# Patient Record
Sex: Female | Born: 1980
Health system: Southern US, Community
[De-identification: ages and names within clinical notes are randomized; demographics above are authoritative.]

## PROBLEM LIST (undated history)

## (undated) ENCOUNTER — Inpatient Hospital Stay (HOSPITAL_COMMUNITY): Payer: Self-pay

## (undated) DIAGNOSIS — I2690 Septic pulmonary embolism without acute cor pulmonale: Secondary | ICD-10-CM

## (undated) DIAGNOSIS — F32A Depression, unspecified: Secondary | ICD-10-CM

## (undated) DIAGNOSIS — N920 Excessive and frequent menstruation with regular cycle: Secondary | ICD-10-CM

## (undated) DIAGNOSIS — F4310 Post-traumatic stress disorder, unspecified: Secondary | ICD-10-CM

## (undated) DIAGNOSIS — C73 Malignant neoplasm of thyroid gland: Secondary | ICD-10-CM

## (undated) DIAGNOSIS — E890 Postprocedural hypothyroidism: Secondary | ICD-10-CM

## (undated) DIAGNOSIS — F17200 Nicotine dependence, unspecified, uncomplicated: Secondary | ICD-10-CM

## (undated) DIAGNOSIS — F1910 Other psychoactive substance abuse, uncomplicated: Secondary | ICD-10-CM

## (undated) DIAGNOSIS — Z9889 Other specified postprocedural states: Secondary | ICD-10-CM

## (undated) DIAGNOSIS — F329 Major depressive disorder, single episode, unspecified: Secondary | ICD-10-CM

## (undated) DIAGNOSIS — F319 Bipolar disorder, unspecified: Secondary | ICD-10-CM

## (undated) DIAGNOSIS — F411 Generalized anxiety disorder: Secondary | ICD-10-CM

## (undated) DIAGNOSIS — A4902 Methicillin resistant Staphylococcus aureus infection, unspecified site: Secondary | ICD-10-CM

## (undated) HISTORY — DX: Post-traumatic stress disorder, unspecified: F43.10

## (undated) HISTORY — DX: Major depressive disorder, single episode, unspecified: F32.9

## (undated) HISTORY — DX: Bipolar disorder, unspecified: F31.9

## (undated) HISTORY — DX: Postprocedural hypothyroidism: E89.0

## (undated) HISTORY — DX: Generalized anxiety disorder: F41.1

## (undated) HISTORY — DX: Other specified postprocedural states: Z98.890

## (undated) HISTORY — DX: Nicotine dependence, unspecified, uncomplicated: F17.200

## (undated) HISTORY — DX: Excessive and frequent menstruation with regular cycle: N92.0

## (undated) HISTORY — DX: Depression, unspecified: F32.A

## (undated) HISTORY — DX: Malignant neoplasm of thyroid gland: C73

---
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
## 2003-06-28 ENCOUNTER — Inpatient Hospital Stay (HOSPITAL_COMMUNITY): Admission: AD | Admit: 2003-06-28 | Discharge: 2003-06-28 | Payer: Self-pay | Admitting: Obstetrics and Gynecology

## 2003-09-04 ENCOUNTER — Inpatient Hospital Stay (HOSPITAL_COMMUNITY): Admission: AD | Admit: 2003-09-04 | Discharge: 2003-09-04 | Payer: Self-pay | Admitting: Obstetrics and Gynecology

## 2003-09-09 ENCOUNTER — Inpatient Hospital Stay (HOSPITAL_COMMUNITY): Admission: AD | Admit: 2003-09-09 | Discharge: 2003-09-12 | Payer: Self-pay | Admitting: Obstetrics and Gynecology

## 2003-10-26 ENCOUNTER — Other Ambulatory Visit: Admission: RE | Admit: 2003-10-26 | Discharge: 2003-10-26 | Payer: Self-pay | Admitting: Obstetrics and Gynecology

## 2004-06-20 ENCOUNTER — Ambulatory Visit (HOSPITAL_COMMUNITY): Admission: RE | Admit: 2004-06-20 | Discharge: 2004-06-20 | Payer: Self-pay | Admitting: Family Medicine

## 2004-06-20 IMAGING — CR DG THORACOLUMBAR SPINE 2V
1 series · 1 of 1 positions shown · non-contrast
Comparison: none

CLINICAL DATA: Upper back pain.
 THORACOLUMBAR SPINE ? [DATE]
 Scoliosis is noted of the thoracolumbar spine.  No acute bony abnormality.
 IMPRESSION
 Scoliosis of the thoracolumbar spine.

[view not recorded]
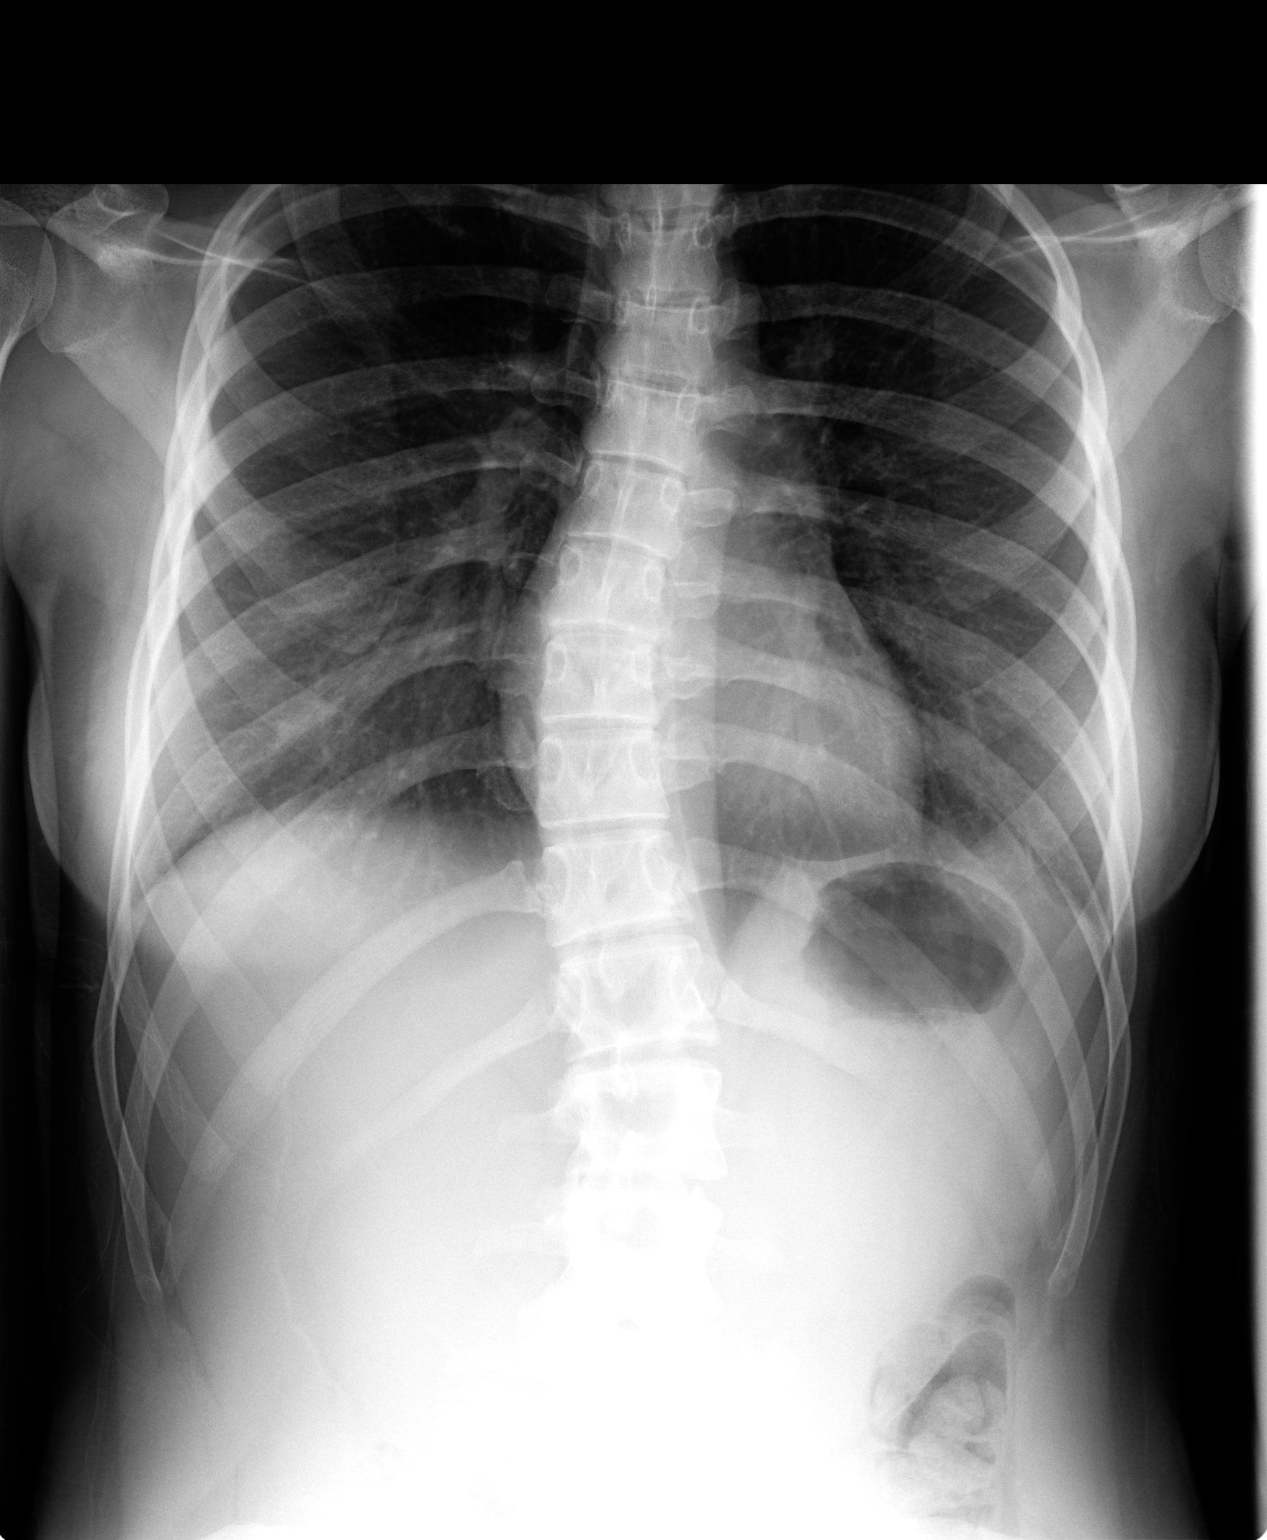

[1 of 1 positions shown; findings below may reference images not displayed]

## 2004-07-31 ENCOUNTER — Other Ambulatory Visit: Admission: RE | Admit: 2004-07-31 | Discharge: 2004-07-31 | Payer: Self-pay | Admitting: Family Medicine

## 2004-09-10 ENCOUNTER — Inpatient Hospital Stay (HOSPITAL_COMMUNITY): Admission: AD | Admit: 2004-09-10 | Discharge: 2004-09-10 | Payer: Self-pay | Admitting: Family Medicine

## 2004-09-10 IMAGING — US US OB COMP LESS 14 WK
1 series · 18 of 28 positions shown · non-contrast
Comparison: none

CLINICAL DATA: Rt sided pelvic pain; bleeding and cramping; LMP [DATE]; quantitative beta HCG level of 7
OBSTETRICAL ULTRASOUND WITH TRANSVAGINAL
There is no evidence of an intrauterine gestational sac or endometrial fluid collection.  Endometrium measures approximately 6 mm in thickness.  There is no evidence of fibroids or other uterine abnormality.  
No adnexal masses are identified by either transabdominal or transvaginal sonography.  Both ovaries are seen on transvaginal sonography and are normal in appearance.  There is a tiny amount of free fluid seen in the pelvic cul-de-sac and right adnexa.
IMPRESSION
No sonographic evidence of intrauterine gestation.  No adnexal mass is identified, and tiny amount of free fluid noted.   Differential considerations include a recent spontaneous abortion, early IUP, or an occult ectopic pregnancy.  Follow-up of quantitative beta HCGs is recommended, as well as follow-up ultrasound if necessary.

[Series 1: us ob comp<14 wk · 18 of 35 slices shown]
[im 1/35]
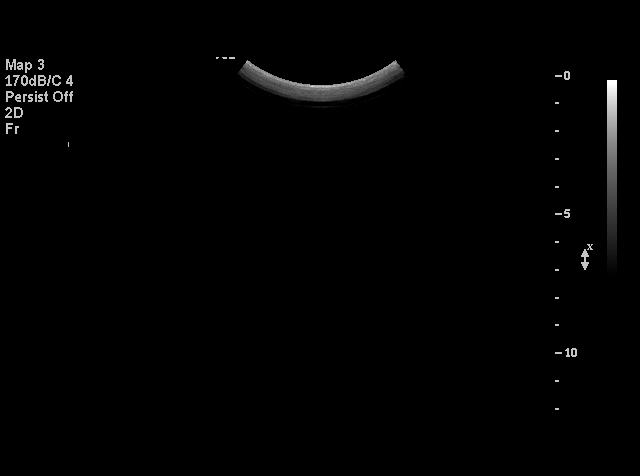
[im 3/35]
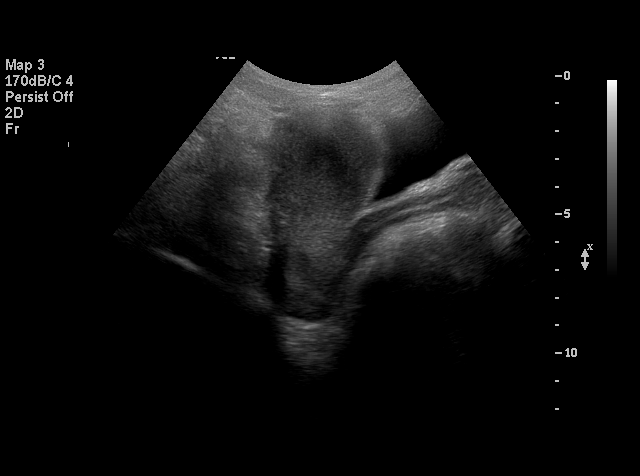
[im 4/35]
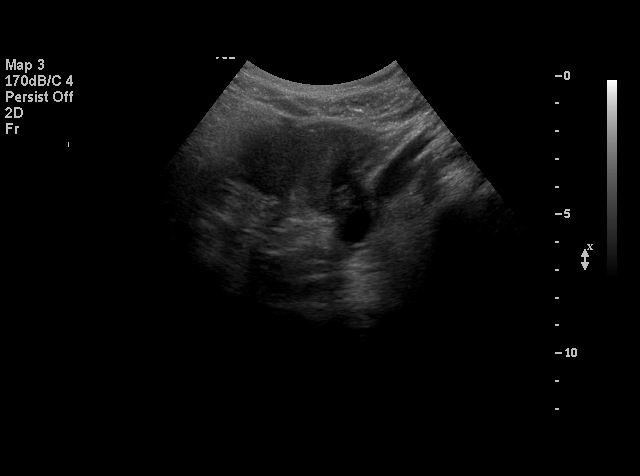
[im 7/35]
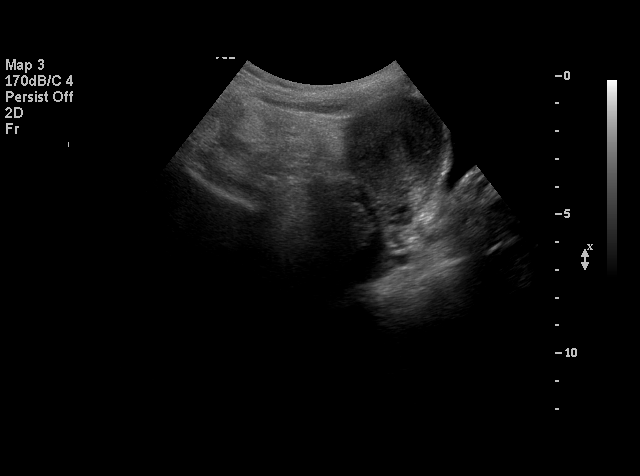
[im 9/35]
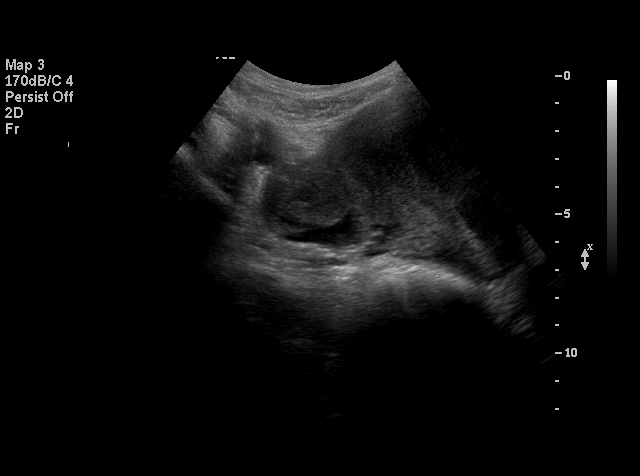
[im 11/35]
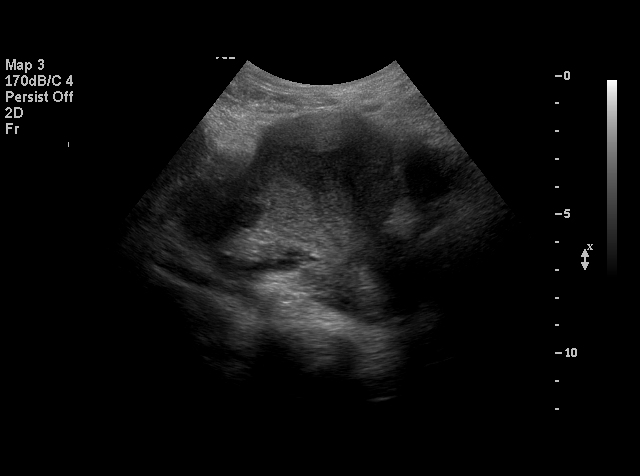
[im 13/35]
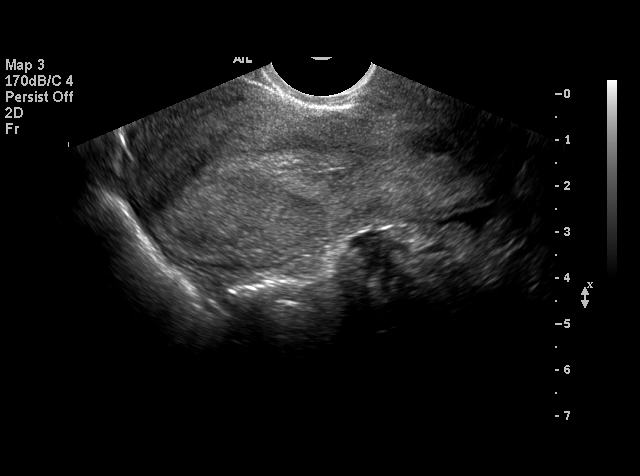
[im 14/35]
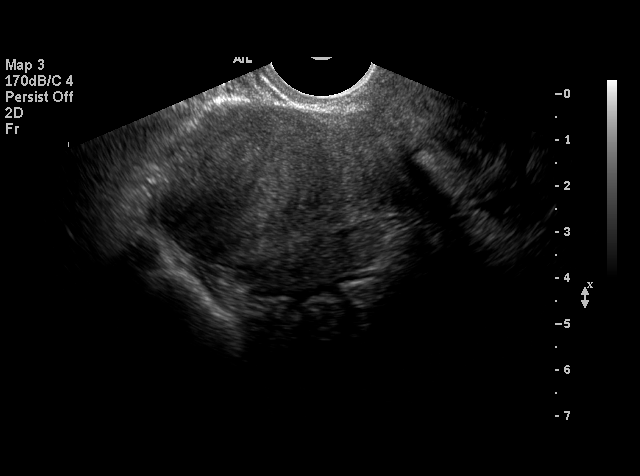
[im 17/35]
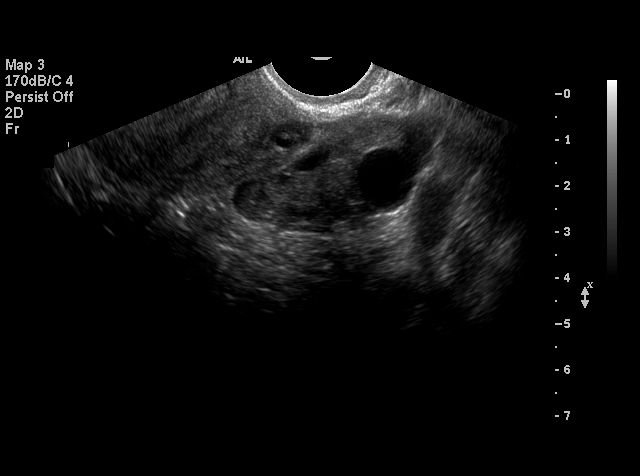
[im 18/35]
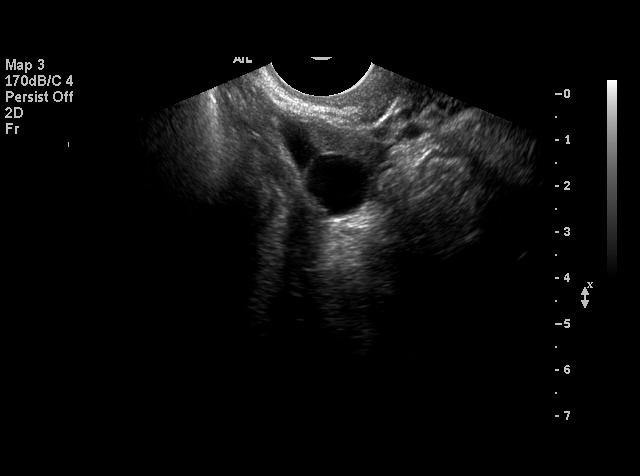
[im 21/35]
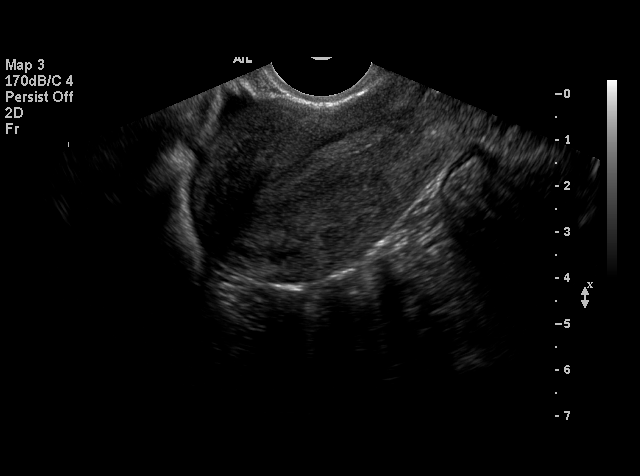
[im 22/35]
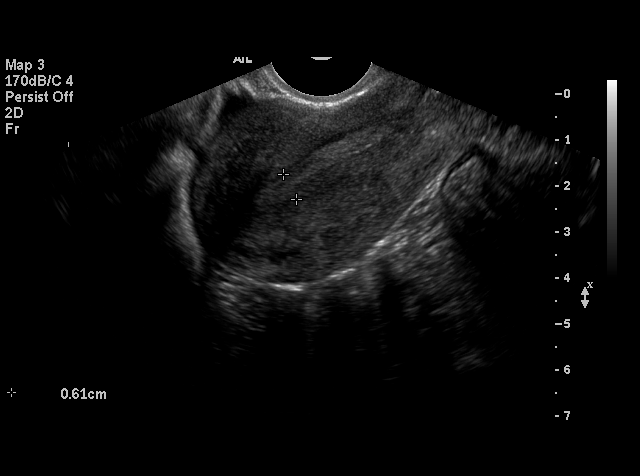
[im 24/35]
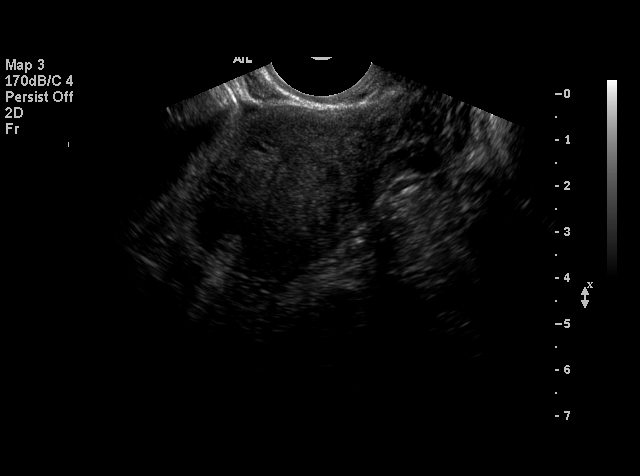
[im 27/35]
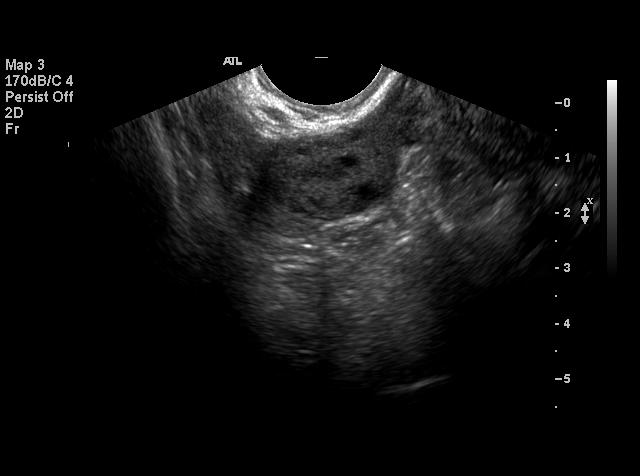
[im 28/35]
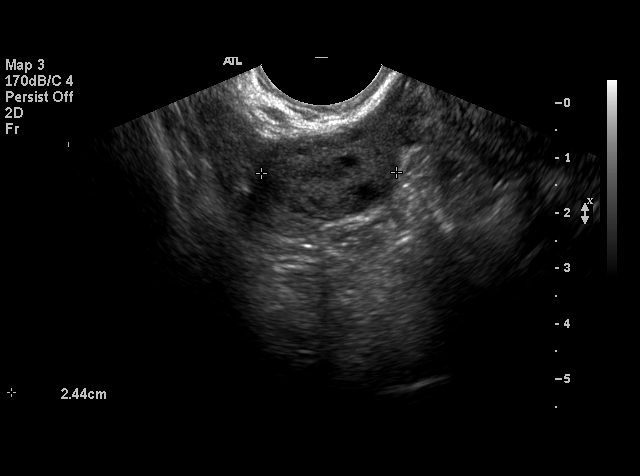
[im 31/35]
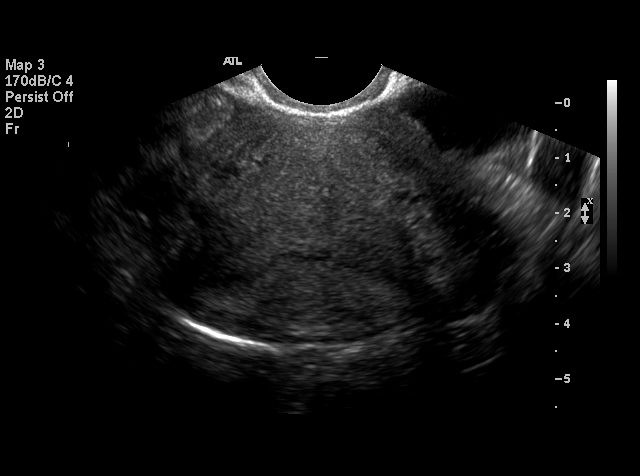
[im 32/35]
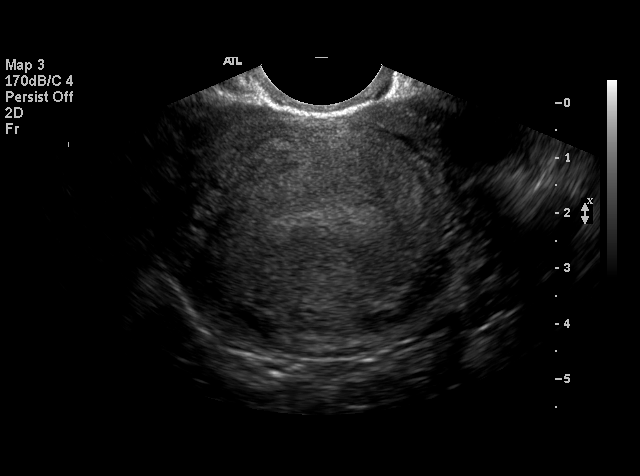
[im 35/35]
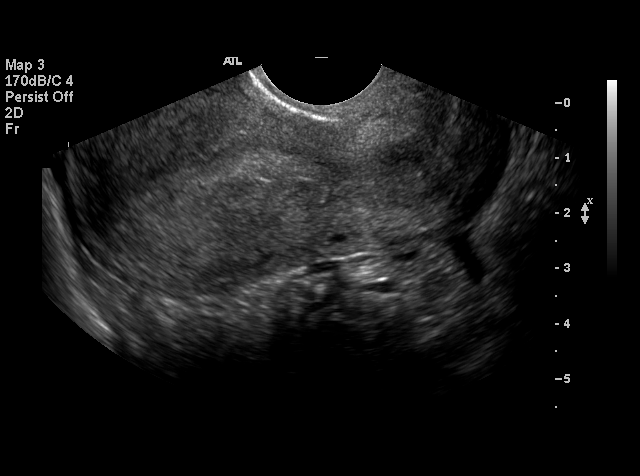

[18 of 28 positions shown; findings below may reference images not displayed]

## 2005-06-01 ENCOUNTER — Other Ambulatory Visit: Admission: RE | Admit: 2005-06-01 | Discharge: 2005-06-01 | Payer: Self-pay | Admitting: Family Medicine

## 2005-06-14 ENCOUNTER — Encounter (INDEPENDENT_AMBULATORY_CARE_PROVIDER_SITE_OTHER): Payer: Self-pay | Admitting: *Deleted

## 2005-06-14 ENCOUNTER — Ambulatory Visit (HOSPITAL_COMMUNITY): Admission: RE | Admit: 2005-06-14 | Discharge: 2005-06-14 | Payer: Self-pay | Admitting: General Surgery

## 2005-06-14 IMAGING — US US BIOPSY
1 series · 14 of 25 positions shown · non-contrast
Comparison: none

CLINICAL DATA: Solid left and complex cystic right thyroid nodules. Previous
biopsies at an outside institution were nondiagnostic.

ULTRASOUND GUIDED CORE BIOPSY LEFT THYROID:
TECHNIQUE: Overlying skin prepped with Betadine, draped in usual sterile
fashion, and infiltrated locally with 1% lidocaine. Under real-time ultrasound
guidance, 6 passes were made into the left lower pole nodule using 22 gauge
Temno core needles. Samples were given to  pathology. No immediate complication.
TECHNIQUE: Under real-time ultrasound guidance, approximately 1.5 mL of old
blood were aspirated from the cystic component of the lesion in the lower pole
right lobe. 5 passes were made into the   nodule using 22 gauge Temno core
needle. Samples were given to  pathology. No immediate complication.

[Series 1: unknown · 0.09mm/px · 14 of 28 slices shown]
[im 1/28]
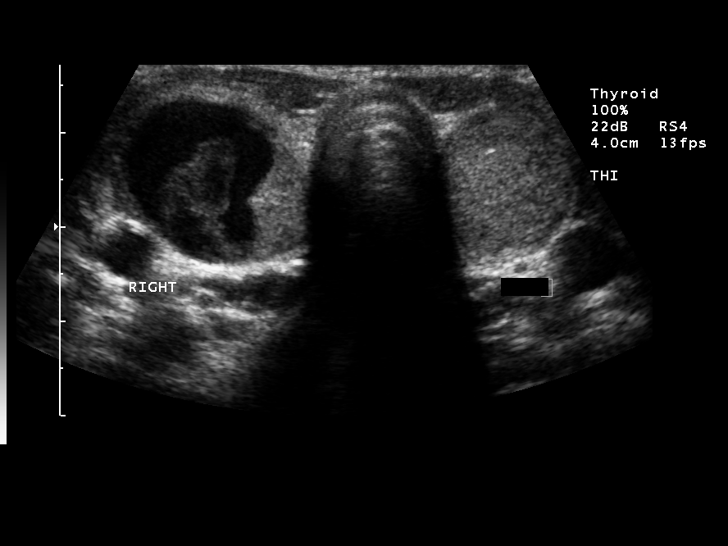
[im 3/28]
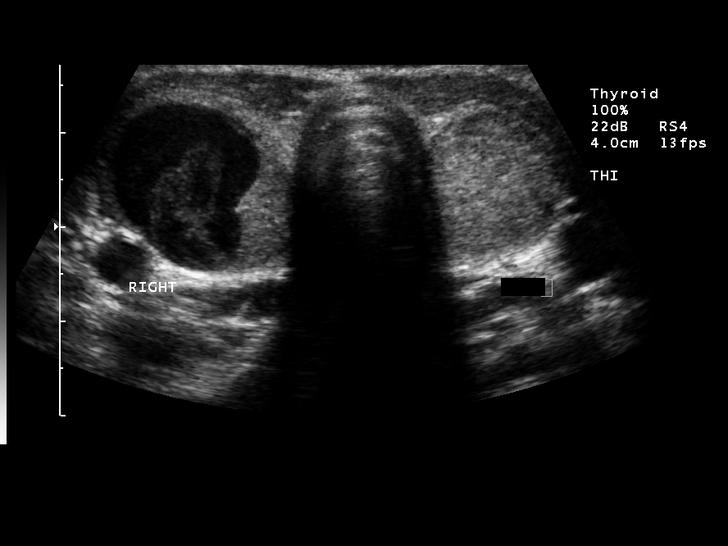
[im 5/28]
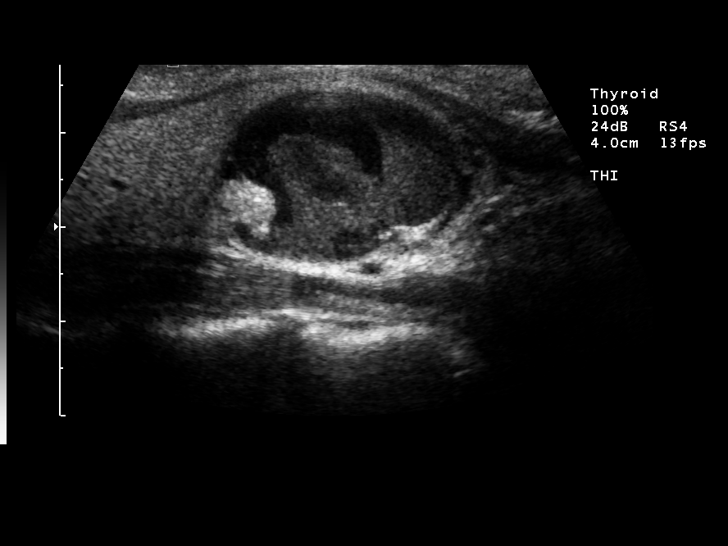
[im 7/28]
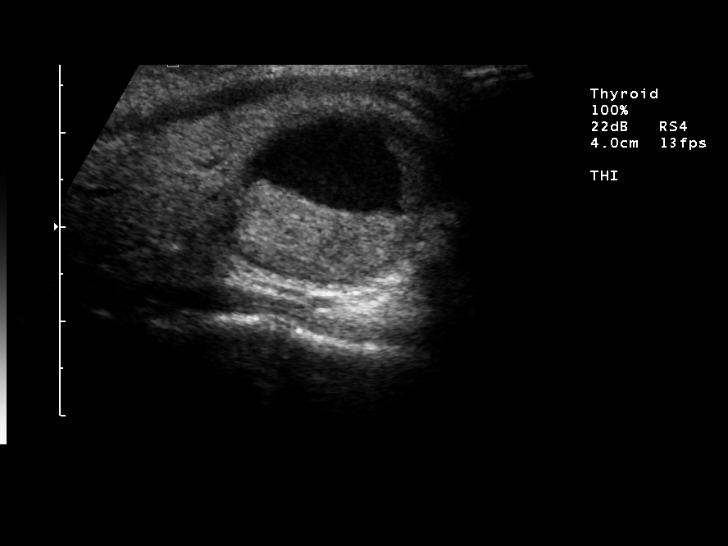
[im 10/28]
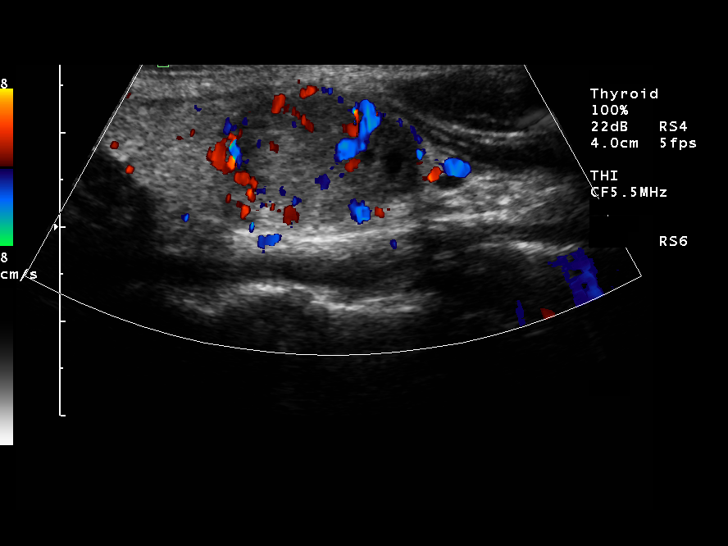
[im 11/28]
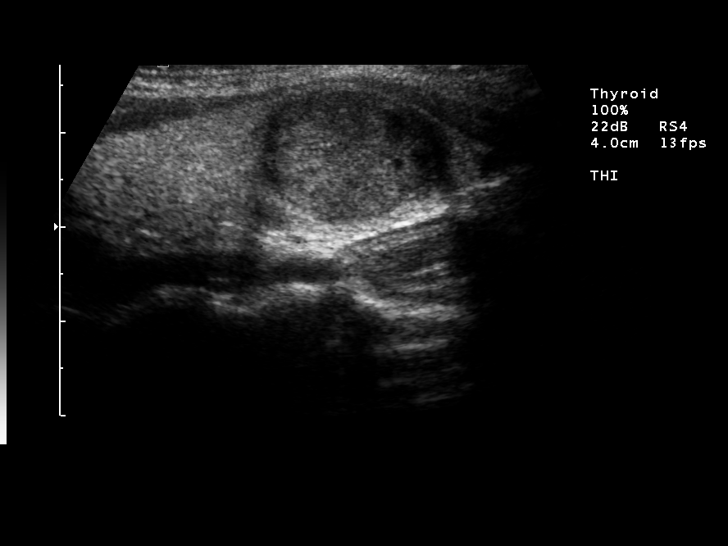
[im 13/28]
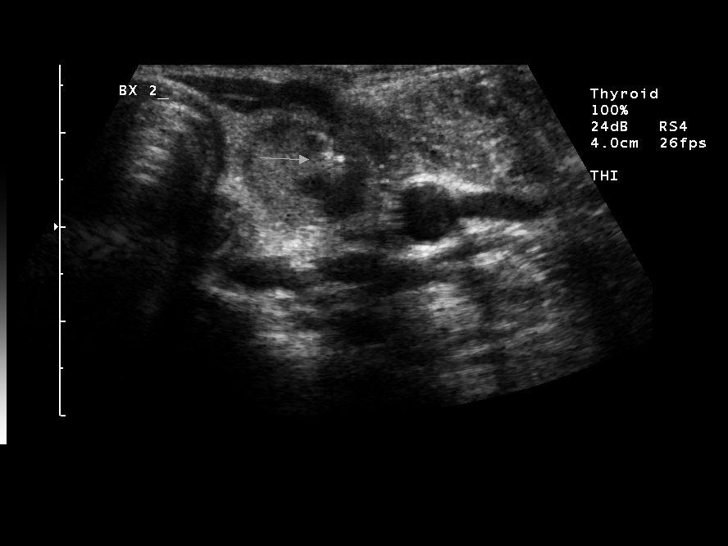
[im 15/28]
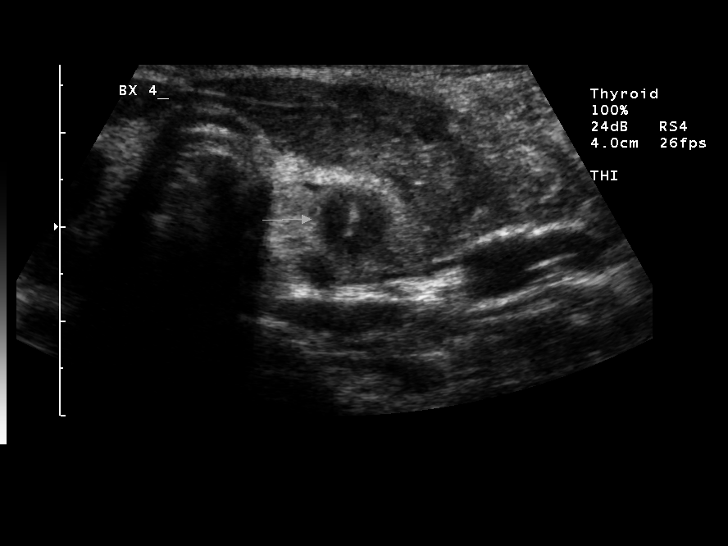
[im 17/28]
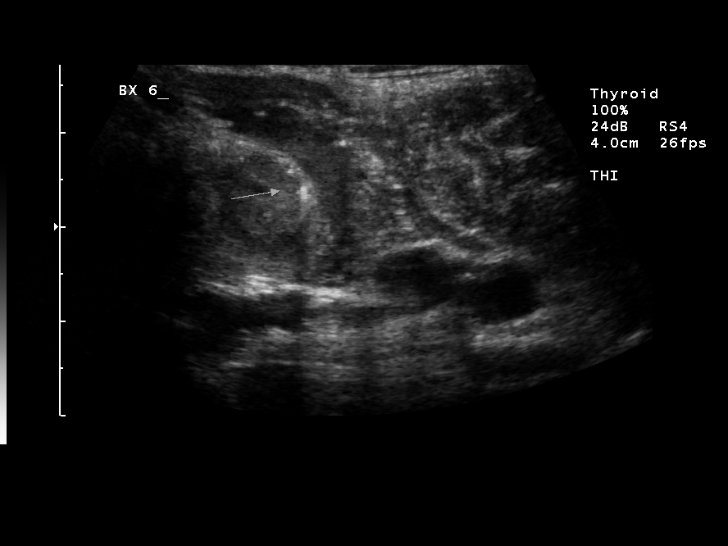
[im 19/28]
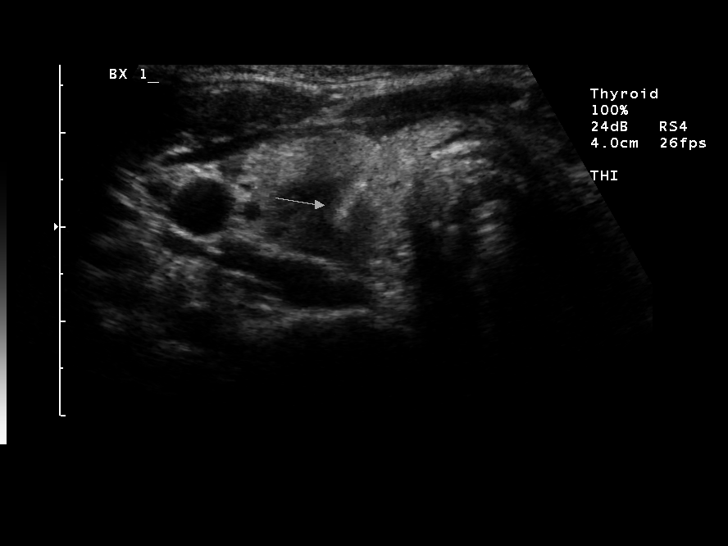
[im 21/28]
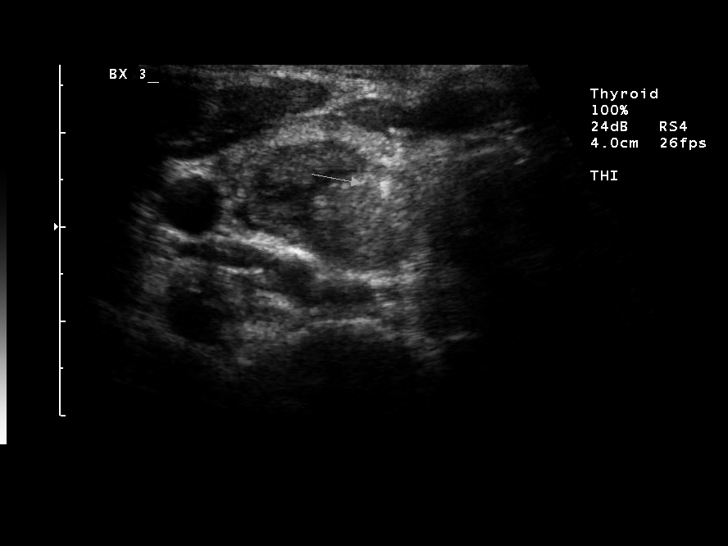
[im 23/28]
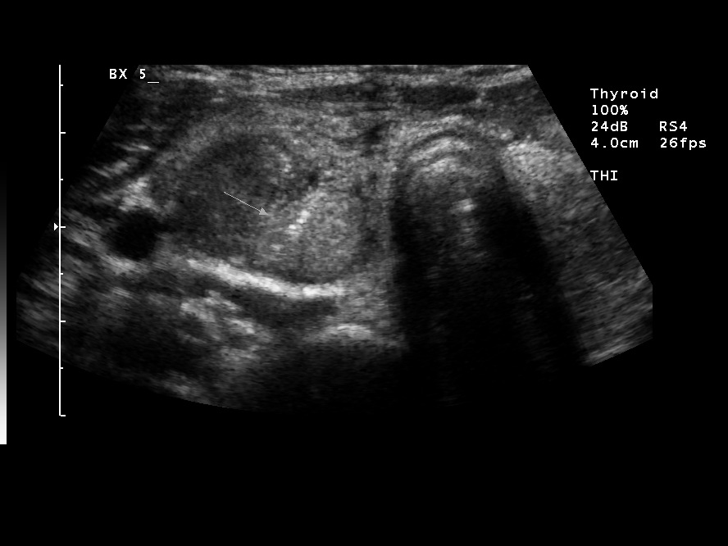
[im 25/28]
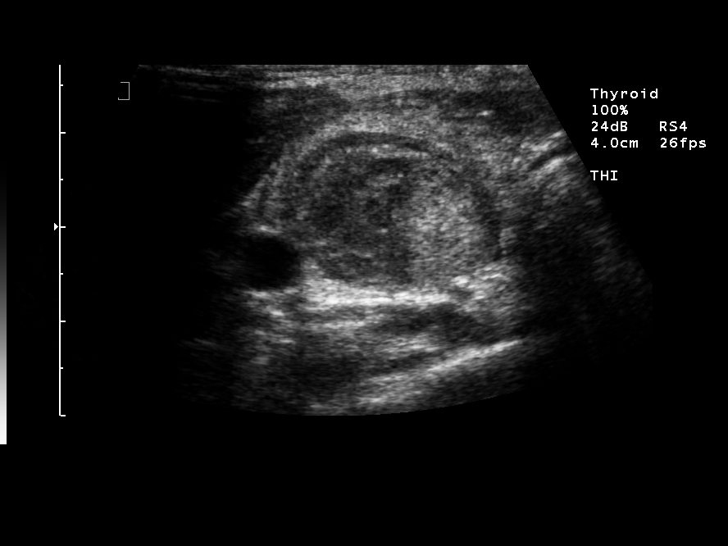
[im 28/28]
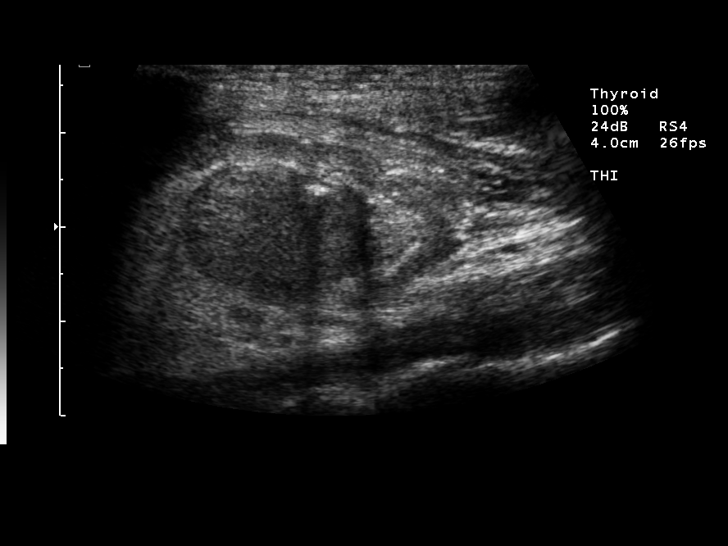

[14 of 25 positions shown; findings below may reference images not displayed]

IMPRESSION: Technically successful ultrasound guided core left thyroid biopsy.

ULTRASOUND GUIDED CORE BIOPSY RIGHT THYROID:
IMPRESSION: Technically successful ultrasound guided aspiration and core right
thyroid biopsy.

## 2005-11-05 ENCOUNTER — Ambulatory Visit (HOSPITAL_COMMUNITY): Admission: RE | Admit: 2005-11-05 | Discharge: 2005-11-05 | Payer: Self-pay | Admitting: General Surgery

## 2005-11-05 IMAGING — US US SOFT TISSUE HEAD/NECK
1 series · 13 of 25 positions shown · non-contrast
Comparison: None.    
The patient reportedly had previous biopsies of the thyroid.

CLINICAL DATA: Followup bilateral thyroid nodules.  
THYROID ULTRASOUND:
TECHNIQUE: Ultrasound examination of the thyroid gland and adjacent soft tissue structures was performed.

[Series 1: unknown · 0.07mm/px · 13 of 51 slices shown]
[im 1/51]
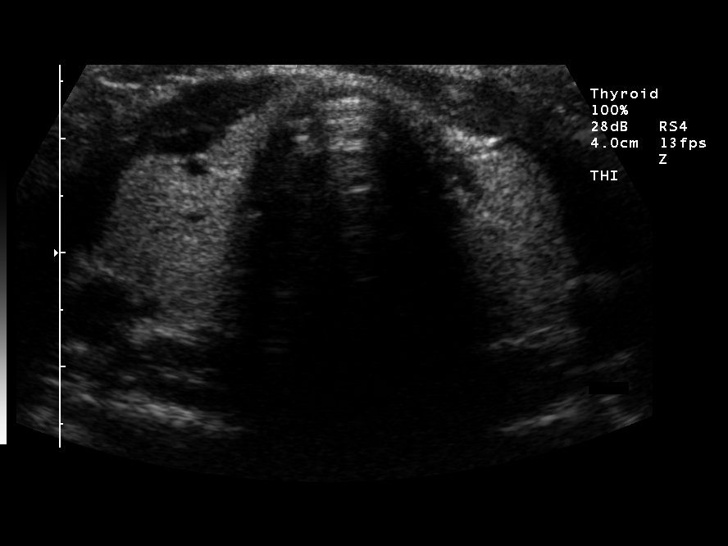
[im 5/51]
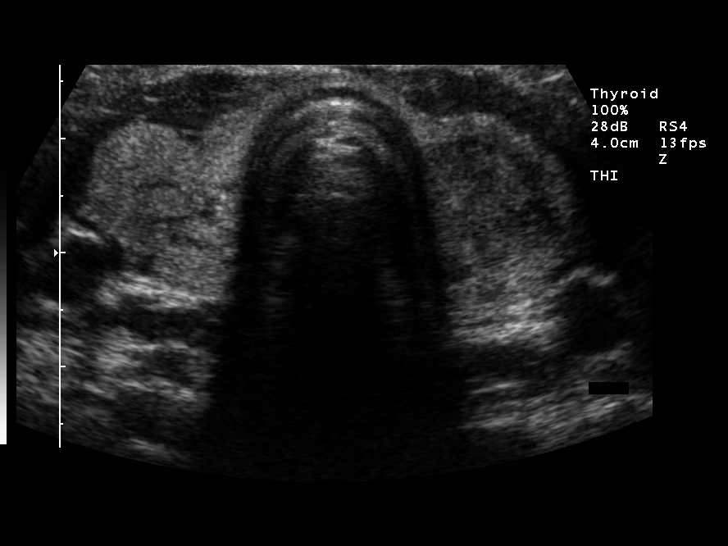
[im 9/51]
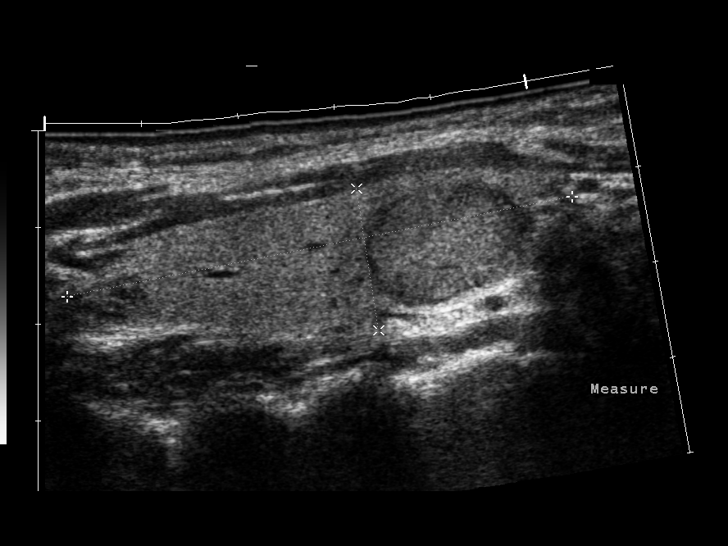
[im 13/51]
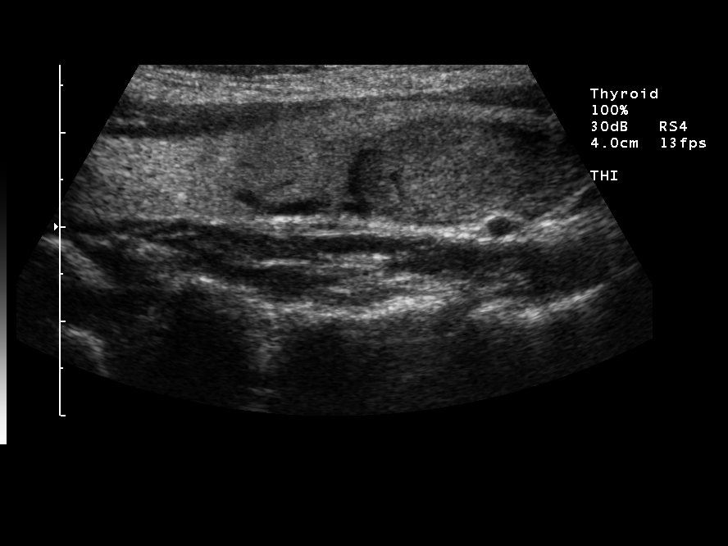
[im 17/51]
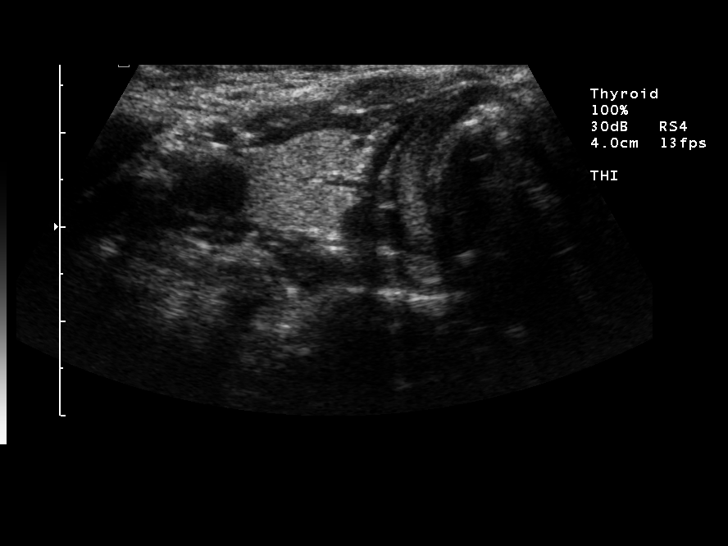
[im 21/51]
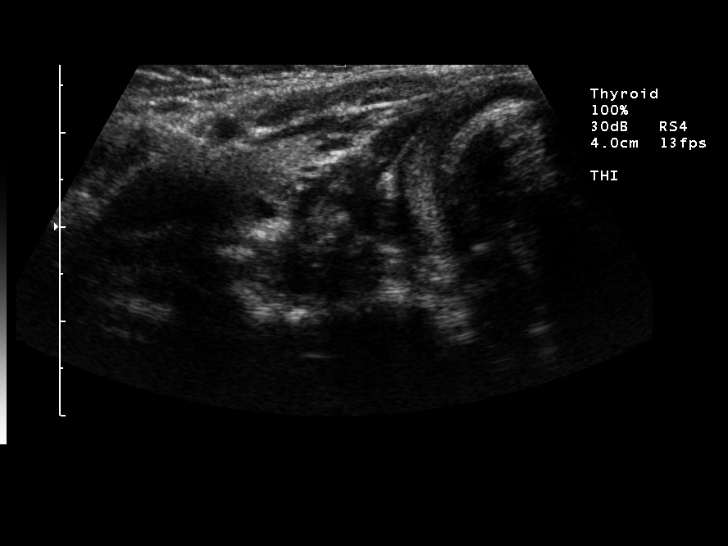
[im 26/51]
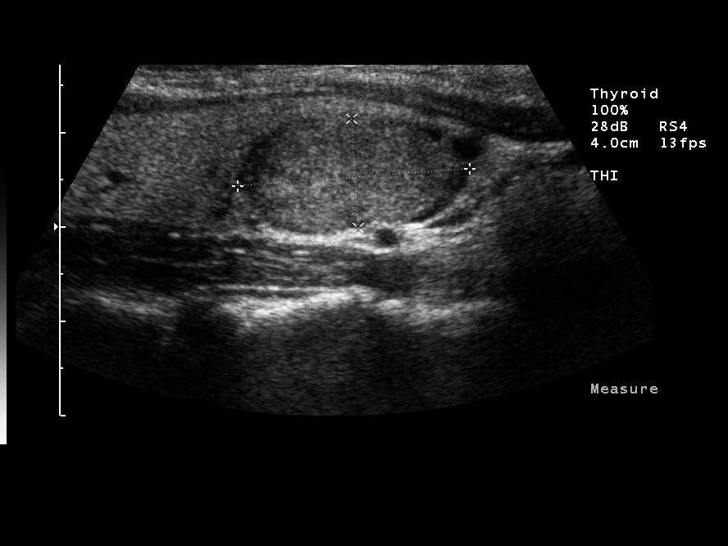
[im 30/51]
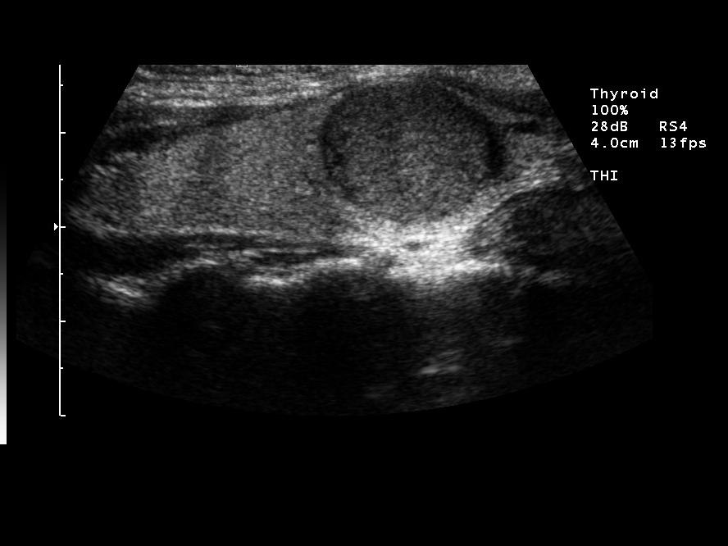
[im 34/51]
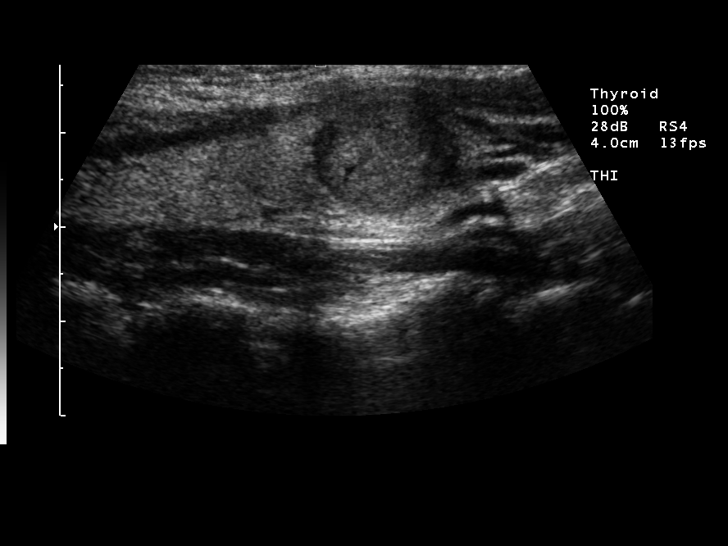
[im 38/51]
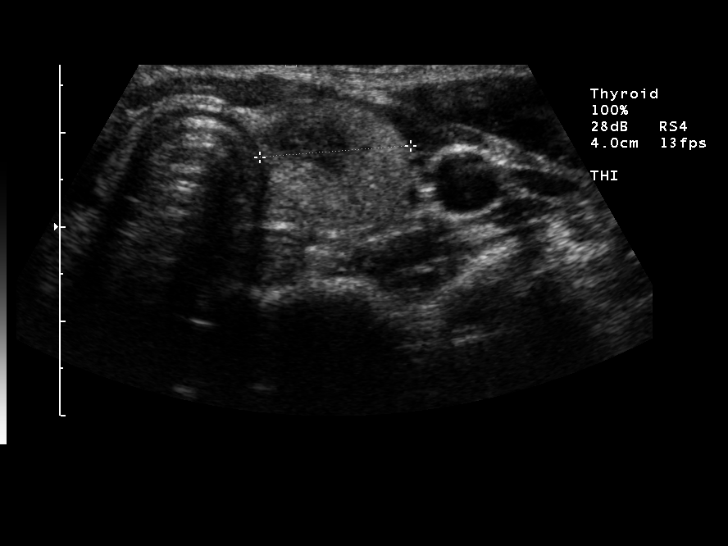
[im 42/51]
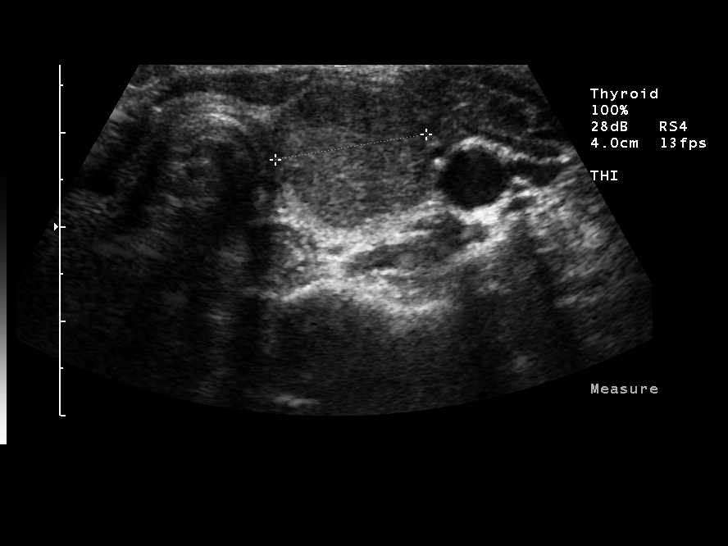
[im 46/51]
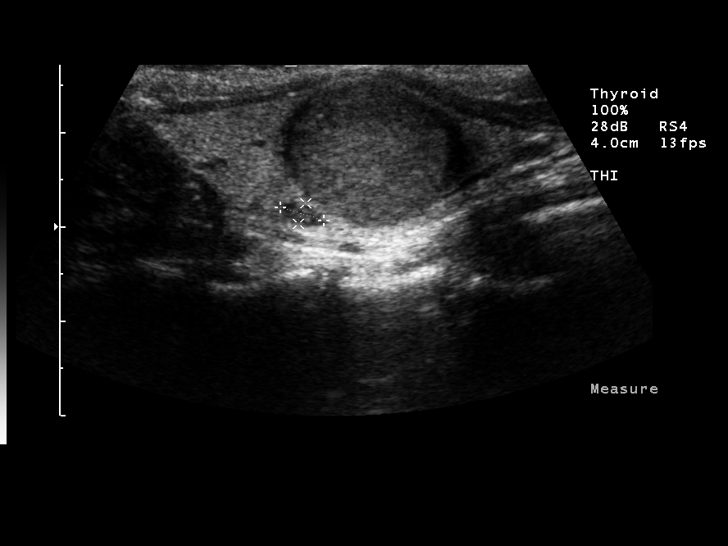
[im 51/51]
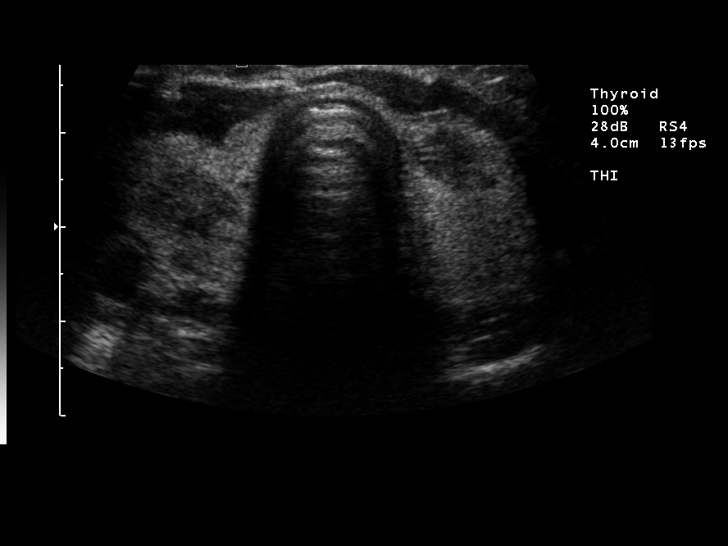

[13 of 25 positions shown; findings below may reference images not displayed]

FINDINGS: Right lobe measures 5.3 x 1.5 x 1.5 cm and the left lobe measures 5.4 x 1.7 x 1.6 cm.  There is a solid 2.5 x 1.2 x 1.4 cm nodule in the lower pole on the right.  In the upper pole on the right, there is a 1 cm mostly solid nodule.  
On the left, there is a 1.8 x 1.5 x 1.6 cm solid lesion with discrete margins.  A 5 mm solid nodule is noted medial to the large nodule in the lower pole on the left.
IMPRESSION: Bilateral thyroid nodules with the largest on the right being 2.5 cm and the largest on the left being 1.8 cm. Both of these nodules have discrete margins and thought to represent capsules.  These may well represent a simple adenoma.  Comparison with the previous biopsy reports would be suggested. 
ADDENDUM:
On [DATE], the patient had a biopsy of the nodule in the lower pole on the right as well as a biopsy of the solid nodule in the lower pole on the left.  There is less evidence of a cystic component to the nodule in the lower pole on the right now  when compared with the previous biopsy ultrasound images.

## 2005-12-25 ENCOUNTER — Inpatient Hospital Stay (HOSPITAL_COMMUNITY): Admission: AD | Admit: 2005-12-25 | Discharge: 2005-12-25 | Payer: Self-pay | Admitting: Obstetrics and Gynecology

## 2006-03-27 ENCOUNTER — Inpatient Hospital Stay (HOSPITAL_COMMUNITY): Admission: AD | Admit: 2006-03-27 | Discharge: 2006-03-30 | Payer: Self-pay | Admitting: Obstetrics and Gynecology

## 2006-07-09 ENCOUNTER — Encounter (HOSPITAL_COMMUNITY): Admission: RE | Admit: 2006-07-09 | Discharge: 2006-09-24 | Payer: Self-pay | Admitting: Endocrinology

## 2006-12-31 DIAGNOSIS — E890 Postprocedural hypothyroidism: Secondary | ICD-10-CM

## 2006-12-31 HISTORY — DX: Postprocedural hypothyroidism: E89.0

## 2007-09-01 HISTORY — PX: THYROIDECTOMY: SHX17

## 2008-01-14 ENCOUNTER — Other Ambulatory Visit: Admission: RE | Admit: 2008-01-14 | Discharge: 2008-01-14 | Payer: Self-pay | Admitting: Family Medicine

## 2008-12-31 HISTORY — PX: CHOLECYSTECTOMY: SHX55

## 2009-07-29 ENCOUNTER — Inpatient Hospital Stay (HOSPITAL_COMMUNITY): Admission: AD | Admit: 2009-07-29 | Discharge: 2009-07-29 | Payer: Self-pay | Admitting: Obstetrics and Gynecology

## 2009-07-31 HISTORY — PX: TRANSTHORACIC ECHOCARDIOGRAM: SHX275

## 2009-09-06 ENCOUNTER — Ambulatory Visit (HOSPITAL_COMMUNITY): Admission: RE | Admit: 2009-09-06 | Discharge: 2009-09-06 | Payer: Self-pay | Admitting: Obstetrics and Gynecology

## 2009-10-09 ENCOUNTER — Inpatient Hospital Stay (HOSPITAL_COMMUNITY): Admission: AD | Admit: 2009-10-09 | Discharge: 2009-10-09 | Payer: Self-pay | Admitting: Obstetrics and Gynecology

## 2009-10-22 ENCOUNTER — Inpatient Hospital Stay (HOSPITAL_COMMUNITY): Admission: AD | Admit: 2009-10-22 | Discharge: 2009-10-23 | Payer: Self-pay | Admitting: Obstetrics and Gynecology

## 2009-10-23 ENCOUNTER — Inpatient Hospital Stay (HOSPITAL_COMMUNITY): Admission: AD | Admit: 2009-10-23 | Discharge: 2009-10-23 | Payer: Self-pay | Admitting: Obstetrics and Gynecology

## 2009-11-07 ENCOUNTER — Inpatient Hospital Stay (HOSPITAL_COMMUNITY): Admission: AD | Admit: 2009-11-07 | Discharge: 2009-11-07 | Payer: Self-pay | Admitting: Obstetrics and Gynecology

## 2009-11-14 ENCOUNTER — Inpatient Hospital Stay (HOSPITAL_COMMUNITY): Admission: AD | Admit: 2009-11-14 | Discharge: 2009-11-15 | Payer: Self-pay | Admitting: Obstetrics and Gynecology

## 2009-12-31 DIAGNOSIS — F319 Bipolar disorder, unspecified: Secondary | ICD-10-CM

## 2009-12-31 HISTORY — DX: Bipolar disorder, unspecified: F31.9

## 2010-02-01 ENCOUNTER — Other Ambulatory Visit: Admission: RE | Admit: 2010-02-01 | Discharge: 2010-02-01 | Payer: Self-pay | Admitting: Family Medicine

## 2010-02-01 LAB — CBC WITH DIFFERENTIAL/PLATELET
BASO%: 1 %
BASO%: 1 %
Basophils Absolute: 0 /uL
EOS%: 1 %
EOS%: 1 %
EOS%: 1 %
EOS: 0 %
EOS: 0 %
Eosinophils Absolute: 0 /uL
HCT: 39 %
HCT: 39 %
HGB: 13.2 g/dL
HGB: 13.2 g/dL
HGB: 13.2 g/dL
Hematocrit: 38.7 % (ref 34.8–46)
LYMPH%: 26 %
MCHC: 34.1
MCHC: 34.1
MCV: 90.1 fL (ref 78–100)
MCV: 90.1 fL (ref 78–100)
MONO#: 0.6
MONO#: 0.6
MONO%: 9 %
MONO%: 9 %
NEUT#: 3.9
NEUT#: 3.9
NEUT#: 3.9
NEUT%: 63 %
Platelets: 229 10*3/uL
RBC: 4.29
RDW: 12
RDW: 12
RDW: 12
WBC: 6.2
WBC: 6.2
lymph#: 1.6
lymph#: 1.6
lymph#: 1.6

## 2010-02-01 LAB — GENERAL HEALTH PANEL
ALT: 32 U/L (ref 7–35)
AST: 22 U/L
Albumin: 4.3
Anion gap: 11.1
BUN, Bld: 13
CO2: 27 mmol/L
Chol/HDL Ratio: 4.09
Direct LDL: 112
GFR, Est African American: 121.46
Glucose: 93
Potassium: 4.1 mmol/L
Sodium: 137 mmol/L (ref 137–147)
T4, Free Direct Dialysis: 1.24
TSH: 0.08
Thyroglobulin, Qn.: 0.6
Total Bilirubin: 0.3 mg/dL

## 2010-02-01 LAB — SPECIFIC GRAVITY (REFLEXED)
Blood: NEGATIVE
Ketones: NEGATIVE
Nitrite: NEGATIVE
pH: 5

## 2010-02-01 LAB — URINALYSIS, DIPSTICK ONLY
Glucose, Ur: NEGATIVE
Ketones, urine: NEGATIVE
Nitrite Urine, Quantitative: NEGATIVE
Urobilinogen, UA: NORMAL

## 2010-02-01 LAB — URINE CULTURE, REFLEX
Bilirubin (Urine): NEGATIVE
Glucose: NEGATIVE
SPECIFIC GRAVITY: 1.01
Total Protein: 0 g/dL
pH: 5

## 2010-02-01 LAB — LIPID PANEL
ALP, Heat Stable (Liver): 74
AST: 22 U/L
Anion gap: 11.1
BUN: 13 mg/dL (ref 4–21)
CO2: 27 mmol/L
Calcium: 9.4 mg/dL
Chloride: 103 mmol/L
Chol/HDL Ratio: 4.09
Cholesterol: 188 mg/dL (ref 0–200)
Direct LDL: 112
GFR, Est African American: 121.46
Glucose: 93
Protein: 7.1
Total HDL-C Direct: 46
Triglycerides: 134

## 2010-02-12 ENCOUNTER — Emergency Department (HOSPITAL_COMMUNITY): Admission: EM | Admit: 2010-02-12 | Discharge: 2010-02-12 | Payer: Self-pay | Admitting: Emergency Medicine

## 2010-02-12 IMAGING — US US ABDOMEN COMPLETE
1 series · 13 of 25 positions shown · non-contrast
Comparison: None

CLINICAL DATA: Abdominal pain.

COMPLETE ABDOMINAL ULTRASOUND

[Series 1: us abdomen complete · 0.24mm/px · 13 of 59 slices shown]
[im 1/59]
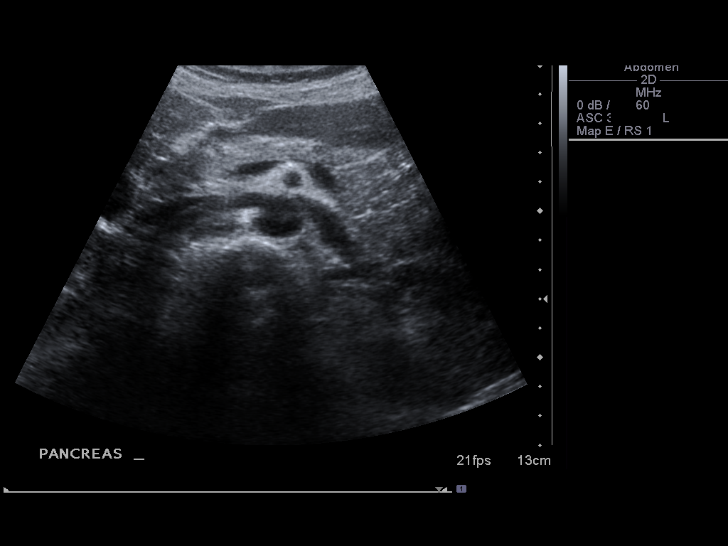
[im 5/59]
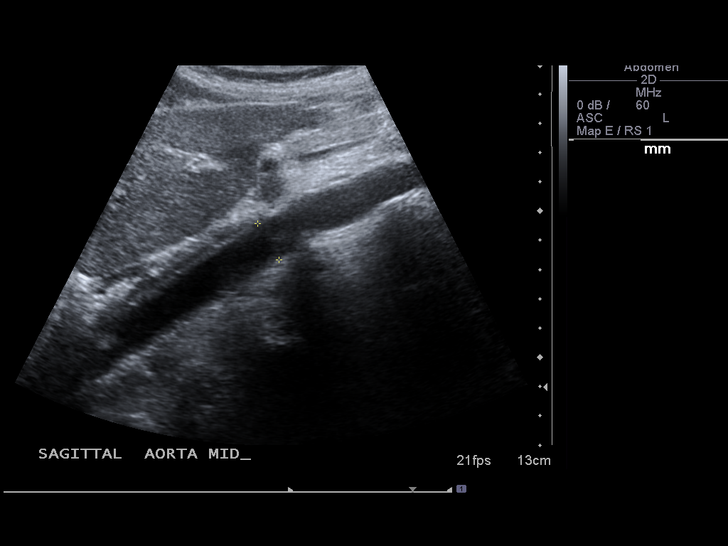
[im 10/59]
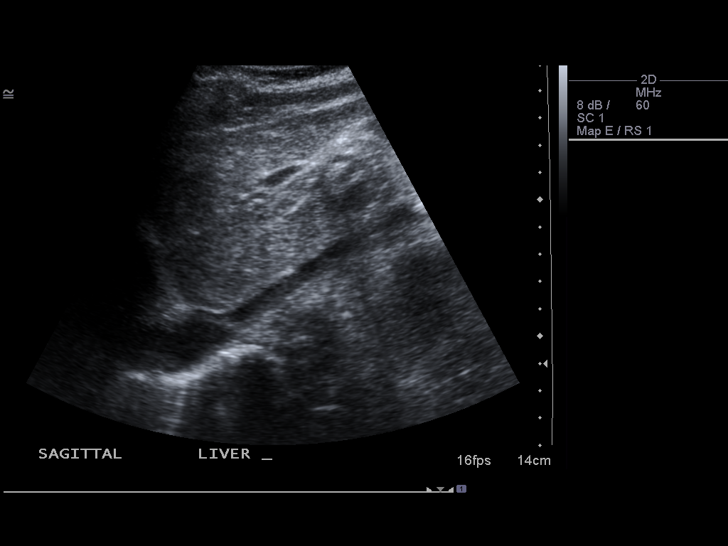
[im 15/59]
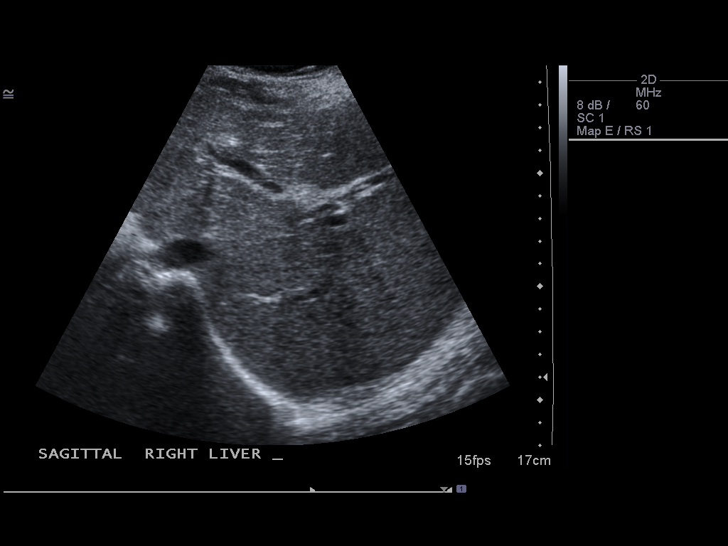
[im 20/59]
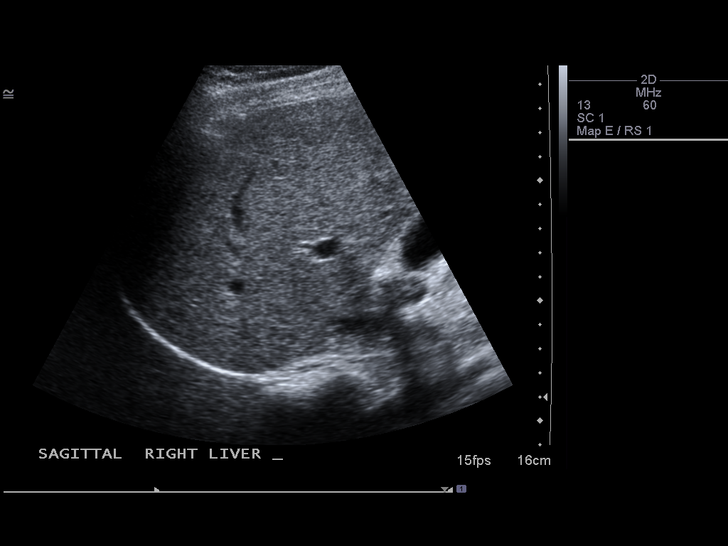
[im 25/59]
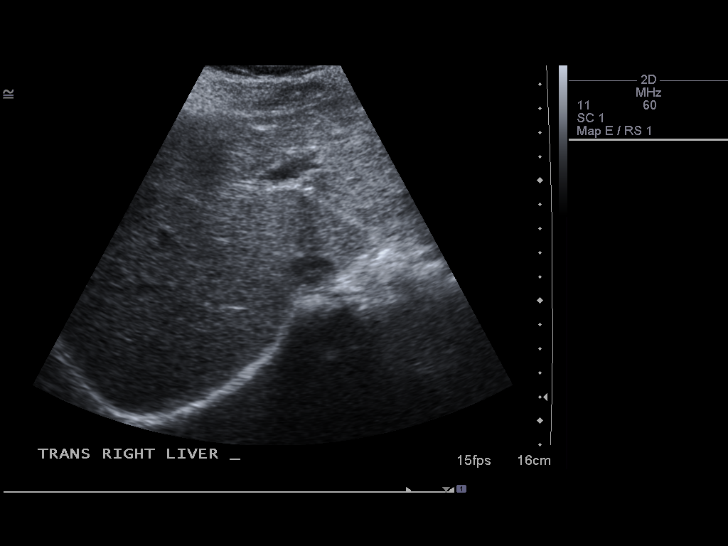
[im 30/59]
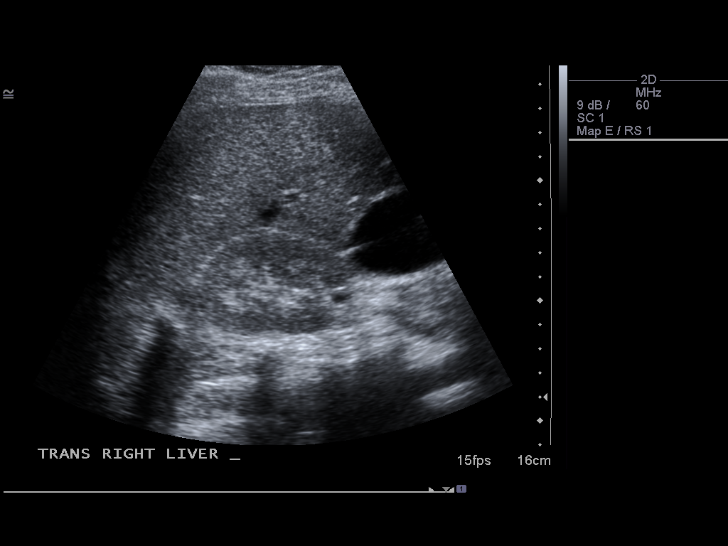
[im 34/59]
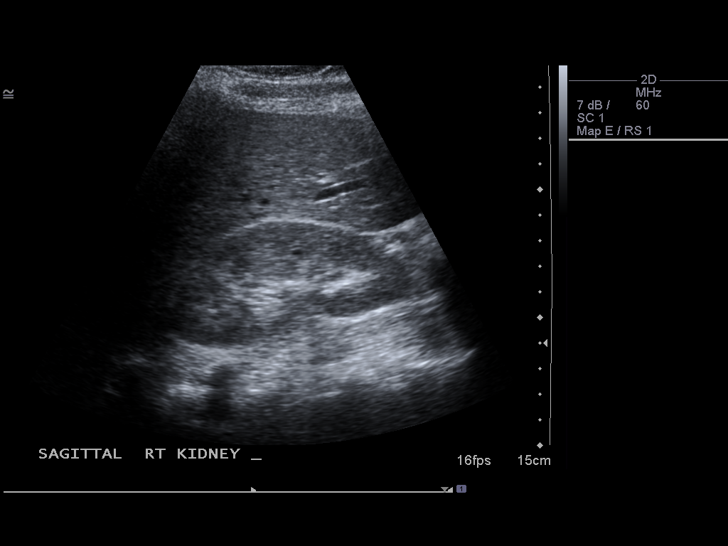
[im 39/59]
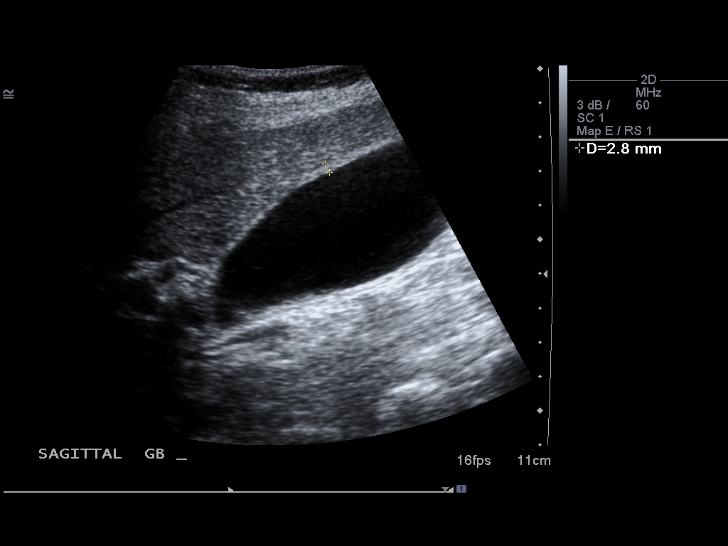
[im 44/59]
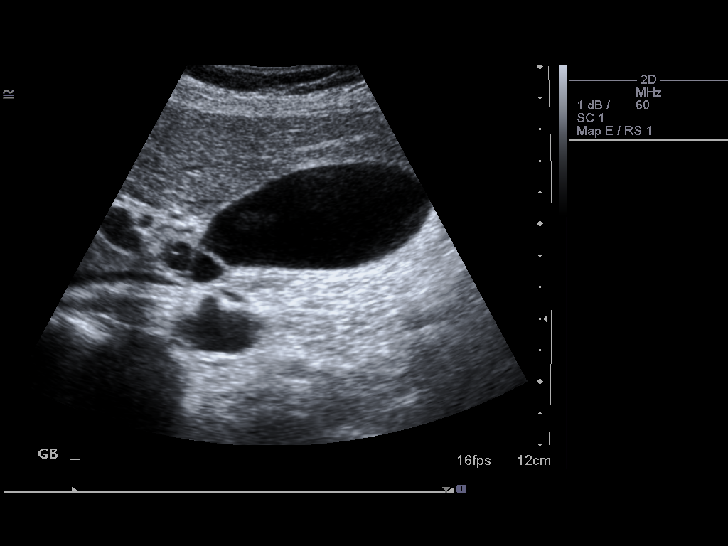
[im 49/59]
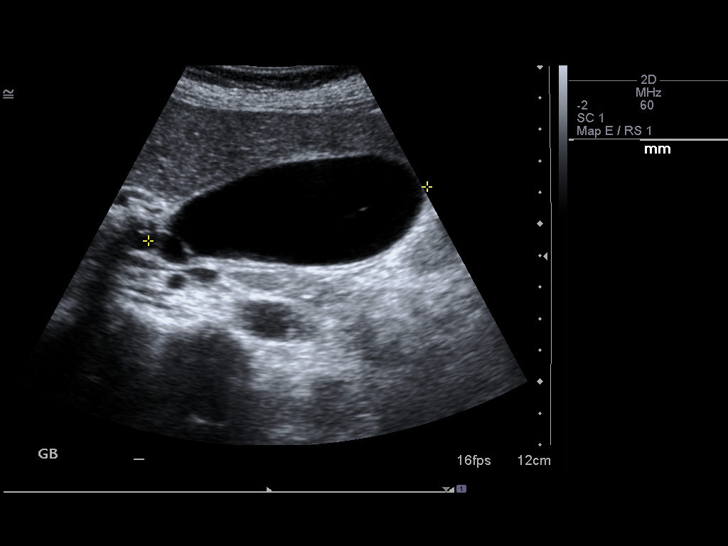
[im 54/59]
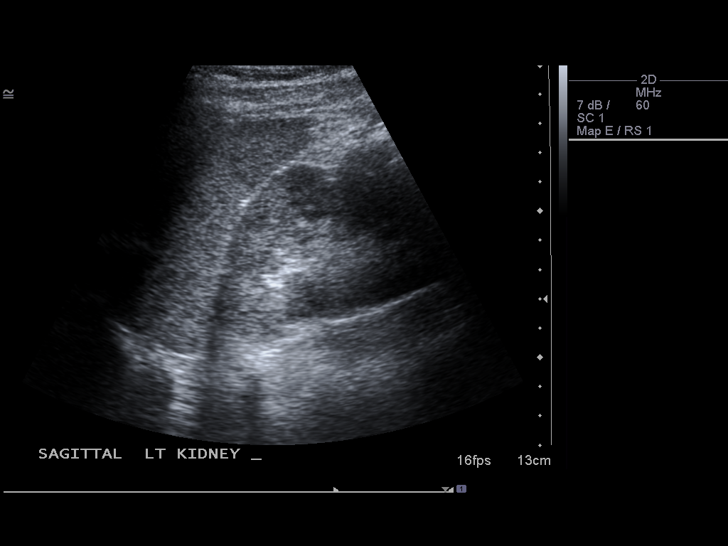
[im 59/59]
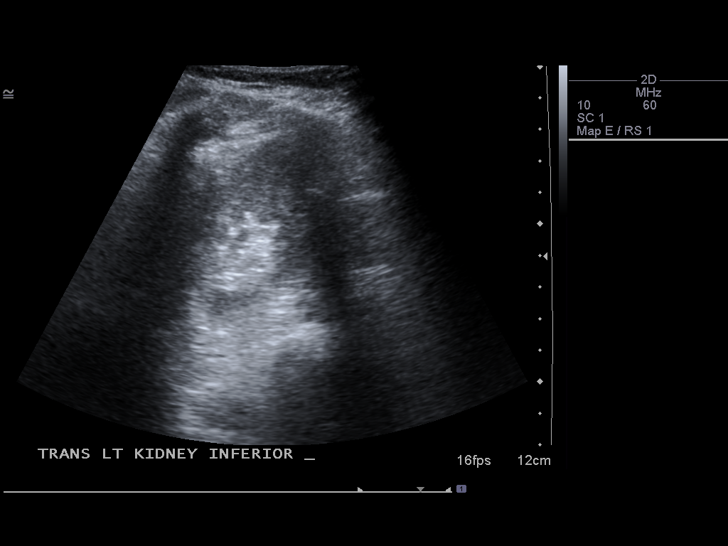

[13 of 25 positions shown; findings below may reference images not displayed]

FINDINGS: Gallbladder:  There is a small amount of layering echogenic debris
in the gallbladder which may be sludge or tiny nonshadowing
gallstones.  No wall thickening, pericholecystic fluid or
sonographic Murphy's sign.

Common bile duct:  Normal in caliber measuring a maximum of 3.8 mm.

Liver:  No focal lesion identified.  Within normal limits in
parenchymal echogenicity.

IVC:  Normal in caliber.

Pancreas:  Normal sonographic appearance.

Spleen:  Normal in size.  Slight increased echogenicity appears
artifactual.  No focal lesions.

Right Kidney:  10.4 cm in length. Normal renal cortical thickness
and echogenicity without focal lesions or hydronephrosis.

Left Kidney:  10.5 cm in length. Normal renal cortical thickness
and echogenicity without focal lesions or hydronephrosis.

Abdominal aorta:  Normal caliber.
IMPRESSION: 1.  Small amount of layering echogenic debris in the gallbladder
may be sludge or tiny nonshadowing gallstones.
2.  No sonographic findings for acute cholecystitis.
3.  Normal caliber common bile duct.
4.  Unremarkable sonographic appearance of the liver, pancreas,
spleen and both kidneys.

## 2010-06-02 LAB — TSH
Antithyroglobulin Ab: <20
TSH: 0.742
TSH: 0.742
Thyroglobulin, Qn.: 0.7
Thyroglobulin: 0.7

## 2010-06-23 ENCOUNTER — Emergency Department (HOSPITAL_BASED_OUTPATIENT_CLINIC_OR_DEPARTMENT_OTHER): Admission: EM | Admit: 2010-06-23 | Discharge: 2010-06-23 | Payer: Self-pay | Admitting: Emergency Medicine

## 2010-06-24 ENCOUNTER — Ambulatory Visit: Payer: Self-pay | Admitting: Diagnostic Radiology

## 2010-06-24 ENCOUNTER — Emergency Department (HOSPITAL_BASED_OUTPATIENT_CLINIC_OR_DEPARTMENT_OTHER): Admission: EM | Admit: 2010-06-24 | Discharge: 2010-06-24 | Payer: Self-pay | Admitting: Emergency Medicine

## 2010-06-24 IMAGING — CT CT HEAD W/O CM
1 series · 16 of 30 positions shown, 20 images · non-contrast
Comparison: None

CLINICAL DATA: Right sided headache.  Nausea vomiting.

CT HEAD WITHOUT CONTRAST
TECHNIQUE: Contiguous axial images were obtained from the base of
the skull through the vertex without contrast

[Series 2: head 4.8 h37s · axial · 0.41mm/px · z∈[-174,-41]mm · 16 of 32 slices shown, 20 images]
[im 2/32  brain]
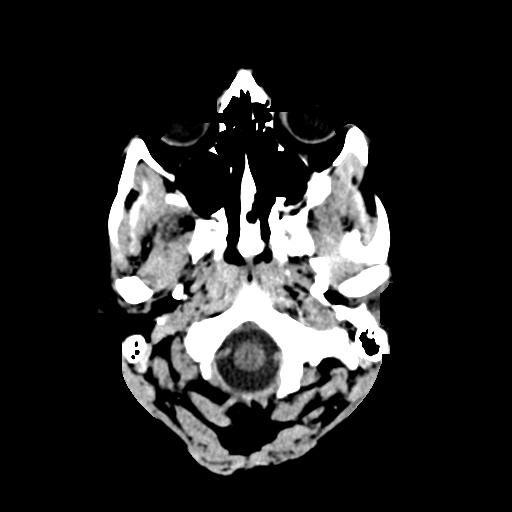
[im 2/32  bone]
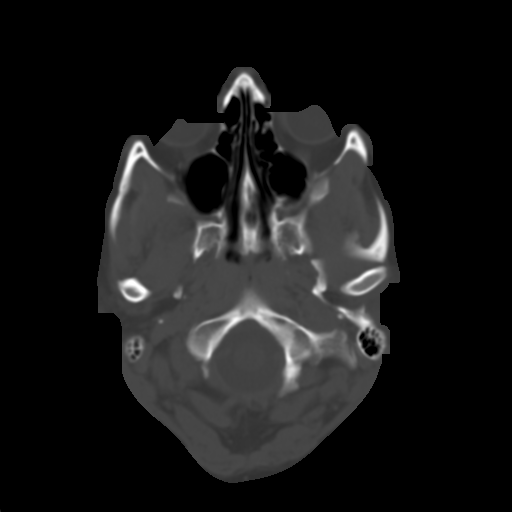
[im 4/32  brain]
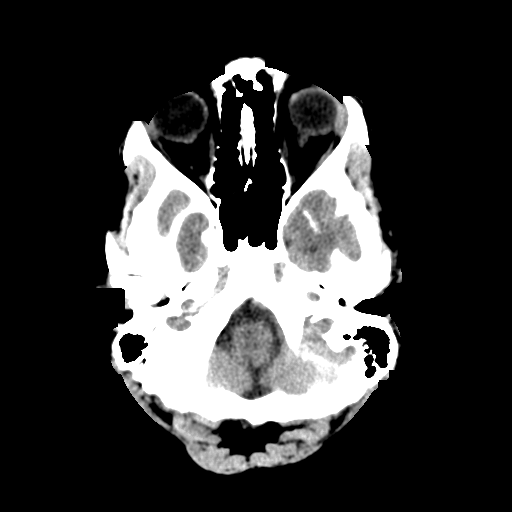
[im 6/32  brain]
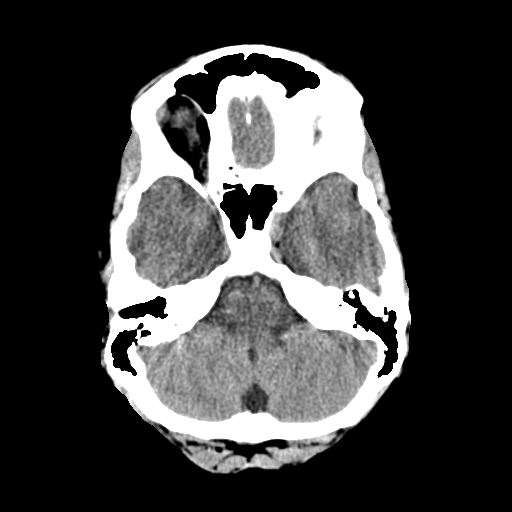
[im 8/32  brain]
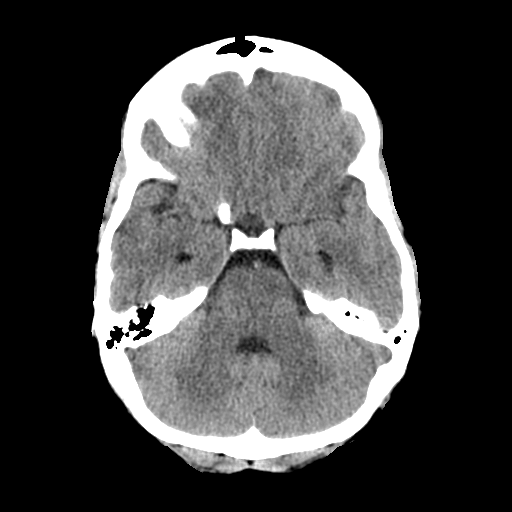
[im 9/32  brain]
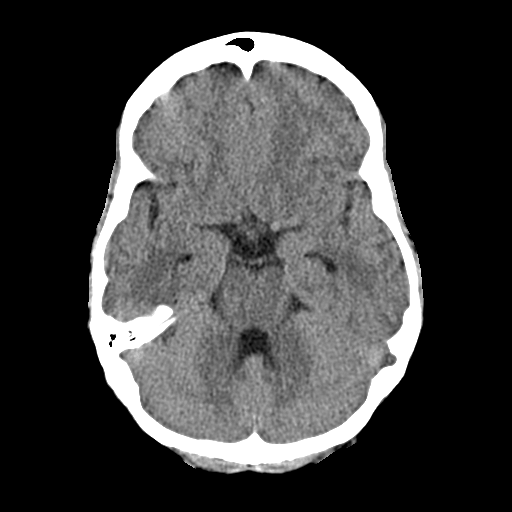
[im 9/32  bone]
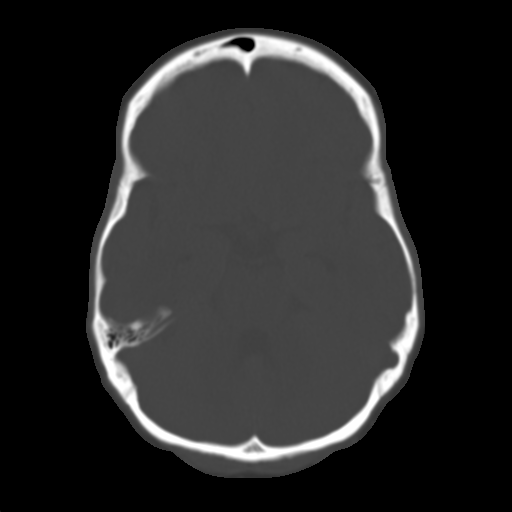
[im 11/32  brain]
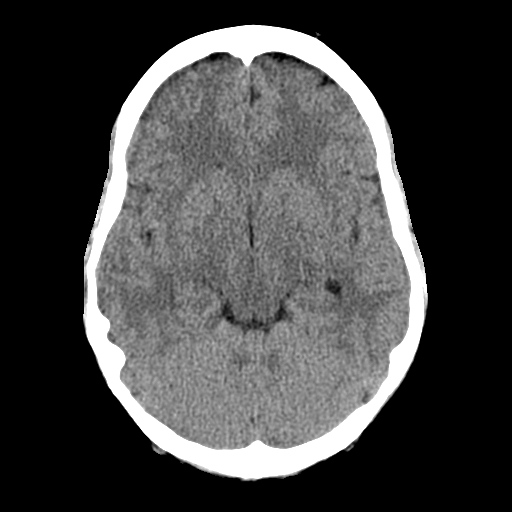
[im 13/32  brain]
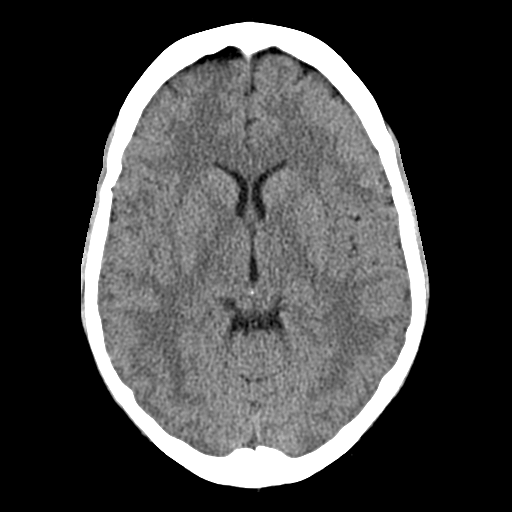
[im 15/32  brain]
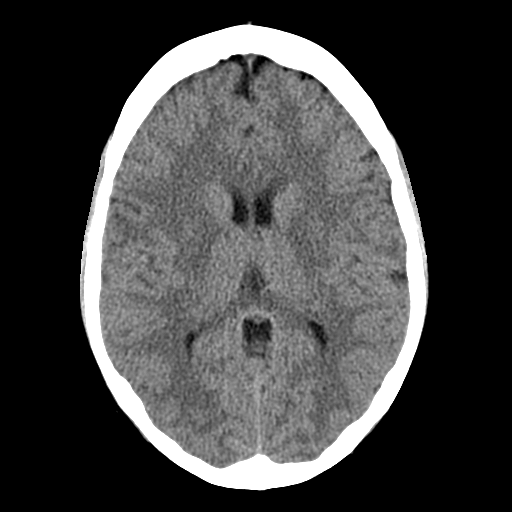
[im 17/32  brain]
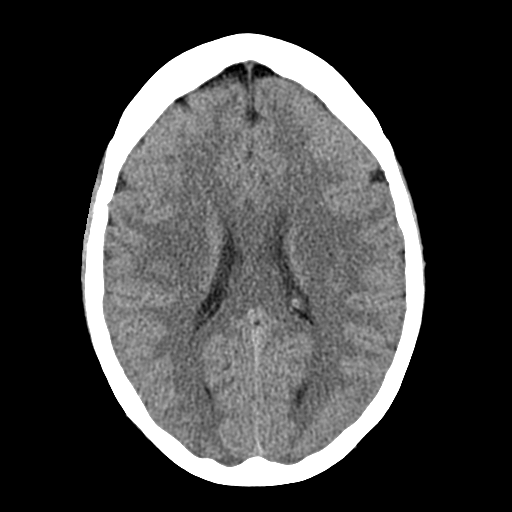
[im 17/32  bone]
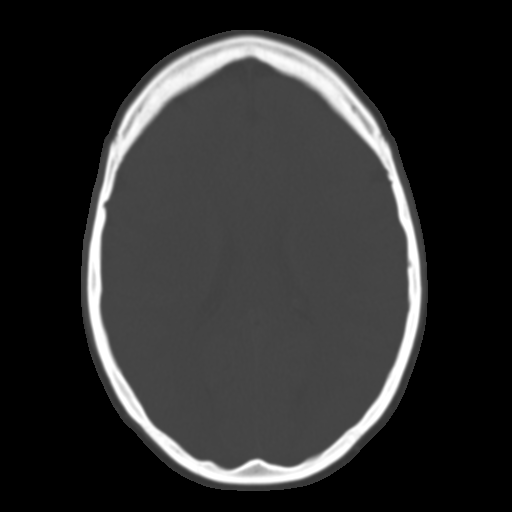
[im 19/32  brain]
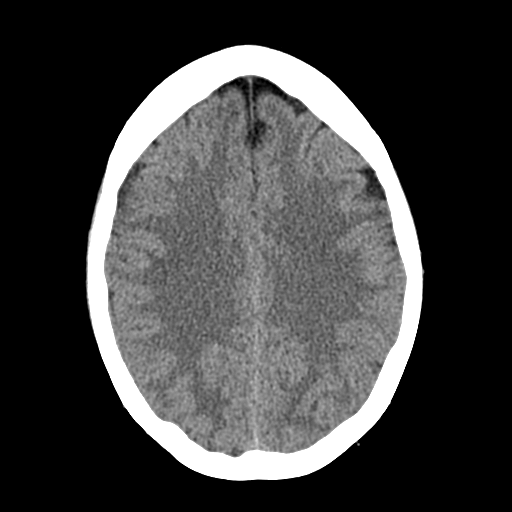
[im 21/32  brain]
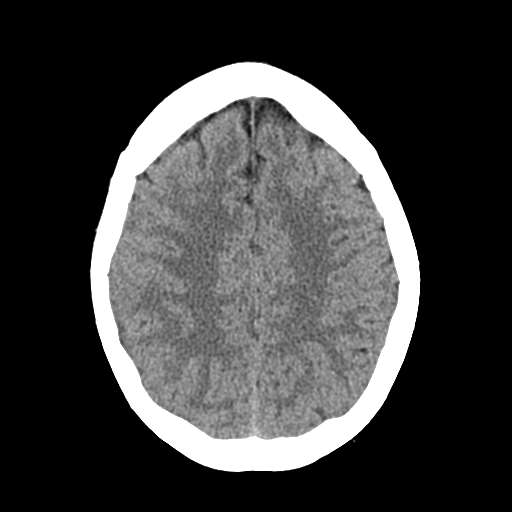
[im 23/32  brain]
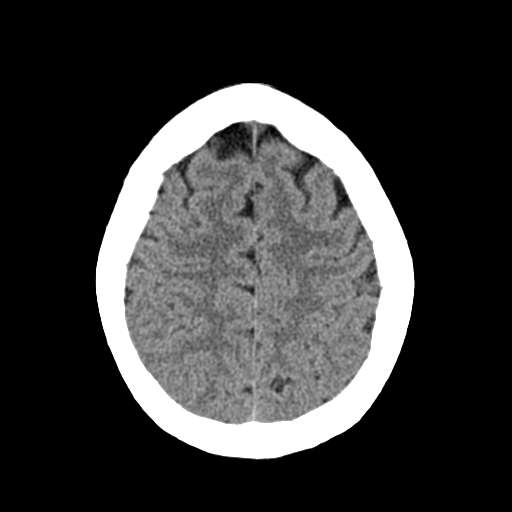
[im 24/32  brain]
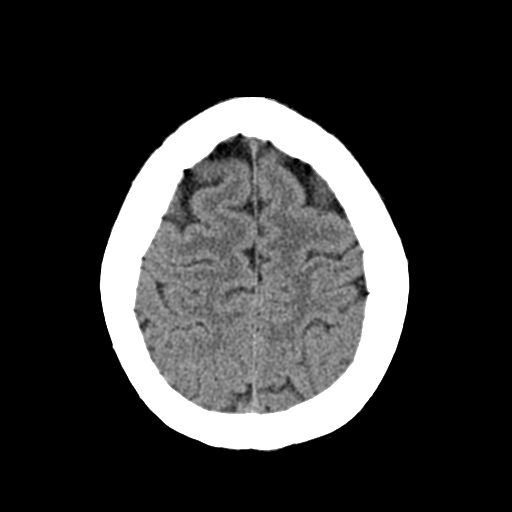
[im 24/32  bone]
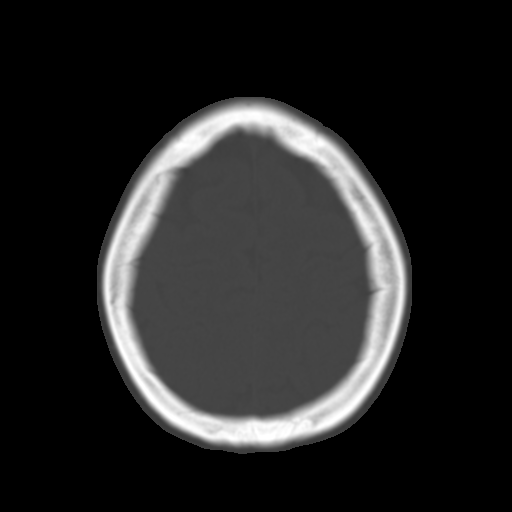
[im 26/32  brain]
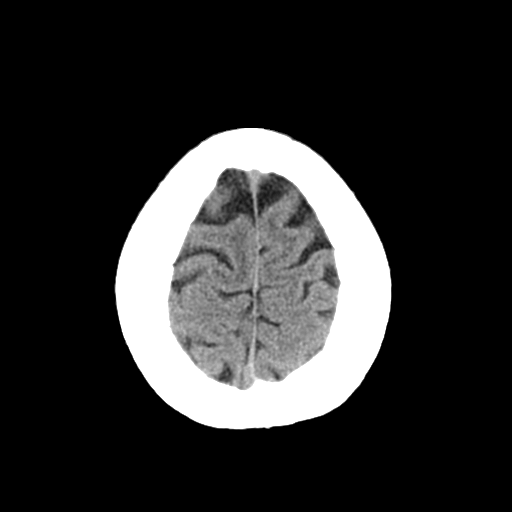
[im 28/32  brain]
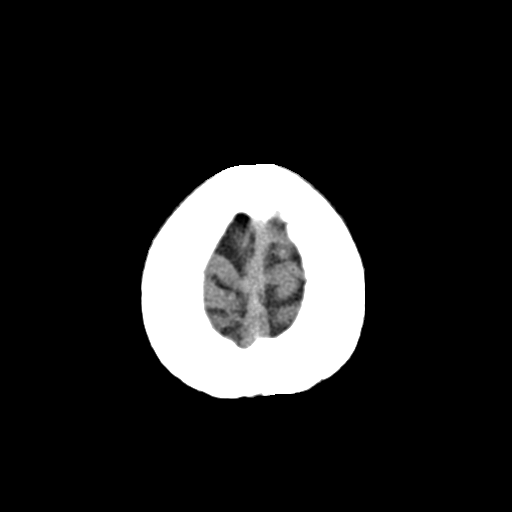
[im 30/32  brain]
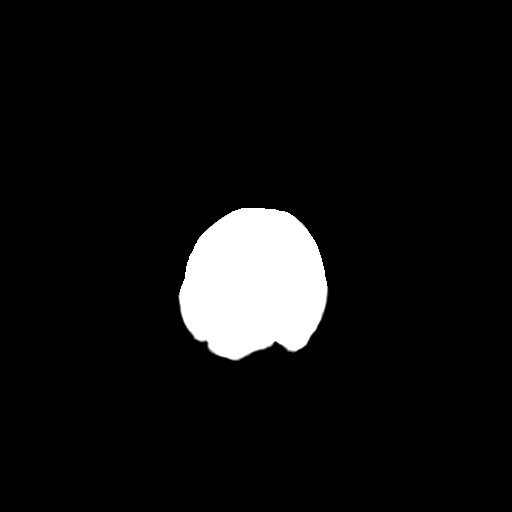

[16 of 30 positions shown; findings below may reference images not displayed]

FINDINGS: There is no evidence of intracranial hemorrhage, brain
edema, or other signs of acute infarction.  There is no evidence of
intracranial mass lesion or mass effect.  No abnormal extraaxial
fluid collections are identified.  There is no evidence of
hydrocephalus, or other significant intracranial abnormality.  No
skull abnormality identified.
IMPRESSION: Negative non-contrast head CT.

## 2011-01-21 ENCOUNTER — Encounter: Payer: Self-pay | Admitting: Obstetrics and Gynecology

## 2011-01-21 ENCOUNTER — Encounter: Payer: Self-pay | Admitting: Endocrinology

## 2011-02-04 LAB — TB SKIN TEST: TB Skin Test: NEGATIVE

## 2011-03-18 LAB — COMPREHENSIVE METABOLIC PANEL
ALT: 17 U/L (ref 0–35)
AST: 20 U/L (ref 0–37)
Albumin: 4.5 g/dL (ref 3.5–5.2)
Alkaline Phosphatase: 66 U/L (ref 39–117)
BUN: 13 mg/dL (ref 6–23)
CO2: 25 mEq/L (ref 19–32)
Calcium: 9.2 mg/dL (ref 8.4–10.5)
Chloride: 104 mEq/L (ref 96–112)
Creatinine, Ser: 0.7 mg/dL (ref 0.4–1.2)
GFR calc Af Amer: >60 mL/min (ref >60)
GFR calc non Af Amer: >60 mL/min (ref >60)
Glucose, Bld: 102 mg/dL — ABNORMAL HIGH (ref 70–99)
Potassium: 4.5 mEq/L (ref 3.5–5.1)
Sodium: 141 mEq/L (ref 135–145)
Total Bilirubin: 0.4 mg/dL (ref 0.3–1.2)
Total Protein: 7.9 g/dL (ref 6.0–8.3)

## 2011-03-18 LAB — BASIC METABOLIC PANEL
BUN: 13 mg/dL (ref 6–23)
CO2: 31 mEq/L (ref 19–32)
Calcium: 9.2 mg/dL (ref 8.4–10.5)
Chloride: 101 mEq/L (ref 96–112)
Creatinine, Ser: 0.7 mg/dL (ref 0.4–1.2)
GFR calc Af Amer: >60 mL/min (ref >60)
GFR calc non Af Amer: >60 mL/min (ref >60)
Glucose, Bld: 96 mg/dL (ref 70–99)
Potassium: 4.6 mEq/L (ref 3.5–5.1)
Sodium: 143 mEq/L (ref 135–145)

## 2011-03-18 LAB — URINALYSIS, ROUTINE W REFLEX MICROSCOPIC
Bilirubin Urine: NEGATIVE
Glucose, UA: NEGATIVE mg/dL
Hgb urine dipstick: NEGATIVE
Ketones, ur: NEGATIVE mg/dL
Nitrite: NEGATIVE
Protein, ur: NEGATIVE mg/dL
Specific Gravity, Urine: 1.019 (ref 1.005–1.030)
Urobilinogen, UA: 1 mg/dL (ref 0.0–1.0)
pH: 6.5 (ref 5.0–8.0)

## 2011-03-18 LAB — DIFFERENTIAL
Basophils Absolute: 0.1 10*3/uL (ref 0.0–0.1)
Basophils Absolute: 0.2 10*3/uL — ABNORMAL HIGH (ref 0.0–0.1)
Basophils Relative: 1 % (ref 0–1)
Basophils Relative: 2 % — ABNORMAL HIGH (ref 0–1)
Eosinophils Absolute: 0 10*3/uL (ref 0.0–0.7)
Eosinophils Absolute: 0.1 10*3/uL (ref 0.0–0.7)
Eosinophils Relative: 0 % (ref 0–5)
Eosinophils Relative: 1 % (ref 0–5)
Lymphocytes Relative: 15 % (ref 12–46)
Lymphocytes Relative: 20 % (ref 12–46)
Lymphs Abs: 1.3 10*3/uL (ref 0.7–4.0)
Lymphs Abs: 1.7 10*3/uL (ref 0.7–4.0)
Monocytes Absolute: 0.5 10*3/uL (ref 0.1–1.0)
Monocytes Absolute: 0.6 10*3/uL (ref 0.1–1.0)
Monocytes Relative: 5 % (ref 3–12)
Monocytes Relative: 7 % (ref 3–12)
Neutro Abs: 6.1 10*3/uL (ref 1.7–7.7)
Neutro Abs: 7 10*3/uL (ref 1.7–7.7)
Neutrophils Relative %: 70 % (ref 43–77)
Neutrophils Relative %: 78 % — ABNORMAL HIGH (ref 43–77)
Smear Review: NORMAL

## 2011-03-18 LAB — CSF CELL COUNT WITH DIFFERENTIAL
RBC Count, CSF: 0 /mm3
RBC Count, CSF: 11 /mm3 — ABNORMAL HIGH
Tube #: 1
Tube #: 4
WBC, CSF: 1 /mm3 (ref 0–5)
WBC, CSF: 2 /mm3 (ref 0–5)

## 2011-03-18 LAB — CBC
HCT: 39.9 % (ref 36.0–46.0)
HCT: 40.2 % (ref 36.0–46.0)
Hemoglobin: 13.4 g/dL (ref 12.0–15.0)
Hemoglobin: 13.5 g/dL (ref 12.0–15.0)
MCH: 30 pg (ref 26.0–34.0)
MCH: 30 pg (ref 26.0–34.0)
MCHC: 33.5 g/dL (ref 30.0–36.0)
MCHC: 33.8 g/dL (ref 30.0–36.0)
MCV: 88.9 fL (ref 78.0–100.0)
MCV: 89.6 fL (ref 78.0–100.0)
Platelets: 220 10*3/uL (ref 150–400)
Platelets: 241 10*3/uL (ref 150–400)
RBC: 4.48 MIL/uL (ref 3.87–5.11)
RBC: 4.49 MIL/uL (ref 3.87–5.11)
RDW: 12.5 % (ref 11.5–15.5)
RDW: 12.6 % (ref 11.5–15.5)
WBC: 8.7 10*3/uL (ref 4.0–10.5)
WBC: 8.9 10*3/uL (ref 4.0–10.5)

## 2011-03-18 LAB — URINE MICROSCOPIC-ADD ON

## 2011-03-18 LAB — GLUCOSE, CSF: Glucose, CSF: 64 mg/dL (ref 43–76)

## 2011-03-18 LAB — CSF CULTURE W GRAM STAIN
Culture: NO GROWTH
Gram Stain: NONE SEEN

## 2011-03-18 LAB — PREGNANCY, URINE: Preg Test, Ur: NEGATIVE

## 2011-03-18 LAB — LIPASE, BLOOD: Lipase: 54 U/L (ref 23–300)

## 2011-03-22 LAB — CBC
HCT: 39.1 % (ref 36.0–46.0)
MCHC: 34.7 g/dL (ref 30.0–36.0)
MCV: 90.8 fL (ref 78.0–100.0)
RBC: 4.31 MIL/uL (ref 3.87–5.11)
WBC: 7.5 10*3/uL (ref 4.0–10.5)

## 2011-03-22 LAB — COMPREHENSIVE METABOLIC PANEL
BUN: 10 mg/dL (ref 6–23)
CO2: 22 mEq/L (ref 19–32)
Calcium: 9 mg/dL (ref 8.4–10.5)
Chloride: 106 mEq/L (ref 96–112)
Creatinine, Ser: 0.67 mg/dL (ref 0.4–1.2)
GFR calc Af Amer: >60 mL/min (ref >60)
GFR calc non Af Amer: >60 mL/min (ref >60)
Glucose, Bld: 103 mg/dL — ABNORMAL HIGH (ref 70–99)
Total Bilirubin: 0.4 mg/dL (ref 0.3–1.2)

## 2011-03-22 LAB — LIPASE, BLOOD: Lipase: 23 U/L (ref 11–59)

## 2011-03-22 LAB — URINALYSIS, ROUTINE W REFLEX MICROSCOPIC
Bilirubin Urine: NEGATIVE
Leukocytes, UA: NEGATIVE
Nitrite: NEGATIVE
Specific Gravity, Urine: 1.014 (ref 1.005–1.030)
Urobilinogen, UA: 0.2 mg/dL (ref 0.0–1.0)
pH: 5.5 (ref 5.0–8.0)

## 2011-03-22 LAB — POCT PREGNANCY, URINE: Preg Test, Ur: NEGATIVE

## 2011-03-22 LAB — DIFFERENTIAL
Basophils Absolute: 0 10*3/uL (ref 0.0–0.1)
Eosinophils Relative: 0 % (ref 0–5)
Lymphocytes Relative: 14 % (ref 12–46)
Lymphs Abs: 1 10*3/uL (ref 0.7–4.0)
Neutro Abs: 5.8 10*3/uL (ref 1.7–7.7)
Neutrophils Relative %: 78 % — ABNORMAL HIGH (ref 43–77)

## 2011-03-22 LAB — URINE MICROSCOPIC-ADD ON

## 2011-04-04 LAB — CBC
HCT: 38.3 % (ref 36.0–46.0)
MCHC: 34.1 g/dL (ref 30.0–36.0)
MCV: 93 fL (ref 78.0–100.0)
MCV: 91.7 fL (ref 78.0–100.0)
Platelets: 155 10*3/uL (ref 150–400)
RBC: 3.84 MIL/uL — ABNORMAL LOW (ref 3.87–5.11)
RDW: 13.8 % (ref 11.5–15.5)
RDW: 14.1 % (ref 11.5–15.5)

## 2011-04-04 LAB — RPR: RPR Ser Ql: NONREACTIVE

## 2011-04-04 LAB — CCBB MATERNAL DONOR DRAW

## 2011-04-05 LAB — URINALYSIS, ROUTINE W REFLEX MICROSCOPIC
Bilirubin Urine: NEGATIVE
Hgb urine dipstick: NEGATIVE
Protein, ur: NEGATIVE mg/dL
Urobilinogen, UA: 0.2 mg/dL (ref 0.0–1.0)

## 2011-04-06 LAB — RH IMMUNE GLOBULIN WORKUP (NOT WOMEN'S HOSP)
ABO/RH(D): A NEG
Antibody Screen: NEGATIVE

## 2011-04-08 LAB — CBC
Hemoglobin: 10.7 g/dL — ABNORMAL LOW (ref 12.0–15.0)
MCHC: 34.7 g/dL (ref 30.0–36.0)
MCV: 92.8 fL (ref 78.0–100.0)
RBC: 3.31 MIL/uL — ABNORMAL LOW (ref 3.87–5.11)

## 2011-04-08 LAB — URINALYSIS, ROUTINE W REFLEX MICROSCOPIC
Bilirubin Urine: NEGATIVE
Hgb urine dipstick: NEGATIVE
Ketones, ur: NEGATIVE mg/dL
Protein, ur: NEGATIVE mg/dL
Urobilinogen, UA: 0.2 mg/dL (ref 0.0–1.0)

## 2011-04-08 LAB — URINE MICROSCOPIC-ADD ON

## 2011-05-18 NOTE — H&P (Signed)
NAMEVERLON, Monica Stephens               ACCOUNT NO.:  [id number]   MEDICAL RECORD NO.:  [id number]          PATIENT TYPE:  INP   LOCATION:  9175                          FACILITY:  [facility]   PHYSICIAN:  Crist Fat. Rivard, M.D. DATE OF BIRTH:  1981-09-08   DATE OF ADMISSION:  03/27/2006  DATE OF DISCHARGE:                                HISTORY & PHYSICAL   [name] is a 30 year old gravida 2, para 1-0-0-1 at 34 weeks who  presents for induction secondary to an AFI of 7.3 yesterday, a nonreactive  NST today, and a BPP of 6/8 giving an overall biophysical profile of 6/10.  There was no tone noted on BPP today.  Cervix was 1-2, 50%, vertex per Dr.  Stefano Gaul today.  Fluid was noted to be slightly increased today at 9.1.  Pregnancy has been remarkable for Rh negative with patient receiving RhoGAM  at 24 and 31 weeks, functional thyroid nodules and hyperthyroidism followed  by Dr. Carolynne Edouard and Dr. Lucianne Muss, positive group B Strep bacteruria which was  treated at approximately 18 weeks with oral antibiotics, history of IUGR and  oligo with last pregnancy.   PRENATAL LABORATORIES:  Blood type is A-.  Rh antibody negative.  VDRL  nonreactive.  Rubella titer positive.  Hepatitis B surface antigen negative.  Cystic fibrosis testing was negative.  Pap was normal.  GC and Chlamydia  cultures were negative.  Positive group B Strep bacteruria was noted at 17  weeks.  She had a quadruple screen that was normal.  Her Glucola was normal.  Hemoglobin upon entry into practice was 12 and then 11.1 at 26 weeks.  EDC  of April 17, 2006 was established by last menstrual period and was in  agreement with ultrasound at approximately 18 weeks.   HISTORY OF PRESENT PREGNANCY:  Patient entered care at [facility] at  17 weeks.  She had transferred from the [facility].  She had all  her prenatal laboratories and her Pap at [facility].  She did  have a urine culture done at that time at her visit at  [facility]  showing positive bacteruria.  This was treated with ampicillin and a plan  was made to treat in labor.  She had another ultrasound at 19 weeks showing  normal growth and development.  Quadruple screen was normal.  She had a  positive KOB at 24 weeks after a fall and was treated with one vial of  RhoGAM.  She began to have symptoms of weakness and tachycardia at  approximately 28 weeks.  She had persistent tachycardia.  She has had a  history of two thyroid nodules that were followed at [facility].  TSH that was done at 29 weeks showed hyperthyroidism.  Dr. Carolynne Edouard at  [facility] was recommending partial thyroidectomy.  Patient  preferred to delay this to postpartum.  She was followed during her  pregnancy by Dr. Carolynne Edouard and Dr. Lucianne Muss.  She began to have BPP and NSTs for  infant testing.  Her AFI was low, was showing oligo at 35 weeks.  She was  placed on increased rest.  Fluid was normal at 35-1/2 weeks.  She was seen  yesterday with an AFI of 7.3.  She had a nonreactive NST today and had no  tone noted on ultrasound.   OBSTETRICAL HISTORY:  In 2004 she had a vaginal birth of a female infant,  weight 7 pounds 1 ounce at 40-5/7 weeks.  She was in labor 12 hours.  She  had epidural anesthesia.  She was induced secondary to oligo, IUGR, and  abnormal Dopplers.  In 2005 she had a spontaneous miscarriage with a very,  very low quant.   MEDICAL HISTORY:  Patient is A-.  She has received RhoGAM in a previous  pregnancy.  She reports usual childhood illnesses.  She has been on oral  contraceptives in the past.  She had anemia in the past secondary to heavy  menses.  She had a back injury six years ago.  She has some superficial  varicosities.  She has no known medication allergies.   FAMILY HISTORY:  Maternal grandfather had heart disease.  Maternal  grandfather, maternal grandmother had chronic hypertension.  Maternal  grandfather had clots.  Maternal  grandmother had varicosities.  Paternal  uncle was an alcoholic and a crack addict.  Her father had hepatitis C.   GENETIC HISTORY:  Remarkable for a maternal cousin having Down syndrome and  maternal grandmother and father of the baby's paternal grandmother having a  stillbirth.   SOCIAL HISTORY:  Patient is married to the father of the baby.  He is  involved and supportive.  His name is Network engineer.  Patient is Caucasian,  of the Protestant faith.  She has three years of college.  She is a Consulting civil engineer.  Her husband has high school education.  He is employed with a [employer].  She has been followed initially by the certified midwife  service, but then was transferred to the physician service.  She denies any  alcohol, drug, or tobacco use during this pregnancy.   PHYSICAL EXAMINATION:  VITAL SIGNS:  Stable.  Patient is afebrile.  HEENT:  Within normal limits.  LUNGS:  Bilateral breath sounds are clear.  HEART:  Regular rate and rhythm without murmur.  BREASTS:  Soft and nontender.  ABDOMEN:  Fundal height is approximately 38 cm.  Estimated fetal weight is 6-  7 pounds.  Uterine contractions are very occasional and mild.  Heart rate is  reactive.  PELVIC:  Deferred at present, but was 1-2, 50%, vertex per Dr. Stefano Gaul  today in the office.  EXTREMITIES:  Deep tendon reflexes are 2+ without clonus.  There is a trace  edema noted.   IMPRESSION:  1.  Intrauterine pregnancy at 37 weeks.  2.  Hyperthyroidism.  3.  BPP 6/10 today.  4.  Positive group B Strep.   PLAN:  1.  Admit to birthing suite for consult with Dr. Estanislado Pandy as attending      physician.  2.  Routine physician orders.  3.  Dr. Estanislado Pandy will see patient and review plan of care.      Renaldo Reel Emilee Hero, C.N.M.      Crist Fat Rivard, M.D.  Electronically Signed    VLL/MEDQ  D:  03/27/2006  T:  03/27/2006  Job:  [id number]
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
## 2011-07-15 ENCOUNTER — Encounter: Payer: Self-pay | Admitting: *Deleted

## 2011-07-15 ENCOUNTER — Emergency Department (HOSPITAL_BASED_OUTPATIENT_CLINIC_OR_DEPARTMENT_OTHER)
Admission: EM | Admit: 2011-07-15 | Discharge: 2011-07-16 | Disposition: A | Discharge status: Home / Self Care | Payer: Managed Care, Other (non HMO) | Attending: Emergency Medicine | Admitting: Emergency Medicine

## 2011-07-15 DIAGNOSIS — C73 Malignant neoplasm of thyroid gland: Secondary | ICD-10-CM | POA: Insufficient documentation

## 2011-07-15 DIAGNOSIS — K5289 Other specified noninfective gastroenteritis and colitis: Secondary | ICD-10-CM | POA: Insufficient documentation

## 2011-07-15 DIAGNOSIS — R110 Nausea: Secondary | ICD-10-CM | POA: Insufficient documentation

## 2011-07-15 DIAGNOSIS — N390 Urinary tract infection, site not specified: Secondary | ICD-10-CM | POA: Insufficient documentation

## 2011-07-15 NOTE — ED Notes (Signed)
Pt states that she ate a chicken sandwich this Pm and realized it was raw in the middle pt began having diarrhea vomiting and nausea this PM pt ate around 6 pm and sx started at 2000

## 2011-07-16 LAB — URINE MICROSCOPIC-ADD ON

## 2011-07-16 LAB — COMPREHENSIVE METABOLIC PANEL
Albumin: 4.3 g/dL (ref 3.5–5.2)
Alkaline Phosphatase: 61 U/L (ref 39–117)
BUN: 13 mg/dL (ref 6–23)
CO2: 26 mEq/L (ref 19–32)
Chloride: 102 mEq/L (ref 96–112)
Creatinine, Ser: 0.7 mg/dL (ref 0.50–1.10)
GFR calc non Af Amer: >60 mL/min (ref >60)
Glucose, Bld: 91 mg/dL (ref 70–99)
Potassium: 3.7 mEq/L (ref 3.5–5.1)
Total Bilirubin: 0.3 mg/dL (ref 0.3–1.2)

## 2011-07-16 LAB — URINALYSIS, ROUTINE W REFLEX MICROSCOPIC
Glucose, UA: NEGATIVE mg/dL
Ketones, ur: 15 mg/dL — AB
Protein, ur: NEGATIVE mg/dL
Urobilinogen, UA: 0.2 mg/dL (ref 0.0–1.0)

## 2011-07-16 LAB — CBC
MCH: 30.6 pg (ref 26.0–34.0)
MCV: 89 fL (ref 78.0–100.0)
Platelets: 191 10*3/uL (ref 150–400)
RBC: 4.45 MIL/uL (ref 3.87–5.11)

## 2011-07-16 MED ORDER — SODIUM CHLORIDE 0.9 % IV BOLUS (SEPSIS): 250.0000 mL | Freq: Once | INTRAVENOUS | Status: DC

## 2011-07-16 MED ORDER — SODIUM CHLORIDE 0.9 % IV BOLUS (SEPSIS)
1000.0000 mL | Freq: Once | INTRAVENOUS | Status: AC
  Administered 2011-07-16: 1000 mL via INTRAVENOUS

## 2011-07-16 MED ORDER — ONDANSETRON HCL 4 MG/2ML IJ SOLN
4.0000 mg | Freq: Once | INTRAMUSCULAR | Status: AC
  Administered 2011-07-16: 4 mg via INTRAVENOUS
  Filled 2011-07-16: qty 2

## 2011-07-16 MED ORDER — LOPERAMIDE HCL 2 MG PO CAPS: 2.0000 mg | ORAL_CAPSULE | Freq: Four times a day (QID) | ORAL | Status: AC | PRN

## 2011-07-16 MED ORDER — ONDANSETRON HCL 4 MG PO TABS: 4.0000 mg | ORAL_TABLET | Freq: Four times a day (QID) | ORAL | Status: AC

## 2011-07-16 MED ORDER — SODIUM CHLORIDE 0.9 % IV SOLN
INTRAVENOUS | Status: DC
  Administered 2011-07-16: 1000 mL via INTRAVENOUS

## 2011-07-16 MED ORDER — CEPHALEXIN 500 MG PO CAPS: 500.0000 mg | ORAL_CAPSULE | Freq: Four times a day (QID) | ORAL | Status: AC

## 2011-07-16 MED ORDER — DEXTROSE 5 % IV SOLN
1.0000 g | Freq: Once | INTRAVENOUS | Status: AC
  Administered 2011-07-16: 1 g via INTRAVENOUS
  Filled 2011-07-16: qty 1
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
## 2011-07-17 LAB — URINE CULTURE
Colony Count: NO GROWTH
Culture  Setup Time: 201207161737

## 2011-07-24 LAB — COMPLETE METABOLIC PANEL WITH GFR
ALT: 12 U/L (ref 7–35)
Albumin: 4.7
Anion gap: 9.1
BUN, Bld: 11
CO2: 29 mmol/L
Calcium: 9.8 mg/dL
Chloride: 105 mmol/L
Creat: 0.83
Sodium: 138 mmol/L (ref 137–147)
Total Bilirubin: 0.5 mg/dL

## 2011-07-24 LAB — COMPREHENSIVE METABOLIC PANEL
ALT: 12 U/L (ref 7–35)
AST: 13 U/L
Albumin: 4.7
Anion gap: 9.1
BUN: 11 mg/dL (ref 4–21)
CO2: 29 mmol/L
Calcium: 9.8 mg/dL
Chloride: 105 mmol/L
GFR, Est Non African American: 81.86
Glucose: 90
Potassium: 4.7 mmol/L
TSH: 0.81
Total Protein: 7.3 g/dL

## 2011-07-24 LAB — CBC WITH DIFFERENTIAL/PLATELET
Basophils Absolute: 0 /uL
EOS%: 3 %
EOS: 0 %
Hematocrit: 41.2 % (ref 34.8–46)
MCV: 92.7 fL (ref 78–100)
MONO#: 0.7
NEUT%: 59 %
Platelets: 247 10*3/uL
RDW: 13.2
WBC: 7.4
lymph#: 2.1

## 2011-08-13 ENCOUNTER — Other Ambulatory Visit: Payer: Self-pay | Admitting: Oncology

## 2011-08-13 ENCOUNTER — Encounter (HOSPITAL_BASED_OUTPATIENT_CLINIC_OR_DEPARTMENT_OTHER): Payer: Managed Care, Other (non HMO) | Admitting: Oncology

## 2011-08-13 DIAGNOSIS — C73 Malignant neoplasm of thyroid gland: Secondary | ICD-10-CM

## 2011-08-13 DIAGNOSIS — E890 Postprocedural hypothyroidism: Secondary | ICD-10-CM

## 2011-08-13 DIAGNOSIS — D689 Coagulation defect, unspecified: Secondary | ICD-10-CM

## 2011-08-13 LAB — CBC WITH DIFFERENTIAL/PLATELET
Basophils Absolute: 0 10*3/uL (ref 0.0–0.1)
Eosinophils Absolute: 0 10*3/uL (ref 0.0–0.5)
HCT: 41.6 % (ref 34.8–46.6)
LYMPH%: 21 % (ref 14.0–49.7)
MCV: 91.9 fL (ref 79.5–101.0)
MONO#: 0.4 10*3/uL (ref 0.1–0.9)
MONO%: 5.3 % (ref 0.0–14.0)
NEUT#: 6.1 10*3/uL (ref 1.5–6.5)
NEUT%: 72.8 % (ref 38.4–76.8)
Platelets: 214 10*3/uL (ref 145–400)
WBC: 8.4 10*3/uL (ref 3.9–10.3)

## 2011-08-13 LAB — COMPREHENSIVE METABOLIC PANEL
BUN: 13 mg/dL (ref 6–23)
CO2: 30 mEq/L (ref 19–32)
Creatinine, Ser: 0.83 mg/dL (ref 0.50–1.10)
Glucose, Bld: 131 mg/dL — ABNORMAL HIGH (ref 70–99)
Total Bilirubin: 0.4 mg/dL (ref 0.3–1.2)
Total Protein: 7.9 g/dL (ref 6.0–8.3)

## 2011-08-14 LAB — APTT: aPTT: 46 seconds — ABNORMAL HIGH (ref 24–37)

## 2011-08-17 ENCOUNTER — Other Ambulatory Visit: Payer: Self-pay | Admitting: Oncology

## 2011-08-17 ENCOUNTER — Encounter (HOSPITAL_BASED_OUTPATIENT_CLINIC_OR_DEPARTMENT_OTHER): Payer: Managed Care, Other (non HMO) | Admitting: Oncology

## 2011-08-17 DIAGNOSIS — D689 Coagulation defect, unspecified: Secondary | ICD-10-CM

## 2011-08-17 DIAGNOSIS — C73 Malignant neoplasm of thyroid gland: Secondary | ICD-10-CM

## 2011-08-17 DIAGNOSIS — E890 Postprocedural hypothyroidism: Secondary | ICD-10-CM

## 2011-08-24 LAB — VON WILLEBRAND FACTOR MULTIMER: Factor-VIII Activity: 77 % (ref 50–180)

## 2011-09-17 ENCOUNTER — Inpatient Hospital Stay (HOSPITAL_COMMUNITY)
Admission: AD | Admit: 2011-09-17 | Discharge: 2011-09-17 | Discharge status: Against Medical Advice | Payer: Managed Care, Other (non HMO) | Source: Ambulatory Visit | Attending: Obstetrics and Gynecology | Admitting: Obstetrics and Gynecology

## 2011-09-17 DIAGNOSIS — O99891 Other specified diseases and conditions complicating pregnancy: Secondary | ICD-10-CM | POA: Insufficient documentation

## 2011-09-17 LAB — URINE MICROSCOPIC-ADD ON

## 2011-09-17 LAB — URINALYSIS, ROUTINE W REFLEX MICROSCOPIC
Glucose, UA: NEGATIVE mg/dL
Leukocytes, UA: NEGATIVE
Protein, ur: NEGATIVE mg/dL
Specific Gravity, Urine: 1.025 (ref 1.005–1.030)
pH: 6 (ref 5.0–8.0)

## 2011-09-17 LAB — POCT PREGNANCY, URINE: Preg Test, Ur: NEGATIVE

## 2011-09-17 NOTE — Progress Notes (Signed)
Pt is not wearing a pad at this time.

## 2011-09-17 NOTE — Plan of Care (Signed)
Pt is not in the lobby when called to a room in [facility]U.

## 2011-09-17 NOTE — Progress Notes (Signed)
Pt states she has done 2 HPT's that were POS. Started having bleeding last night and cramping started a couple of days and is getting worse.

## 2011-09-17 NOTE — Plan of Care (Signed)
Pt is not in the lobby when called to a room in [facility]U.  

## 2011-09-17 NOTE — ED Notes (Signed)
Patient not in lobby when called

## 2011-11-14 LAB — PT FACTOR INHIBITOR (MIXING STUDY)

## 2011-11-14 LAB — VON WILLEBRAND PANEL
Ristocetin Co-factor, Plasma: 54 % (ref 42–200)
Von Willebrand Antigen, Plasma: 63 % (ref 50–217)

## 2011-12-10 LAB — HEMOGLOBIN POC (KUC): Hemoglobin POC: 15 g/dL (ref 12–15)

## 2012-01-09 ENCOUNTER — Other Ambulatory Visit: Payer: Self-pay | Admitting: Family Medicine

## 2012-01-09 ENCOUNTER — Other Ambulatory Visit (HOSPITAL_COMMUNITY)
Admission: RE | Admit: 2012-01-09 | Discharge: 2012-01-09 | Disposition: A | Discharge status: Home / Self Care | Payer: Managed Care, Other (non HMO) | Source: Ambulatory Visit | Attending: Family Medicine | Admitting: Family Medicine

## 2012-01-09 DIAGNOSIS — Z0000 Encounter for general adult medical examination without abnormal findings: Secondary | ICD-10-CM | POA: Insufficient documentation

## 2012-01-09 DIAGNOSIS — Z113 Encounter for screening for infections with a predominantly sexual mode of transmission: Secondary | ICD-10-CM | POA: Insufficient documentation

## 2012-01-09 LAB — HIV ANTIBODY (ROUTINE TESTING W REFLEX)
HCV Ab: NEGATIVE
HIV 1/O/2 Abs-Index Value: <1
HSV 2 IGG,TYPE SPECIFIC AB: <0.91
HSV II IgG,Type Spec: NEGATIVE
Hep C Virus Ab: 0.1
RPR Ser-Titr: NEGATIVE

## 2012-01-09 LAB — URINALYSIS, DIPSTICK ONLY
Bilirubin (Urine): NEGATIVE
Glucose: NEGATIVE
Ketones: NEGATIVE
Nitrite Urine, Quantitative: NEGATIVE

## 2012-01-11 LAB — HIV ANTIBODY (ROUTINE TESTING W REFLEX): HIV 1/O/2 Abs-Index Value: <1

## 2012-01-11 LAB — RPR
Hep C Virus Ab: 0.1
RPR Ser-Titr: NONREACTIVE

## 2012-01-11 LAB — URINALYSIS, DIPSTICK ONLY
Glucose, Ur: NEGATIVE
Ketones, urine: NEGATIVE
Nitrite Urine, Quantitative: NEGATIVE
pH: 7

## 2012-06-24 LAB — BASIC METABOLIC PANEL
Anion gap: 10
BUN, Bld: 13
CPK 2 (MB): 116
Creat: 1.04
ESR: 13
GFR, Est African American: 75
Potassium: 4.3 mmol/L
Sodium: 137 mmol/L (ref 137–147)

## 2012-06-25 LAB — BASIC METABOLIC PANEL
Anion gap: 10
CO2: 28 mmol/L
Chloride: 103 mmol/L
ESR: 13
GFR, Est Non African American: 75
Potassium: 4.3 mmol/L
Sodium: 137 mmol/L (ref 137–147)

## 2012-07-07 ENCOUNTER — Telehealth: Payer: Self-pay | Admitting: Family Medicine

## 2012-07-07 NOTE — Telephone Encounter (Signed)
Please advise 

## 2012-07-07 NOTE — Telephone Encounter (Signed)
Notified patient's father

## 2012-07-07 NOTE — Telephone Encounter (Signed)
Sorry, but no.

## 2012-07-08 ENCOUNTER — Ambulatory Visit (INDEPENDENT_AMBULATORY_CARE_PROVIDER_SITE_OTHER): Payer: Managed Care, Other (non HMO) | Admitting: Family Medicine

## 2012-07-08 ENCOUNTER — Encounter: Payer: Self-pay | Admitting: Family Medicine

## 2012-07-08 VITALS — BP 101/66 | HR 78 | Temp 97.4°F | Ht 62.5 in | Wt 131.0 lb

## 2012-07-08 DIAGNOSIS — F339 Major depressive disorder, recurrent, unspecified: Secondary | ICD-10-CM

## 2012-07-08 DIAGNOSIS — N938 Other specified abnormal uterine and vaginal bleeding: Secondary | ICD-10-CM

## 2012-07-08 DIAGNOSIS — N949 Unspecified condition associated with female genital organs and menstrual cycle: Secondary | ICD-10-CM

## 2012-07-08 DIAGNOSIS — Z8742 Personal history of other diseases of the female genital tract: Secondary | ICD-10-CM

## 2012-07-08 DIAGNOSIS — C73 Malignant neoplasm of thyroid gland: Secondary | ICD-10-CM

## 2012-07-08 LAB — POCT URINE PREGNANCY: Preg Test, Ur: NEGATIVE

## 2012-07-08 MED ORDER — ALPRAZOLAM ER 3 MG PO TB24: 3.0000 mg | ORAL_TABLET | ORAL | Status: DC

## 2012-07-08 MED ORDER — FLUOXETINE HCL 20 MG PO TABS: ORAL_TABLET | ORAL | Status: DC

## 2012-07-08 MED ORDER — ALPRAZOLAM 1 MG PO TABS: ORAL_TABLET | ORAL | Status: DC
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
## 2012-07-09 ENCOUNTER — Encounter: Payer: Self-pay | Admitting: Family Medicine

## 2012-07-09 NOTE — Assessment & Plan Note (Signed)
Restart prozac at 20mg  qd dosing, may titrate up in 10d to 40mg . Restart Xanax XR 3mg  qAM and xanax 1mg  q8h prn. We may add on additional meds in near future (such as mood stabilizer/depression augmentation). She declines psychiatrist/counseling at this time due to financial constraints. Will try to get records from Dr. Mila Homer at [facility].

## 2012-07-09 NOTE — Assessment & Plan Note (Signed)
She has had success with depo-provera but is currently out of her "window" now to get this shot. UPT today was negative.  Will check another in 1 wk and she'll get 150mg  IM depo-provera q86mo if that one is neg as well (nurse visit).

## 2012-07-09 NOTE — Assessment & Plan Note (Signed)
Recent recurrence, starts radioactive iodine treatments again tomorrow.

## 2012-07-15 ENCOUNTER — Ambulatory Visit: Payer: Managed Care, Other (non HMO)

## 2012-07-21 ENCOUNTER — Ambulatory Visit (INDEPENDENT_AMBULATORY_CARE_PROVIDER_SITE_OTHER): Payer: Managed Care, Other (non HMO) | Admitting: Family Medicine

## 2012-07-21 ENCOUNTER — Encounter: Payer: Self-pay | Admitting: Family Medicine

## 2012-07-21 VITALS — BP 105/67 | HR 82 | Temp 97.8°F | Ht 62.5 in | Wt 126.0 lb

## 2012-07-21 DIAGNOSIS — Z7251 High risk heterosexual behavior: Secondary | ICD-10-CM

## 2012-07-21 DIAGNOSIS — F329 Major depressive disorder, single episode, unspecified: Secondary | ICD-10-CM

## 2012-07-21 DIAGNOSIS — F339 Major depressive disorder, recurrent, unspecified: Secondary | ICD-10-CM

## 2012-07-21 LAB — CBC WITH DIFFERENTIAL/PLATELET
Basophils Absolute: 0 10*3/uL (ref 0.0–0.1)
Basophils Relative: 0.6 % (ref 0.0–3.0)
Eosinophils Absolute: 0 10*3/uL (ref 0.0–0.7)
Lymphocytes Relative: 36.1 % (ref 12.0–46.0)
MCHC: 33.9 g/dL (ref 30.0–36.0)
Monocytes Absolute: 0.3 10*3/uL (ref 0.1–1.0)
Neutrophils Relative %: 53.3 % (ref 43.0–77.0)
Platelets: 161 10*3/uL (ref 150.0–400.0)
RBC: 4.04 Mil/uL (ref 3.87–5.11)

## 2012-07-21 LAB — COMPREHENSIVE METABOLIC PANEL
BUN: 12 mg/dL (ref 6–23)
CO2: 26 mEq/L (ref 19–32)
Calcium: 9.3 mg/dL (ref 8.4–10.5)
Chloride: 108 mEq/L (ref 96–112)
Creatinine, Ser: 0.9 mg/dL (ref 0.4–1.2)
GFR: 78.31 mL/min (ref >60.00)
Glucose, Bld: 109 mg/dL — ABNORMAL HIGH (ref 70–99)
Total Bilirubin: 0.7 mg/dL (ref 0.3–1.2)

## 2012-07-21 LAB — LIPID PANEL
Cholesterol: 165 mg/dL (ref 0–200)
HDL: 28.4 mg/dL — ABNORMAL LOW (ref >39.00)
Total CHOL/HDL Ratio: 6
Triglycerides: 70 mg/dL (ref 0.0–149.0)

## 2012-07-21 MED ORDER — QUETIAPINE FUMARATE ER 50 MG PO TB24: ORAL_TABLET | ORAL | Status: DC

## 2012-07-21 MED ORDER — ALPRAZOLAM 1 MG PO TABS: ORAL_TABLET | ORAL | Status: DC
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
## 2012-07-22 LAB — GC/CHLAMYDIA PROBE AMP, URINE
Chlamydia, Swab/Urine, PCR: NEGATIVE
GC Probe Amp, Urine: NEGATIVE

## 2012-07-23 NOTE — Assessment & Plan Note (Addendum)
Discussed need to increase her fluoxetine to 40mg  today and continue this dose until next f/u. I also considered adding lithium for augmentation of antidepressant treatment but her recurrence of thyroid cancer recently makes this med contraindicated. Considered depakote for mood stabilization but worry about wt gain with her on this med. I decided to start seroquel XR at 50mg  qhs dosing and titrate up to 150mg  qhs dosing by the time I see her again in 10d. Will check baseline lipids, CBC, CMET (she says she is fasting currently). Also, patient says her ex-husband had multiple affairs and she gets checked about q6 mo for STDs and she asks if we can do these today. I ordered urinary GC/Chlam probe and RPR and HIV blood tests.

## 2012-07-28 ENCOUNTER — Ambulatory Visit (INDEPENDENT_AMBULATORY_CARE_PROVIDER_SITE_OTHER): Payer: Managed Care, Other (non HMO) | Admitting: Family Medicine

## 2012-07-28 ENCOUNTER — Encounter: Payer: Self-pay | Admitting: Family Medicine

## 2012-07-28 VITALS — BP 108/77 | HR 98 | Ht 62.5 in | Wt 123.0 lb

## 2012-07-28 DIAGNOSIS — F339 Major depressive disorder, recurrent, unspecified: Secondary | ICD-10-CM

## 2012-07-28 DIAGNOSIS — Z7251 High risk heterosexual behavior: Secondary | ICD-10-CM

## 2012-07-28 LAB — HIV ANTIBODY (ROUTINE TESTING W REFLEX): HIV: NONREACTIVE

## 2012-07-28 NOTE — Assessment & Plan Note (Signed)
With severe anxiety/panic, as well. Discussed options today. May stay off fluoxetine that she self d/c'd 2 d/a. Stay off seroquel XR for now but possibly restart this in the future and hold her at the 50mg  dose for a long period to allow adjustment to side effect of sedation. Get back on xanax XR 3mg  qd. Start viibryd today.  Therapeutic expectations and side effect profile of medication discussed today.  Patient's questions answered. F/u 2 wks.
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
## 2012-07-29 LAB — RPR

## 2012-08-07 ENCOUNTER — Encounter: Payer: Self-pay | Admitting: Family Medicine

## 2012-08-11 ENCOUNTER — Telehealth: Payer: Self-pay | Admitting: Family Medicine

## 2012-08-11 DIAGNOSIS — F419 Anxiety disorder, unspecified: Secondary | ICD-10-CM

## 2012-08-11 MED ORDER — ALPRAZOLAM 1 MG PO TABS: ORAL_TABLET | ORAL | Status: DC

## 2012-08-11 MED ORDER — ALPRAZOLAM ER 3 MG PO TB24: 3.0000 mg | ORAL_TABLET | ORAL | Status: DC

## 2012-08-11 NOTE — Telephone Encounter (Signed)
Patient has an appt tomorrow but she took her last xanax yesterday. She is concerned about going 2 days w/o med. Can she get a Rx to [facility]?

## 2012-08-11 NOTE — Telephone Encounter (Signed)
Rx 's for her Xanax XR and xanax 1mg  "normal" release have been printed.

## 2012-08-12 ENCOUNTER — Ambulatory Visit: Payer: Managed Care, Other (non HMO) | Admitting: Family Medicine

## 2012-08-12 NOTE — Telephone Encounter (Signed)
Faxed on 08-11-2012.

## 2012-08-13 ENCOUNTER — Other Ambulatory Visit: Payer: Self-pay | Admitting: *Deleted

## 2012-08-13 ENCOUNTER — Encounter: Payer: Self-pay | Admitting: Family Medicine

## 2012-08-13 ENCOUNTER — Ambulatory Visit (INDEPENDENT_AMBULATORY_CARE_PROVIDER_SITE_OTHER): Payer: Managed Care, Other (non HMO) | Admitting: Family Medicine

## 2012-08-13 VITALS — BP 105/61 | HR 92 | Temp 98.1°F | Ht 62.0 in | Wt 121.0 lb

## 2012-08-13 DIAGNOSIS — F411 Generalized anxiety disorder: Secondary | ICD-10-CM

## 2012-08-13 DIAGNOSIS — F419 Anxiety disorder, unspecified: Secondary | ICD-10-CM

## 2012-08-13 DIAGNOSIS — F339 Major depressive disorder, recurrent, unspecified: Secondary | ICD-10-CM

## 2012-08-13 MED ORDER — ALPRAZOLAM 1 MG PO TABS: ORAL_TABLET | ORAL | Status: DC

## 2012-08-13 MED ORDER — ALPRAZOLAM ER 3 MG PO TB24: 3.0000 mg | ORAL_TABLET | ORAL | Status: DC

## 2012-08-13 NOTE — Assessment & Plan Note (Signed)
With significant anxiety component. She is minimally improved, if any, but is tolerating the viibryd well. Will continue with current meds: viibryd 40mg  qd, xanax XR 3mg  qAM, and xanax 1mg  q8h prn.  I had printed rx's of both xanax meds on 08/11/12 but pharmacy has no record of receiving these, so I printed them and faxed them to the pharmacy again today. We reviewed the option of counseling services to add to the medication therapy and she was agreeable to this.  I have submitted an order for referral to Hurley Cisco, LCSW at [facility] in [place]. F/u 1 mo, earlier if needed.
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
## 2012-08-20 ENCOUNTER — Encounter: Payer: Self-pay | Admitting: Family Medicine

## 2012-08-20 ENCOUNTER — Ambulatory Visit (INDEPENDENT_AMBULATORY_CARE_PROVIDER_SITE_OTHER): Payer: Managed Care, Other (non HMO) | Admitting: Family Medicine

## 2012-08-20 ENCOUNTER — Telehealth: Payer: Self-pay | Admitting: Family Medicine

## 2012-08-20 VITALS — BP 105/71 | HR 93 | Ht 62.0 in | Wt 119.0 lb

## 2012-08-20 DIAGNOSIS — C73 Malignant neoplasm of thyroid gland: Secondary | ICD-10-CM

## 2012-08-20 DIAGNOSIS — F339 Major depressive disorder, recurrent, unspecified: Secondary | ICD-10-CM

## 2012-08-20 MED ORDER — LITHIUM CARBONATE 300 MG PO CAPS: 300.0000 mg | ORAL_CAPSULE | Freq: Three times a day (TID) | ORAL | Status: DC

## 2012-08-20 NOTE — Assessment & Plan Note (Signed)
Not doing any better, but not worse. Discussed options.  I recommended she still contact [facility] for appt with their LCSW Hurley Cisco. I also discussed adding a med to augment her antidepressant.  Would like to add lithium but need to discuss this with her endocrinologist at [facility] (Dr. Jimmye Norman).  She declined augmentation with a stimulant med. We even discussed the possibility of electroshock therapy and she said she was open to "anything to make me better". Will research this option in our geographical area. I wrote a note stating she should not work for 2 wks (ending 09/03/12) and we'll possibly extend this depending on how she does. Discussed what to do if she gets SI or HI--call 911 or go to nearest ED. I'll see her again in 1wk, earlier if needed.
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
## 2012-08-20 NOTE — Telephone Encounter (Signed)
I forgot to mention that she needs to know that lithium is contraindicated in pregnancy, so if she is sexually active she should use condoms and should also reconsider getting back on depo-provera injections.  --thx

## 2012-08-20 NOTE — Telephone Encounter (Signed)
Pt mother notified of recommendations.  She will give [place] message.  She will have Yides call with any questions.

## 2012-08-20 NOTE — Telephone Encounter (Signed)
Pls notify pt that I talked with Dr. Jimmye Norman and she said it would be fine to start lithium. I sent eRx to pharmacy: Sig will say take 1 300mg  cap tid with meals.  I recommend she start this med at 300mg  twice daily for 3 days, then step up to the 300mg  three times a day dosing.  Call if any problems getting started on this med, need to check lithium level at her f/u in 1 wk so tell her not to take her morning dose of the med that day ( I want to get a trough level).  She should stay on viibryd like she has been taking it, also stay on prn xanax.   Also, tell her that no one in [place] does electroshock therapy, but [facility] thinks that [facility] does it.  I left a message with [facility] psych dept to get more info on their electroshock therapy.  I'll let her know when I have more info.--thx

## 2012-08-21 ENCOUNTER — Telehealth: Payer: Self-pay | Admitting: Family Medicine

## 2012-08-21 DIAGNOSIS — F329 Major depressive disorder, single episode, unspecified: Secondary | ICD-10-CM

## 2012-08-21 NOTE — Telephone Encounter (Signed)
Referral order placed for [facility] psychiatry dept.

## 2012-08-21 NOTE — Telephone Encounter (Signed)
Pls notify pt that I did get in touch with [facility] psych department and they do offer electroconvulsive therapy for severe depression.  If Monica Stephens wants [place] to proceed with a referral to them then we can do that now.  If she wants to wait a few weeks to see if the addition of lithium is helpful first then that is fine as well.  Let me know and I'll enter the appropriate order.--thx

## 2012-08-21 NOTE — Telephone Encounter (Signed)
Pt would like to go ahead a set process for an appt at [facility].  Advised it will take several days to hear back about appt and [facility] will call her if candidate. I discussed with patient to not take lithium the day of her next appt and to make sure she is preventing pregnancy if she is sexually active.  She would like to restart Depo.  Reviewed protocol with patient.  She is agreeable to UPT at next visit and 2nd UPT 1 week later to verify negative. Please enter order for referral.

## 2012-08-28 ENCOUNTER — Ambulatory Visit (INDEPENDENT_AMBULATORY_CARE_PROVIDER_SITE_OTHER): Payer: Managed Care, Other (non HMO) | Admitting: Family Medicine

## 2012-08-28 ENCOUNTER — Encounter: Payer: Self-pay | Admitting: Family Medicine

## 2012-08-28 ENCOUNTER — Ambulatory Visit: Payer: Managed Care, Other (non HMO) | Admitting: Family Medicine

## 2012-08-28 VITALS — BP 111/72 | HR 105 | Ht 62.0 in | Wt 119.0 lb

## 2012-08-28 DIAGNOSIS — F339 Major depressive disorder, recurrent, unspecified: Secondary | ICD-10-CM

## 2012-08-28 DIAGNOSIS — R0989 Other specified symptoms and signs involving the circulatory and respiratory systems: Secondary | ICD-10-CM

## 2012-08-28 DIAGNOSIS — R599 Enlarged lymph nodes, unspecified: Secondary | ICD-10-CM

## 2012-08-28 DIAGNOSIS — R591 Generalized enlarged lymph nodes: Secondary | ICD-10-CM

## 2012-08-28 DIAGNOSIS — Z309 Encounter for contraceptive management, unspecified: Secondary | ICD-10-CM

## 2012-08-28 LAB — POCT URINE PREGNANCY: Preg Test, Ur: NEGATIVE

## 2012-08-28 MED ORDER — ALPRAZOLAM ER 1 MG PO TB24: 1.0000 mg | ORAL_TABLET | ORAL | Status: AC
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
## 2012-08-31 NOTE — Assessment & Plan Note (Signed)
No significant change: still severely impaired by severe depression and chronic anxiety with panic.   Plan is to continue viibryd 40mg  qd, lithium 300mg  tid, and will decrease her xanax  XR to 1mg  qd (new rx given today) to hopefully alleviate some of her chronic underlying tension yet not oversedate her. Continue xanax IR 1mg  tid prn (no new rx for this today). Additionally, she is still interested in pursuing electroconvulsive therapy through [facility] psychiatry.  We have done the initial set-up for this and they were unable to contact her.  She reports she never got that phone message, so we gave her the contact number there today and she'll call them ASAP.  Meanwhile, I wrote a note excusing her from work through 09/08/12.  I will see her back in 1 wk, at which time we'll check a lithium trough.  She will also get her UPT repeated in prep for starting depo-provera injections.

## 2012-08-31 NOTE — Assessment & Plan Note (Signed)
Watchful waiting approach decided upon after discussion with patient today.

## 2012-09-05 ENCOUNTER — Encounter: Payer: Self-pay | Admitting: Family Medicine

## 2012-09-05 ENCOUNTER — Ambulatory Visit (INDEPENDENT_AMBULATORY_CARE_PROVIDER_SITE_OTHER): Payer: Managed Care, Other (non HMO) | Admitting: Family Medicine

## 2012-09-05 VITALS — BP 99/67 | HR 93 | Ht 62.0 in | Wt 118.0 lb

## 2012-09-05 DIAGNOSIS — Z309 Encounter for contraceptive management, unspecified: Secondary | ICD-10-CM

## 2012-09-05 DIAGNOSIS — F339 Major depressive disorder, recurrent, unspecified: Secondary | ICD-10-CM

## 2012-09-05 DIAGNOSIS — F419 Anxiety disorder, unspecified: Secondary | ICD-10-CM

## 2012-09-05 DIAGNOSIS — F411 Generalized anxiety disorder: Secondary | ICD-10-CM

## 2012-09-05 LAB — POCT URINE PREGNANCY: Preg Test, Ur: NEGATIVE

## 2012-09-05 MED ORDER — MEDROXYPROGESTERONE ACETATE 150 MG/ML IM SUSP
150.0000 mg | Freq: Once | INTRAMUSCULAR | Status: AC
  Administered 2012-09-05: 150 mg via INTRAMUSCULAR

## 2012-09-05 MED ORDER — LITHIUM CARBONATE 300 MG PO CAPS: 300.0000 mg | ORAL_CAPSULE | Freq: Three times a day (TID) | ORAL | Status: DC

## 2012-09-05 MED ORDER — ALPRAZOLAM 1 MG PO TABS: ORAL_TABLET | ORAL | Status: DC

## 2012-09-05 NOTE — Assessment & Plan Note (Signed)
Minimal improvement.  Tolerating lithium and viibryd combo. Lithium trough today may be a bit inaccurate given that her last dose was about 17 hours ago. She'll keep appt next week for [facility] psychiatry, consultation for ECT. She will continue her counseling with [facility] locally. Continue current med regimen. F/u in office in 10-14d. I wrote a letter today excusing her from work indefinitely.
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
## 2012-09-05 NOTE — Assessment & Plan Note (Signed)
She has had the necessary two UPTs one week apart--both neg. Depo provera 150mg  IM given today, repeat in 90d.

## 2012-09-06 LAB — LITHIUM LEVEL: Lithium Lvl: 0.2 meq/L — ABNORMAL LOW (ref 0.80–1.40)

## 2012-09-24 ENCOUNTER — Other Ambulatory Visit: Payer: Self-pay | Admitting: Family Medicine

## 2012-09-24 NOTE — Telephone Encounter (Signed)
Patient wanted to let Dr Milinda Cave know that she is starting the shock therapy treatments at [facility] on Friday 09/26/12. She would like to know if she needs an OV with Dr Milinda Cave before she goes for treatment. Also does she need an OV to get her refills?

## 2012-09-25 MED ORDER — VILAZODONE HCL 40 MG PO TABS: 40.0000 mg | ORAL_TABLET | Freq: Every day | ORAL | Status: DC

## 2012-09-25 NOTE — Telephone Encounter (Signed)
Notified pt of below.  Advised that pharmacy error was discovered by Jeannett Senior and he will fix for her.  Pt will pick up Viibryd and request available refill on XR.

## 2012-09-25 NOTE — Telephone Encounter (Signed)
Verbal per Dr. Milinda Cave, pt does not need OV. Pt has available refills at pharmacy on both alprazolam XR and plain.  Of note, per Jeannett Senior at [facility], pt was not given XR when prescription was filled/picked up on 09/13/12, pt was given plain.  Jeannett Senior will fix this at pharmacy.  They will fill next RX with XR.  Dr. Milinda Cave was made aware pt was given wrong dose. Refill sent for Viibryd, 30 x 2 per Dr. Milinda Cave.

## 2012-09-25 NOTE — Telephone Encounter (Signed)
I have attempted to contact this patient by phone with the following results: left message to return my call on answering machine (home).  Which dose needs refill?

## 2012-10-08 ENCOUNTER — Telehealth: Payer: Self-pay | Admitting: Family Medicine

## 2012-10-08 ENCOUNTER — Other Ambulatory Visit: Payer: Self-pay | Admitting: Family Medicine

## 2012-10-08 DIAGNOSIS — F419 Anxiety disorder, unspecified: Secondary | ICD-10-CM

## 2012-10-08 MED ORDER — ALPRAZOLAM 1 MG PO TABS: ORAL_TABLET | ORAL | Status: DC

## 2012-10-08 MED ORDER — ALPRAZOLAM ER 2 MG PO TB24: 2.0000 mg | ORAL_TABLET | ORAL | Status: DC

## 2012-10-08 NOTE — Telephone Encounter (Signed)
Notified of below.  Please refer to [facility].  She could not schedule while on the phone as she does not have a car and will have to coordinate a ride.  She will call back.

## 2012-10-08 NOTE — Telephone Encounter (Signed)
Will rx xanax XR and "plain" as I've previously prescribed for her. Nothing new for sleep--she is already on enough sedative med and adding more with the intent of making her sleep will simply be too dangerous.  Remind her that if she is suicidal she needs to go to the nearest ED or call 911. If she wants to be re-referred to [facility] then I'll do it, just let me know.  Have her make office f/u with me next week.  ---thx

## 2012-10-08 NOTE — Telephone Encounter (Signed)
Pt needs RF on Xanax.  Is taking 2 of the 1mg  XR.  1 tablet daily was not working.  She also needs refill on plain Xanax.  She has been out "for a while". Parents have given ultimatum to go live with a friend in [place] without her kids to seek psychiatric help there.  Pt was unable to leave kids and is now living in parent's basement away from her kids until she can get herself straightened out per her parents wishes.  Pt's last option was to return to ex husband which pt is unable to do. Pt is not seeing psych at [facility] as mother cancelled all her appts when pt was considering moving to [place].  Pt states she is unable to reschedule until she is re-referred from our office. Pt is also requesting somehting to help her sleep.  She states she cries until 430am and can not sleep. Please advise.

## 2012-10-21 ENCOUNTER — Ambulatory Visit: Payer: Managed Care, Other (non HMO) | Admitting: Family Medicine

## 2012-10-22 ENCOUNTER — Ambulatory Visit: Payer: Managed Care, Other (non HMO) | Admitting: Family Medicine

## 2012-10-23 ENCOUNTER — Ambulatory Visit: Payer: Managed Care, Other (non HMO) | Admitting: Family Medicine

## 2012-10-27 ENCOUNTER — Encounter: Payer: Self-pay | Admitting: Family Medicine

## 2012-10-27 ENCOUNTER — Ambulatory Visit (INDEPENDENT_AMBULATORY_CARE_PROVIDER_SITE_OTHER): Payer: Self-pay | Admitting: Family Medicine

## 2012-10-27 VITALS — BP 123/81 | HR 96 | Ht 62.0 in | Wt 105.0 lb

## 2012-10-27 DIAGNOSIS — F341 Dysthymic disorder: Secondary | ICD-10-CM

## 2012-10-27 DIAGNOSIS — F419 Anxiety disorder, unspecified: Secondary | ICD-10-CM

## 2012-10-27 DIAGNOSIS — F32A Depression, unspecified: Secondary | ICD-10-CM
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
## 2012-10-29 DIAGNOSIS — F339 Major depressive disorder, recurrent, unspecified: Secondary | ICD-10-CM | POA: Insufficient documentation

## 2012-10-29 NOTE — Assessment & Plan Note (Signed)
Depression improved with ECT. Memory impairment--possibly related to concurrent OVERUSE of xanax while getting ECT. Acute stress/anxiety reaction superimposed on chronic anxiety disorder---this is related to recent overuse of xanax and subsequent withdrawal when she ran out of meds. She was agreeable to returning to Dr. Carie Caddy office to see Arnetha Courser gave pt and father her phone # and address again today so she can initiate this process. I recommended she get back on viibryd, starter pack today.  She tolerated this med well, and hopefully it will lead to improvement in her anxiety over the next couple of weeks. No benzos anymore for this patient.  I was clear on that today and she expressed clear understanding, as did her father. F/u in 1 wk in office.

## 2012-11-20 ENCOUNTER — Telehealth: Payer: Self-pay

## 2012-11-20 NOTE — Telephone Encounter (Signed)
Patient called stating she dropped a form off on Monday for MD to fill out and was wandering if it is done?  Per Judeth Cornfield form is at front desk with Diane. Pt informed

## 2013-04-30 ENCOUNTER — Other Ambulatory Visit: Payer: Self-pay | Admitting: Family Medicine

## 2013-04-30 DIAGNOSIS — N949 Unspecified condition associated with female genital organs and menstrual cycle: Secondary | ICD-10-CM

## 2013-04-30 DIAGNOSIS — N938 Other specified abnormal uterine and vaginal bleeding: Secondary | ICD-10-CM

## 2013-07-23 LAB — CBC WITH DIFFERENTIAL/PLATELET
Basophils Absolute: 0 /uL
Eosinophils Absolute: 0 /uL
HCT, POC: 41.2 % (ref 34.0–46.6)
LYMPH%: 28 %
MCHC: 34.2
MCV: 92.7 fL (ref 78–100)
MONO#: 0.7
NEUT#: 4.3
NEUT%: 59 %
Platelet count: 247 10*3/uL (ref 140–400)
RDW: 13.2
WBC: 7.4

## 2013-07-23 LAB — TSH: TSH: 0.81

## 2013-08-13 LAB — URINALYSIS, MANUAL ONLY
Bilirubin (Urine): NEGATIVE
Glucose: NEGATIVE
SGPG IgM: 1.025
pH: 7

## 2013-08-13 LAB — URINALYSIS, DIPSTICK ONLY
Bilirubin (Urine): NEGATIVE
Glucose, Ur: NEGATIVE
Ketones, urine: NEGATIVE
Nitrites, Initial: NEGATIVE
Protein, Ur: 30
Urobilinogen, UA: 0.2
pH: 7

## 2013-09-15 ENCOUNTER — Ambulatory Visit (INDEPENDENT_AMBULATORY_CARE_PROVIDER_SITE_OTHER): Payer: BC Managed Care – PPO | Admitting: Nurse Practitioner

## 2013-09-15 ENCOUNTER — Encounter: Payer: Self-pay | Admitting: Nurse Practitioner

## 2013-09-15 DIAGNOSIS — Z309 Encounter for contraceptive management, unspecified: Secondary | ICD-10-CM

## 2013-09-15 DIAGNOSIS — Z0000 Encounter for general adult medical examination without abnormal findings: Secondary | ICD-10-CM

## 2013-09-15 DIAGNOSIS — Z30017 Encounter for initial prescription of implantable subdermal contraceptive: Secondary | ICD-10-CM

## 2013-09-15 DIAGNOSIS — Z23 Encounter for immunization: Secondary | ICD-10-CM

## 2013-09-15 DIAGNOSIS — D72819 Decreased white blood cell count, unspecified: Secondary | ICD-10-CM

## 2013-09-15 DIAGNOSIS — Z293 Encounter for prophylactic fluoride administration: Secondary | ICD-10-CM

## 2013-09-15 LAB — CBC WITH DIFFERENTIAL/PLATELET
Basophils Relative: 0.6 % (ref 0.0–3.0)
Eosinophils Relative: 2.3 % (ref 0.0–5.0)
MCV: 93.2 fl (ref 78.0–100.0)
Monocytes Absolute: 0.3 10*3/uL (ref 0.1–1.0)
Monocytes Relative: 6.2 % (ref 3.0–12.0)
Neutrophils Relative %: 67.7 % (ref 43.0–77.0)
RBC: 4.24 Mil/uL (ref 3.87–5.11)
WBC: 5.5 10*3/uL (ref 4.5–10.5)

## 2013-09-15 LAB — URINALYSIS, ROUTINE W REFLEX MICROSCOPIC
Hgb urine dipstick: NEGATIVE
Ketones, ur: NEGATIVE
RBC / HPF: NONE SEEN (ref 0–?)
Specific Gravity, Urine: 1.02 (ref 1.000–1.030)
Urine Glucose: NEGATIVE
Urobilinogen, UA: 1 (ref 0.0–1.0)

## 2013-09-15 LAB — HEPATIC FUNCTION PANEL
Bilirubin, Direct: 0.1 mg/dL (ref 0.0–0.3)
Total Bilirubin: 0.6 mg/dL (ref 0.3–1.2)
Total Protein: 7 g/dL (ref 6.0–8.3)

## 2013-09-15 LAB — RENAL FUNCTION PANEL
Albumin: 4.3 g/dL (ref 3.5–5.2)
Chloride: 106 mEq/L (ref 96–112)
GFR: 73.9 mL/min (ref >60.00)
Phosphorus: 2.9 mg/dL (ref 2.3–4.6)
Potassium: 3.8 mEq/L (ref 3.5–5.1)
Sodium: 140 mEq/L (ref 135–145)

## 2013-09-15 MED ORDER — MEDROXYPROGESTERONE ACETATE 150 MG/ML IM SUSP
150.0000 mg | Freq: Once | INTRAMUSCULAR | Status: AC
  Administered 2013-09-15: 150 mg via INTRAMUSCULAR

## 2013-09-15 NOTE — Progress Notes (Signed)
Subjective:    Monica Stephens is a 32 y.o. female who presents for contraception counseling. The patient has no complaints today. The patient is sexually active. Pertinent past medical history: current smoker, thyroid cancer. Family history includes mother with estrogen receptive breast cancer and thyroid cancer. Medical history includes thyroid cancer for which she is followed by endo, and depression, for which she is followed by psychiatry. She has been using depo-provera for 2 years.     The following portions of the patient's history were reviewed and updated as appropriate: allergies, current medications, past family history, past medical history, past social history, past surgical history and problem list.  Review of Systems Constitutional: negative for anorexia, fatigue, fevers, night sweats and weight loss Genitourinary:positive for abnormal menstrual periods, negative for genital lesions and vaginal discharge  Behavioral/Psych: seeing psychiatrist and therapist at [facility], symptoms controlled Objective:    Constitutional: no distress, groomed, distracted with texting at times Head: atraumatic, normocephalic Eyes: lids & lashes clear, sclera clear pupils round Respiratory: normal effort Musc: normal gait pt will return for pap and CPE.Marland Kitchen   Assessment:    32 y.o., continuing Depo-Provera injections, no contraindications.   Plan:    All questions answered. Discussed healthy lifestyle modifications. Discussed bone thinning with depo provera. Discussed other option of mirena. Estrgen BC not recommended as pt smokes and has fam Hx of estrogen receptive breast ca.

## 2013-09-15 NOTE — Patient Instructions (Signed)
I will call you with lab results. Consider having mirena placed rather than continuing depo provera injections. You will need to schedule appt. With gynecology if you want to switch. Take a walk every day, at least 30 minutes. The benefits are numerous: lower rates of dementia, heart disease, diabetes, better bone health, less depression, better sleep. No medicine can do all that! Think about cutting back on cigarettes to lower your cancer and stroke risk. Pleasure to meet you today!  Preventive Care for Adults, Female A healthy lifestyle and preventive care can promote health and wellness. Preventive health guidelines for women include the following key practices.  A routine yearly physical is a good way to check with your caregiver about your health and preventive screening. It is a chance to share any concerns and updates on your health, and to receive a thorough exam.  Visit your dentist for a routine exam and preventive care every 6 months. Brush your teeth twice a day and floss once a day. Good oral hygiene prevents tooth decay and gum disease.  The frequency of eye exams is based on your age, health, family medical history, use of contact lenses, and other factors. Follow your caregiver's recommendations for frequency of eye exams.  Eat a healthy diet. Foods like vegetables, fruits, whole grains, low-fat dairy products, and lean protein foods contain the nutrients you need without too many calories. Decrease your intake of foods high in solid fats, added sugars, and salt. Eat the right amount of calories for you.Get information about a proper diet from your caregiver, if necessary.  Regular physical exercise is one of the most important things you can do for your health. Most adults should get at least 150 minutes of moderate-intensity exercise (any activity that increases your heart rate and causes you to sweat) each week. In addition, most adults need muscle-strengthening exercises on 2 or  more days a week.  Maintain a healthy weight. The body mass index (BMI) is a screening tool to identify possible weight problems. It provides an estimate of body fat based on height and weight. Your caregiver can help determine your BMI, and can help you achieve or maintain a healthy weight.For adults 20 years and older:  A BMI below 18.5 is considered underweight.  A BMI of 18.5 to 24.9 is normal.  A BMI of 25 to 29.9 is considered overweight.  A BMI of 30 and above is considered obese.  Maintain normal blood lipids and cholesterol levels by exercising and minimizing your intake of saturated fat. Eat a balanced diet with plenty of fruit and vegetables. Blood tests for lipids and cholesterol should begin at age 18 and be repeated every 5 years. If your lipid or cholesterol levels are high, you are over 50, or you are at high risk for heart disease, you may need your cholesterol levels checked more frequently.Ongoing high lipid and cholesterol levels should be treated with medicines if diet and exercise are not effective.  If you smoke, find out from your caregiver how to quit. If you do not use tobacco, do not start.  If you are pregnant, do not drink alcohol. If you are breastfeeding, be very cautious about drinking alcohol. If you are not pregnant and choose to drink alcohol, do not exceed 1 drink per day. One drink is considered to be 12 ounces (355 mL) of beer, 5 ounces (148 mL) of wine, or 1.5 ounces (44 mL) of liquor.  Avoid use of street drugs. Do not share needles  with anyone. Ask for help if you need support or instructions about stopping the use of drugs.  High blood pressure causes heart disease and increases the risk of stroke. Your blood pressure should be checked at least every 1 to 2 years. Ongoing high blood pressure should be treated with medicines if weight loss and exercise are not effective.  If you are 10 to 31 years old, ask your caregiver if you should take aspirin to  prevent strokes.  Diabetes screening involves taking a blood sample to check your fasting blood sugar level. This should be done once every 3 years, after age 45, if you are within normal weight and without risk factors for diabetes. Testing should be considered at a younger age or be carried out more frequently if you are overweight and have at least 1 risk factor for diabetes.  Breast cancer screening is essential preventive care for women. You should practice "breast self-awareness." This means understanding the normal appearance and feel of your breasts and may include breast self-examination. Any changes detected, no matter how small, should be reported to a caregiver. Women in their 75s and 30s should have a clinical breast exam (CBE) by a caregiver as part of a regular health exam every 1 to 3 years. After age 70, women should have a CBE every year. Starting at age 29, women should consider having a mammography (breast X-ray test) every year. Women who have a family history of breast cancer should talk to their caregiver about genetic screening. Women at a high risk of breast cancer should talk to their caregivers about having magnetic resonance imaging (MRI) and a mammography every year.  The Pap test is a screening test for cervical cancer. A Pap test can show cell changes on the cervix that might become cervical cancer if left untreated. A Pap test is a procedure in which cells are obtained and examined from the lower end of the uterus (cervix).  Women should have a Pap test starting at age 67.  Between ages 50 and 59, Pap tests should be repeated every 2 years.  Beginning at age 76, you should have a Pap test every 3 years as long as the past 3 Pap tests have been normal.  Some women have medical problems that increase the chance of getting cervical cancer. Talk to your caregiver about these problems. It is especially important to talk to your caregiver if a new problem develops soon after  your last Pap test. In these cases, your caregiver may recommend more frequent screening and Pap tests.  The above recommendations are the same for women who have or have not gotten the vaccine for human papillomavirus (HPV).  If you had a hysterectomy for a problem that was not cancer or a condition that could lead to cancer, then you no longer need Pap tests. Even if you no longer need a Pap test, a regular exam is a good idea to make sure no other problems are starting.  If you are between ages 86 and 71, and you have had normal Pap tests going back 10 years, you no longer need Pap tests. Even if you no longer need a Pap test, a regular exam is a good idea to make sure no other problems are starting.  If you have had past treatment for cervical cancer or a condition that could lead to cancer, you need Pap tests and screening for cancer for at least 20 years after your treatment.  If Pap tests  have been discontinued, risk factors (such as a new sexual partner) need to be reassessed to determine if screening should be resumed.  The HPV test is an additional test that may be used for cervical cancer screening. The HPV test looks for the virus that can cause the cell changes on the cervix. The cells collected during the Pap test can be tested for HPV. The HPV test could be used to screen women aged 16 years and older, and should be used in women of any age who have unclear Pap test results. After the age of 55, women should have HPV testing at the same frequency as a Pap test.  Colorectal cancer can be detected and often prevented. Most routine colorectal cancer screening begins at the age of 55 and continues through age 86. However, your caregiver may recommend screening at an earlier age if you have risk factors for colon cancer. On a yearly basis, your caregiver may provide home test kits to check for hidden blood in the stool. Use of a small camera at the end of a tube, to directly examine the colon  (sigmoidoscopy or colonoscopy), can detect the earliest forms of colorectal cancer. Talk to your caregiver about this at age 44, when routine screening begins. Direct examination of the colon should be repeated every 5 to 10 years through age 84, unless early forms of pre-cancerous polyps or small growths are found.  Hepatitis C blood testing is recommended for all people born from [birth year] through 1965 and any individual with known risks for hepatitis C.  Practice safe sex. Use condoms and avoid high-risk sexual practices to reduce the spread of sexually transmitted infections (STIs). STIs include gonorrhea, chlamydia, syphilis, trichomonas, herpes, HPV, and human immunodeficiency virus (HIV). Herpes, HIV, and HPV are viral illnesses that have no cure. They can result in disability, cancer, and death. Sexually active women aged 52 and younger should be checked for chlamydia. Older women with new or multiple partners should also be tested for chlamydia. Testing for other STIs is recommended if you are sexually active and at increased risk.  Osteoporosis is a disease in which the bones lose minerals and strength with aging. This can result in serious bone fractures. The risk of osteoporosis can be identified using a bone density scan. Women ages 56 and over and women at risk for fractures or osteoporosis should discuss screening with their caregivers. Ask your caregiver whether you should take a calcium supplement or vitamin D to reduce the rate of osteoporosis.  Menopause can be associated with physical symptoms and risks. Hormone replacement therapy is available to decrease symptoms and risks. You should talk to your caregiver about whether hormone replacement therapy is right for you.  Use sunscreen with sun protection factor (SPF) of 30 or more. Apply sunscreen liberally and repeatedly throughout the day. You should seek shade when your shadow is shorter than you. Protect yourself by wearing long sleeves,  pants, a wide-brimmed hat, and sunglasses year round, whenever you are outdoors.  Once a month, do a whole body skin exam, using a mirror to look at the skin on your back. Notify your caregiver of new moles, moles that have irregular borders, moles that are larger than a pencil eraser, or moles that have changed in shape or color.  Stay current with required immunizations.  Influenza. You need a dose every fall (or winter). The composition of the flu vaccine changes each year, so being vaccinated once is not enough.  Pneumococcal polysaccharide.  You need 1 to 2 doses if you smoke cigarettes or if you have certain chronic medical conditions. You need 1 dose at age 10 (or older) if you have never been vaccinated.  Tetanus, diphtheria, pertussis (Tdap, Td). Get 1 dose of Tdap vaccine if you are younger than age 57, are over 68 and have contact with an infant, are a Research scientist (physical sciences), are pregnant, or simply want to be protected from whooping cough. After that, you need a Td booster dose every 10 years. Consult your caregiver if you have not had at least 3 tetanus and diphtheria-containing shots sometime in your life or have a deep or dirty wound.  HPV. You need this vaccine if you are a woman age 50 or younger. The vaccine is given in 3 doses over 6 months.  Measles, mumps, rubella (MMR). You need at least 1 dose of MMR if you were born in 1957 or later. You may also need a second dose.  Meningococcal. If you are age 75 to 36 and a first-year college student living in a residence hall, or have one of several medical conditions, you need to get vaccinated against meningococcal disease. You may also need additional booster doses.  Zoster (shingles). If you are age 21 or older, you should get this vaccine.  Varicella (chickenpox). If you have never had chickenpox or you were vaccinated but received only 1 dose, talk to your caregiver to find out if you need this vaccine.  Hepatitis A. You need this  vaccine if you have a specific risk factor for hepatitis A virus infection or you simply wish to be protected from this disease. The vaccine is usually given as 2 doses, 6 to 18 months apart.  Hepatitis B. You need this vaccine if you have a specific risk factor for hepatitis B virus infection or you simply wish to be protected from this disease. The vaccine is given in 3 doses, usually over 6 months. Preventive Services / Frequency Ages 39 to 45  Blood pressure check.** / Every 1 to 2 years.  Lipid and cholesterol check.** / Every 5 years beginning at age 44.  Clinical breast exam.** / Every 3 years for women in their 7s and 30s.  Pap test.** / Every 2 years from ages 7 through 74. Every 3 years starting at age 27 through age 35 or 77 with a history of 3 consecutive normal Pap tests.  HPV screening.** / Every 3 years from ages 104 through ages 21 to 34 with a history of 3 consecutive normal Pap tests.  Hepatitis C blood test.** / For any individual with known risks for hepatitis C.  Skin self-exam. / Monthly.  Influenza immunization.** / Every year.  Pneumococcal polysaccharide immunization.** / 1 to 2 doses if you smoke cigarettes or if you have certain chronic medical conditions.  Tetanus, diphtheria, pertussis (Tdap, Td) immunization. / A one-time dose of Tdap vaccine. After that, you need a Td booster dose every 10 years.  HPV immunization. / 3 doses over 6 months, if you are 22 and younger.  Measles, mumps, rubella (MMR) immunization. / You need at least 1 dose of MMR if you were born in 1957 or later. You may also need a second dose.  Meningococcal immunization. / 1 dose if you are age 60 to 53 and a first-year college student living in a residence hall, or have one of several medical conditions, you need to get vaccinated against meningococcal disease. You may also need additional booster doses.  Varicella immunization.** / Consult your caregiver.  Hepatitis A  immunization.** / Consult your caregiver. 2 doses, 6 to 18 months apart.  Hepatitis B immunization.** / Consult your caregiver. 3 doses usually over 6 months. Ages 14 to 2  Blood pressure check.** / Every 1 to 2 years.  Lipid and cholesterol check.** / Every 5 years beginning at age 69.  Clinical breast exam.** / Every year after age 55.  Mammogram.** / Every year beginning at age 60 and continuing for as long as you are in good health. Consult with your caregiver.  Pap test.** / Every 3 years starting at age 61 through age 5 or 33 with a history of 3 consecutive normal Pap tests.  HPV screening.** / Every 3 years from ages 62 through ages 98 to 71 with a history of 3 consecutive normal Pap tests.  Fecal occult blood test (FOBT) of stool. / Every year beginning at age 83 and continuing until age 70. You may not need to do this test if you get a colonoscopy every 10 years.  Flexible sigmoidoscopy or colonoscopy.** / Every 5 years for a flexible sigmoidoscopy or every 10 years for a colonoscopy beginning at age 5 and continuing until age 72.  Hepatitis C blood test.** / For all people born from [birth year] through 1965 and any individual with known risks for hepatitis C.  Skin self-exam. / Monthly.  Influenza immunization.** / Every year.  Pneumococcal polysaccharide immunization.** / 1 to 2 doses if you smoke cigarettes or if you have certain chronic medical conditions.  Tetanus, diphtheria, pertussis (Tdap, Td) immunization.** / A one-time dose of Tdap vaccine. After that, you need a Td booster dose every 10 years.  Measles, mumps, rubella (MMR) immunization. / You need at least 1 dose of MMR if you were born in 1957 or later. You may also need a second dose.  Varicella immunization.** / Consult your caregiver.  Meningococcal immunization.** / Consult your caregiver.  Hepatitis A immunization.** / Consult your caregiver. 2 doses, 6 to 18 months apart.  Hepatitis B immunization.** /  Consult your caregiver. 3 doses, usually over 6 months. Ages 12 and over  Blood pressure check.** / Every 1 to 2 years.  Lipid and cholesterol check.** / Every 5 years beginning at age 69.  Clinical breast exam.** / Every year after age 32.  Mammogram.** / Every year beginning at age 64 and continuing for as long as you are in good health. Consult with your caregiver.  Pap test.** / Every 3 years starting at age 53 through age 41 or 69 with a 3 consecutive normal Pap tests. Testing can be stopped between 65 and 70 with 3 consecutive normal Pap tests and no abnormal Pap or HPV tests in the past 10 years.  HPV screening.** / Every 3 years from ages 58 through ages 63 or 57 with a history of 3 consecutive normal Pap tests. Testing can be stopped between 65 and 70 with 3 consecutive normal Pap tests and no abnormal Pap or HPV tests in the past 10 years.  Fecal occult blood test (FOBT) of stool. / Every year beginning at age 25 and continuing until age 71. You may not need to do this test if you get a colonoscopy every 10 years.  Flexible sigmoidoscopy or colonoscopy.** / Every 5 years for a flexible sigmoidoscopy or every 10 years for a colonoscopy beginning at age 59 and continuing until age 64.  Hepatitis C blood test.** / For all people born  from 1944 through 1965 and any individual with known risks for hepatitis C.  Osteoporosis screening.** / A one-time screening for women ages 46 and over and women at risk for fractures or osteoporosis.  Skin self-exam. / Monthly.  Influenza immunization.** / Every year.  Pneumococcal polysaccharide immunization.** / 1 dose at age 71 (or older) if you have never been vaccinated.  Tetanus, diphtheria, pertussis (Tdap, Td) immunization. / A one-time dose of Tdap vaccine if you are over 65 and have contact with an infant, are a Research scientist (physical sciences), or simply want to be protected from whooping cough. After that, you need a Td booster dose every 10  years.  Varicella immunization.** / Consult your caregiver.  Meningococcal immunization.** / Consult your caregiver.  Hepatitis A immunization.** / Consult your caregiver. 2 doses, 6 to 18 months apart.  Hepatitis B immunization.** / Check with your caregiver. 3 doses, usually over 6 months. ** Family history and personal history of risk and conditions may change your caregiver's recommendations. Document Released: 02/12/2002 Document Revised: 03/10/2012 Document Reviewed: 05/14/2011 [facility] Patient Information 2014 [place], [place].

## 2013-09-30 ENCOUNTER — Other Ambulatory Visit (HOSPITAL_COMMUNITY)
Admission: RE | Admit: 2013-09-30 | Discharge: 2013-09-30 | Disposition: A | Discharge status: Home / Self Care | Payer: BC Managed Care – PPO | Source: Ambulatory Visit | Attending: Nurse Practitioner | Admitting: Nurse Practitioner

## 2013-09-30 ENCOUNTER — Encounter: Payer: BC Managed Care – PPO | Admitting: Nurse Practitioner

## 2013-09-30 ENCOUNTER — Encounter: Payer: Self-pay | Admitting: Nurse Practitioner

## 2013-09-30 ENCOUNTER — Telehealth (INDEPENDENT_AMBULATORY_CARE_PROVIDER_SITE_OTHER): Payer: BC Managed Care – PPO | Admitting: Nurse Practitioner

## 2013-09-30 VITALS — BP 112/60 | HR 85 | Temp 98.1°F | Ht 62.0 in | Wt 132.5 lb

## 2013-09-30 DIAGNOSIS — Z01419 Encounter for gynecological examination (general) (routine) without abnormal findings: Secondary | ICD-10-CM | POA: Insufficient documentation

## 2013-09-30 DIAGNOSIS — M6281 Muscle weakness (generalized): Secondary | ICD-10-CM

## 2013-09-30 DIAGNOSIS — Z124 Encounter for screening for malignant neoplasm of cervix: Secondary | ICD-10-CM

## 2013-09-30 DIAGNOSIS — Z113 Encounter for screening for infections with a predominantly sexual mode of transmission: Secondary | ICD-10-CM | POA: Insufficient documentation

## 2013-09-30 DIAGNOSIS — R87810 Cervical high risk human papillomavirus (HPV) DNA test positive: Secondary | ICD-10-CM | POA: Insufficient documentation

## 2013-09-30 DIAGNOSIS — R21 Rash and other nonspecific skin eruption: Secondary | ICD-10-CM

## 2013-09-30 DIAGNOSIS — N760 Acute vaginitis: Secondary | ICD-10-CM | POA: Insufficient documentation

## 2013-09-30 DIAGNOSIS — Z0000 Encounter for general adult medical examination without abnormal findings: Secondary | ICD-10-CM

## 2013-09-30 DIAGNOSIS — Z1151 Encounter for screening for human papillomavirus (HPV): Secondary | ICD-10-CM | POA: Insufficient documentation

## 2013-09-30 DIAGNOSIS — L719 Rosacea, unspecified: Secondary | ICD-10-CM

## 2013-09-30 MED ORDER — METRONIDAZOLE 1 % EX GEL: Freq: Every day | CUTANEOUS | Status: DC

## 2013-09-30 NOTE — Patient Instructions (Addendum)
I am not sure if I am seeing genital warts or other skin disorder. I will refer you to gynecology for further work up. If you have genital warts, you may have had the virus for several years. Gynecology will likely biopsy the lesions. Our office will call you with lab results. I think your cough is related to a virus. You will likely be better early next week. Great to see you!

## 2013-09-30 NOTE — Progress Notes (Signed)
Subjective:     Monica Stephens is a 32 y.o. female and is here for a comprehensive physical exam. The patient reports cough & runny nose for 2 days.   History   Social History  . Marital Status: Divorced    Spouse Name: N/A    Number of Children: 3  . Years of Education: N/A   Occupational History  . qualified professional     [employer]   Social History Main Topics  . Smoking status: Current Every Day Smoker -- 1.00 packs/day for 2 years    Types: Cigarettes  . Smokeless tobacco: Never Used  . Alcohol Use: 1.2 oz/week    2 Glasses of wine per week     Comment: drinks 1 bottle of wine every other weekend when she doesn't have her kids.  . Drug Use: No  . Sexual Activity: Not on file   Other Topics Concern  . Not on file   Social History Narrative   Divorced. Has 3 children. Ex-husband has custody every other weekend. Lives with parents in [place], and sister & neice (as of 06/2012).   Works at [employer]   .  +Smoker.   Health Maintenance  Topic Date Due  . Influenza Vaccine  07/31/2014  . Pap Smear  01/08/2015  . Tetanus/tdap  01/08/2022    The following portions of the patient's history were reviewed and updated as appropriate: allergies, current medications, past family history, past medical history, past social history, past surgical history and problem list.  Review of Systems Constitutional: negative for anorexia, chills, fatigue, fevers, night sweats and weight loss Ears, nose, mouth, throat, and face: negative for hoarseness and sore throat Respiratory: positive for cough and runny nose, negative for asthma, dyspnea on exertion, pleurisy/chest pain, pneumonia, sputum and wheezing Cardiovascular: negative for chest pain, chest pressure/discomfort, fatigue, irregular heart beat, lower extremity edema and near-syncope Gastrointestinal: negative for abdominal pain, change in bowel habits, constipation, diarrhea and reflux symptoms Genitourinary:negative  for genital lesions, sexual problems, vaginal discharge and had heavy bleeding w/depo-provera in past, frequency, hematuria and hesitancy Integument/breast: positive for rash Hematologic/lymphatic: LAD vs thyroid nodule L anterior neck, followed by endo in w-s Behavioral/Psych: positive for current treatment for depression, negative for excessive alcohol consumption Endocrine: negative for diabetic symptoms including polydipsia, polyphagia and polyuria and temperature intolerance   Objective:    BP 112/60  Pulse 85  Temp(Src) 98.1 F (36.7 C) (Oral)  Ht 5\' 2"  (1.575 m)  Wt 132 lb 8 oz (60.102 kg)  BMI 24.23 kg/m2  SpO2 96% General appearance: alert, cooperative, appears stated age and no distress Head: Normocephalic, without obvious abnormality, atraumatic Eyes: negative findings: lids and lashes normal, conjunctivae and sclerae normal, corneas clear and pupils equal, round, reactive to light and accomodation Ears: normal TM's and external ear canals both ears Throat: lips, mucosa, and tongue normal; teeth and gums normal Neck: no adenopathy, no carotid bruit, supple, symmetrical, trachea midline and thyroid not enlarged, symmetric, no tenderness/mass/nodules Lungs: clear to auscultation bilaterally Breasts: normal appearance, no masses or tenderness Heart: regular rate and rhythm, S1, S2 normal, no murmur, click, rub or gallop Abdomen: soft, non-tender; bowel sounds normal; no masses,  no organomegaly Pelvic: adnexae not palpable, cervix normal in appearance, external genitalia normal, no adnexal masses or tenderness, no bladder tenderness, no cervical motion tenderness, positive findings: cystocele, uterus normal size, shape, and consistency and several flesh colored, skin lesions between anus & vagina: look like warts Extremities: extremities normal,  atraumatic, no cyanosis or edema Pulses: 2+ and symmetric Skin: facial erythema, rosacea? Lymph nodes: no axillary or supraclavicular  LAD, Pt has one palpable nodule L anterior neck-she says it is LN, endo is watching it-had [place] last mo. Neurologic: Motor: Left seems weaker than righ Upper & lower extremities.    Assessment:   Perineal lesions-look like warts. Pap & ancillary orders.  Facial erythema, looks like rosacea. L sided weakness, upper & lower extremities.  Plan:    1 Ref to gynecology. Monitor for results. 2 metrogel 3 Consider ref to neurology See After Visit Summary for Counseling Recommendations

## 2013-10-01 ENCOUNTER — Telehealth: Payer: Self-pay | Admitting: Nurse Practitioner

## 2013-10-01 NOTE — Progress Notes (Signed)
Subjective:     Monica Stephens is a 32 y.o. female and is here for a comprehensive physical exam. The patient reports cough & runny nose for 2 days.   History   Social History  . Marital Status: Divorced    Spouse Name: N/A    Number of Children: 3  . Years of Education: N/A   Occupational History  . qualified professional     [employer]   Social History Main Topics  . Smoking status: Current Every Day Smoker -- 1.00 packs/day for 2 years    Types: Cigarettes  . Smokeless tobacco: Never Used  . Alcohol Use: 1.2 oz/week    2 Glasses of wine per week     Comment: drinks 1 bottle of wine every other weekend when she doesn't have her kids.  . Drug Use: No  . Sexual Activity: Not on file   Other Topics Concern  . Not on file   Social History Narrative   Divorced. Has 3 children. Ex-husband has custody every other weekend. Lives with parents in [place], and sister & neice (as of 06/2012).   Works at [employer]   .  +Smoker.   Health Maintenance  Topic Date Due  . Influenza Vaccine  07/31/2014  . Pap Smear  01/08/2015  . Tetanus/tdap  01/08/2022    The following portions of the patient's history were reviewed and updated as appropriate: allergies, current medications, past family history, past medical history, past social history, past surgical history and problem list.  Review of Systems Constitutional: negative for anorexia, chills, fatigue, fevers, night sweats and weight loss Ears, nose, mouth, throat, and face: negative for hoarseness and sore throat Respiratory: positive for cough and runny nose, negative for asthma, dyspnea on exertion, pleurisy/chest pain, pneumonia, sputum and wheezing Cardiovascular: negative for chest pain, chest pressure/discomfort, fatigue, irregular heart beat, lower extremity edema and near-syncope Gastrointestinal: negative for abdominal pain, change in bowel habits, constipation, diarrhea and reflux symptoms Genitourinary:negative  for genital lesions, sexual problems, vaginal discharge and had heavy bleeding w/depo-provera in past, frequency, hematuria and hesitancy Integument/breast: positive for rash Hematologic/lymphatic: LAD vs thyroid nodule L anterior neck, followed by endo in w-s Behavioral/Psych: positive for current treatment for depression, negative for excessive alcohol consumption Endocrine: negative for diabetic symptoms including polydipsia, polyphagia and polyuria and temperature intolerance   Objective:    BP 112/60  Pulse 85  Temp(Src) 98.1 F (36.7 C) (Oral)  Ht 5\' 2"  (1.575 m)  Wt 132 lb 8 oz (60.102 kg)  BMI 24.23 kg/m2  SpO2 96% General appearance: alert, cooperative, appears stated age and no distress Head: Normocephalic, without obvious abnormality, atraumatic Eyes: negative findings: lids and lashes normal, conjunctivae and sclerae normal, corneas clear and pupils equal, round, reactive to light and accomodation Ears: normal TM's and external ear canals both ears Throat: lips, mucosa, and tongue normal; teeth and gums normal Neck: no adenopathy, no carotid bruit, supple, symmetrical, trachea midline and thyroid not enlarged, symmetric, no tenderness/mass/nodules Lungs: clear to auscultation bilaterally Breasts: normal appearance, no masses or tenderness Heart: regular rate and rhythm, S1, S2 normal, no murmur, click, rub or gallop Abdomen: soft, non-tender; bowel sounds normal; no masses,  no organomegaly Pelvic: adnexae not palpable, cervix normal in appearance, external genitalia normal, no adnexal masses or tenderness, no bladder tenderness, no cervical motion tenderness, positive findings: cystocele, uterus normal size, shape, and consistency and several flesh colored, skin lesions between anus & vagina: look like warts Extremities: extremities normal,  atraumatic, no cyanosis or edema Pulses: 2+ and symmetric Skin: facial erythema, rosacea? Lymph nodes: no axillary or supraclavicular  LAD, Pt has one palpable nodule L anterior neck-she says it is LN, endo is watching it-had [place] last mo. Neurologic: Motor: Left seems weaker than righ Upper & lower extremities.    Assessment:   Perineal lesions-look like warts. Pap & ancillary orders.  Facial erythema, looks like rosacea. L sided weakness, upper & lower extremities.  Plan:     1 Ref to gynecology. Monitor for results. 2 metrogel as prescribed 3 Ref to neurology

## 2013-10-01 NOTE — Telephone Encounter (Signed)
Will treat facial erythema w/metrogel. Will refer to neuro for eval of L sided weakness. Have not discussed w/pt. LM for her to CB.

## 2013-10-05 ENCOUNTER — Telehealth: Payer: Self-pay | Admitting: Nurse Practitioner

## 2013-10-05 NOTE — Telephone Encounter (Signed)
See phone note, pap results & ov faxed to gynecology, per front desk (Diane Tomerlaine).

## 2013-10-09 ENCOUNTER — Encounter: Payer: Self-pay | Admitting: Neurology

## 2013-10-09 ENCOUNTER — Ambulatory Visit (INDEPENDENT_AMBULATORY_CARE_PROVIDER_SITE_OTHER): Payer: BC Managed Care – PPO | Admitting: Neurology

## 2013-10-09 VITALS — BP 100/58 | HR 90 | Temp 98.1°F | Resp 14 | Ht 62.0 in | Wt 130.5 lb

## 2013-10-09 DIAGNOSIS — F329 Major depressive disorder, single episode, unspecified: Secondary | ICD-10-CM
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
## 2013-10-15 ENCOUNTER — Encounter: Payer: Self-pay | Admitting: *Deleted

## 2013-10-20 ENCOUNTER — Other Ambulatory Visit: Payer: Self-pay | Admitting: Obstetrics and Gynecology

## 2013-10-26 ENCOUNTER — Ambulatory Visit (INDEPENDENT_AMBULATORY_CARE_PROVIDER_SITE_OTHER): Payer: BC Managed Care – PPO | Admitting: Nurse Practitioner

## 2013-10-26 ENCOUNTER — Encounter: Payer: Self-pay | Admitting: Nurse Practitioner

## 2013-10-26 VITALS — BP 98/60 | HR 89 | Temp 98.0°F | Ht 62.0 in | Wt 135.5 lb

## 2013-10-26 DIAGNOSIS — R197 Diarrhea, unspecified: Secondary | ICD-10-CM

## 2013-10-26 DIAGNOSIS — R112 Nausea with vomiting, unspecified: Secondary | ICD-10-CM

## 2013-10-26 MED ORDER — ONDANSETRON 8 MG PO TBDP: 8.0000 mg | ORAL_TABLET | Freq: Two times a day (BID) | ORAL | Status: DC | PRN

## 2013-10-26 NOTE — Patient Instructions (Signed)
I hope this is a medication side-effect that will resolve. Perhaps you can take the lamictal at night to manage the nausea, if it is contributing. If the diarrhea & nausea are persistent after 10 days, please call for re-eval. Please provide stool sample so I can rule out parasite.

## 2013-10-26 NOTE — Progress Notes (Signed)
  Subjective:    Patient ID: Monica Stephens, female    DOB: October 21, 1981, 32 y.o.   MRN: [id number]  Diarrhea  This is a new problem. The current episode started in the past 7 days (diarrhea & nausea started 1 week ago after colposcopy. Started lamictal 3 weeks ago.). The problem occurs 2 to 4 times per day. The problem has been gradually improving. The patient states that diarrhea does not awaken her from sleep. Associated symptoms include abdominal pain (crampy) and vomiting (few times). Pertinent negatives include no arthralgias, bloating, chills, coughing, fever, headaches or weight loss. Exacerbated by: persistent nausea. Risk factors: father has had diarrhea for 4 weeks. She has tried bismuth subsalicylate for the symptoms. The treatment provided mild relief.      Review of Systems  Constitutional: Positive for appetite change. Negative for fever, chills, weight loss and activity change.  Respiratory: Negative for cough.   Cardiovascular: Negative for chest pain.  Gastrointestinal: Positive for vomiting (few times), abdominal pain (crampy) and diarrhea. Negative for bloating.  Musculoskeletal: Negative for arthralgias.  Neurological: Negative for headaches.       Objective:   Physical Exam  Vitals reviewed. Constitutional: She is oriented to person, place, and time. She appears well-developed and well-nourished. No distress.  HENT:  Head: Normocephalic.  Eyes: Conjunctivae are normal. Right eye exhibits no discharge. Left eye exhibits no discharge.  Neck: Neck supple.  Cardiovascular: Normal rate and regular rhythm.   No murmur heard. Pulmonary/Chest: Effort normal and breath sounds normal. No respiratory distress. She has no wheezes.  Abdominal: Soft. Bowel sounds are normal. She exhibits no distension and no mass. There is no tenderness. There is no rebound and no guarding.  Musculoskeletal: She exhibits no edema.  Neurological: She is alert and oriented to person, place, and  time.  Skin: Skin is warm and dry. There is erythema (rosacea acne face).  Psychiatric: She has a normal mood and affect. Her behavior is normal. Judgment and thought content normal.          Assessment & Plan:  1. Nausea, vomiting X1, Diarrhea May be r/t new med-lamictal, but household contact has had diarrhea for 4 wks. - Ova and Parasite screen (Stool) - ondansetron (ZOFRAN-ODT) 8 MG disintegrating tablet; Take 1 tablet (8 mg total) by mouth every 12 (twelve) hours as needed for nausea.  Dispense: 20 tablet; Refill: 0 F/u if nausea persistent or if new symtpoms.

## 2013-11-03 ENCOUNTER — Encounter: Payer: Self-pay | Admitting: Nurse Practitioner

## 2013-11-16 ENCOUNTER — Encounter: Payer: Self-pay | Admitting: *Deleted

## 2013-11-25 ENCOUNTER — Telehealth: Payer: Self-pay | Admitting: Nurse Practitioner

## 2013-11-25 NOTE — Telephone Encounter (Signed)
Patient's vm stated that she id not accepting calls at this moment. Will try patient again later.

## 2013-12-08 NOTE — Telephone Encounter (Signed)
Patient stated that she is doing better. Still has some diarrhea that comes and goes. Patient stated that she is not going to do the stool sample. Patient also stated that she is going to start ECT treatments soon with a psychiatrist.

## 2013-12-22 ENCOUNTER — Other Ambulatory Visit: Payer: Self-pay | Admitting: Family Medicine

## 2013-12-22 MED ORDER — OSELTAMIVIR PHOSPHATE 75 MG PO CAPS: 75.0000 mg | ORAL_CAPSULE | Freq: Every day | ORAL | Status: DC

## 2014-05-27 ENCOUNTER — Other Ambulatory Visit: Payer: Self-pay | Admitting: Obstetrics and Gynecology

## 2014-07-07 ENCOUNTER — Ambulatory Visit (INDEPENDENT_AMBULATORY_CARE_PROVIDER_SITE_OTHER): Payer: Medicaid Other | Admitting: Nurse Practitioner

## 2014-07-07 ENCOUNTER — Encounter: Payer: Self-pay | Admitting: Nurse Practitioner

## 2014-07-07 VITALS — BP 105/64 | HR 84 | Temp 98.3°F | Ht 62.0 in | Wt 102.0 lb

## 2014-07-07 DIAGNOSIS — Z113 Encounter for screening for infections with a predominantly sexual mode of transmission: Secondary | ICD-10-CM

## 2014-07-07 DIAGNOSIS — F341 Dysthymic disorder: Secondary | ICD-10-CM

## 2014-07-07 DIAGNOSIS — F32A Depression, unspecified: Secondary | ICD-10-CM

## 2014-07-07 DIAGNOSIS — F329 Major depressive disorder, single episode, unspecified: Secondary | ICD-10-CM

## 2014-07-07 DIAGNOSIS — F419 Anxiety disorder, unspecified: Secondary | ICD-10-CM

## 2014-07-07 DIAGNOSIS — Z202 Contact with and (suspected) exposure to infections with a predominantly sexual mode of transmission: Secondary | ICD-10-CM

## 2014-07-07 LAB — WET PREP, GENITAL
Trich, Wet Prep: NONE SEEN — AB
YEAST WET PREP: NONE SEEN — AB

## 2014-07-07 LAB — HCG, QUANTITATIVE, PREGNANCY: Quantitative HCG: 0.24 m[IU]/mL

## 2014-07-07 LAB — POCT URINE PREGNANCY: PREG TEST UR: NEGATIVE

## 2014-07-07 MED ORDER — METRONIDAZOLE 500 MG PO TABS: ORAL_TABLET | ORAL | Status: DC

## 2014-07-07 MED ORDER — CEFTRIAXONE SODIUM 1 G IJ SOLR: 1.0000 g | Freq: Once | INTRAMUSCULAR | Status: DC

## 2014-07-07 MED ORDER — CEFTRIAXONE SODIUM 1 G IJ SOLR: 250.0000 mg | Freq: Once | INTRAMUSCULAR | Status: DC

## 2014-07-07 MED ORDER — AZITHROMYCIN 250 MG PO TABS: ORAL_TABLET | ORAL | Status: DC

## 2014-07-07 NOTE — Patient Instructions (Addendum)
My office will call with results. Take azithromycin all at once with food. Take metronidazole all at once with food. You may take them at different meals. They may make you feel nauseous, so take with food. Contact psychiatry to let them know how you are feeling.   Sexually Transmitted Disease A sexually transmitted disease (STD) is a disease or infection that may be passed (transmitted) from person to person, usually during sexual activity. This may happen by way of saliva, semen, blood, vaginal mucus, or urine. Common STDs include:   Gonorrhea.   Chlamydia.   Syphilis.   HIV and AIDS.   Genital herpes.   Hepatitis B and C.   Trichomonas.   Human papillomavirus (HPV).   Pubic lice.   Scabies.  Mites.  Bacterial vaginosis. WHAT ARE CAUSES OF STDs? An STD may be caused by bacteria, a virus, or parasites. STDs are often transmitted during sexual activity if one person is infected. However, they may also be transmitted through nonsexual means. STDs may be transmitted after:   Sexual intercourse with an infected person.   Sharing sex toys with an infected person.   Sharing needles with an infected person or using unclean piercing or tattoo needles.  Having intimate contact with the genitals, mouth, or rectal areas of an infected person.   Exposure to infected fluids during birth. WHAT ARE THE SIGNS AND SYMPTOMS OF STDs? Different STDs have different symptoms. Some people may not have any symptoms. If symptoms are present, they may include:   Painful or bloody urination.   Pain in the pelvis, abdomen, vagina, anus, throat, or eyes.   A skin rash, itching, or irritation.  Growths, ulcerations, blisters, or sores in the genital and anal areas.  Abnormal vaginal discharge with or without bad odor.   Penile discharge in men.   Fever.   Pain or bleeding during sexual intercourse.   Swollen glands in the groin area.   Yellow skin and eyes  (jaundice). This is seen with hepatitis.   Swollen testicles.  Infertility.  Sores and blisters in the mouth. HOW ARE STDs DIAGNOSED? To make a diagnosis, your health care provider may:   Take a medical history.   Perform a physical exam.   Take a sample of any discharge to examine.  Swab the throat, cervix, opening to the penis, rectum, or vagina for testing.  Test a sample of your first morning urine.   Perform blood tests.   Perform a Pap test, if this applies.   Perform a colposcopy.   Perform a laparoscopy.  HOW ARE STDs TREATED? Treatment depends on the STD. Some STDs may be treated but not cured.   Chlamydia, gonorrhea, trichomonas, and syphilis can be cured with antibiotic medicine.   Genital herpes, hepatitis, and HIV can be treated, but not cured, with prescribed medicines. The medicines lessen symptoms.   Genital warts from HPV can be treated with medicine or by freezing, burning (electrocautery), or surgery. Warts may come back.   HPV cannot be cured with medicine or surgery. However, abnormal areas may be removed from the cervix, vagina, or vulva.   If your diagnosis is confirmed, your recent sexual partners need treatment. This is true even if they are symptom-free or have a negative culture or evaluation. They should not have sex until their health care providers say it is okay. HOW CAN I REDUCE MY RISK OF GETTING AN STD? Take these steps to reduce your risk of getting an STD:  Use latex  condoms, dental dams, and water-soluble lubricants during sexual activity. Do not use petroleum jelly or oils.  Avoid having multiple sex partners.  Do not have sex with someone who has other sex partners.  Do not have sex with anyone you do not know or who is at high risk for an STD.  Avoid risky sex practices that can break your skin.  Do not have sex if you have open sores on your mouth or skin.  Avoid drinking too much alcohol or taking illegal  drugs. Alcohol and drugs can affect your judgment and put you in a vulnerable position.  Avoid engaging in oral and anal sex acts.  Get vaccinated for HPV and hepatitis. If you have not received these vaccines in the past, talk to your health care provider about whether one or both might be right for you.   If you are at risk of being infected with HIV, it is recommended that you take a prescription medicine daily to prevent HIV infection. This is called pre-exposure prophylaxis (PrEP). You are considered at risk if:  You are a man who has sex with other men (MSM).  You are a heterosexual man or woman and are sexually active with more than one partner.  You take drugs by injection.  You are sexually active with a partner who has HIV.  Talk with your health care provider about whether you are at high risk of being infected with HIV. If you choose to begin PrEP, you should first be tested for HIV. You should then be tested every 3 months for as long as you are taking PrEP.  WHAT SHOULD I DO IF I THINK I HAVE AN STD?  See your health care provider.   Tell your sexual partner(s). They should be tested and treated for any STDs.  Do not have sex until your health care provider says it is okay. WHEN SHOULD I GET IMMEDIATE MEDICAL CARE? Contact your health care provider right away if:   You have severe abdominal pain.  You are a man and notice swelling or pain in your testicles.  You are a woman and notice swelling or pain in your vagina. Document Released: 03/09/2003 Document Revised: 12/22/2013 Document Reviewed: 07/07/2013 [facility] Patient Information 2015 [place], [place]. This information is not intended to replace advice given to you by your health care provider. Make sure you discuss any questions you have with your health care provider.

## 2014-07-07 NOTE — Progress Notes (Signed)
Pre visit review using our clinic review tool, if applicable. No additional management support is needed unless otherwise documented below in the visit note. 

## 2014-07-08 ENCOUNTER — Telehealth: Payer: Self-pay | Admitting: Nurse Practitioner

## 2014-07-08 DIAGNOSIS — IMO0002 Reserved for concepts with insufficient information to code with codable children: Secondary | ICD-10-CM | POA: Insufficient documentation

## 2014-07-08 LAB — GC/CHLAMYDIA PROBE AMP
CT PROBE, AMP APTIMA: NEGATIVE
GC Probe RNA: NEGATIVE

## 2014-07-08 LAB — RPR

## 2014-07-08 LAB — HIV ANTIBODY (ROUTINE TESTING W REFLEX): HIV: NONREACTIVE

## 2014-07-08 LAB — HEPATITIS B SURFACE ANTIGEN: Hepatitis B Surface Ag: NEGATIVE

## 2014-07-08 LAB — HEPATITIS C ANTIBODY: HCV AB: NEGATIVE
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
## 2014-07-08 NOTE — Telephone Encounter (Signed)
Spoke w/ pt: discussed lab results, need to be re-tested again for HIV in 6 mos. Encouraged to call gynecology to discuss copper IUD placement for contraception.  Will see counselor tomorrow.  Feeling relieved. Less anxious. Plans to call psychiatry for med adjustment. Discussed nutritional impact on depression. Encourage to make smoothies w/fresh fruit, yogurt, ice cream. Answered all questions.
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
## 2014-07-08 NOTE — Telephone Encounter (Signed)
pls call pt: Advise All std tests negative for disease. Blood preg test neg also.

## 2014-07-08 NOTE — Telephone Encounter (Signed)
Relevant patient education mailed to patient.  

## 2014-08-26 ENCOUNTER — Encounter: Payer: Self-pay | Admitting: Nurse Practitioner

## 2014-08-26 DIAGNOSIS — F1910 Other psychoactive substance abuse, uncomplicated: Secondary | ICD-10-CM | POA: Insufficient documentation

## 2014-09-07 LAB — OB RESULTS CONSOLE RUBELLA ANTIBODY, IGM: Rubella: IMMUNE

## 2014-09-07 LAB — OB RESULTS CONSOLE GC/CHLAMYDIA
CHLAMYDIA, DNA PROBE: NEGATIVE
GC PROBE AMP, GENITAL: NEGATIVE

## 2014-09-07 LAB — OB RESULTS CONSOLE RPR: RPR: NONREACTIVE

## 2014-09-07 LAB — OB RESULTS CONSOLE HIV ANTIBODY (ROUTINE TESTING): HIV: NONREACTIVE

## 2014-10-11 ENCOUNTER — Encounter: Payer: Medicaid Other | Admitting: Nurse Practitioner

## 2014-10-12 ENCOUNTER — Encounter: Payer: Self-pay | Admitting: Nurse Practitioner

## 2014-10-12 ENCOUNTER — Ambulatory Visit (INDEPENDENT_AMBULATORY_CARE_PROVIDER_SITE_OTHER): Payer: Medicaid Other | Admitting: Nurse Practitioner

## 2014-10-12 VITALS — BP 97/63 | HR 105 | Temp 98.6°F | Ht 62.0 in | Wt 112.0 lb

## 2014-10-12 DIAGNOSIS — Z3490 Encounter for supervision of normal pregnancy, unspecified, unspecified trimester: Secondary | ICD-10-CM

## 2014-10-12 DIAGNOSIS — Z111 Encounter for screening for respiratory tuberculosis: Secondary | ICD-10-CM

## 2014-10-12 DIAGNOSIS — Z0000 Encounter for general adult medical examination without abnormal findings: Secondary | ICD-10-CM

## 2014-10-12 NOTE — Progress Notes (Signed)
Pre visit review using our clinic review tool, if applicable. No additional management support is needed unless otherwise documented below in the visit note. 

## 2014-10-12 NOTE — Patient Instructions (Signed)
Please return between 48 & 72 hours for TB test read. We will return your form at that time.  Best wishes with pregnancy.  Nice to see you!

## 2014-10-12 NOTE — Assessment & Plan Note (Signed)
Vaccines UTD Pt has form 1 page for work. Seeing [facility]-current pregnancy, 11 weeks. Recent labs at [facility]-all nml except TSH not therapeutic & pos for opiates. Nml BMI.

## 2014-10-12 NOTE — Progress Notes (Signed)
Subjective:     Monica Stephens is a 33 y.o. female and is here for a comprehensive physical exam. She request 1 page form to be filled out for new employer. Ms Monica Stephens recently took position w/[employer]. The patient reports pregnancy-11 weeks. She is seeing [facility]. and has started prenatal care. She sees [facility] for hypothyroidism post thyroid cancer treatment: thyroid meds were recently adjusted. She sees psychiatry for MMD. Her meds were recently adjusted. She gained 10 lbs. Since last OV. BMI now nml. Pt attributes weight gain to improvement in depression.  History   Social History  . Marital Status: Divorced    Spouse Name: N/A    Number of Children: 3  . Years of Education: N/A   Occupational History  . qualified professional     [employer]   Social History Main Topics  . Smoking status: Current Every Day Smoker -- 1.00 packs/day for 2 years    Types: Cigarettes  . Smokeless tobacco: Never Used  . Alcohol Use: 1.2 oz/week    2 Glasses of wine per week     Comment: Previousy drank 1 bottle of wine every other weekend when she doesn't have her kids.  . Drug Use: No  . Sexual Activity: Not on file   Other Topics Concern  . Not on file   Social History Narrative   Divorced. Has 3 children. Ex-husband has custody every other weekend. Lives with parents in [place], and sister & neice (as of 06/2012).   Works at [employer]   .  +Smoker.   Health Maintenance  Topic Date Due  . Influenza Vaccine  07/31/2014  . Pap Smear  10/05/2016  . Tetanus/tdap  01/08/2022    The following portions of the patient's history were reviewed and updated as appropriate: allergies, current medications, past medical history, past social history, past surgical history and problem list.  Review of Systems Pertinent items are noted in HPI.   Objective:    BP 97/63  Pulse 105  Temp(Src) 98.6 F (37 C) (Temporal)  Ht 5\' 2"  (1.575 m)  Wt 112 lb (50.803 kg)   BMI 20.48 kg/m2  SpO2 100% General appearance: alert, cooperative, appears stated age and no distress Head: Normocephalic, without obvious abnormality, atraumatic Eyes: negative findings: lids and lashes normal, conjunctivae and sclerae normal, corneas clear and pupils equal, round, reactive to light and accomodation Ears: normal TM's and external ear canals both ears Throat: lips, mucosa, and tongue normal; teeth and gums normal Lungs: clear to auscultation bilaterally Heart: regular rate and rhythm, S1, S2 normal, no murmur, click, rub or gallop Abdomen: soft, non-tender; bowel sounds normal; no masses,  no organomegaly Extremities: extremities normal, atraumatic, no cyanosis or edema Pulses: 2+ and symmetric Skin: blue nevi R shoulder, round, some irregularity in border, about 4 mm. Blue nevi hairline R forehead about 3 mm Lymph nodes: no cervical or [place] LAD  Neurologic: Grossly normal    Assessment:   1. Visit for TB skin test - TB Skin Test  2. Preventative health care  3. Pregnancy  See problem list for complete A&P See pt instructions. F/u 1 yr

## 2014-10-14 LAB — TB SKIN TEST
Induration: 0 mm
TB Skin Test: NEGATIVE

## 2014-12-08 ENCOUNTER — Ambulatory Visit (INDEPENDENT_AMBULATORY_CARE_PROVIDER_SITE_OTHER): Payer: Medicaid Other | Admitting: Nurse Practitioner

## 2014-12-08 ENCOUNTER — Telehealth: Payer: Self-pay | Admitting: Nurse Practitioner

## 2014-12-08 ENCOUNTER — Encounter: Payer: Self-pay | Admitting: Nurse Practitioner

## 2014-12-08 VITALS — BP 101/66 | HR 93 | Temp 97.8°F | Resp 18 | Ht 62.0 in | Wt 123.0 lb

## 2014-12-08 DIAGNOSIS — J029 Acute pharyngitis, unspecified: Secondary | ICD-10-CM

## 2014-12-08 LAB — POCT RAPID STREP A (OFFICE): Rapid Strep A Screen: NEGATIVE

## 2014-12-08 NOTE — Telephone Encounter (Signed)
Spoke with pt, advised message from [place]. Pt understood. 

## 2014-12-08 NOTE — Patient Instructions (Signed)
You may use benzocaine throat lozenges or throat spray for comfort.This is likely a viral sore throat/upper respiratory infection. However, if your culture comes back growing bacteria, I will call in an antibiotic. In the meantime, you may use benzocaine throat lozenges or throat spray for comfort. Salt water gargles (1/4 cup warm water mixed with 1/4 tsp salt) twice daily & listerene gargles twice daily. If you develop runny nose, start Neilmed sinus rinses daily.  Rest, sip fluids every hour.  Feel better!  Sore Throat A sore throat is pain, burning, irritation, or scratchiness of the throat. There is often pain or tenderness when swallowing or talking. A sore throat may be accompanied by other symptoms, such as coughing, sneezing, fever, and swollen neck glands. A sore throat is often the first sign of another sickness, such as a cold, flu, strep throat, or mononucleosis (commonly known as mono). Most sore throats go away without medical treatment. CAUSES  The most common causes of a sore throat include:  A viral infection, such as a cold, flu, or mono.  A bacterial infection, such as strep throat, tonsillitis, or whooping cough.  Seasonal allergies.  Dryness in the air.  Irritants, such as smoke or pollution.  Gastroesophageal reflux disease (GERD). HOME CARE INSTRUCTIONS   Only take over-the-counter medicines as directed by your caregiver.  Drink enough fluids to keep your urine clear or pale yellow.  Rest as needed.  Try using throat sprays, lozenges, or sucking on hard candy to ease any pain (if older than 4 years or as directed).  Sip warm liquids, such as broth, herbal tea, or warm water with honey to relieve pain temporarily. You may also eat or drink cold or frozen liquids such as frozen ice pops.  Gargle with salt water (mix 1 tsp salt with 8 oz of water).  Do not smoke and avoid secondhand smoke.  Put a cool-mist humidifier in your bedroom at night to moisten the air.  You can also turn on a hot shower and sit in the bathroom with the door closed for 5 10 minutes. SEEK IMMEDIATE MEDICAL CARE IF:  You have difficulty breathing.  You are unable to swallow fluids, soft foods, or your saliva.  You have increased swelling in the throat.  Your sore throat does not get better in 7 days.  You have nausea and vomiting.  You have a fever or persistent symptoms for more than 2 3 days.  You have a fever and your symptoms suddenly get worse. MAKE SURE YOU:   Understand these instructions.  Will watch your condition.  Will get help right away if you are not doing well or get worse. Document Released: 01/24/2005 Document Revised: 12/03/2012 Document Reviewed: 08/24/2012 [facility] Patient Information 2014 [place], [place].

## 2014-12-08 NOTE — Progress Notes (Signed)
Pre visit review using our clinic review tool, if applicable. No additional management support is needed unless otherwise documented below in the visit note. 

## 2014-12-08 NOTE — Progress Notes (Signed)
   Subjective:    Patient ID: Monica Stephens, female    DOB: October 31, 1981, 33 y.o.   MRN: [id number]  HPI Comments: Pt is [redacted] weeks pregnant  Sore Throat  This is a new problem. The current episode started today. The problem has been unchanged. Neither side of throat is experiencing more pain than the other. There has been no fever. The pain is moderate. Associated symptoms include abdominal pain (cramps prior to diarhhea) and diarrhea. Pertinent negatives include no congestion, coughing or headaches. She has had exposure to strep. Exposure to: daughter is treated. She has tried NSAIDs for the symptoms. The treatment provided no relief.      Review of Systems  Constitutional: Negative for fever and fatigue.  HENT: Positive for sore throat. Negative for congestion and postnasal drip.   Respiratory: Negative for cough.   Gastrointestinal: Positive for abdominal pain (cramps prior to diarhhea) and diarrhea.  Neurological: Negative for headaches.       Objective:   Physical Exam  Constitutional: She is oriented to person, place, and time. She appears well-developed and well-nourished. No distress.  HENT:  Head: Normocephalic and atraumatic.  Right Ear: External ear normal.  Left Ear: External ear normal.  Mouth/Throat: Oropharynx is clear and moist. No oropharyngeal exudate.  Eyes: Conjunctivae are normal. Right eye exhibits no discharge. Left eye exhibits no discharge.  Neck: Normal range of motion. Neck supple. No thyromegaly present.  Cardiovascular: Normal rate, regular rhythm and normal heart sounds.   No murmur heard. Pulmonary/Chest: Effort normal and breath sounds normal. No respiratory distress. She has no wheezes. She has no rales.  Lymphadenopathy:    She has no cervical adenopathy.  Neurological: She is alert and oriented to person, place, and time.  Skin: Skin is warm and dry.  Psychiatric: She has a normal mood and affect. Her behavior is normal. Thought content normal.    Vitals reviewed.         Assessment & Plan:  1. Sore throat diarrhea - Upper Respiratory Culture - POCT rapid strep A-neg No NSAIDS while pregnant Symptom management See patient instructions for complete plan.

## 2014-12-08 NOTE — Telephone Encounter (Signed)
pls call pt: pls remind her it is advised not to take ibuprophen while pregnant. She may use tylenol.

## 2014-12-10 ENCOUNTER — Telehealth: Payer: Self-pay | Admitting: Nurse Practitioner

## 2014-12-10 NOTE — Telephone Encounter (Signed)
Spoke with pt, advised message from [place]. Pt understood. 

## 2014-12-10 NOTE — Telephone Encounter (Signed)
pls call pt: Advise Strep culture neg. Viral respiratory/sore throat illness. Continue with comfort measures as discussed. 

## 2014-12-12 LAB — CULTURE, UPPER RESPIRATORY

## 2014-12-13 ENCOUNTER — Telehealth: Payer: Self-pay | Admitting: Nurse Practitioner

## 2014-12-13 DIAGNOSIS — J020 Streptococcal pharyngitis: Secondary | ICD-10-CM

## 2014-12-13 MED ORDER — AMOXICILLIN 875 MG PO TABS: 875.0000 mg | ORAL_TABLET | Freq: Two times a day (BID) | ORAL | Status: DC

## 2014-12-13 NOTE — Telephone Encounter (Signed)
pls call pt: Monica Stephens She has strep infection, it isn't the type that is typically treated with ABX, if she is still feeling bad, she should start meds. I sent in amoxicillin.

## 2014-12-13 NOTE — Telephone Encounter (Signed)
Patient notified of results.

## 2014-12-14 NOTE — Telephone Encounter (Signed)
Typically, She is infectious until 24 hours after starting ABX. Give note for work if needed.

## 2014-12-14 NOTE — Telephone Encounter (Signed)
Patient notified

## 2014-12-29 ENCOUNTER — Encounter (HOSPITAL_COMMUNITY): Payer: Self-pay

## 2014-12-29 ENCOUNTER — Inpatient Hospital Stay (HOSPITAL_COMMUNITY)
Admission: AD | Admit: 2014-12-29 | Discharge: 2014-12-29 | Disposition: A | Discharge status: Home / Self Care | Payer: Medicaid Other | Source: Ambulatory Visit | Attending: Obstetrics & Gynecology | Admitting: Obstetrics & Gynecology

## 2014-12-29 DIAGNOSIS — F17210 Nicotine dependence, cigarettes, uncomplicated: Secondary | ICD-10-CM | POA: Diagnosis not present

## 2014-12-29 DIAGNOSIS — Z3A22 22 weeks gestation of pregnancy: Secondary | ICD-10-CM | POA: Diagnosis not present

## 2014-12-29 DIAGNOSIS — O99332 Smoking (tobacco) complicating pregnancy, second trimester: Secondary | ICD-10-CM | POA: Diagnosis not present

## 2014-12-29 DIAGNOSIS — O368121 Decreased fetal movements, second trimester, fetus 1: Secondary | ICD-10-CM

## 2014-12-29 DIAGNOSIS — O368120 Decreased fetal movements, second trimester, not applicable or unspecified: Secondary | ICD-10-CM | POA: Insufficient documentation

## 2014-12-29 NOTE — MAU Note (Signed)
Patient states she has been feeling a lot of fetal movement. Less since yesterday. Fetal heart tones in triage in the 140's. Denies pain, bleeding or leaking.
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
## 2014-12-31 NOTE — L&D Delivery Note (Signed)
Delivery Note--Relinquishing Mother At 9:22 AM a viable female was delivered via Vaginal, Spontaneous Delivery (Presentation: Left Occiput Anterior).  APGAR: 9, 9; weight 7 lb 13.9 oz (3570 g).   Placenta status: Intact, Spontaneous.  Cord: 3 vessels with the following complications: None.  Cord pH: NA  Anesthesia: Epidural  Episiotomy: None Lacerations: None Suture Repair: None Est. Blood Loss (mL): 0--no blood loss at delivery  Mom to Med-Surg.  Baby to Nursery. Patient desires d/c later today if stable.  Adoptive family will be coming to hospital to complete paperwork this afternoon. Patient Rh negative--antibody screen negative on admission.  Reviewed options for addressing Rh negative status and currently unknown Rh status of the baby. Patient elects to receive Rhophylac today prior to anticipated d/c this evening.  Donnel Saxon 05/10/2015, 10:41 AM

## 2015-02-22 LAB — OB RESULTS CONSOLE HEPATITIS B SURFACE ANTIGEN
HEP B S AG: POSITIVE
HEP B S AG: NEGATIVE

## 2015-04-14 LAB — OB RESULTS CONSOLE GBS: STREP GROUP B AG: NEGATIVE
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
## 2015-05-09 ENCOUNTER — Encounter (HOSPITAL_COMMUNITY): Payer: Self-pay

## 2015-05-09 ENCOUNTER — Inpatient Hospital Stay (HOSPITAL_COMMUNITY)
Admission: RE | Admit: 2015-05-09 | Discharge: 2015-05-10 | DRG: 775 | Disposition: A | Discharge status: Home / Self Care | Payer: Medicaid Other | Source: Ambulatory Visit | Attending: Obstetrics and Gynecology | Admitting: Obstetrics and Gynecology

## 2015-05-09 DIAGNOSIS — Z91410 Personal history of adult physical and sexual abuse: Secondary | ICD-10-CM | POA: Diagnosis not present

## 2015-05-09 DIAGNOSIS — O99344 Other mental disorders complicating childbirth: Secondary | ICD-10-CM | POA: Diagnosis present

## 2015-05-09 DIAGNOSIS — F411 Generalized anxiety disorder: Secondary | ICD-10-CM | POA: Diagnosis present

## 2015-05-09 DIAGNOSIS — Z9049 Acquired absence of other specified parts of digestive tract: Secondary | ICD-10-CM | POA: Diagnosis present

## 2015-05-09 DIAGNOSIS — Z85850 Personal history of malignant neoplasm of thyroid: Secondary | ICD-10-CM | POA: Diagnosis not present

## 2015-05-09 DIAGNOSIS — O480 Post-term pregnancy: Secondary | ICD-10-CM | POA: Diagnosis present

## 2015-05-09 DIAGNOSIS — O99334 Smoking (tobacco) complicating childbirth: Secondary | ICD-10-CM | POA: Diagnosis present

## 2015-05-09 DIAGNOSIS — F4310 Post-traumatic stress disorder, unspecified: Secondary | ICD-10-CM | POA: Diagnosis present

## 2015-05-09 DIAGNOSIS — E890 Postprocedural hypothyroidism: Secondary | ICD-10-CM | POA: Diagnosis present

## 2015-05-09 DIAGNOSIS — Z3A40 40 weeks gestation of pregnancy: Secondary | ICD-10-CM | POA: Diagnosis present

## 2015-05-09 DIAGNOSIS — F17210 Nicotine dependence, cigarettes, uncomplicated: Secondary | ICD-10-CM | POA: Diagnosis present

## 2015-05-09 DIAGNOSIS — O99284 Endocrine, nutritional and metabolic diseases complicating childbirth: Secondary | ICD-10-CM | POA: Diagnosis present

## 2015-05-09 DIAGNOSIS — F339 Major depressive disorder, recurrent, unspecified: Secondary | ICD-10-CM | POA: Diagnosis present

## 2015-05-09 DIAGNOSIS — Z6791 Unspecified blood type, Rh negative: Secondary | ICD-10-CM | POA: Diagnosis present

## 2015-05-09 DIAGNOSIS — Z3490 Encounter for supervision of normal pregnancy, unspecified, unspecified trimester: Secondary | ICD-10-CM

## 2015-05-09 LAB — CBC
HCT: 32.6 % — ABNORMAL LOW (ref 36.0–46.0)
HEMOGLOBIN: 10.8 g/dL — AB (ref 12.0–15.0)
MCH: 28.8 pg (ref 26.0–34.0)
MCHC: 33.1 g/dL (ref 30.0–36.0)
MCV: 86.9 fL (ref 78.0–100.0)
PLATELETS: 165 10*3/uL (ref 150–400)
RBC: 3.75 MIL/uL — AB (ref 3.87–5.11)
RDW: 14 % (ref 11.5–15.5)
WBC: 9.4 10*3/uL (ref 4.0–10.5)

## 2015-05-09 LAB — RPR: RPR Ser Ql: NONREACTIVE

## 2015-05-09 LAB — TYPE AND SCREEN
ABO/RH(D): A NEG
ANTIBODY SCREEN: NEGATIVE

## 2015-05-09 MED ORDER — CITRIC ACID-SODIUM CITRATE 334-500 MG/5ML PO SOLN: 30.0000 mL | ORAL | Status: DC | PRN

## 2015-05-09 MED ORDER — LIDOCAINE HCL (PF) 1 % IJ SOLN: 30.0000 mL | INTRAMUSCULAR | Status: DC | PRN

## 2015-05-09 MED ORDER — TERBUTALINE SULFATE 1 MG/ML IJ SOLN: 0.2500 mg | Freq: Once | INTRAMUSCULAR | Status: AC | PRN

## 2015-05-09 MED ORDER — OXYTOCIN BOLUS FROM INFUSION: 500.0000 mL | INTRAVENOUS | Status: DC

## 2015-05-09 MED ORDER — ONDANSETRON HCL 4 MG/2ML IJ SOLN: 4.0000 mg | Freq: Four times a day (QID) | INTRAMUSCULAR | Status: DC | PRN

## 2015-05-09 MED ORDER — LACTATED RINGERS IV SOLN
INTRAVENOUS | Status: DC
  Administered 2015-05-09 – 2015-05-10 (×3): via INTRAVENOUS

## 2015-05-09 MED ORDER — OXYTOCIN 40 UNITS IN LACTATED RINGERS INFUSION - SIMPLE MED
62.5000 mL/h | INTRAVENOUS | Status: DC
  Administered 2015-05-10: 62.5 mL/h via INTRAVENOUS

## 2015-05-09 MED ORDER — FENTANYL 2.5 MCG/ML BUPIVACAINE 1/10 % EPIDURAL INFUSION (WH - ANES)
14.0000 mL/h | INTRAMUSCULAR | Status: DC | PRN
  Administered 2015-05-10 (×2): 14 mL/h via EPIDURAL
  Administered 2015-05-10: 13 mL/h via EPIDURAL
  Filled 2015-05-09 (×2): qty 125

## 2015-05-09 MED ORDER — OXYCODONE-ACETAMINOPHEN 5-325 MG PO TABS: 1.0000 | ORAL_TABLET | ORAL | Status: DC | PRN

## 2015-05-09 MED ORDER — LACTATED RINGERS IV SOLN: 500.0000 mL | INTRAVENOUS | Status: DC | PRN

## 2015-05-09 MED ORDER — NALBUPHINE HCL 10 MG/ML IJ SOLN
5.0000 mg | INTRAMUSCULAR | Status: DC | PRN
  Administered 2015-05-09: 5 mg via INTRAVENOUS
  Filled 2015-05-09: qty 1

## 2015-05-09 MED ORDER — HYDROXYZINE HCL 50 MG PO TABS: 50.0000 mg | ORAL_TABLET | Freq: Four times a day (QID) | ORAL | Status: DC | PRN

## 2015-05-09 MED ORDER — BUTORPHANOL TARTRATE 1 MG/ML IJ SOLN
1.0000 mg | INTRAMUSCULAR | Status: DC | PRN
  Administered 2015-05-09 (×2): 1 mg via INTRAVENOUS
  Filled 2015-05-09 (×2): qty 1

## 2015-05-09 MED ORDER — OXYTOCIN 40 UNITS IN LACTATED RINGERS INFUSION - SIMPLE MED
1.0000 m[IU]/min | INTRAVENOUS | Status: DC
  Administered 2015-05-09: 2 m[IU]/min via INTRAVENOUS
  Administered 2015-05-10: 8 m[IU]/min via INTRAVENOUS
  Filled 2015-05-09 (×2): qty 1000

## 2015-05-09 MED ORDER — EPHEDRINE 5 MG/ML INJ
10.0000 mg | INTRAVENOUS | Status: DC | PRN
  Filled 2015-05-09: qty 4

## 2015-05-09 MED ORDER — ACETAMINOPHEN 325 MG PO TABS
650.0000 mg | ORAL_TABLET | ORAL | Status: DC | PRN
  Administered 2015-05-09: 650 mg via ORAL
  Filled 2015-05-09: qty 2

## 2015-05-09 MED ORDER — OXYCODONE-ACETAMINOPHEN 5-325 MG PO TABS: 2.0000 | ORAL_TABLET | ORAL | Status: DC | PRN

## 2015-05-09 MED ORDER — PHENYLEPHRINE 40 MCG/ML (10ML) SYRINGE FOR IV PUSH (FOR BLOOD PRESSURE SUPPORT)
80.0000 ug | PREFILLED_SYRINGE | INTRAVENOUS | Status: DC | PRN
  Filled 2015-05-09: qty 20

## 2015-05-09 MED ORDER — PROMETHAZINE HCL 25 MG/ML IJ SOLN
25.0000 mg | Freq: Four times a day (QID) | INTRAMUSCULAR | Status: DC | PRN
  Administered 2015-05-09: 25 mg via INTRAVENOUS
  Filled 2015-05-09: qty 1

## 2015-05-09 MED ORDER — DIPHENHYDRAMINE HCL 50 MG/ML IJ SOLN: 12.5000 mg | INTRAMUSCULAR | Status: DC | PRN

## 2015-05-09 NOTE — Progress Notes (Addendum)
  Subjective: Assumed care at 19:00 PM of this 34 yo G5P3013 @ 40.5 wks here for social induction. Baby up for adoption. +FM. Denies LOF or VB. Family at bedside. +Back pain. Reports only 15 min of relief w/ Nubain - would like to try Stadol as this was effective in past. Wants to avoid epidural, but is keeping that option open if Stadol is ineffective. Standing/rocking and using birthing ball for additional comfort. Reports tension in shoulders, unable to relax. Fatigued - ready to be done - wants to continue w/ induction.   Objective: BP 132/77 mmHg  Pulse 77  Temp(Src) 98 F (36.7 C) (Oral)  Resp 18  Ht 5\' 2"  (1.575 m)  Wt 71.215 kg (157 lb)  BMI 28.71 kg/m2  LMP 06/07/2014     Today's Vitals   05/09/15 1800 05/09/15 1802 05/09/15 2010 05/09/15 2056  BP:  131/68 124/80 132/77  Pulse:  67 77 77  Temp:      TempSrc:      Resp: 18  18 18   Height:      Weight:      PainSc: 4       FHT: BL 140 w/ moderate variability, no accels, no decels UC:   irregular, every 2-2 1/2 minutes, palpate mod-strong SVE:   Dilation: 3 Effacement (%): Thick Station: -3 Exam by:: Murtis Sink CNM Pitocin at 16 mU/min Cervix firm and very posterior - too high for amniotomy   Assessment:  IUP at 40.5 wks Full term pregnancy - was a scheduled IOL on Wed - brought in today for social reasons GBS neg 1st stage labor Rh neg Baby up for adoption Hx of depression/anxiety/bipolar Hypothyroidism Smoker Desires early discharge  Plan: Reviewed w/ pt cervical status and too high for amniotomy. Discussed need to relax and that induction will take a while - offered rest period, but declined. She verbalized understanding of status. Will continue induction per her request.   Stadol/Phenergan. Epidural as desired. Continue current plan.  Farrel Gordon CNM 05/09/2015, 10:13 PM

## 2015-05-09 NOTE — Progress Notes (Signed)
Labor Progress  Subjective: Breathing with each ctx.  Sitting in th rocking chair.  Family at the bedside  Objective: BP 136/76 mmHg  Pulse 84  Temp(Src) 98 F (36.7 C) (Oral)  Resp 18  Ht 5\' 2"  (1.575 m)  Wt 157 lb (71.215 kg)  BMI 28.71 kg/m2  LMP 06/07/2014     FHT: 145 moderate variability, + accel, no decel CTX:  regular, every 2-4 minutes, moderate Uterus gravid, soft non tender SVE:  Dilation: 2.5 Effacement (%): Thick Station: -3 Exam by:: V. Sadarius Norman CNM Pitocin at 3mUn/min  Assessment:  IUP at 40.5 weeks NICHD: Category Membranes:  intact Labor progress: InOL Pitocin Augmentation GBS: negative   Plan: Continue labor plan Continuous monitoring Rest/Ambulate Frequent position changes to facilitate fetal rotation and descent. Will reassess with cervical exam at 1500 or earlier if necessary Continue pitocin per protocol      Sudeep Scheibel, CNM, MSN 05/09/2015. 1:42 PM

## 2015-05-09 NOTE — Progress Notes (Signed)
Labor Progress  Subjective: Pt report and increase of pain with each ctx.  She is sitting in the chair, managing thru breathing, positive reinforcement and support by the family.  Objective: BP 121/74 mmHg  Pulse 76  Temp(Src) 98 F (36.7 C) (Oral)  Resp 18  Ht 5\' 2"  (1.575 m)  Wt 157 lb (71.215 kg)  BMI 28.71 kg/m2  LMP 06/07/2014     FHT: 145 moderate variability, + accel, no decel   CTX:  regular, every 3-5 minutes Uterus gravid, soft non tender SVE:  Dilation: 2.5 Effacement (%): Thick Station: -3 Exam by:: V. Elita Dame CNM Pitocin at 70mUn/min  Assessment:  IUP at 40.5 weeks NICHD: Category Membranes: intact Labor progress: IOL Pitocin Augmentation GBS: negative   Plan: Continue labor plan Continuous monitoring Rest/Ambulate Frequent position changes to facilitate fetal rotation and descent. Will reassess with cervical exam at 1830 or earlier if necessary Continue pitocin per protocol      Kristofor Michalowski, CNM, MSN 05/09/2015. 4:02 PM

## 2015-05-10 ENCOUNTER — Inpatient Hospital Stay (HOSPITAL_COMMUNITY): Payer: Medicaid Other | Admitting: Anesthesiology

## 2015-05-10 ENCOUNTER — Encounter (HOSPITAL_COMMUNITY): Payer: Self-pay

## 2015-05-10 DIAGNOSIS — Z6791 Unspecified blood type, Rh negative: Secondary | ICD-10-CM | POA: Diagnosis present

## 2015-05-10 LAB — HIV ANTIBODY (ROUTINE TESTING W REFLEX): HIV SCREEN 4TH GENERATION: NONREACTIVE

## 2015-05-10 MED ORDER — DIBUCAINE 1 % RE OINT
1.0000 "application " | TOPICAL_OINTMENT | RECTAL | Status: DC | PRN
  Filled 2015-05-10: qty 28

## 2015-05-10 MED ORDER — OXYCODONE-ACETAMINOPHEN 5-325 MG PO TABS: 2.0000 | ORAL_TABLET | ORAL | Status: DC | PRN

## 2015-05-10 MED ORDER — FENTANYL 2.5 MCG/ML BUPIVACAINE 1/10 % EPIDURAL INFUSION (WH - ANES): 13.0000 mL/h | INTRAMUSCULAR | Status: DC | PRN

## 2015-05-10 MED ORDER — MIRTAZAPINE 15 MG PO TBDP
15.0000 mg | ORAL_TABLET | Freq: Every day | ORAL | Status: DC
  Filled 2015-05-10: qty 1

## 2015-05-10 MED ORDER — SENNOSIDES-DOCUSATE SODIUM 8.6-50 MG PO TABS: 2.0000 | ORAL_TABLET | ORAL | Status: DC

## 2015-05-10 MED ORDER — BENZOCAINE-MENTHOL 20-0.5 % EX AERO
1.0000 "application " | INHALATION_SPRAY | CUTANEOUS | Status: DC | PRN
  Filled 2015-05-10: qty 56

## 2015-05-10 MED ORDER — LIDOCAINE HCL (PF) 1 % IJ SOLN
INTRAMUSCULAR | Status: DC | PRN
  Administered 2015-05-10: 4 mL
  Administered 2015-05-10: 3 mL

## 2015-05-10 MED ORDER — LEVOTHYROXINE SODIUM 150 MCG PO TABS: 150.0000 ug | ORAL_TABLET | ORAL | Status: DC

## 2015-05-10 MED ORDER — ONDANSETRON HCL 4 MG PO TABS: 4.0000 mg | ORAL_TABLET | ORAL | Status: DC | PRN

## 2015-05-10 MED ORDER — LANOLIN HYDROUS EX OINT: TOPICAL_OINTMENT | CUTANEOUS | Status: DC | PRN

## 2015-05-10 MED ORDER — TETANUS-DIPHTH-ACELL PERTUSSIS 5-2.5-18.5 LF-MCG/0.5 IM SUSP: 0.5000 mL | Freq: Once | INTRAMUSCULAR | Status: DC

## 2015-05-10 MED ORDER — DIPHENHYDRAMINE HCL 25 MG PO CAPS: 25.0000 mg | ORAL_CAPSULE | Freq: Four times a day (QID) | ORAL | Status: DC | PRN

## 2015-05-10 MED ORDER — MIRTAZAPINE 15 MG PO TABS
15.0000 mg | ORAL_TABLET | Freq: Every day | ORAL | Status: DC
  Filled 2015-05-10: qty 1

## 2015-05-10 MED ORDER — LACTATED RINGERS IV SOLN: INTRAVENOUS | Status: DC

## 2015-05-10 MED ORDER — WITCH HAZEL-GLYCERIN EX PADS: 1.0000 "application " | MEDICATED_PAD | CUTANEOUS | Status: DC | PRN

## 2015-05-10 MED ORDER — OXYCODONE-ACETAMINOPHEN 5-325 MG PO TABS
1.0000 | ORAL_TABLET | ORAL | Status: DC | PRN
Start: 2015-05-10 — End: 2015-05-11
  Administered 2015-05-10: 1 via ORAL
  Filled 2015-05-10: qty 1

## 2015-05-10 MED ORDER — OXYCODONE-ACETAMINOPHEN 5-325 MG PO TABS: 1.0000 | ORAL_TABLET | ORAL | Status: DC | PRN

## 2015-05-10 MED ORDER — IBUPROFEN 600 MG PO TABS: 600.0000 mg | ORAL_TABLET | Freq: Four times a day (QID) | ORAL | Status: DC

## 2015-05-10 MED ORDER — PRENATAL MULTIVITAMIN CH
1.0000 | ORAL_TABLET | Freq: Every day | ORAL | Status: DC
  Administered 2015-05-10: 1 via ORAL
  Filled 2015-05-10: qty 1

## 2015-05-10 MED ORDER — ZOLPIDEM TARTRATE 5 MG PO TABS: 5.0000 mg | ORAL_TABLET | Freq: Every evening | ORAL | Status: DC | PRN

## 2015-05-10 MED ORDER — ONDANSETRON HCL 4 MG/2ML IJ SOLN: 4.0000 mg | INTRAMUSCULAR | Status: DC | PRN

## 2015-05-10 MED ORDER — SIMETHICONE 80 MG PO CHEW: 80.0000 mg | CHEWABLE_TABLET | ORAL | Status: DC | PRN

## 2015-05-10 MED ORDER — ACETAMINOPHEN 325 MG PO TABS: 650.0000 mg | ORAL_TABLET | ORAL | Status: DC | PRN

## 2015-05-10 MED ORDER — IBUPROFEN 600 MG PO TABS
600.0000 mg | ORAL_TABLET | Freq: Four times a day (QID) | ORAL | Status: DC
  Administered 2015-05-10 (×2): 600 mg via ORAL
  Filled 2015-05-10 (×2): qty 1

## 2015-05-10 MED ORDER — LEVOTHYROXINE SODIUM 150 MCG PO TABS
150.0000 ug | ORAL_TABLET | ORAL | Status: DC
  Filled 2015-05-10 (×2): qty 1

## 2015-05-10 MED ORDER — LEVOTHYROXINE SODIUM 25 MCG PO TABS: 225.0000 ug | ORAL_TABLET | ORAL | Status: DC

## 2015-05-10 MED ORDER — HYDROXYZINE HCL 25 MG PO TABS
25.0000 mg | ORAL_TABLET | ORAL | Status: DC | PRN
  Filled 2015-05-10: qty 1

## 2015-05-10 MED ORDER — RHO D IMMUNE GLOBULIN 1500 UNIT/2ML IJ SOSY
300.0000 ug | PREFILLED_SYRINGE | Freq: Once | INTRAMUSCULAR | Status: AC
  Administered 2015-05-10: 300 ug via INTRAVENOUS
  Filled 2015-05-10: qty 2

## 2015-05-10 NOTE — Progress Notes (Signed)
  Subjective: Still uncomfortable in lower pelvis, but coping.  Bothered by numbness of legs.  "Just ready to have this over with".  Objective: BP 122/77 mmHg  Pulse 84  Temp(Src) 98.6 F (37 C) (Oral)  Resp 20  Ht 5\' 2"  (1.575 m)  Wt 71.215 kg (157 lb)  BMI 28.71 kg/m2  SpO2 100%  LMP 06/07/2014 I/O last 3 completed shifts: In: -  Out: 525 [Urine:525]    FHT: Category 1 UC:   regular, every 3 minutes SVE:   Dilation: 8 Effacement (%): 80 Station: 0 Exam by:: Braden Cimo cnm Pitocin at 8 mu/min IUPC not tracing well, despite readjustment/flushing/recalibrating  Assessment:  Progressive labor BUFA Anxiety  Plan: Reassured regarding progress Support for anxiety and relinquishing mother status. Anticipate SVB.  Donnel Saxon CNM 05/10/2015, 8:45 AM

## 2015-05-10 NOTE — Discharge Instructions (Signed)
Postpartum Care After Vaginal Delivery After you deliver your newborn (postpartum period), the usual stay in the hospital is 24-72 hours. If there were problems with your labor or delivery, or if you have other medical problems, you might be in the hospital longer.  While you are in the hospital, you will receive help and instructions on how to care for yourself and your newborn during the postpartum period.  While you are in the hospital:  Be sure to tell your nurses if you have pain or discomfort, as well as where you feel the pain and what makes the pain worse.  If you had an incision made near your vagina (episiotomy) or if you had some tearing during delivery, the nurses may put ice packs on your episiotomy or tear. The ice packs may help to reduce the pain and swelling.  It is normal to have some bleeding after delivery.  For the first 1-3 days after delivery, the flow is red and the amount may be similar to a period.  It is common for the flow to start and stop.  In the first few days, you may pass some small clots. Let your nurses know if you begin to pass large clots or your flow increases.  Do not  flush blood clots down the toilet before having the nurse look at them.  During the next 3-10 days after delivery, your flow should become more watery and pink or brown-tinged in color.  Ten to fourteen days after delivery, your flow should be a small amount of yellowish-white discharge.  The amount of your flow will decrease over the first few weeks after delivery. Your flow may stop in 6-8 weeks. Most women have had their flow stop by 12 weeks after delivery.  You should change your sanitary pads frequently.  Wash your hands thoroughly with soap and water for at least 20 seconds after changing pads, using the toilet, or before holding or feeding your newborn.  You should feel like you need to empty your bladder within the first 6-8 hours after delivery.  In case you become weak,  lightheaded, or faint, call your nurse before you get out of bed for the first time and before you take a shower for the first time.  Within the first few days after delivery, your breasts may begin to feel tender and full. This is called engorgement. Breast tenderness usually goes away within 48-72 hours after engorgement occurs. You may also notice milk leaking from your breasts. If you are not breastfeeding, do not stimulate your breasts. Breast stimulation can make your breasts produce more milk.  Your hormones change after delivery. Sometimes the hormone changes can temporarily cause you to feel sad or tearful. These feelings should not last more than a few days. If these feelings last longer than that, you should talk to your caregiver.  If desired, talk to your caregiver about methods of family planning or contraception.  Document Released: 10/14/2007 Document Revised: 09/10/2012 Document Reviewed: 08/13/2012 [facility] Patient Information 2015 [place], [place]. This information is not intended to replace advice given to you by your health care provider. Make sure you discuss any questions you have with your health care provider.  Postpartum Depression  The postpartum period begins right after birth.  The blues or postpartum depression can be mild or severe. Additionally, postpartum depression can go away rather quickly, or it can be a long-term condition.  CAUSES Raised hormone levels and the rapid drop in those levels are thought  to be a main cause of postpartum depression and the baby blues. A number of hormones change during and after pregnancy. Estrogen and progesterone usually decrease right after the delivery of your baby. The levels of thyroid hormone and various cortisol steroids also rapidly drop. Other factors that play a role in these mood changes include major life events and genetics.  RISK FACTORS If you have any of the following risks for the baby blues or postpartum depression,  know what symptoms to watch out for during the postpartum period. Risk factors that may increase the likelihood of getting the baby blues or postpartum depression include:  Having a personal or family history of depression.   Having depression while being pregnant.   Having premenstrual mood issues or mood issues related to oral contraceptives.  Having a lot of life stress.   Having marital conflict.   Lacking a social support network.   Relinquishing for adoption  Having health problems, such as diabetes.  SIGNS AND SYMPTOMS Symptoms of mild blues include:  Brief changes in mood, such as going from extreme happiness to sadness.  Decreased concentration.   Difficulty sleeping.   Crying spells, tearfulness.   Irritability.   Anxiety.  Symptoms of postpartum depression typically begin within the first month after giving birth. These symptoms include:  Difficulty sleeping or excessive sleepiness.   Marked weight loss.   Agitation.   Feelings of worthlessness.   Lack of interest in activity or food.  Postpartum psychosis is a very serious condition and can be dangerous. Fortunately, it is rare. Displaying any of the following symptoms is cause for immediate medical attention. Symptoms of postpartum psychosis include:   Hallucinations and delusions.   Bizarre or disorganized behavior.   Confusion or disorientation.  DIAGNOSIS  A diagnosis is made by an evaluation of your symptoms. There are no medical or lab tests that lead to a diagnosis, but there are various questionnaires that a health care provider may use to identify those with the baby blues, postpartum depression, or psychosis. Often, a screening tool called the [place] Postnatal Depression Scale is used to diagnose depression in the postpartum period.  TREATMENT The blues usually go away on its own in 1-2 weeks. Social support is often all that is needed. You will be encouraged to get  adequate sleep and rest. Occasionally, you may be given medicines to help you sleep.  Postpartum depression requires treatment because it can last several months or longer if it is not treated. Treatment may include individual or group therapy, medicine, or both to address any social, physiological, and psychological factors that may play a role in the depression. Regular exercise, a healthy diet, rest, and social support may also be strongly recommended.  Postpartum psychosis is more serious and needs treatment right away. Hospitalization is often needed. HOME CARE INSTRUCTIONS  Get as much rest as you can.    Exercise regularly. Some women find yoga and walking to be beneficial.   Eat a balanced and nourishing diet.   Do little things that you enjoy. Have a cup of tea, take a bubble bath, read your favorite magazine, or listen to your favorite music.  Avoid alcohol.   Ask for help with household chores, cooking, grocery shopping, or running errands as needed. Do not try to do everything.   Talk to people close to you about how you are feeling. Get support from your partner, family members, friends, or other new moms.  Try to stay positive in how  you think. Think about the things you are grateful for.   Do not spend a lot of time alone.   Only take over-the-counter or prescription medicine as directed by your health care provider.  Keep all your postpartum appointments.   Let your health care provider know if you have any concerns.  SEEK MEDICAL CARE IF: You are having a reaction to or problems with your medicine. SEEK IMMEDIATE MEDICAL CARE IF:  You have suicidal feelings.   You think you may harm someone else. MAKE SURE YOU:  Understand these instructions.  Will watch your condition.  Will get help right away if you are not doing well or get worse. Document Released: 09/20/2004 Document Revised: 12/22/2013 Document Reviewed: 09/28/2013 [facility] Patient  Information 2015 [place], [place]. This information is not intended to replace advice given to you by your health care provider. Make sure you discuss any questions you have with your health care provider.

## 2015-05-10 NOTE — Progress Notes (Signed)
CSW provided notary services for pt and her adoption case manager Kendrick Fries) at pt's bedside.

## 2015-05-10 NOTE — Progress Notes (Signed)
  Subjective: Called to room to evaluate FHRT. Pt w/ c/o pressure and slight pain in lower abdomen, but bearable. Can feel touch, but not uncomfortable. Pt's mother at bedside.  Objective: BP 113/65 mmHg  Pulse 88  Temp(Src) 99.2 F (37.3 C) (Oral)  Resp 20  Ht 5\' 2"  (1.575 m)  Wt 71.215 kg (157 lb)  BMI 28.71 kg/m2  SpO2 100%  LMP 06/07/2014      Today's Vitals   05/10/15 0626 05/10/15 0630 05/10/15 0631 05/10/15 0641  BP:  113/65    Pulse: 77 83 88   Temp:      TempSrc:      Resp:  20    Height:      Weight:      SpO2: 100%  100%   PainSc:    4    FHT: BL 140 w/ moderate variability, accels, lates from 06:10-06:18 to 60-90s w/ slow recovery - resolved w/ intrauterine resuscitative measures UC:   irregular, every 1-2 minutes SVE: 4/50/-2, cvx mid, vtx well applied    Pitocin at 16 mU/min AROM - small amount of bloody fluid IUPC placed w/o difficulty  Assessment:  IUP at 40.6 wks IOL - full term pregnancy Cat 2 tracing that has reverted to Cat 1 with position change, 02, decrease in Pitocin and increase in IVFs GBS neg  Plan: Change Pitocin bag and decrease Pitocin to 8 mU/min PCA dose prn Continue plan Expect progress and SVD   Monica Stephens CNM 05/10/2015, 6:45 AM

## 2015-05-10 NOTE — Progress Notes (Signed)
  Subjective: Feeling some discomfort with UCs in lower pelvis.  No urge to push.  Mother at bedside.  Objective: BP 122/77 mmHg  Pulse 84  Temp(Src) 98.6 F (37 C) (Oral)  Resp 20  Ht 5\' 2"  (1.575 m)  Wt 71.215 kg (157 lb)  BMI 28.71 kg/m2  SpO2 100%  LMP 06/07/2014 I/O last 3 completed shifts: In: -  Out: 525 [Urine:525]    FHT: Category 2--sporadic variable decels with UCs, moderate variability on external monitor.  Resolution with position change UC:   regular, every 3 minutes, but inadequate quality SVE:   5-6 cm, 60%, vtx, -1 IUPC in place FSE applied without difficulty MVUs currently 95  Pitocin at 8 mu/min--new bag hung at 0656, rate decreased from 16 to 8 mu/min at 0639.  Amnioinfusion initiated.  Assessment:  Induction for postdates Baby for adoption Anxiety Bipolar Hx thyroid cancer, s/p ablation, on replacement.   Plan: Continue current care Patient desires d/c home as soon as possible after delivery.  Will need to sign adoption papers 12 hours after delivery.  Donnel Saxon CNM 05/10/2015, 7:40 AM

## 2015-05-10 NOTE — Progress Notes (Signed)
CSW notified prior to patient's arrival to [facility] of her intentions to place the infant up for adoption.  Adoption agency is [employer], and the Development worker, international aid is [employer].  CSW has a copy of the release of information that allows hospital staff to speak with the adoption agency.    The patient has voiced strong desire to relinquish her parental rights and complete adoption papers this evening s/p 12 hours delivery.  She has stated that she wants to be discharged in order to return home with her children.  CSW has been in contact with nursing staff, house coverage to be contacted in order to notarize the paperwork.  Adoption worker is aware of need for hospital staff to receive a copy of the relinquishments of parental rights for infant's chart, and she stated that she will provide a copy to the RN once paperwork is completed.  Once adoption papers are signed and patient is discharged, the adoptive parents will be providing care to the infant (but infant will be in the custody of the adoption agency, who has provided consent for adoptive parents to care for the infant).  CSW will continue to be in contact with the adoption worker and adoptive parents on 5/11 to coordinate discharge for the infant.   CSW met with the patient in order to assist her process her thoughts and feelings and identify how she would like to be supported at this time.  [name] mood and affect appeared appropriate for the setting (tearful, limited range in affect).  She was receptive to reviewing the events that led to her placing the infant up for adoption, and she expressed feeling confident in her decision to sign the paperwork this evening.  She acknowledged that she does not have to sign the paperwork or leave the hospital if she does not feel ready, and she is aware of her right to change her mind.    The patient reported that she lives alone with her 3 other children (born in 2004, 2007, and  2010).  She processed the emotional difficulties that they have experienced since they have grown attached to the infant during the pregnancy. She reported that she is worried about how they will cope and respond to the adoption.  She expressed interest and appreciation for information on [facility].   The patient presents with an acute awareness of how this adoption may impact her mental health.  She reported long history of major depressive disorder, and shared that her medications have been adjusted during the pregnancy. She stated that she has a follow up appointment with her psychiatrist on Monday (May 16) in order to readjust her medications.  The patient recognized that it will be an emotionally difficult experience for her when she leaves the hospital, and acknowledged increased risk for her symptoms worsening in upcoming weeks.  She stated that she will soon be transferring her psychiatrist to a local provider in order to reduce travel time, and confirmed that her psychiatrist will provide continuity of care.    Overall, the patient appears to be coping appropriately and as anticipated as she prepares for the adoption and leaving the hospital.  CSW provided supportive listening as she discussed how she feels during this time.  The patient reported feeling confident in her decision since she believes it is "best" for herself and her family. She stated that she had a difficult separation and divorce from her ex-husband, and only recently was able to "get my  feet back on the ground" (employement, own housing) and shared belief that she was not in a position to have another infant .She stated that she feels that it will be best for this infant to be with the adoptive parents.   Patient denied additional questions, concerns, or needs at this time. She agreed to contact CSW if needs arise.   CSW will continue to follow.

## 2015-05-10 NOTE — Progress Notes (Addendum)
  Subjective: Received epidural at 00:38 AM. Has had several PCA doses. Left leg numb, right leg ok. Feels as though she cannot breathe and can't sleep.  Objective: BP 110/65 mmHg  Pulse 103  Temp(Src) 99.2 F (37.3 C) (Oral)  Resp 20  Ht 5\' 2"  (1.575 m)  Wt 71.215 kg (157 lb)  BMI 28.71 kg/m2  SpO2 98%  LMP 06/07/2014      Today's Vitals   05/10/15 0140 05/10/15 0200 05/10/15 0230 05/10/15 0300  BP:  118/74 118/71 110/65  Pulse:  86 86 103  Temp:    99.2 F (37.3 C)  TempSrc:    Oral  Resp:  20  20  Height:      Weight:      SpO2:      PainSc: Asleep Asleep Asleep 0-No pain   FHT: BL 140 w/ moderate variability, +accels, no decels UC:   irregular, every 2-3 minutes SVE: 3/50/-3 - loose 3 cm/firm-med/posterior; BOW not palpable, vtx not as ballotable, but still not well applied Ext: 1-2+ bilateral LE edema Pitocin at 16 mU/min  Assessment:  IUP at 40.6 wks IOL - full term pregnancy Anxiety GBS neg  Plan: Continue w/ plan Reiterated process of induction Await further descent Place foley cath  Farrel Gordon CNM 05/10/2015, 3:53 AM

## 2015-05-10 NOTE — Progress Notes (Signed)
I spent time with MGM, Gina, while she was away from the room.  I offered emotional and spiritual support to her as she coped with the many emotions she was experiencing as a grandmother and as a mother to Leatrice Jewels.   Barnett Applebaum reported to me that another of her daughters placed a baby up for adoption and that the family still keeps up with that baby.  This has been very emotional for all of them, but they reported having a lot of support from family, friends and church family.  I consulted with Lucita Ferrara, LCSW, as well about support for mom and adoptive parents.  Please page as needs arise.  [employer] Pager, [phone] 4:12 PM    05/10/15 1600  Clinical Encounter Type  Visited With Family  Visit Type Spiritual support  Referral From Nurse  Stress Factors  Family Stress Factors Loss of control

## 2015-05-10 NOTE — Anesthesia Procedure Notes (Addendum)
Epidural Patient location during procedure: OB Start time: 05/10/2015 12:38 AM  Staffing Anesthesiologist: Josephine Igo Performed by: anesthesiologist   Preanesthetic Checklist Completed: patient identified, site marked, surgical consent, pre-op evaluation, timeout performed, IV checked, risks and benefits discussed and monitors and equipment checked  Epidural Patient position: sitting Prep: site prepped and draped and DuraPrep Patient monitoring: continuous pulse ox and blood pressure Approach: midline Location: L3-L4 Injection technique: LOR air  Needle:  Needle type: Tuohy  Needle gauge: 17 G Needle length: 9 cm and 9 Needle insertion depth: 4 cm Catheter type: closed end flexible Catheter size: 19 Gauge Catheter at skin depth: 9 cm Test dose: negative and Other  Assessment Events: blood not aspirated, injection not painful, no injection resistance, negative IV test and no paresthesia  Additional Notes Patient identified. Risks and benefits discussed including failed block, incomplete  Pain control, post dural puncture headache, nerve damage, paralysis, blood pressure Changes, nausea, vomiting, reactions to medications-both toxic and allergic and post Partum back pain. All questions were answered. Patient expressed understanding and wished to proceed. Sterile technique was used throughout procedure. Epidural site was Dressed with sterile barrier dressing. No paresthesias, signs of intravascular injection Or signs of intrathecal spread were encountered.  Patient was more comfortable after the epidural was dosed. Please see RN's note for documentation of vital signs and FHR which are stable.
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
## 2015-05-10 NOTE — Anesthesia Postprocedure Evaluation (Signed)
Anesthesia Post Note  Patient: Monica Stephens  Procedure(s) Performed: * No procedures listed *  Anesthesia type: Epidural  Patient location: Mother/Baby  Post pain: Pain level controlled  Post assessment: Post-op Vital signs reviewed  Last Vitals:  Filed Vitals:   05/10/15 1700  BP: 119/75  Pulse: 60  Temp: 36.9 C  Resp: 16    Post vital signs: Reviewed  Level of consciousness:alert  Complications: No apparent anesthesia complications

## 2015-05-10 NOTE — Anesthesia Preprocedure Evaluation (Addendum)
Anesthesia Evaluation  Patient identified by MRN, date of birth, ID band Patient awake    Reviewed: Allergy & Precautions, NPO status , Patient's Chart, lab work & pertinent test results  Airway Mallampati: III  TM Distance: >3 FB Neck ROM: Full    Dental no notable dental hx. (+) Teeth Intact   Pulmonary Current Smoker,  breath sounds clear to auscultation  Pulmonary exam normal       Cardiovascular negative cardio ROS Normal cardiovascular examRhythm:Regular Rate:Normal     Neuro/Psych PSYCHIATRIC DISORDERS Anxiety Depression Bipolar Disorder negative neurological ROS     GI/Hepatic Neg liver ROS, GERD-  ,  Endo/Other  Hypothyroidism Hx/o Thyroid Ca S/P RAI treatment  Renal/GU negative Renal ROS     Musculoskeletal negative musculoskeletal ROS (+)   Abdominal (+) - obese,   Peds  Hematology  (+) anemia ,   Anesthesia Other Findings   Reproductive/Obstetrics (+) Pregnancy                            Anesthesia Physical Anesthesia Plan  ASA: II  Anesthesia Plan: Epidural   Post-op Pain Management:    Induction:   Airway Management Planned: Natural Airway  Additional Equipment:   Intra-op Plan:   Post-operative Plan:   Informed Consent: I have reviewed the patients History and Physical, chart, labs and discussed the procedure including the risks, benefits and alternatives for the proposed anesthesia with the patient or authorized representative who has indicated his/her understanding and acceptance.     Plan Discussed with: Anesthesiologist  Anesthesia Plan Comments:         Anesthesia Quick Evaluation

## 2015-05-11 ENCOUNTER — Inpatient Hospital Stay (HOSPITAL_COMMUNITY): Admission: RE | Admit: 2015-05-11 | Payer: Medicaid Other | Source: Ambulatory Visit

## 2015-05-11 ENCOUNTER — Inpatient Hospital Stay (HOSPITAL_COMMUNITY): Payer: Medicaid Other

## 2015-05-12 LAB — RH IG WORKUP (INCLUDES ABO/RH)
ABO/RH(D): A NEG
FETAL SCREEN: NEGATIVE
GESTATIONAL AGE(WKS): 40.6
Unit division: 0

## 2015-05-14 ENCOUNTER — Encounter (HOSPITAL_COMMUNITY): Payer: Self-pay | Admitting: *Deleted

## 2015-05-14 ENCOUNTER — Inpatient Hospital Stay (HOSPITAL_COMMUNITY): Payer: Medicaid Other

## 2015-05-14 ENCOUNTER — Other Ambulatory Visit: Payer: Self-pay | Admitting: Obstetrics and Gynecology

## 2015-05-14 ENCOUNTER — Inpatient Hospital Stay (HOSPITAL_COMMUNITY)
Admission: AD | Admit: 2015-05-14 | Discharge: 2015-05-14 | Disposition: A | Discharge status: Home / Self Care | Payer: Medicaid Other | Source: Ambulatory Visit | Attending: Obstetrics and Gynecology | Admitting: Obstetrics and Gynecology

## 2015-05-14 DIAGNOSIS — F53 Puerperal psychosis: Secondary | ICD-10-CM | POA: Insufficient documentation

## 2015-05-14 DIAGNOSIS — R0602 Shortness of breath: Secondary | ICD-10-CM

## 2015-05-14 DIAGNOSIS — IMO0001 Reserved for inherently not codable concepts without codable children: Secondary | ICD-10-CM | POA: Diagnosis present

## 2015-05-14 DIAGNOSIS — O9089 Other complications of the puerperium, not elsewhere classified: Secondary | ICD-10-CM | POA: Insufficient documentation

## 2015-05-14 DIAGNOSIS — R030 Elevated blood-pressure reading, without diagnosis of hypertension: Secondary | ICD-10-CM

## 2015-05-14 DIAGNOSIS — I158 Other secondary hypertension: Secondary | ICD-10-CM | POA: Diagnosis not present

## 2015-05-14 LAB — CBC
HCT: 31.3 % — ABNORMAL LOW (ref 36.0–46.0)
Hemoglobin: 10.4 g/dL — ABNORMAL LOW (ref 12.0–15.0)
MCH: 28.6 pg (ref 26.0–34.0)
MCHC: 33.2 g/dL (ref 30.0–36.0)
MCV: 86 fL (ref 78.0–100.0)
Platelets: 200 10*3/uL (ref 150–400)
RBC: 3.64 MIL/uL — AB (ref 3.87–5.11)
RDW: 14.4 % (ref 11.5–15.5)
WBC: 10.9 10*3/uL — AB (ref 4.0–10.5)

## 2015-05-14 LAB — COMPREHENSIVE METABOLIC PANEL
ALT: 24 U/L (ref 14–54)
AST: 34 U/L (ref 15–41)
Albumin: 2.6 g/dL — ABNORMAL LOW (ref 3.5–5.0)
Alkaline Phosphatase: 133 U/L — ABNORMAL HIGH (ref 38–126)
Anion gap: 7 (ref 5–15)
BUN: 12 mg/dL (ref 6–20)
CO2: 24 mmol/L (ref 22–32)
Calcium: 8.2 mg/dL — ABNORMAL LOW (ref 8.9–10.3)
Chloride: 107 mmol/L (ref 101–111)
Creatinine, Ser: 0.76 mg/dL (ref 0.44–1.00)
GFR calc Af Amer: >60 mL/min (ref >60)
GFR calc non Af Amer: >60 mL/min (ref >60)
Glucose, Bld: 92 mg/dL (ref 65–99)
Potassium: 4.3 mmol/L (ref 3.5–5.1)
SODIUM: 138 mmol/L (ref 135–145)
Total Bilirubin: 0.2 mg/dL — ABNORMAL LOW (ref 0.3–1.2)
Total Protein: 6.2 g/dL — ABNORMAL LOW (ref 6.5–8.1)

## 2015-05-14 LAB — PROTEIN / CREATININE RATIO, URINE
Creatinine, Urine: 63 mg/dL
Protein Creatinine Ratio: 0.14 mg/mg{Cre} (ref 0.00–0.15)
Total Protein, Urine: 9 mg/dL

## 2015-05-14 LAB — URINE MICROSCOPIC-ADD ON

## 2015-05-14 LAB — URINALYSIS, ROUTINE W REFLEX MICROSCOPIC
Bilirubin Urine: NEGATIVE
GLUCOSE, UA: NEGATIVE mg/dL
KETONES UR: NEGATIVE mg/dL
Nitrite: NEGATIVE
PH: 6.5 (ref 5.0–8.0)
Protein, ur: NEGATIVE mg/dL
Specific Gravity, Urine: 1.01 (ref 1.005–1.030)
UROBILINOGEN UA: 0.2 mg/dL (ref 0.0–1.0)

## 2015-05-14 LAB — LACTATE DEHYDROGENASE: LDH: 191 U/L (ref 98–192)

## 2015-05-14 LAB — URIC ACID: Uric Acid, Serum: 5 mg/dL (ref 2.3–6.6)

## 2015-05-14 IMAGING — CR DG CHEST 2V
2 series · 2 of 2 positions shown · non-contrast
Comparison: [DATE]

CLINICAL DATA: Chest pain, shortness of breath and cough for 4
days, following vaginal delivery.

EXAM:
CHEST  2 VIEW

[chest pa]
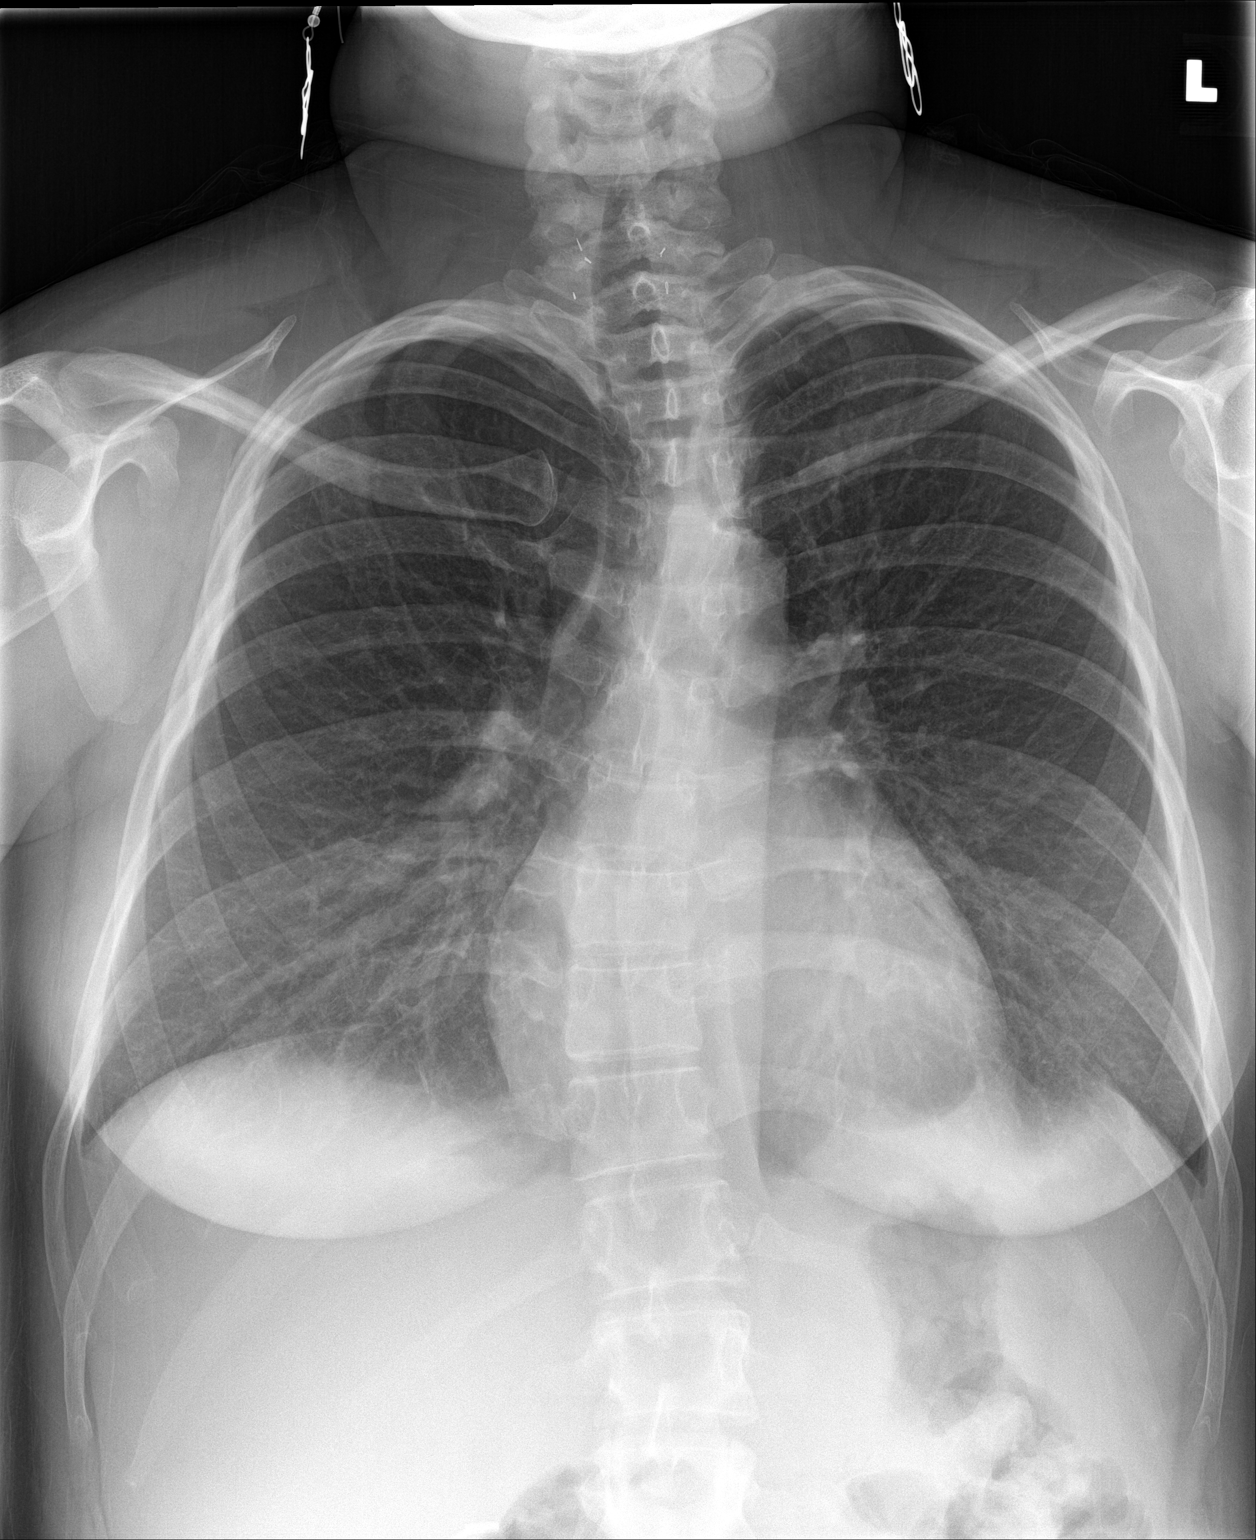

[chest lat]
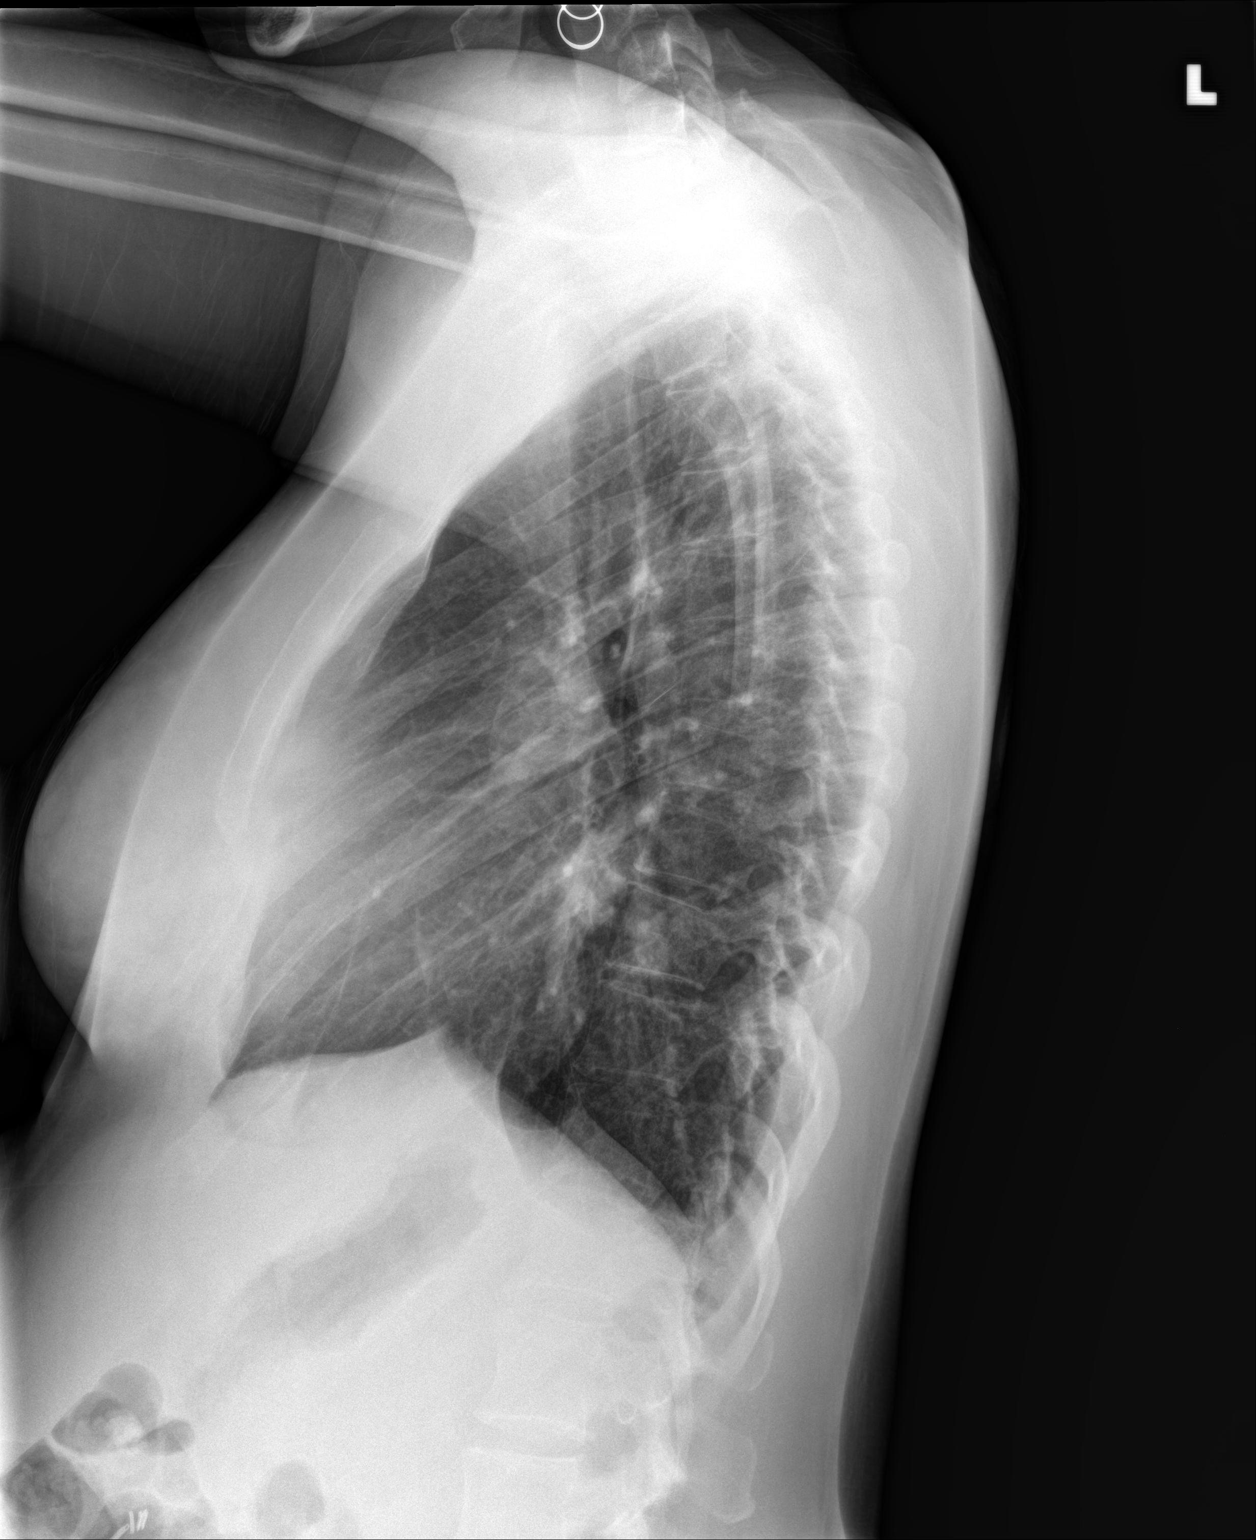

[2 of 2 positions shown; findings below may reference images not displayed]

FINDINGS: The cardiomediastinal silhouette is unremarkable.

There is no evidence of focal airspace disease, pulmonary edema,
suspicious pulmonary nodule/mass, pleural effusion, or pneumothorax.

No acute bony abnormalities are identified.

A mild-moderate thoracic scoliosis again noted.
IMPRESSION: No active cardiopulmonary disease.

## 2015-05-14 MED ORDER — LABETALOL HCL 200 MG PO TABS: 200.0000 mg | ORAL_TABLET | Freq: Two times a day (BID) | ORAL | Status: DC

## 2015-05-14 MED ORDER — LABETALOL HCL 100 MG PO TABS
200.0000 mg | ORAL_TABLET | Freq: Once | ORAL | Status: AC
  Administered 2015-05-14: 200 mg via ORAL
  Filled 2015-05-14: qty 2

## 2015-05-14 NOTE — Discharge Instructions (Signed)
Hypertension During Pregnancy Hypertension, or high blood pressure, is when there is extra pressure inside your blood vessels that carry blood from the heart to the rest of your body (arteries). It can happen at any time in life, including pregnancy. Hypertension during pregnancy can cause problems. Very bad cases of hypertension during pregnancy can be life-threatening.  Different types of hypertension can occur during pregnancy. These include:  Chronic hypertension. This happens when a woman has hypertension before pregnancy and it continues during pregnancy.  Gestational hypertension. This is when hypertension develops during pregnancy.  Preeclampsia or toxemia of pregnancy. This is a very serious type of hypertension that develops only during pregnancy. It affects the whole body and can be very dangerous. Gestational hypertension and preeclampsia usually go away after delivery. Your blood pressure will likely stabilize within 6 weeks. Women who have hypertension during pregnancy have a greater chance of developing hypertension later in life or with future pregnancies. RISK FACTORS There are certain factors that make it more likely for you to develop hypertension during pregnancy. These include:  Having hypertension before pregnancy.  Having hypertension during a previous pregnancy.  Being overweight.  Being older than 40 years.  Being pregnant with more than one baby.  Having diabetes or kidney problems. SIGNS AND SYMPTOMS Chronic and gestational hypertension rarely cause symptoms. Preeclampsia has symptoms, which may include:  Increased protein in your urine. Your health care provider will check for this at every prenatal visit.  Swelling of your hands and face.  Rapid weight gain.  Headaches.  Visual changes.  Being bothered by light.  Abdominal pain, especially in the upper right area.  Chest pain.  Shortness of breath.  Increased reflexes.  Seizures. These  occur with a more severe form of preeclampsia, called eclampsia. DIAGNOSIS  You may be diagnosed with hypertension during a regular prenatal exam. At each prenatal visit, you may have:  Your blood pressure checked.  A urine test to check for protein in your urine. The type of hypertension you are diagnosed with depends on when you developed it. It also depends on your specific blood pressure reading.  Developing hypertension before 20 weeks of pregnancy is consistent with chronic hypertension.  Developing hypertension after 20 weeks of pregnancy is consistent with gestational hypertension.  Hypertension with increased urinary protein is diagnosed as preeclampsia.  Blood pressure measurements that stay above 161 systolic or 096 diastolic are a sign of severe preeclampsia. TREATMENT Treatment for hypertension during pregnancy varies. Treatment depends on the type of hypertension and how serious it is.  If you take medicine for chronic hypertension, you may need to switch medicines.  Medicines called ACE inhibitors should not be taken during pregnancy.  Low-dose aspirin may be suggested for women who have risk factors for preeclampsia.  If you have gestational hypertension, you may need to take a blood pressure medicine that is safe during pregnancy. Your health care provider will recommend the correct medicine.  If you have severe preeclampsia, you may need to be in the hospital. Health care providers will watch you and your baby very closely. You also may need to take medicine called magnesium sulfate to prevent seizures and lower blood pressure.  Your health care provider may recommend that you take one low-dose aspirin (81 mg) each day to help prevent high blood pressure during your pregnancy if you are at risk for preeclampsia. You may be at risk for preeclampsia if:  You had preeclampsia or eclampsia during a previous pregnancy.  Your  baby did not grow as expected during a  previous pregnancy.  You experienced preterm birth with a previous pregnancy.  You experienced a separation of the placenta from the uterus (placental abruption) during a previous pregnancy.  You experienced the loss of your baby during a previous pregnancy.  You are pregnant with more than one baby.  You have other medical conditions, such as diabetes or an autoimmune disease. HOME CARE INSTRUCTIONS  Schedule and keep all of your regular prenatal care appointments. This is important.  Take medicines only as directed by your health care provider. Tell your health care provider about all medicines you take.  Eat as little salt as possible.  Get regular exercise.  Do not drink alcohol.  Do not use tobacco products.  Do not drink products with caffeine.  Lie on your left side when resting. SEEK IMMEDIATE MEDICAL CARE IF:  You have severe abdominal pain.  You have sudden swelling in your hands, ankles, or face.  You gain 4 pounds (1.8 kg) or more in 1 week.  You vomit repeatedly.  You have vaginal bleeding.  You have a headache.  You have blurred or double vision.  You have muscle twitching or spasms.  You have shortness of breath.  You have blue fingernails or lips.  You have blood in your urine. MAKE SURE YOU:  Understand these instructions.  Will watch your condition.  Will get help right away if you are not doing well or get worse. Document Released: 09/04/2011 Document Revised: 05/03/2014 Document Reviewed: 07/16/2013 [facility] Patient Information 2015 [place], [place]. This information is not intended to replace advice given to you by your health care provider. Make sure you discuss any questions you have with your health care provider.

## 2015-05-14 NOTE — MAU Note (Signed)
Pt states here for SOB, and elevated BP. For past 2 days has had SOB, checked bp at home and was elevated, then rechecked this am and was 150/90.
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
## 2015-08-25 ENCOUNTER — Encounter: Payer: Self-pay | Admitting: Family Medicine

## 2015-08-25 ENCOUNTER — Ambulatory Visit (INDEPENDENT_AMBULATORY_CARE_PROVIDER_SITE_OTHER): Payer: Medicaid Other | Admitting: Family Medicine

## 2015-08-25 VITALS — BP 105/68 | HR 81 | Temp 98.7°F | Resp 16 | Ht 62.0 in | Wt 124.0 lb

## 2015-08-25 DIAGNOSIS — R300 Dysuria: Secondary | ICD-10-CM | POA: Insufficient documentation

## 2015-08-25 LAB — POCT URINALYSIS DIPSTICK
Glucose, UA: NEGATIVE
Nitrite, UA: NEGATIVE
PH UA: 6
Urobilinogen, UA: 1

## 2015-08-25 MED ORDER — CEPHALEXIN 500 MG PO CAPS: 500.0000 mg | ORAL_CAPSULE | Freq: Four times a day (QID) | ORAL | Status: DC

## 2015-08-25 NOTE — Patient Instructions (Signed)
[facility] [place] (line is free 24 hour hot line): [phone] [place] [facility]: (walk in crisis)[phone] or [phone]  Both places have psychiatrist as well that you can see on a regular basis.  If immediate need for psychiatric need go to [facility] ED ([facility]/Psych available through them)

## 2015-08-25 NOTE — Progress Notes (Signed)
Pre visit review using our clinic review tool, if applicable. No additional management support is needed unless otherwise documented below in the visit note. 
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
## 2015-08-26 ENCOUNTER — Encounter: Payer: Self-pay | Admitting: Family Medicine

## 2015-08-26 ENCOUNTER — Ambulatory Visit (INDEPENDENT_AMBULATORY_CARE_PROVIDER_SITE_OTHER): Payer: Medicaid Other | Admitting: Family Medicine

## 2015-08-26 ENCOUNTER — Ambulatory Visit: Payer: Medicaid Other | Admitting: Family Medicine

## 2015-08-26 VITALS — BP 119/80 | HR 91 | Temp 98.6°F | Resp 18 | Wt 123.8 lb

## 2015-08-26 DIAGNOSIS — Z62810 Personal history of physical and sexual abuse in childhood: Secondary | ICD-10-CM | POA: Diagnosis not present

## 2015-08-26 DIAGNOSIS — F411 Generalized anxiety disorder: Secondary | ICD-10-CM

## 2015-08-26 DIAGNOSIS — F1910 Other psychoactive substance abuse, uncomplicated: Secondary | ICD-10-CM | POA: Diagnosis not present

## 2015-08-26 DIAGNOSIS — F331 Major depressive disorder, recurrent, moderate: Secondary | ICD-10-CM

## 2015-08-26 DIAGNOSIS — R300 Dysuria: Secondary | ICD-10-CM

## 2015-08-26 DIAGNOSIS — IMO0002 Reserved for concepts with insufficient information to code with codable children: Secondary | ICD-10-CM

## 2015-08-26 MED ORDER — FLUOXETINE HCL 40 MG PO CAPS: 40.0000 mg | ORAL_CAPSULE | Freq: Every day | ORAL | Status: DC

## 2015-08-26 MED ORDER — MIRTAZAPINE 15 MG PO TBDP: 15.0000 mg | ORAL_TABLET | Freq: Every day | ORAL | Status: DC

## 2015-08-26 MED ORDER — LORAZEPAM 1 MG PO TABS: 1.0000 mg | ORAL_TABLET | Freq: Two times a day (BID) | ORAL | Status: DC
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
## 2015-08-26 NOTE — Patient Instructions (Signed)
We will refer you to a psychiatrist to establish. I have refilled your medications today with a refill, to get you through until you are established. If you need anything until you are established please come see me, or refer to one of the 24 hour services we discussed on your prior visit.

## 2015-08-27 LAB — URINE CULTURE

## 2015-09-09 ENCOUNTER — Telehealth: Payer: Self-pay | Admitting: Family Medicine

## 2015-09-09 NOTE — Telephone Encounter (Signed)
I'm assuming this answer will be a no, but can you please verify?

## 2015-09-09 NOTE — Telephone Encounter (Signed)
Message is not very clear on specifics. Please call patient and pharmacy if necessary. Did she throw away the paper- script we gave her or did she throw away pills? See when her last refill was by pharmacy and check database if she states she threw away paper-script to make certain she did not fill.  - IF she states she threw away the paperscript and the database and her pharmacy support this, I may be able to fax a new script into her pharmacy.

## 2015-09-09 NOTE — Telephone Encounter (Signed)
We can fax in the script, fax in the one I wrote initially to her pharmacy. Thanks.

## 2015-09-09 NOTE — Telephone Encounter (Signed)
Patient states she threw away the RX.  Spoke to [facility] and her last RF was 08/23/15.   She has not dropped off any new RX's and nothing was detected on [place] controlled substance website.  Please advise.

## 2015-09-09 NOTE — Telephone Encounter (Signed)
Patient accidentally threw away her Rx for Ativan. She will need to fill Rx in a couple of weeks. Can she get another Rx?

## 2015-09-12 NOTE — Telephone Encounter (Signed)
Rx called into [facility].

## 2015-11-15 ENCOUNTER — Telehealth: Payer: Self-pay | Admitting: *Deleted

## 2015-11-15 ENCOUNTER — Other Ambulatory Visit: Payer: Self-pay | Admitting: Family Medicine

## 2015-11-15 MED ORDER — LORAZEPAM 1 MG PO TABS: 1.0000 mg | ORAL_TABLET | Freq: Two times a day (BID) | ORAL | Status: DC

## 2015-11-15 MED ORDER — FLUOXETINE HCL 40 MG PO CAPS: 40.0000 mg | ORAL_CAPSULE | Freq: Every day | ORAL | Status: DC

## 2015-11-15 MED ORDER — MIRTAZAPINE 15 MG PO TBDP: 15.0000 mg | ORAL_TABLET | Freq: Every day | ORAL | Status: DC

## 2015-11-15 NOTE — Telephone Encounter (Signed)
I have called in a 1 month refill for ativan, Prozac and Remeron, pt needs to follow with psych for any further refills. There are locations that will take those without insurance or financial difficulties such as [facility] the [place], [place] [facility] and [place]. [facility]  Has walk-in appointments, both have 24 hour emergency lines. I know this is not what she wants to do, but may be what she has to do just until her insurance is cleared and then she has more choices.  [facility]: [place]: [phone]  [facility]: [place] [facility] for emergency.

## 2015-11-15 NOTE — Telephone Encounter (Signed)
Patient called and states due to Insurance reasons she had to cancel her appt at the [facility] (counciling) and is now running out of her Lorazepam . She wants to know if you can refill this 1 time until she gets her Insurance situation fixed and she can reschedule her appt . Please Advise

## 2015-11-15 NOTE — Telephone Encounter (Signed)
Left message on patient voice mail with information. 

## 2016-04-08 ENCOUNTER — Emergency Department (HOSPITAL_BASED_OUTPATIENT_CLINIC_OR_DEPARTMENT_OTHER)
Admission: EM | Admit: 2016-04-08 | Discharge: 2016-04-08 | Disposition: A | Discharge status: Home / Self Care | Payer: BC Managed Care – PPO | Attending: Emergency Medicine | Admitting: Emergency Medicine

## 2016-04-08 ENCOUNTER — Encounter (HOSPITAL_BASED_OUTPATIENT_CLINIC_OR_DEPARTMENT_OTHER): Payer: Self-pay | Admitting: *Deleted

## 2016-04-08 DIAGNOSIS — R112 Nausea with vomiting, unspecified: Secondary | ICD-10-CM | POA: Diagnosis not present

## 2016-04-08 DIAGNOSIS — E890 Postprocedural hypothyroidism: Secondary | ICD-10-CM | POA: Diagnosis not present

## 2016-04-08 DIAGNOSIS — F410 Panic disorder [episodic paroxysmal anxiety] without agoraphobia: Secondary | ICD-10-CM | POA: Diagnosis not present

## 2016-04-08 DIAGNOSIS — Z8742 Personal history of other diseases of the female genital tract: Secondary | ICD-10-CM | POA: Insufficient documentation

## 2016-04-08 DIAGNOSIS — Z79899 Other long term (current) drug therapy: Secondary | ICD-10-CM | POA: Insufficient documentation

## 2016-04-08 DIAGNOSIS — F17210 Nicotine dependence, cigarettes, uncomplicated: Secondary | ICD-10-CM | POA: Diagnosis not present

## 2016-04-08 DIAGNOSIS — F310 Bipolar disorder, current episode hypomanic: Secondary | ICD-10-CM | POA: Insufficient documentation

## 2016-04-08 DIAGNOSIS — Z3202 Encounter for pregnancy test, result negative: Secondary | ICD-10-CM | POA: Insufficient documentation

## 2016-04-08 DIAGNOSIS — Z792 Long term (current) use of antibiotics: Secondary | ICD-10-CM | POA: Diagnosis not present

## 2016-04-08 DIAGNOSIS — Z85850 Personal history of malignant neoplasm of thyroid: Secondary | ICD-10-CM | POA: Insufficient documentation

## 2016-04-08 DIAGNOSIS — R110 Nausea: Secondary | ICD-10-CM | POA: Diagnosis present

## 2016-04-08 LAB — COMPREHENSIVE METABOLIC PANEL
ALBUMIN: 4.5 g/dL (ref 3.5–5.0)
ALT: 15 U/L (ref 14–54)
AST: 17 U/L (ref 15–41)
Alkaline Phosphatase: 44 U/L (ref 38–126)
Anion gap: 7 (ref 5–15)
BUN: 16 mg/dL (ref 6–20)
CHLORIDE: 104 mmol/L (ref 101–111)
CO2: 27 mmol/L (ref 22–32)
Calcium: 9.4 mg/dL (ref 8.9–10.3)
Creatinine, Ser: 0.83 mg/dL (ref 0.44–1.00)
GFR calc Af Amer: >60 mL/min (ref >60)
GFR calc non Af Amer: >60 mL/min (ref >60)
GLUCOSE: 109 mg/dL — AB (ref 65–99)
Potassium: 4.1 mmol/L (ref 3.5–5.1)
SODIUM: 138 mmol/L (ref 135–145)
Total Bilirubin: 0.2 mg/dL — ABNORMAL LOW (ref 0.3–1.2)
Total Protein: 7.5 g/dL (ref 6.5–8.1)

## 2016-04-08 LAB — URINE MICROSCOPIC-ADD ON: RBC / HPF: NONE SEEN RBC/hpf (ref 0–5)

## 2016-04-08 LAB — CBC WITH DIFFERENTIAL/PLATELET
Basophils Absolute: 0 10*3/uL (ref 0.0–0.1)
Basophils Relative: 0 %
EOS ABS: 0.1 10*3/uL (ref 0.0–0.7)
EOS PCT: 1 %
HCT: 39.3 % (ref 36.0–46.0)
HEMOGLOBIN: 13.3 g/dL (ref 12.0–15.0)
Lymphocytes Relative: 20 %
Lymphs Abs: 1.3 10*3/uL (ref 0.7–4.0)
MCH: 28.8 pg (ref 26.0–34.0)
MCHC: 33.8 g/dL (ref 30.0–36.0)
MCV: 85.1 fL (ref 78.0–100.0)
Monocytes Absolute: 0.3 10*3/uL (ref 0.1–1.0)
Monocytes Relative: 4 %
NEUTROS ABS: 5.1 10*3/uL (ref 1.7–7.7)
NEUTROS PCT: 75 %
PLATELETS: 222 10*3/uL (ref 150–400)
RBC: 4.62 MIL/uL (ref 3.87–5.11)
RDW: 15.6 % — ABNORMAL HIGH (ref 11.5–15.5)
WBC: 6.8 10*3/uL (ref 4.0–10.5)

## 2016-04-08 LAB — LIPASE, BLOOD: Lipase: 29 U/L (ref 11–51)

## 2016-04-08 LAB — URINALYSIS, ROUTINE W REFLEX MICROSCOPIC
Bilirubin Urine: NEGATIVE
Glucose, UA: NEGATIVE mg/dL
Hgb urine dipstick: NEGATIVE
Ketones, ur: 15 mg/dL — AB
LEUKOCYTES UA: NEGATIVE
Nitrite: NEGATIVE
PROTEIN: 30 mg/dL — AB
Specific Gravity, Urine: 1.019 (ref 1.005–1.030)
pH: 8.5 — ABNORMAL HIGH (ref 5.0–8.0)

## 2016-04-08 LAB — PREGNANCY, URINE: PREG TEST UR: NEGATIVE

## 2016-04-08 MED ORDER — DIPHENHYDRAMINE HCL 50 MG/ML IJ SOLN
12.5000 mg | Freq: Once | INTRAMUSCULAR | Status: AC
  Administered 2016-04-08: 12.5 mg via INTRAVENOUS
  Filled 2016-04-08: qty 1

## 2016-04-08 MED ORDER — SODIUM CHLORIDE 0.9 % IV BOLUS (SEPSIS)
1000.0000 mL | Freq: Once | INTRAVENOUS | Status: AC
Start: 2016-04-08 — End: 2016-04-08
  Administered 2016-04-08 (×2): 1000 mL via INTRAVENOUS

## 2016-04-08 MED ORDER — LORAZEPAM 2 MG/ML IJ SOLN
0.5000 mg | Freq: Once | INTRAMUSCULAR | Status: AC
  Administered 2016-04-08: 0.5 mg via INTRAVENOUS
  Filled 2016-04-08: qty 1

## 2016-04-08 MED ORDER — SODIUM CHLORIDE 0.9 % IV BOLUS (SEPSIS): 1000.0000 mL | Freq: Once | INTRAVENOUS | Status: DC

## 2016-04-08 MED ORDER — ONDANSETRON HCL 4 MG PO TABS: 4.0000 mg | ORAL_TABLET | Freq: Four times a day (QID) | ORAL | Status: DC

## 2016-04-08 MED ORDER — ONDANSETRON HCL 4 MG/2ML IJ SOLN
4.0000 mg | Freq: Once | INTRAMUSCULAR | Status: AC
  Administered 2016-04-08: 4 mg via INTRAVENOUS
  Filled 2016-04-08: qty 2

## 2016-04-08 MED ORDER — PROCHLORPERAZINE EDISYLATE 5 MG/ML IJ SOLN
10.0000 mg | Freq: Once | INTRAMUSCULAR | Status: AC
  Administered 2016-04-08: 10 mg via INTRAVENOUS
  Filled 2016-04-08: qty 2

## 2016-04-08 NOTE — ED Notes (Signed)
Emotional support provided, secondary to pt being very anxious, medicated per EDP orders
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
## 2016-04-08 NOTE — ED Notes (Signed)
Safety measures in place, initiated secondary to medication given

## 2016-04-08 NOTE — ED Notes (Signed)
MD at bedside. 

## 2016-04-08 NOTE — ED Notes (Signed)
IVF of 1liter NS fluid bolus initiated.

## 2016-04-08 NOTE — ED Notes (Signed)
Pt cont to c/o nausea, very restless on stretcher, very anxious, crying out, maniac type speech now noted. Spoke with EDP, new orders rec and implemented
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
## 2016-04-08 NOTE — Discharge Instructions (Signed)

## 2016-04-08 NOTE — ED Notes (Signed)
Pt able to tolerate water with no vomiting; reports improvement with nausea medicine.

## 2016-04-08 NOTE — ED Notes (Signed)
Placed on cont POX monitoring with int NBP monitoring at q16min

## 2016-04-08 NOTE — ED Notes (Signed)
Presents with nausea with onset this am approx 0600hrs, states has had diarrhea for the past few days, but has resolved. Appetite good last PM

## 2018-05-26 ENCOUNTER — Emergency Department (HOSPITAL_COMMUNITY)
Admission: EM | Admit: 2018-05-26 | Discharge: 2018-05-26 | Disposition: A | Discharge status: Home / Self Care | Payer: Self-pay | Attending: Emergency Medicine | Admitting: Emergency Medicine

## 2018-05-26 ENCOUNTER — Encounter (HOSPITAL_COMMUNITY): Payer: Self-pay | Admitting: Emergency Medicine

## 2018-05-26 ENCOUNTER — Emergency Department (HOSPITAL_COMMUNITY): Payer: Self-pay

## 2018-05-26 DIAGNOSIS — W460XXA Contact with hypodermic needle, initial encounter: Secondary | ICD-10-CM | POA: Insufficient documentation

## 2018-05-26 DIAGNOSIS — Y999 Unspecified external cause status: Secondary | ICD-10-CM | POA: Insufficient documentation

## 2018-05-26 DIAGNOSIS — F17210 Nicotine dependence, cigarettes, uncomplicated: Secondary | ICD-10-CM | POA: Insufficient documentation

## 2018-05-26 DIAGNOSIS — T148XXA Other injury of unspecified body region, initial encounter: Secondary | ICD-10-CM

## 2018-05-26 DIAGNOSIS — S51822A Laceration with foreign body of left forearm, initial encounter: Secondary | ICD-10-CM | POA: Insufficient documentation

## 2018-05-26 DIAGNOSIS — Y929 Unspecified place or not applicable: Secondary | ICD-10-CM | POA: Insufficient documentation

## 2018-05-26 DIAGNOSIS — Z79899 Other long term (current) drug therapy: Secondary | ICD-10-CM | POA: Insufficient documentation

## 2018-05-26 DIAGNOSIS — E039 Hypothyroidism, unspecified: Secondary | ICD-10-CM | POA: Insufficient documentation

## 2018-05-26 DIAGNOSIS — Y9389 Activity, other specified: Secondary | ICD-10-CM | POA: Insufficient documentation

## 2018-05-26 IMAGING — DX DG ELBOW 2V*L*
2 series · 2 of 2 positions shown · non-contrast
Comparison: None.

CLINICAL DATA: Concern noted needle stuck in the antecubital fossa.

EXAM:
LEFT ELBOW - 2 VIEW

[x elbow ap left]
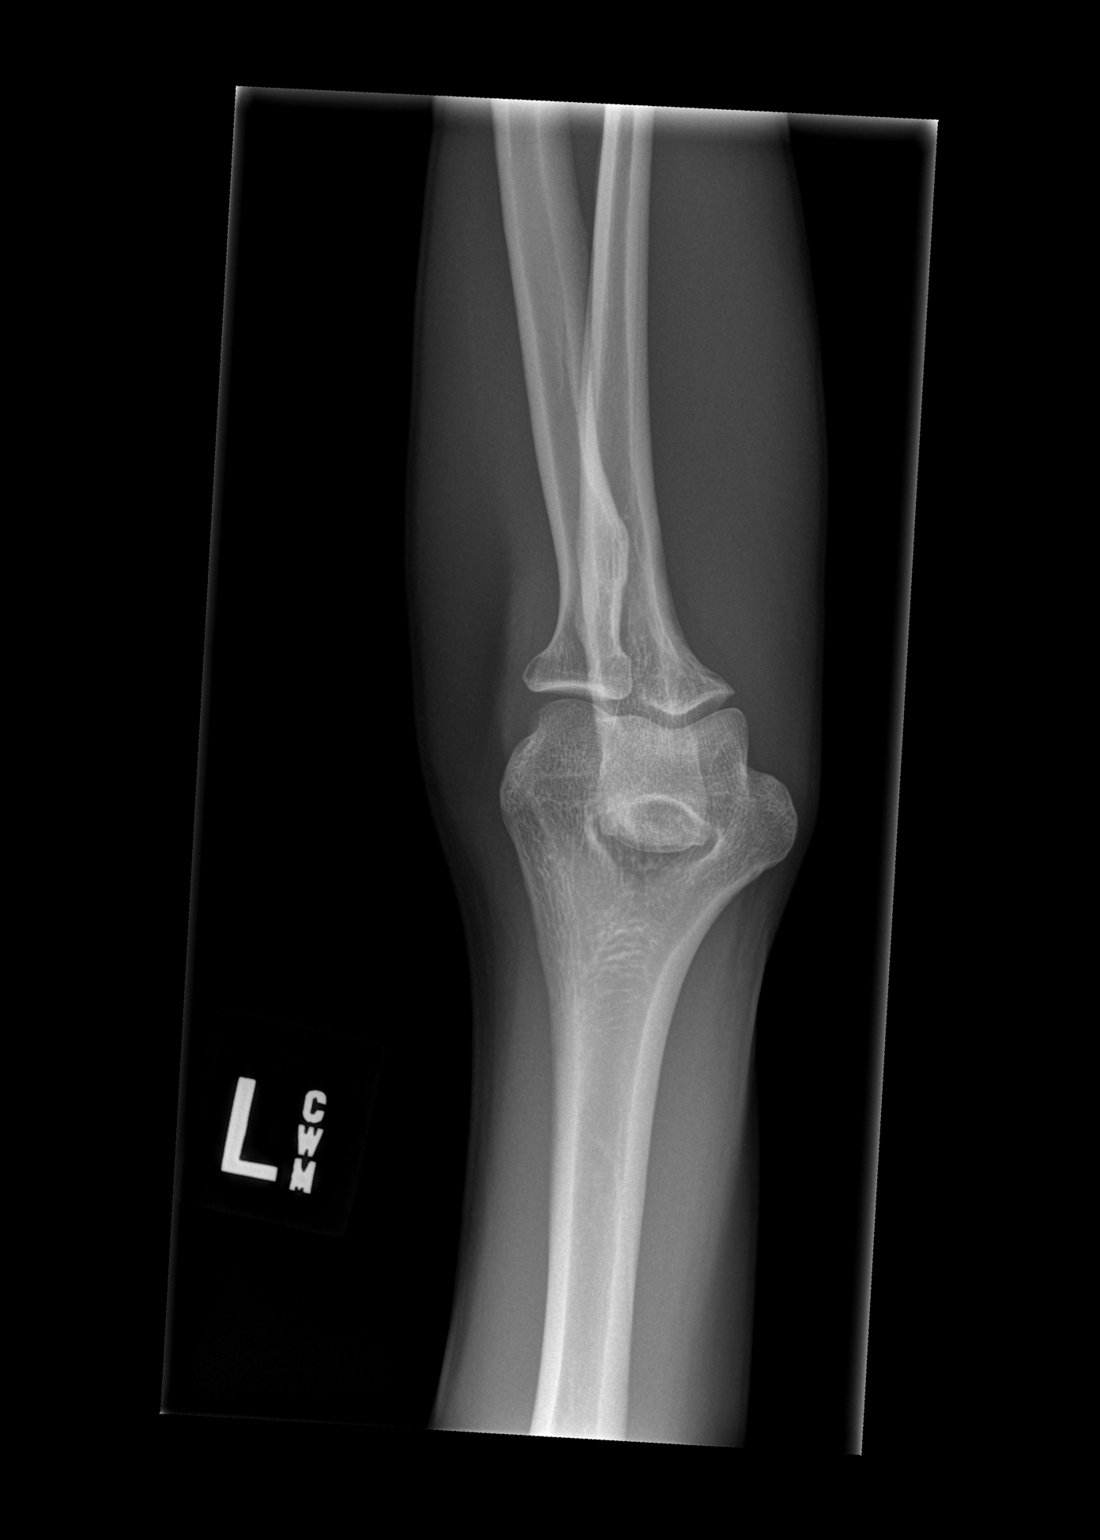

[x elbow lat left]
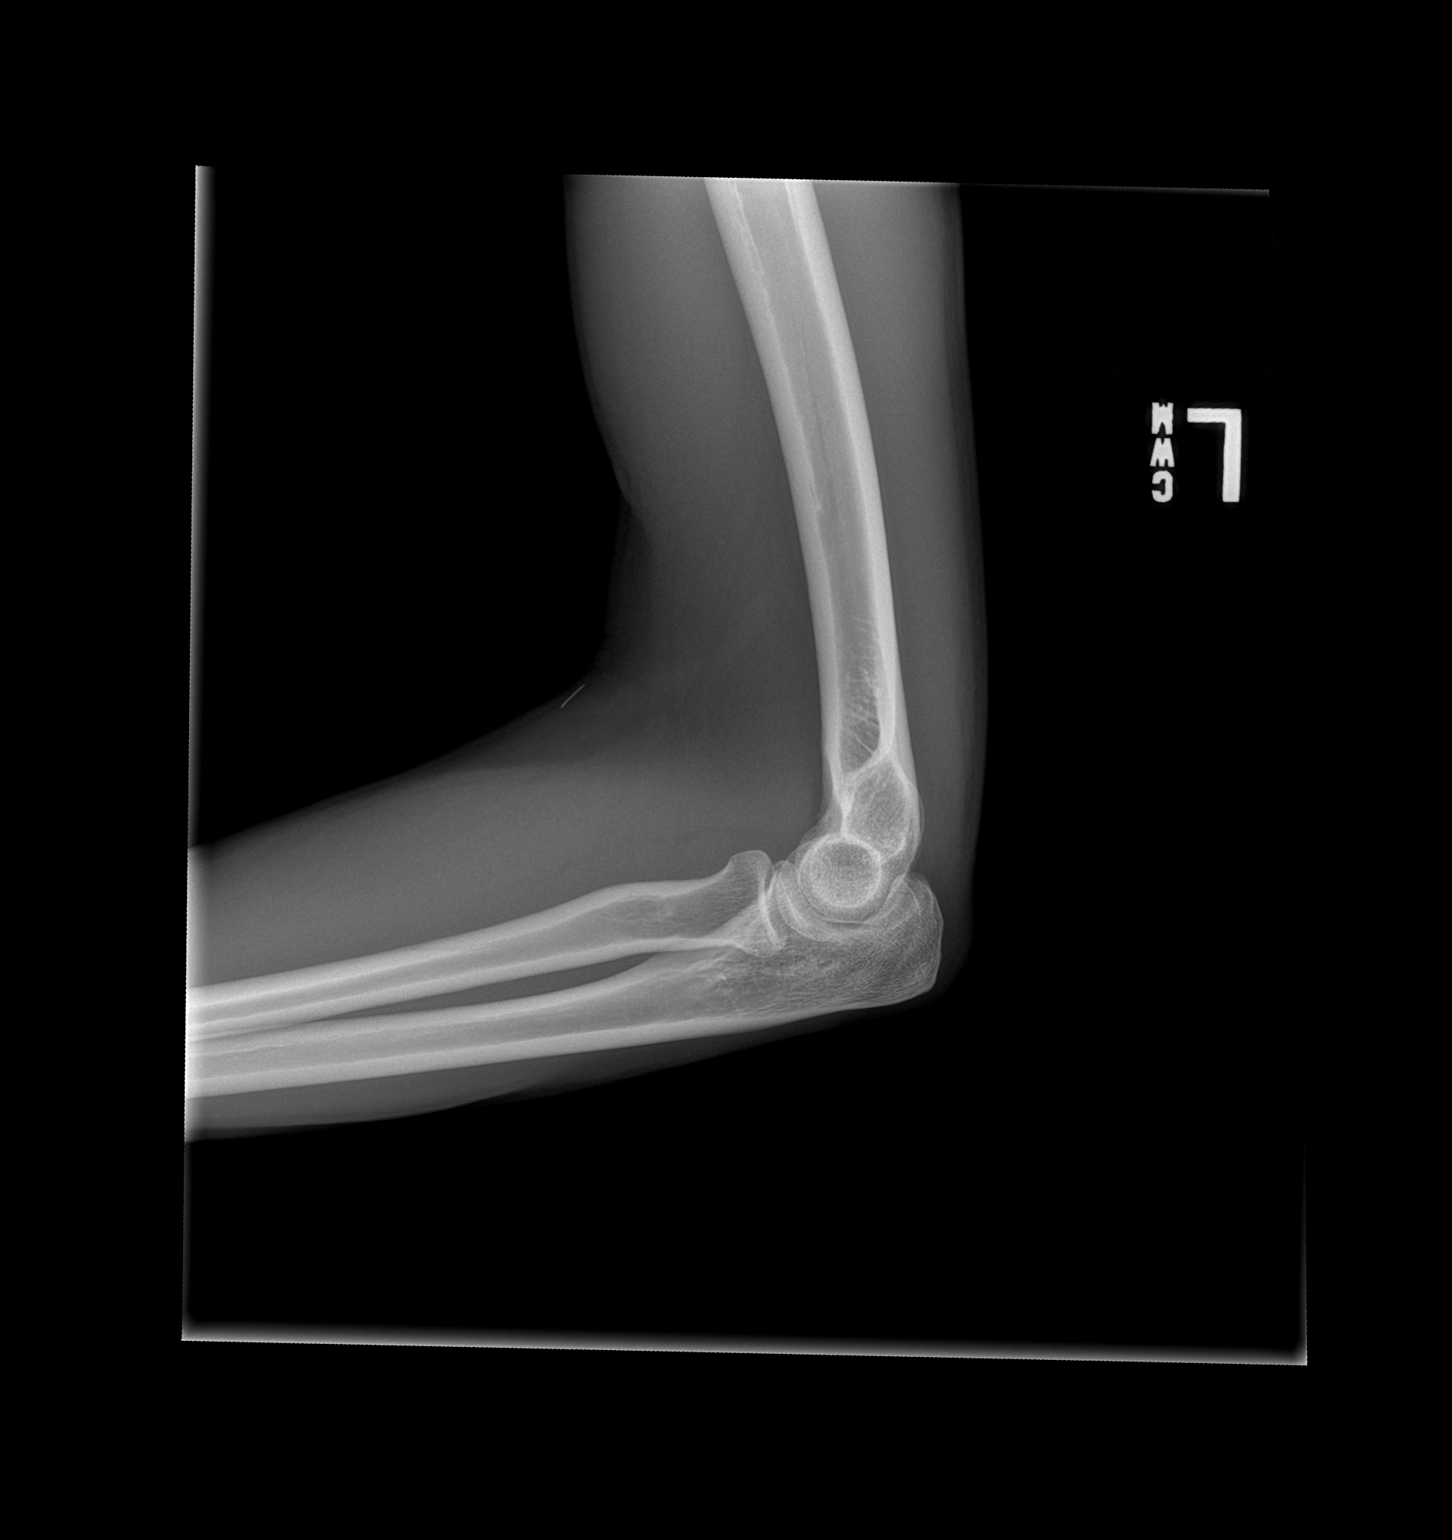

[2 of 2 positions shown; findings below may reference images not displayed]

FINDINGS: There is no evidence of fracture, dislocation, or joint effusion.
There is no evidence of arthropathy or other focal bone abnormality.
6.5 mm linear metallic foreign body in the subcutaneous soft tissues
of the antecubital fossa consistent with a broken metallic needle.
IMPRESSION: 6.5 mm linear metallic foreign body in the subcutaneous soft tissues
of the antecubital fossa consistent with a broken metallic needle.

## 2018-05-26 MED ORDER — LIDOCAINE-EPINEPHRINE 2 %-1:100000 IJ SOLN
20.0000 mL | Freq: Once | INTRAMUSCULAR | Status: DC
  Filled 2018-05-26: qty 20

## 2018-05-26 MED ORDER — LIDOCAINE-EPINEPHRINE (PF) 2 %-1:200000 IJ SOLN
20.0000 mL | Freq: Once | INTRAMUSCULAR | Status: AC
  Administered 2018-05-26: 20 mL
  Filled 2018-05-26: qty 20

## 2018-05-26 NOTE — Discharge Instructions (Addendum)
Please read attached information. If you experience any new or worsening signs or symptoms please return to the emergency room for evaluation. Please follow-up with your primary care provider or specialist as discussed.  °

## 2018-05-26 NOTE — ED Triage Notes (Signed)
Pt states she has been clean for two years and today she injected in her left [facility] with heroine. When she pulled the needle out, she did not see the tip of the needle and thinks it is left in her arm. Pt denies any pain.
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
## 2018-05-26 NOTE — ED Notes (Signed)
Pt went to car, pt states "she will be right back."

## 2019-06-01 DIAGNOSIS — I2690 Septic pulmonary embolism without acute cor pulmonale: Secondary | ICD-10-CM

## 2019-06-01 HISTORY — DX: Septic pulmonary embolism without acute cor pulmonale: I26.90

## 2019-06-06 ENCOUNTER — Encounter (HOSPITAL_COMMUNITY): Payer: Self-pay

## 2019-06-06 ENCOUNTER — Emergency Department (HOSPITAL_COMMUNITY): Payer: Self-pay

## 2019-06-06 ENCOUNTER — Emergency Department (HOSPITAL_COMMUNITY)
Admission: EM | Admit: 2019-06-06 | Discharge: 2019-06-06 | Disposition: A | Discharge status: Home / Self Care | Payer: Self-pay | Attending: Emergency Medicine | Admitting: Emergency Medicine

## 2019-06-06 DIAGNOSIS — T50901A Poisoning by unspecified drugs, medicaments and biological substances, accidental (unintentional), initial encounter: Secondary | ICD-10-CM | POA: Insufficient documentation

## 2019-06-06 LAB — CBG MONITORING, ED: Glucose-Capillary: 261 mg/dL — ABNORMAL HIGH (ref 70–99)

## 2019-06-06 IMAGING — DX PORTABLE CHEST - 1 VIEW
1 series · 1 of 1 positions shown · non-contrast
Comparison: None.

CLINICAL DATA: Chest pain.  Drug overdose.

EXAM:
PORTABLE CHEST 1 VIEW

[chest]
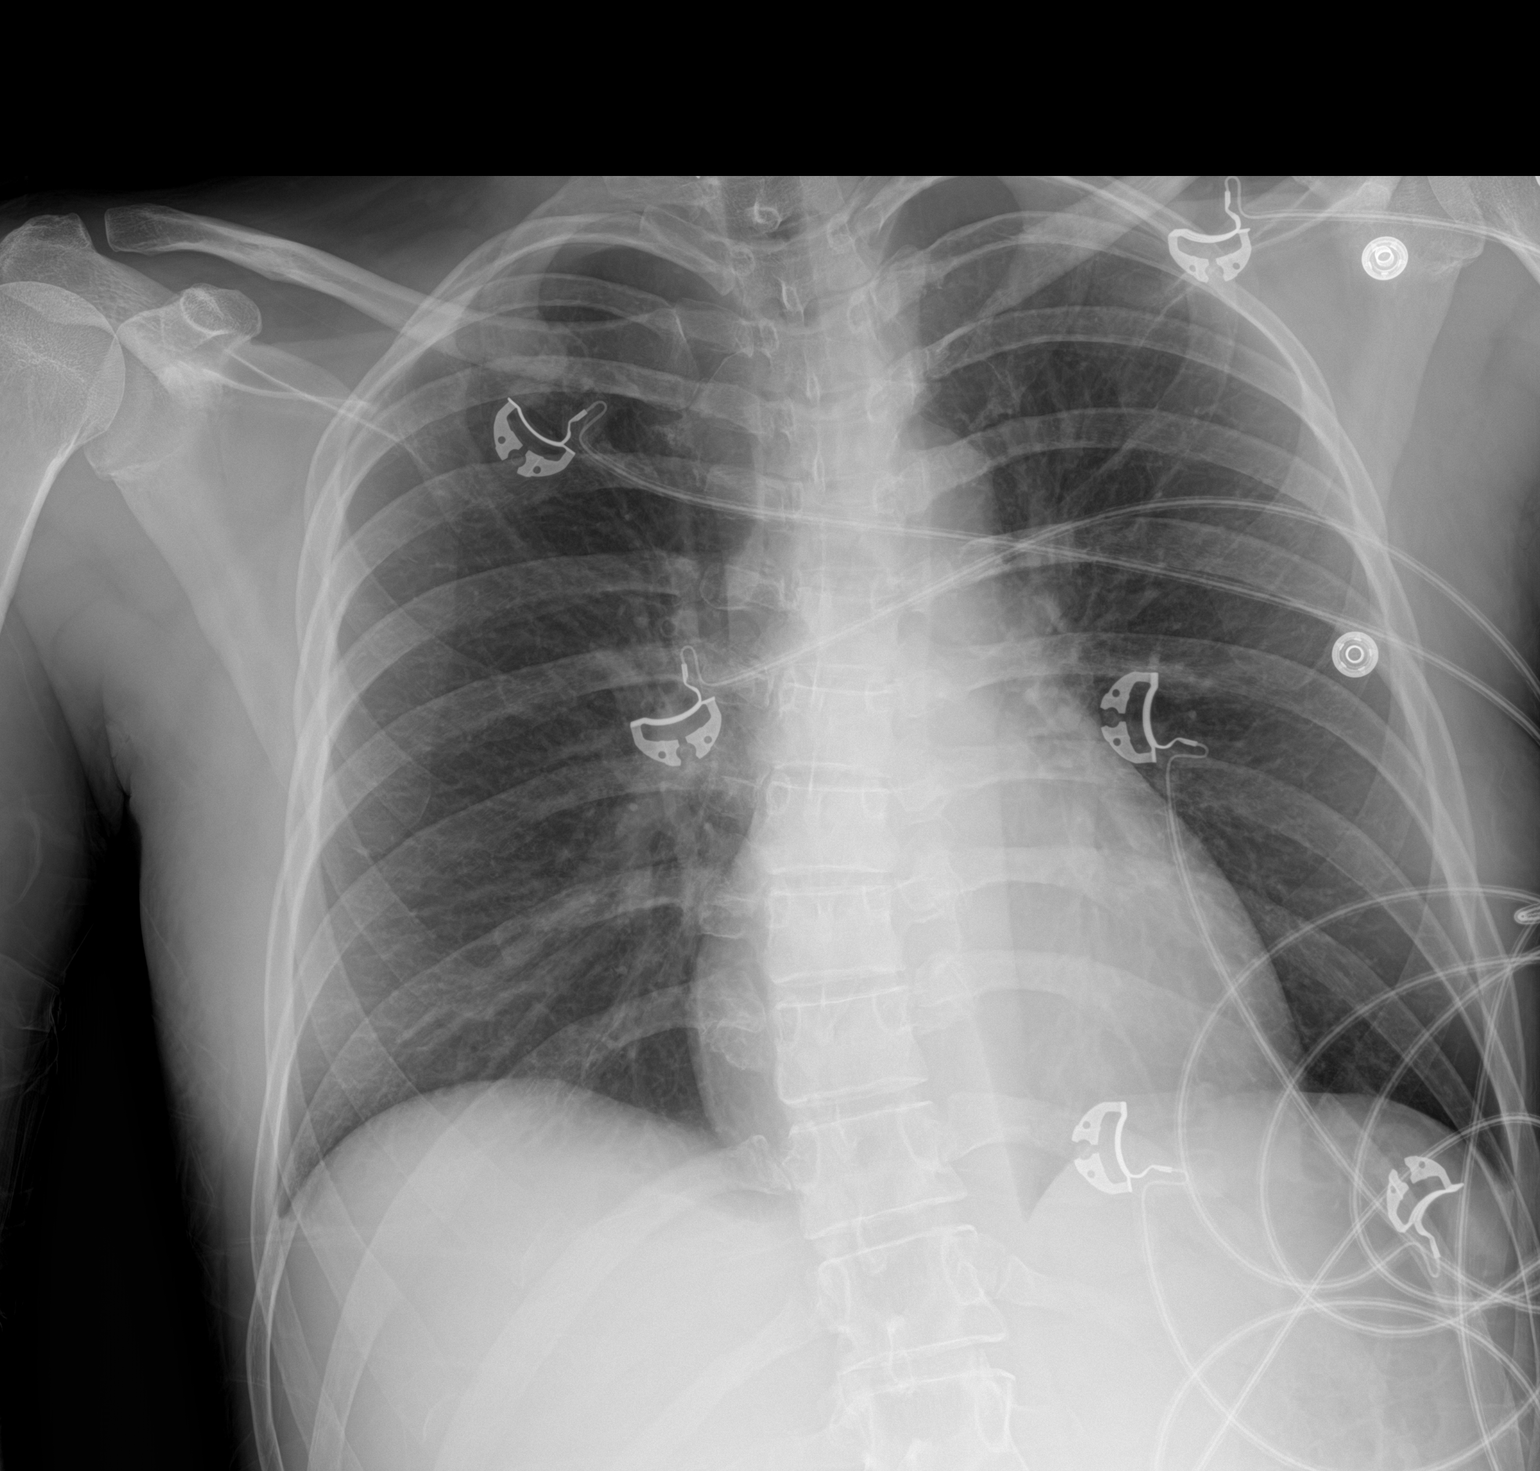

[1 of 1 positions shown; findings below may reference images not displayed]

FINDINGS: Lungs are clear. Heart size and pulmonary vascularity are normal. No
adenopathy. There is midthoracic dextroscoliosis. Surgical clips are
noted in the region of the thyroid. No pneumothorax or
pneumomediastinum.
IMPRESSION: No edema or consolidation. Heart size normal. No pneumothorax
evident.

## 2019-06-06 MED ORDER — NALOXONE HCL 2 MG/2ML IJ SOSY
PREFILLED_SYRINGE | INTRAMUSCULAR | Status: AC
  Filled 2019-06-06: qty 2

## 2019-06-06 MED ORDER — ONDANSETRON HCL 4 MG/2ML IJ SOLN
4.0000 mg | Freq: Once | INTRAMUSCULAR | Status: AC
  Administered 2019-06-06: 4 mg via INTRAVENOUS

## 2019-06-06 MED ORDER — NALOXONE HCL 4 MG/0.1ML NA LIQD
1.0000 | Freq: Once | NASAL | Status: AC
  Administered 2019-06-06: 18:00:00 1 via NASAL
  Filled 2019-06-06: qty 4

## 2019-06-06 MED ORDER — CLONIDINE HCL 0.1 MG PO TABS
0.1000 mg | ORAL_TABLET | Freq: Once | ORAL | Status: AC
  Administered 2019-06-06: 0.1 mg via ORAL
  Filled 2019-06-06: qty 1

## 2019-06-06 MED ORDER — ACETAMINOPHEN 325 MG PO TABS
650.0000 mg | ORAL_TABLET | Freq: Once | ORAL | Status: AC
  Administered 2019-06-06: 650 mg via ORAL
  Filled 2019-06-06: qty 2

## 2019-06-06 MED ORDER — NALOXONE HCL 2 MG/2ML IJ SOSY
PREFILLED_SYRINGE | INTRAMUSCULAR | Status: AC | PRN
  Administered 2019-06-06: 4 mg via INTRAVENOUS

## 2019-06-06 MED ORDER — ONDANSETRON HCL 4 MG/2ML IJ SOLN
INTRAMUSCULAR | Status: AC
  Administered 2019-06-06: 13:00:00 4 mg via INTRAVENOUS
  Filled 2019-06-06: qty 2

## 2019-06-06 MED ORDER — LORAZEPAM 2 MG/ML IJ SOLN
1.0000 mg | Freq: Once | INTRAMUSCULAR | Status: AC
  Administered 2019-06-06: 1 mg via INTRAVENOUS
  Filled 2019-06-06: qty 1

## 2019-06-06 NOTE — ED Notes (Signed)
Pt getting dressed now.  
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
## 2019-06-06 NOTE — ED Notes (Signed)
Pt now alert, talking

## 2019-06-06 NOTE — ED Notes (Signed)
Pt given d.c instructions and resources for substance abuse. Pt also given narcan nasal spray to take home. Pt given bus pass. Pt a.o upon d.c, wheeled out to lobby in wheelchair

## 2019-06-06 NOTE — ED Notes (Signed)
Pt took a few sips of coke.

## 2019-06-06 NOTE — Discharge Instructions (Signed)
We recommend you stop using drugs. However, we are sending you home with narcan for someone to use if you overdose again.   We also giving you resources for follow-up for substance abuse on an outpatient basis.  Return to the emergency department if any concerning signs or symptoms develop.

## 2019-06-06 NOTE — ED Triage Notes (Signed)
Pt arrives POV for overdose, needle stuck in thigh. Pt unresponsive

## 2019-06-08 ENCOUNTER — Encounter (HOSPITAL_COMMUNITY): Payer: Self-pay | Admitting: Emergency Medicine

## 2019-06-14 ENCOUNTER — Other Ambulatory Visit: Payer: Self-pay

## 2019-06-14 ENCOUNTER — Emergency Department (HOSPITAL_COMMUNITY): Payer: Self-pay

## 2019-06-14 ENCOUNTER — Inpatient Hospital Stay (HOSPITAL_COMMUNITY): Payer: Self-pay

## 2019-06-14 ENCOUNTER — Inpatient Hospital Stay (HOSPITAL_COMMUNITY)
Admission: EM | Admit: 2019-06-14 | Discharge: 2019-07-31 | DRG: 871 | Disposition: A | Discharge status: Home / Self Care | Payer: Self-pay | Attending: Internal Medicine | Admitting: Internal Medicine

## 2019-06-14 ENCOUNTER — Encounter (HOSPITAL_COMMUNITY): Payer: Self-pay | Admitting: Emergency Medicine

## 2019-06-14 DIAGNOSIS — E875 Hyperkalemia: Secondary | ICD-10-CM | POA: Diagnosis not present

## 2019-06-14 DIAGNOSIS — R0781 Pleurodynia: Secondary | ICD-10-CM

## 2019-06-14 DIAGNOSIS — F3130 Bipolar disorder, current episode depressed, mild or moderate severity, unspecified: Secondary | ICD-10-CM | POA: Diagnosis present

## 2019-06-14 DIAGNOSIS — J90 Pleural effusion, not elsewhere classified: Secondary | ICD-10-CM

## 2019-06-14 DIAGNOSIS — A4102 Sepsis due to Methicillin resistant Staphylococcus aureus: Principal | ICD-10-CM | POA: Diagnosis present

## 2019-06-14 DIAGNOSIS — Z79890 Hormone replacement therapy (postmenopausal): Secondary | ICD-10-CM

## 2019-06-14 DIAGNOSIS — K567 Ileus, unspecified: Secondary | ICD-10-CM | POA: Diagnosis not present

## 2019-06-14 DIAGNOSIS — Z8249 Family history of ischemic heart disease and other diseases of the circulatory system: Secondary | ICD-10-CM

## 2019-06-14 DIAGNOSIS — I471 Supraventricular tachycardia: Secondary | ICD-10-CM | POA: Diagnosis not present

## 2019-06-14 DIAGNOSIS — R0902 Hypoxemia: Secondary | ICD-10-CM

## 2019-06-14 DIAGNOSIS — J918 Pleural effusion in other conditions classified elsewhere: Secondary | ICD-10-CM | POA: Diagnosis not present

## 2019-06-14 DIAGNOSIS — B0229 Other postherpetic nervous system involvement: Secondary | ICD-10-CM | POA: Diagnosis not present

## 2019-06-14 DIAGNOSIS — F1110 Opioid abuse, uncomplicated: Secondary | ICD-10-CM

## 2019-06-14 DIAGNOSIS — I330 Acute and subacute infective endocarditis: Secondary | ICD-10-CM | POA: Diagnosis present

## 2019-06-14 DIAGNOSIS — I2690 Septic pulmonary embolism without acute cor pulmonale: Secondary | ICD-10-CM | POA: Diagnosis present

## 2019-06-14 DIAGNOSIS — B028 Zoster with other complications: Secondary | ICD-10-CM

## 2019-06-14 DIAGNOSIS — M71012 Abscess of bursa, left shoulder: Secondary | ICD-10-CM

## 2019-06-14 DIAGNOSIS — Z20828 Contact with and (suspected) exposure to other viral communicable diseases: Secondary | ICD-10-CM | POA: Diagnosis present

## 2019-06-14 DIAGNOSIS — Z820 Family history of epilepsy and other diseases of the nervous system: Secondary | ICD-10-CM

## 2019-06-14 DIAGNOSIS — T82898A Other specified complication of vascular prosthetic devices, implants and grafts, initial encounter: Secondary | ICD-10-CM

## 2019-06-14 DIAGNOSIS — I3139 Other pericardial effusion (noninflammatory): Secondary | ICD-10-CM

## 2019-06-14 DIAGNOSIS — B027 Disseminated zoster: Secondary | ICD-10-CM | POA: Diagnosis present

## 2019-06-14 DIAGNOSIS — F1990 Other psychoactive substance use, unspecified, uncomplicated: Secondary | ICD-10-CM

## 2019-06-14 DIAGNOSIS — G4710 Hypersomnia, unspecified: Secondary | ICD-10-CM | POA: Diagnosis not present

## 2019-06-14 DIAGNOSIS — F1120 Opioid dependence, uncomplicated: Secondary | ICD-10-CM | POA: Diagnosis present

## 2019-06-14 DIAGNOSIS — J189 Pneumonia, unspecified organism: Secondary | ICD-10-CM | POA: Diagnosis present

## 2019-06-14 DIAGNOSIS — J9690 Respiratory failure, unspecified, unspecified whether with hypoxia or hypercapnia: Secondary | ICD-10-CM

## 2019-06-14 DIAGNOSIS — E890 Postprocedural hypothyroidism: Secondary | ICD-10-CM | POA: Diagnosis present

## 2019-06-14 DIAGNOSIS — N179 Acute kidney failure, unspecified: Secondary | ICD-10-CM | POA: Diagnosis present

## 2019-06-14 DIAGNOSIS — J869 Pyothorax without fistula: Secondary | ICD-10-CM | POA: Diagnosis not present

## 2019-06-14 DIAGNOSIS — F410 Panic disorder [episodic paroxysmal anxiety] without agoraphobia: Secondary | ICD-10-CM | POA: Diagnosis not present

## 2019-06-14 DIAGNOSIS — S2220XA Unspecified fracture of sternum, initial encounter for closed fracture: Secondary | ICD-10-CM | POA: Diagnosis present

## 2019-06-14 DIAGNOSIS — Z818 Family history of other mental and behavioral disorders: Secondary | ICD-10-CM

## 2019-06-14 DIAGNOSIS — Z923 Personal history of irradiation: Secondary | ICD-10-CM

## 2019-06-14 DIAGNOSIS — R7881 Bacteremia: Secondary | ICD-10-CM

## 2019-06-14 DIAGNOSIS — I76 Septic arterial embolism: Secondary | ICD-10-CM | POA: Diagnosis present

## 2019-06-14 DIAGNOSIS — Z590 Homelessness: Secondary | ICD-10-CM

## 2019-06-14 DIAGNOSIS — T402X5A Adverse effect of other opioids, initial encounter: Secondary | ICD-10-CM | POA: Diagnosis not present

## 2019-06-14 DIAGNOSIS — L0291 Cutaneous abscess, unspecified: Secondary | ICD-10-CM

## 2019-06-14 DIAGNOSIS — L02412 Cutaneous abscess of left axilla: Secondary | ICD-10-CM | POA: Diagnosis not present

## 2019-06-14 DIAGNOSIS — G92 Toxic encephalopathy: Secondary | ICD-10-CM | POA: Diagnosis not present

## 2019-06-14 DIAGNOSIS — F411 Generalized anxiety disorder: Secondary | ICD-10-CM | POA: Diagnosis present

## 2019-06-14 DIAGNOSIS — R7989 Other specified abnormal findings of blood chemistry: Secondary | ICD-10-CM

## 2019-06-14 DIAGNOSIS — J9601 Acute respiratory failure with hypoxia: Secondary | ICD-10-CM | POA: Diagnosis not present

## 2019-06-14 DIAGNOSIS — E860 Dehydration: Secondary | ICD-10-CM | POA: Diagnosis present

## 2019-06-14 DIAGNOSIS — F17210 Nicotine dependence, cigarettes, uncomplicated: Secondary | ICD-10-CM | POA: Diagnosis present

## 2019-06-14 DIAGNOSIS — R0989 Other specified symptoms and signs involving the circulatory and respiratory systems: Secondary | ICD-10-CM

## 2019-06-14 DIAGNOSIS — R6520 Severe sepsis without septic shock: Secondary | ICD-10-CM | POA: Diagnosis present

## 2019-06-14 DIAGNOSIS — F1910 Other psychoactive substance abuse, uncomplicated: Secondary | ICD-10-CM

## 2019-06-14 DIAGNOSIS — F4310 Post-traumatic stress disorder, unspecified: Secondary | ICD-10-CM | POA: Diagnosis present

## 2019-06-14 DIAGNOSIS — N390 Urinary tract infection, site not specified: Secondary | ICD-10-CM | POA: Diagnosis not present

## 2019-06-14 DIAGNOSIS — G4700 Insomnia, unspecified: Secondary | ICD-10-CM | POA: Diagnosis not present

## 2019-06-14 DIAGNOSIS — K5903 Drug induced constipation: Secondary | ICD-10-CM | POA: Diagnosis not present

## 2019-06-14 DIAGNOSIS — T402X1A Poisoning by other opioids, accidental (unintentional), initial encounter: Secondary | ICD-10-CM | POA: Diagnosis present

## 2019-06-14 DIAGNOSIS — R740 Nonspecific elevation of levels of transaminase and lactic acid dehydrogenase [LDH]: Secondary | ICD-10-CM

## 2019-06-14 DIAGNOSIS — R238 Other skin changes: Secondary | ICD-10-CM

## 2019-06-14 DIAGNOSIS — I313 Pericardial effusion (noninflammatory): Secondary | ICD-10-CM | POA: Diagnosis present

## 2019-06-14 DIAGNOSIS — R21 Rash and other nonspecific skin eruption: Secondary | ICD-10-CM

## 2019-06-14 DIAGNOSIS — A419 Sepsis, unspecified organism: Secondary | ICD-10-CM

## 2019-06-14 DIAGNOSIS — D638 Anemia in other chronic diseases classified elsewhere: Secondary | ICD-10-CM | POA: Diagnosis present

## 2019-06-14 DIAGNOSIS — E872 Acidosis: Secondary | ICD-10-CM | POA: Diagnosis present

## 2019-06-14 DIAGNOSIS — R140 Abdominal distension (gaseous): Secondary | ICD-10-CM

## 2019-06-14 DIAGNOSIS — J9602 Acute respiratory failure with hypercapnia: Secondary | ICD-10-CM | POA: Diagnosis not present

## 2019-06-14 DIAGNOSIS — Z85850 Personal history of malignant neoplasm of thyroid: Secondary | ICD-10-CM

## 2019-06-14 DIAGNOSIS — E874 Mixed disorder of acid-base balance: Secondary | ICD-10-CM | POA: Diagnosis not present

## 2019-06-14 HISTORY — DX: Other psychoactive substance abuse, uncomplicated: F19.10

## 2019-06-14 HISTORY — DX: Methicillin resistant Staphylococcus aureus infection, unspecified site: A49.02

## 2019-06-14 LAB — URINALYSIS, ROUTINE W REFLEX MICROSCOPIC
Bilirubin Urine: NEGATIVE
Glucose, UA: NEGATIVE mg/dL
Ketones, ur: NEGATIVE mg/dL
Leukocytes,Ua: NEGATIVE
Nitrite: NEGATIVE
Protein, ur: 30 mg/dL — AB
Specific Gravity, Urine: 1.015 (ref 1.005–1.030)
pH: 5 (ref 5.0–8.0)

## 2019-06-14 LAB — COMPREHENSIVE METABOLIC PANEL
ALT: 29 U/L (ref 0–44)
AST: 59 U/L — ABNORMAL HIGH (ref 15–41)
Albumin: 2.2 g/dL — ABNORMAL LOW (ref 3.5–5.0)
Alkaline Phosphatase: 297 U/L — ABNORMAL HIGH (ref 38–126)
Anion gap: 17 — ABNORMAL HIGH (ref 5–15)
BUN: 65 mg/dL — ABNORMAL HIGH (ref 6–20)
CO2: 23 mmol/L (ref 22–32)
Calcium: 8.1 mg/dL — ABNORMAL LOW (ref 8.9–10.3)
Chloride: 94 mmol/L — ABNORMAL LOW (ref 98–111)
Creatinine, Ser: 2.84 mg/dL — ABNORMAL HIGH (ref 0.44–1.00)
GFR calc Af Amer: 24 mL/min — ABNORMAL LOW (ref >60)
GFR calc non Af Amer: 21 mL/min — ABNORMAL LOW (ref >60)
Glucose, Bld: 108 mg/dL — ABNORMAL HIGH (ref 70–99)
Potassium: 3.8 mmol/L (ref 3.5–5.1)
Sodium: 134 mmol/L — ABNORMAL LOW (ref 135–145)
Total Bilirubin: 1.1 mg/dL (ref 0.3–1.2)
Total Protein: 6.9 g/dL (ref 6.5–8.1)

## 2019-06-14 LAB — CBC WITH DIFFERENTIAL/PLATELET
Abs Immature Granulocytes: 0.34 10*3/uL — ABNORMAL HIGH (ref 0.00–0.07)
Basophils Absolute: 0 10*3/uL (ref 0.0–0.1)
Basophils Relative: 0 %
Eosinophils Absolute: 0 10*3/uL (ref 0.0–0.5)
Eosinophils Relative: 0 %
HCT: 34.6 % — ABNORMAL LOW (ref 36.0–46.0)
Hemoglobin: 11.9 g/dL — ABNORMAL LOW (ref 12.0–15.0)
Immature Granulocytes: 2 %
Lymphocytes Relative: 2 %
Lymphs Abs: 0.4 10*3/uL — ABNORMAL LOW (ref 0.7–4.0)
MCH: 29.2 pg (ref 26.0–34.0)
MCHC: 34.4 g/dL (ref 30.0–36.0)
MCV: 85 fL (ref 80.0–100.0)
Monocytes Absolute: 0.2 10*3/uL (ref 0.1–1.0)
Monocytes Relative: 1 %
Neutro Abs: 13.7 10*3/uL — ABNORMAL HIGH (ref 1.7–7.7)
Neutrophils Relative %: 95 %
Platelets: 269 10*3/uL (ref 150–400)
RBC: 4.07 MIL/uL (ref 3.87–5.11)
RDW: 15.3 % (ref 11.5–15.5)
WBC: 14.5 10*3/uL — ABNORMAL HIGH (ref 4.0–10.5)
nRBC: 0 % (ref 0.0–0.2)

## 2019-06-14 LAB — CBC
HCT: 29.3 % — ABNORMAL LOW (ref 36.0–46.0)
Hemoglobin: 9.8 g/dL — ABNORMAL LOW (ref 12.0–15.0)
MCH: 29.7 pg (ref 26.0–34.0)
MCHC: 33.4 g/dL (ref 30.0–36.0)
MCV: 88.8 fL (ref 80.0–100.0)
Platelets: 153 10*3/uL (ref 150–400)
RBC: 3.3 MIL/uL — ABNORMAL LOW (ref 3.87–5.11)
RDW: 15.9 % — ABNORMAL HIGH (ref 11.5–15.5)
WBC: 9.3 10*3/uL (ref 4.0–10.5)
nRBC: 0 % (ref 0.0–0.2)

## 2019-06-14 LAB — SARS CORONAVIRUS 2: SARS Coronavirus 2: NOT DETECTED

## 2019-06-14 LAB — TSH: TSH: 173.181 u[IU]/mL — ABNORMAL HIGH (ref 0.350–4.500)

## 2019-06-14 LAB — LACTIC ACID, PLASMA
Lactic Acid, Venous: 2.6 mmol/L (ref 0.5–1.9)
Lactic Acid, Venous: 1.8 mmol/L (ref 0.5–1.9)

## 2019-06-14 LAB — I-STAT BETA HCG BLOOD, ED (MC, WL, AP ONLY): I-stat hCG, quantitative: <5 m[IU]/mL (ref <5)

## 2019-06-14 LAB — PROTIME-INR
INR: 1.3 — ABNORMAL HIGH (ref 0.8–1.2)
Prothrombin Time: 15.7 seconds — ABNORMAL HIGH (ref 11.4–15.2)

## 2019-06-14 LAB — ABO/RH: ABO/RH(D): A NEG

## 2019-06-14 IMAGING — US US RENAL
1 series · 14 of 25 positions shown · non-contrast
Comparison: None.

CLINICAL DATA: 37 y/o  F; increased creatinine.

EXAM:
RENAL / URINARY TRACT ULTRASOUND COMPLETE

[Series 1: us renal · 14 of 37 slices shown]
[im 1/37]
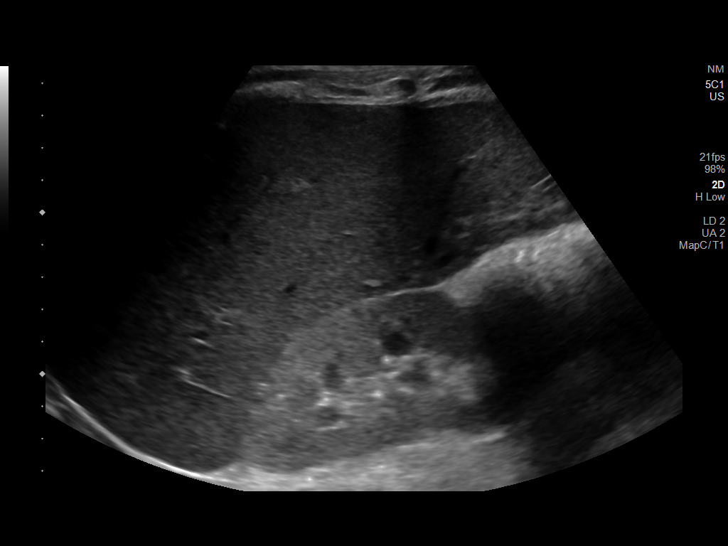
[im 4/37]
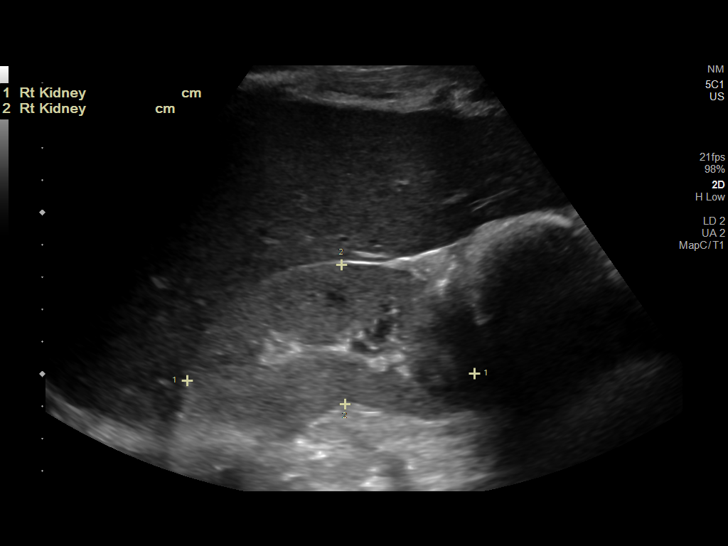
[im 7/37]
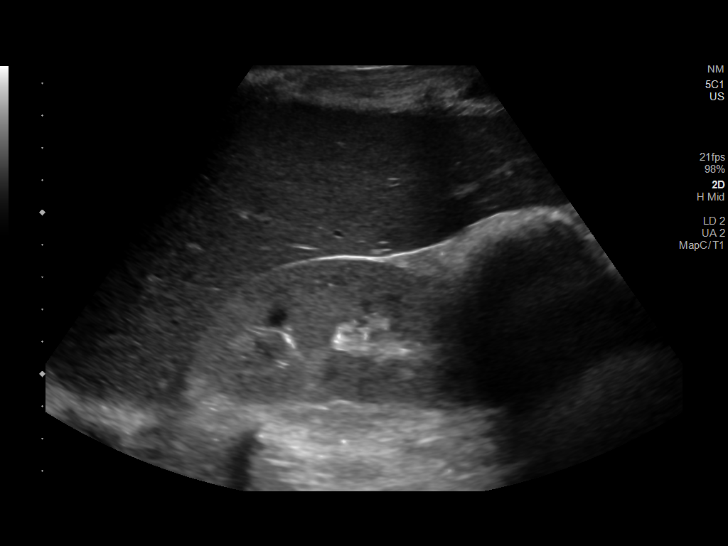
[im 10/37]
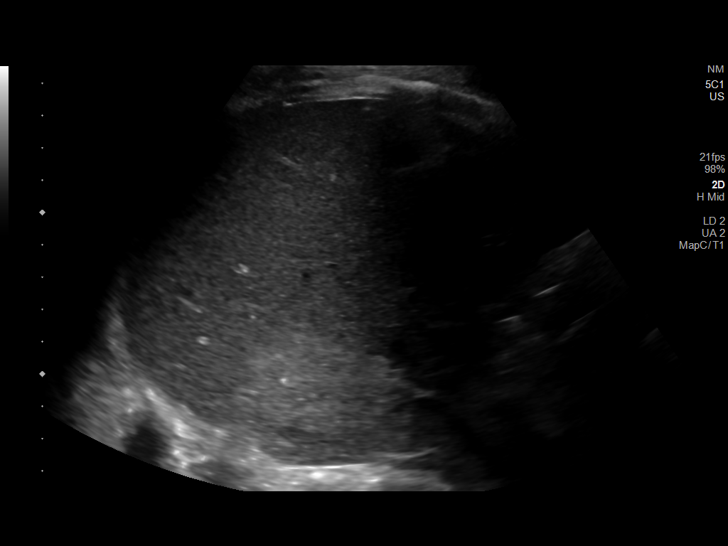
[im 13/37]
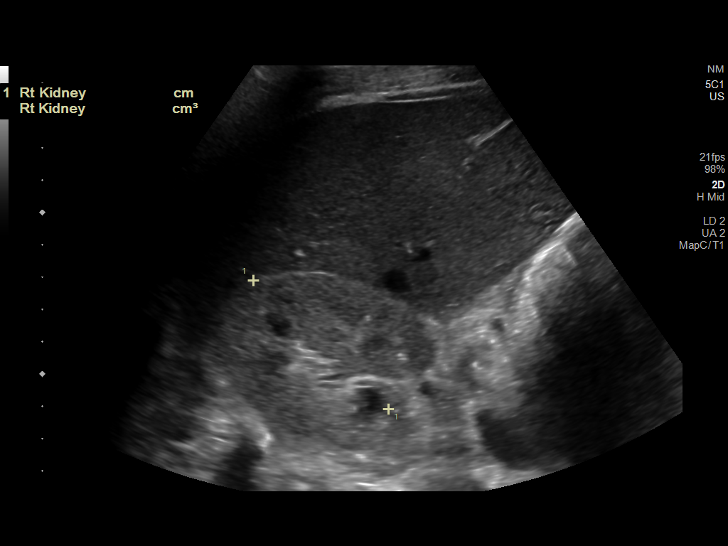
[im 14/37]
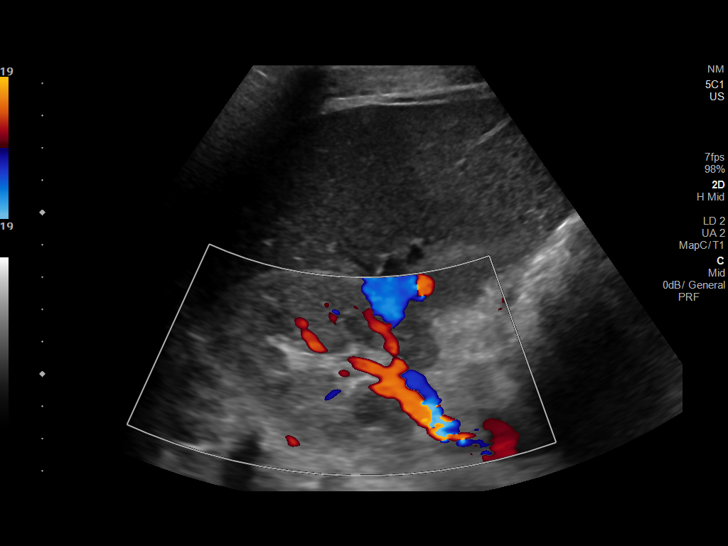
[im 17/37]
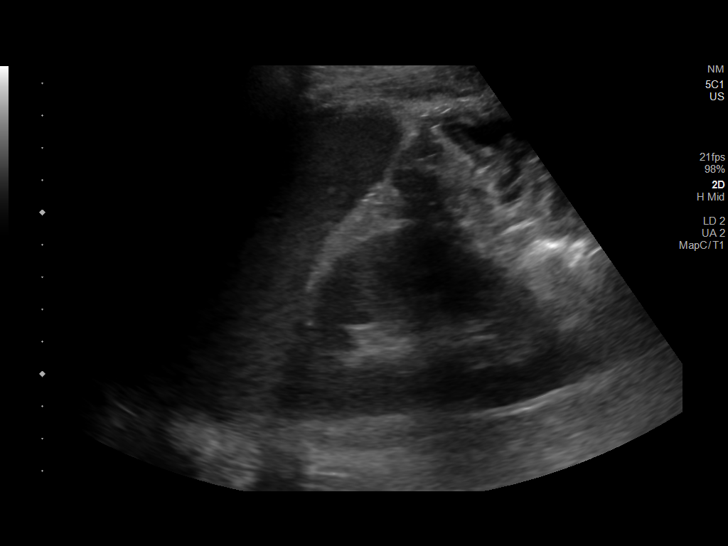
[im 20/37]
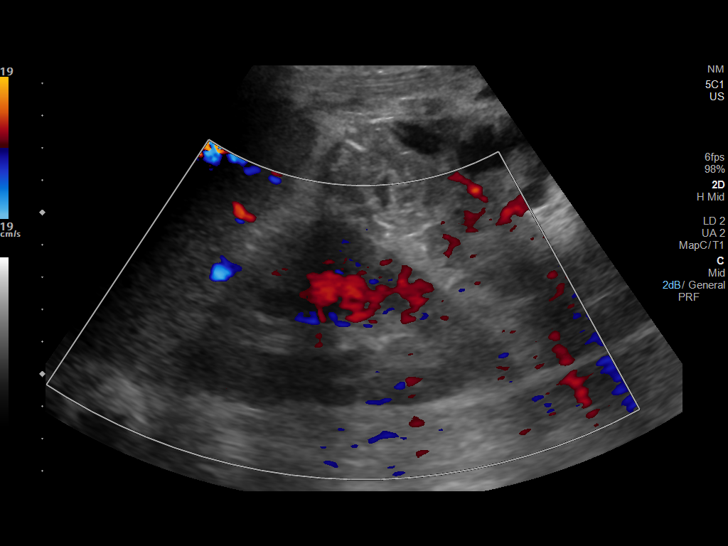
[im 23/37]
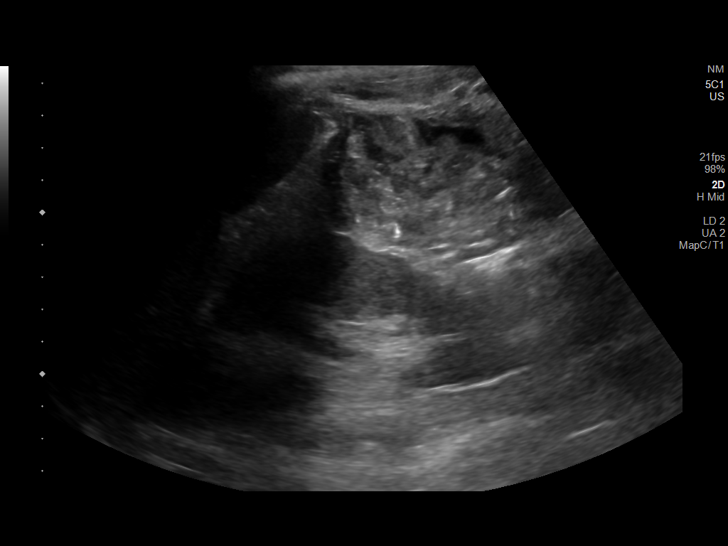
[im 25/37]
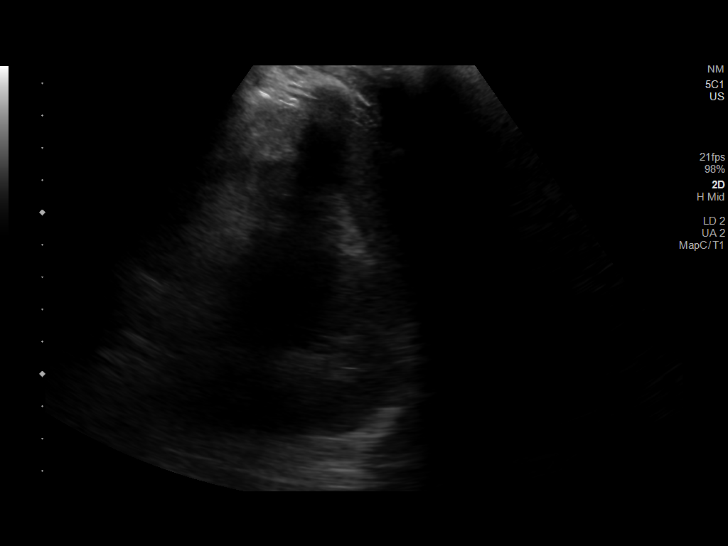
[im 28/37]
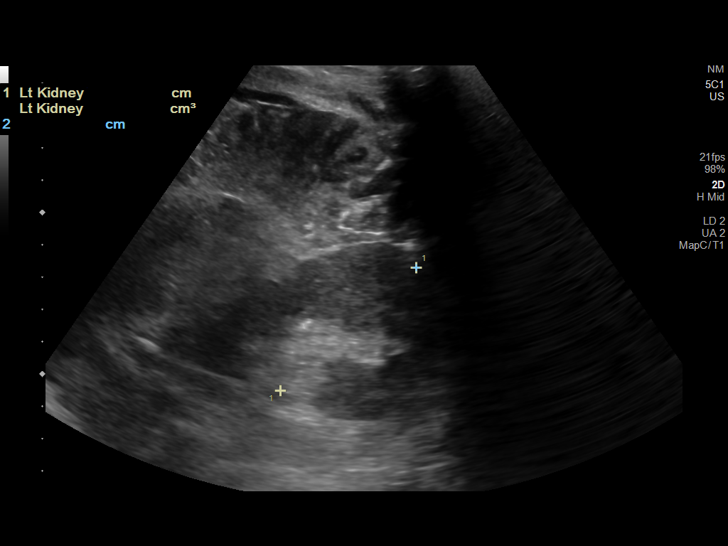
[im 31/37]
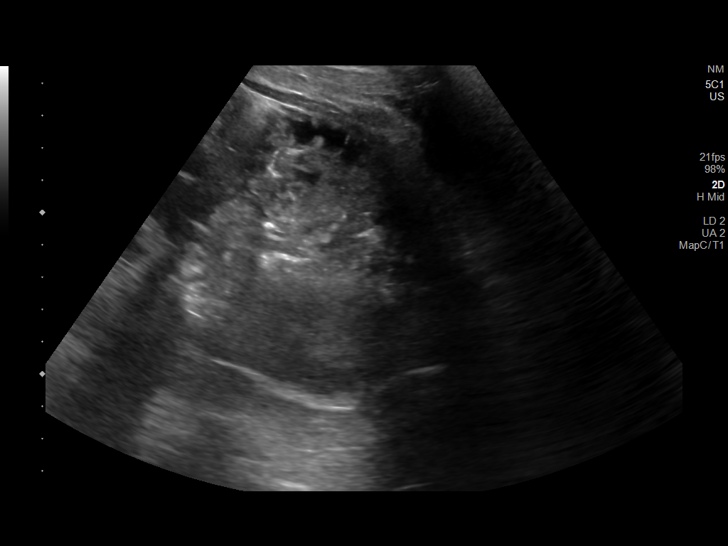
[im 34/37]
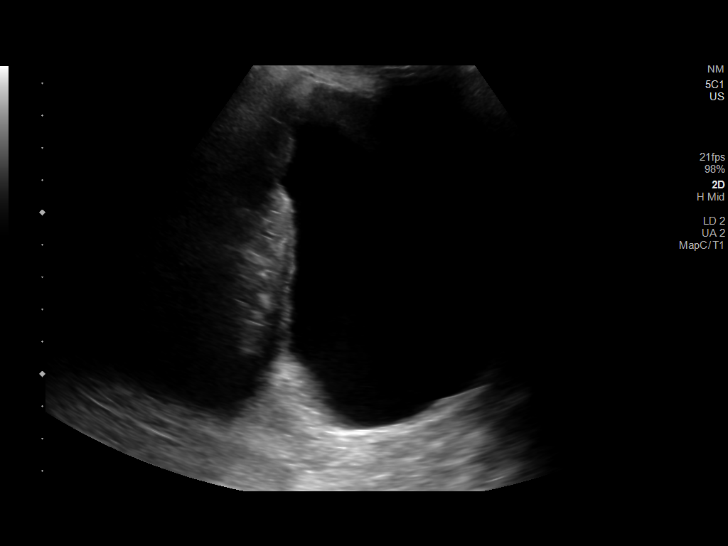
[im 37/37]
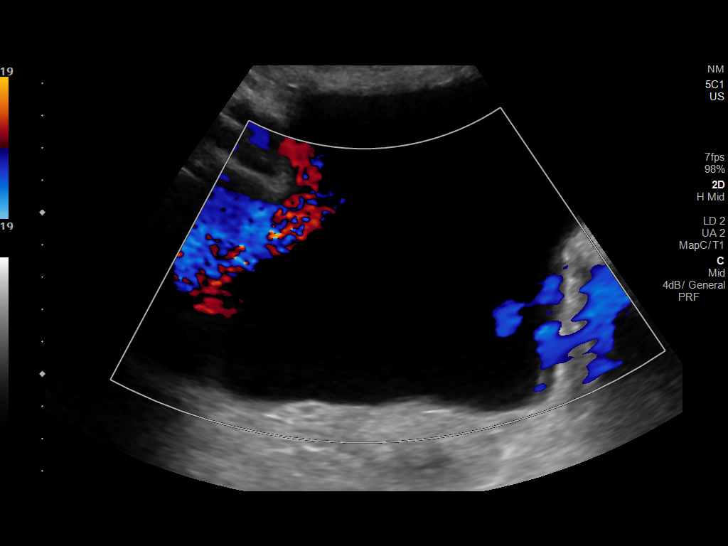

[14 of 25 positions shown; findings below may reference images not displayed]

FINDINGS: Right Kidney:

Renal measurements: 8.9 x 4.3 x 5.8 cm = volume: 116.3 mL .
Echogenicity within normal limits. No mass or hydronephrosis
visualized.

Left Kidney:

Renal measurements: 9.1 x 6.2 x 5.7 cm = volume: 164.8 mL.
Echogenicity within normal limits. No mass or hydronephrosis
visualized.

Bladder:

Full bladder.  Ureteral jets not visualized.
IMPRESSION: No acute process identified. Unremarkable ultrasound of the kidneys.

## 2019-06-14 IMAGING — DX ABDOMEN - 1 VIEW
1 series · 1 of 1 positions shown · non-contrast
Comparison: None.

CLINICAL DATA: Constipation and abdominal distention.

EXAM:
ABDOMEN - 1 VIEW

[abdomen]
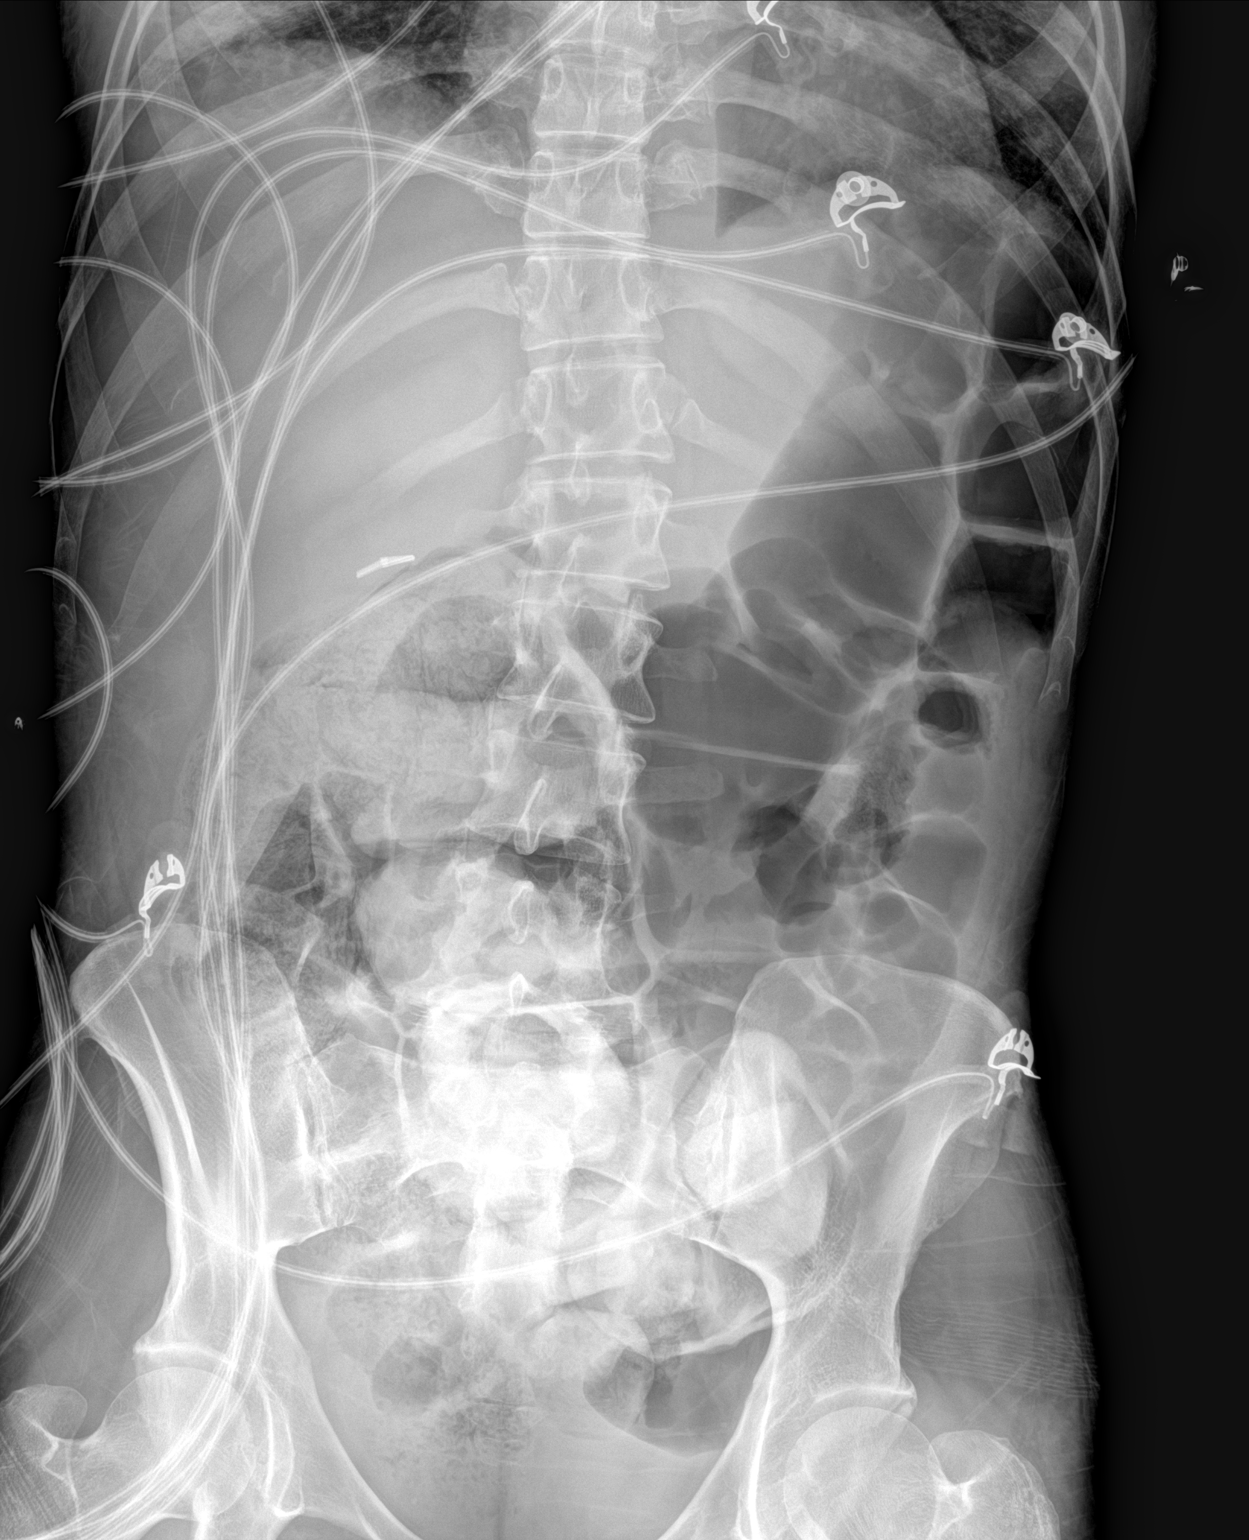

[1 of 1 positions shown; findings below may reference images not displayed]

FINDINGS: Mild prominence of gas and stool in the colon. No definite dilated
small bowel. Right upper quadrant clips likely from cholecystectomy.
Indistinct airspace opacity at the right lung base.
IMPRESSION: 1. Mild prominence of gas and stool in the colon, otherwise
unremarkable bowel gas pattern.
2. Indistinct airspace opacity at the right lung base, possibly from
pneumonia.

## 2019-06-14 IMAGING — CT CT CHEST WITHOUT CONTRAST
2 of 4 series · 15 of 36 positions shown, 18 images · non-contrast
Comparison: Radiograph [DATE]

CLINICAL DATA: Chest pain.  Chills, rash.

EXAM:
CT CHEST WITHOUT CONTRAST
TECHNIQUE: Multidetector CT imaging of the chest was performed following the
standard protocol without IV contrast.

[Series 4: thorax 2.0 · axial · 0.66mm/px · z∈[+1127,+1355]mm · 12 of 134 slices shown, 15 images]
[im 10/134  mediastinal]
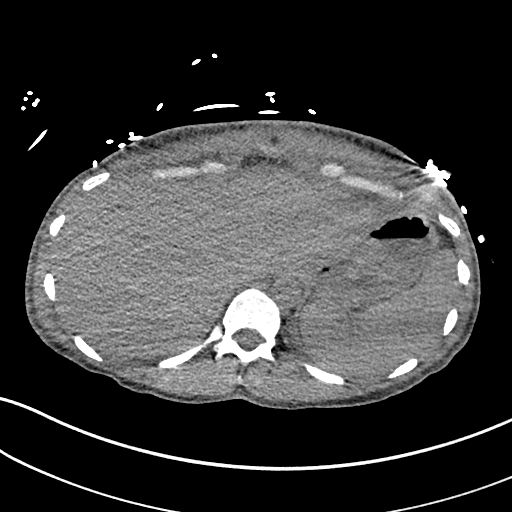
[im 10/134  lung]
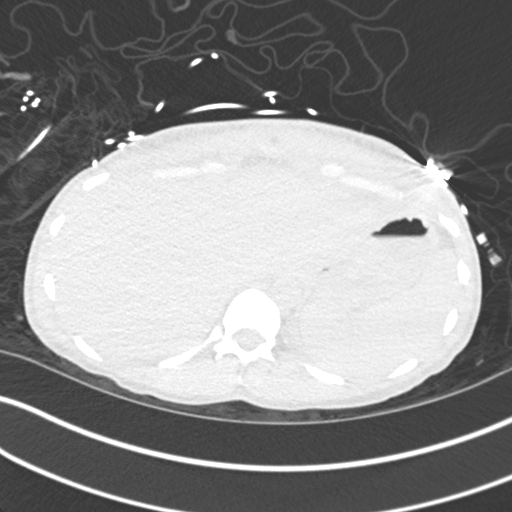
[im 20/134  lung]
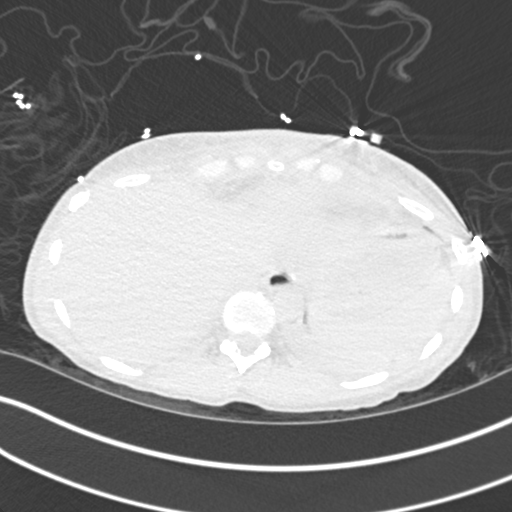
[im 29/134  lung]
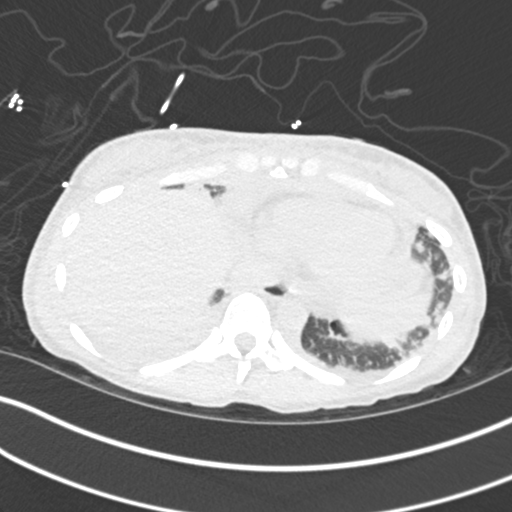
[im 39/134  lung]
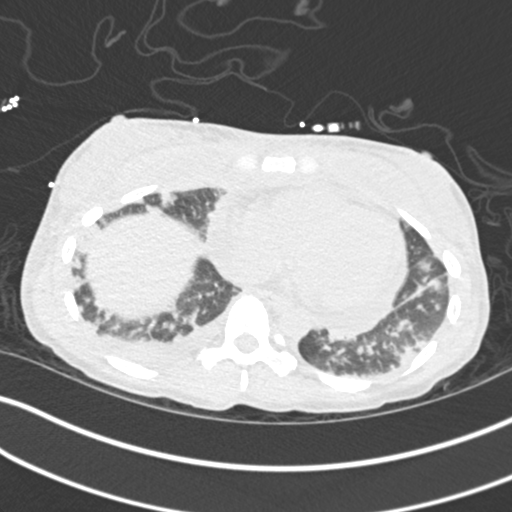
[im 48/134  mediastinal]
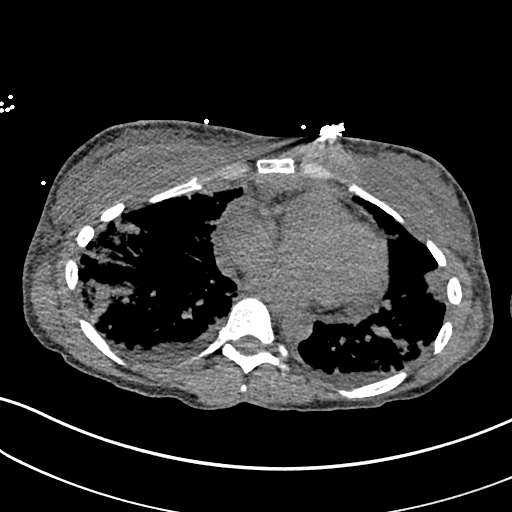
[im 48/134  lung]
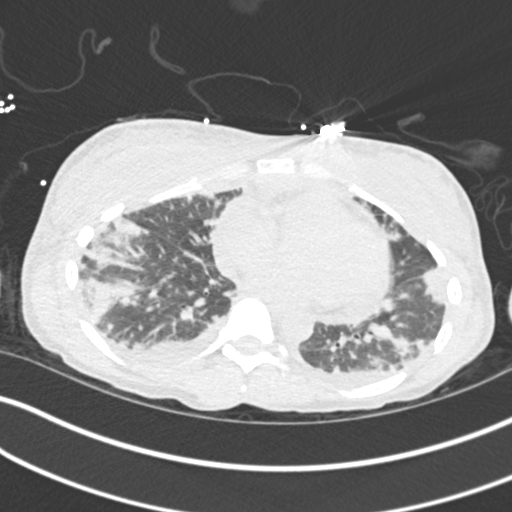
[im 58/134  lung]
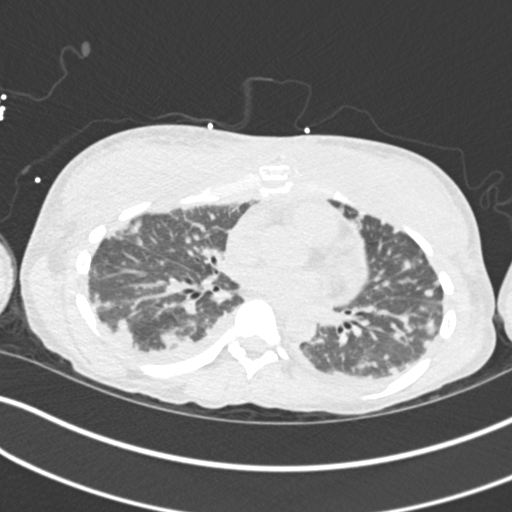
[im 77/134  lung]
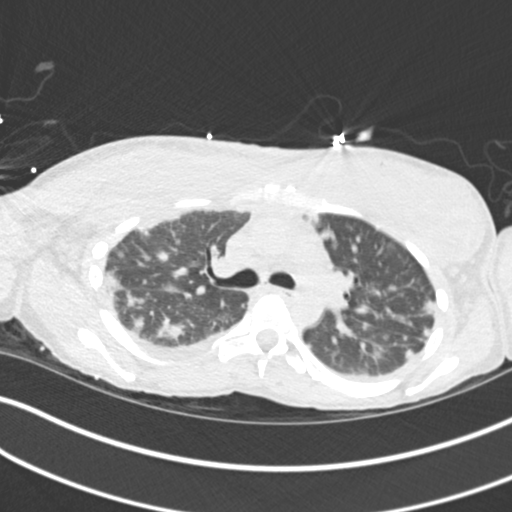
[im 86/134  lung]
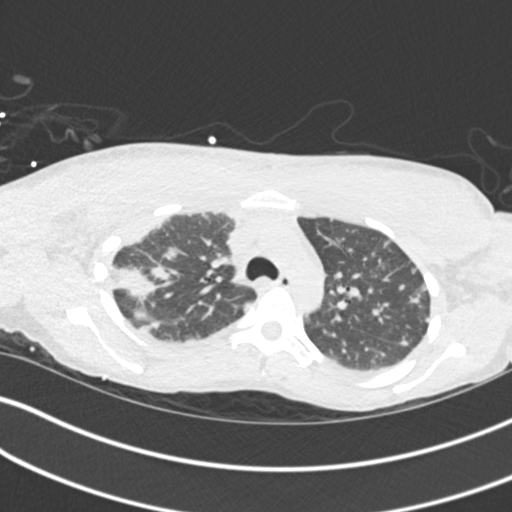
[im 96/134  mediastinal]
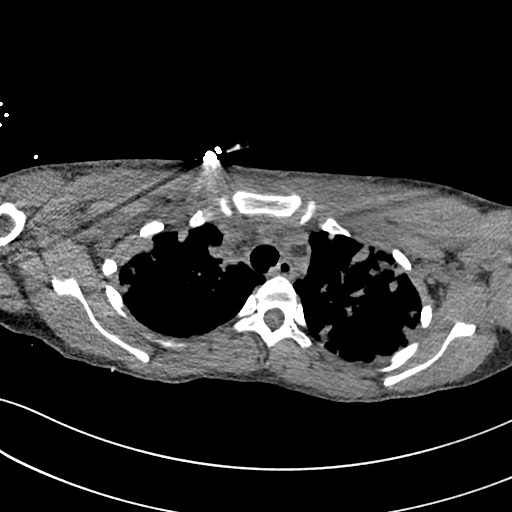
[im 96/134  lung]
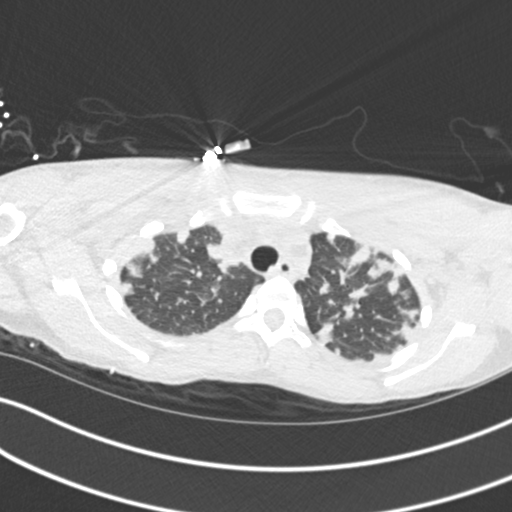
[im 105/134  lung]
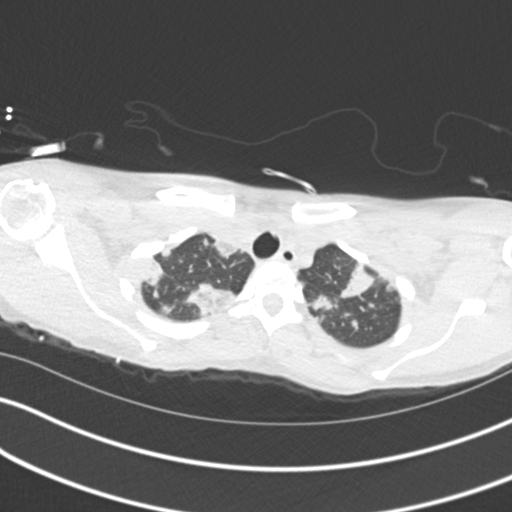
[im 115/134  lung]
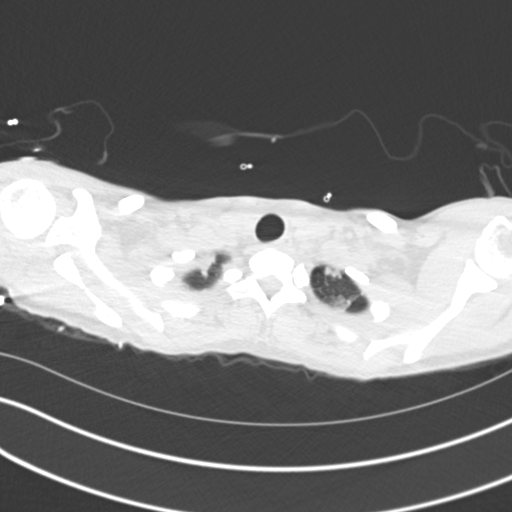
[im 124/134  lung]
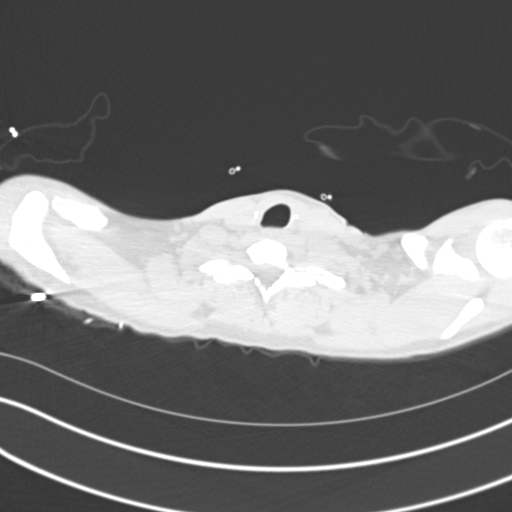

[Series 6: coronal · coronal · 0.59mm/px · 3 of 100 slices shown]
[im 20/100  lung]
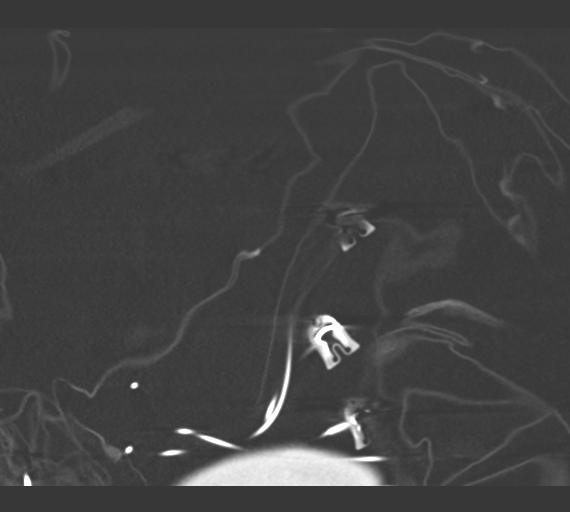
[im 40/100  lung]
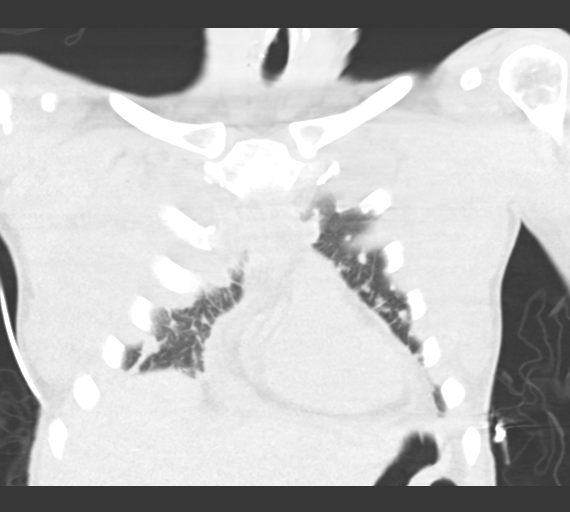
[im 60/100  lung]
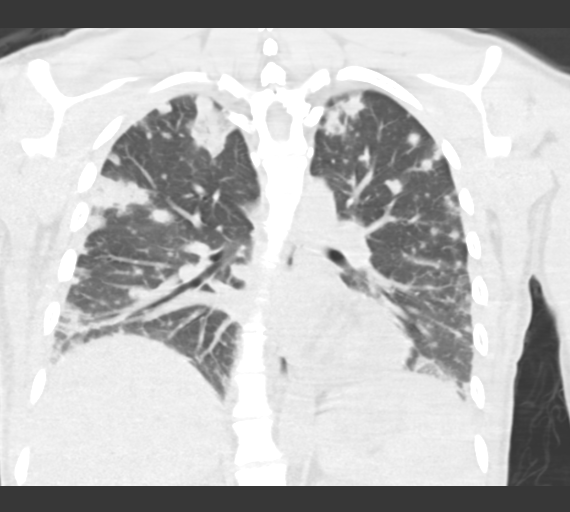

[15 of 36 positions shown; findings below may reference images not displayed]

FINDINGS: Cardiovascular: No acute vascular findings identified on noncontrast
CT. However there is pericardial thickening as well as pericardial
fluid. There is thickening along the pericardial margin.

Mediastinum/Nodes: No axillary or supraclavicular adenopathy.
Mediastinal lymph nodes are difficult to evaluate.

Lungs/Pleura: Multiple bilateral flame shaped pulmonary nodules as
well small round pulmonary nodules. These are in a random vascular
distribution. Nodules range from 5 mm to 20 mm. Small bilateral
pleural effusions.

Upper Abdomen: Limited view of the upper abdomen is unremarkable

Musculoskeletal: low-attenuation within the pectoralis musculature
LEFT and RIGHT. There is edema of the musculature of the chest wall
with which is thickened to 3 cm (image 64/4). There scattered foci
of gas within this edematous muscle.

Additionally there is a fracture of the sternum with 1 bone with
anterior displacement of the inferior fracture fragment. Small
amount gas adjacent to the fracture. (Image 74/4.
IMPRESSION: 1. Sternal fracture with one both width displacement.
2. Low-density edema and gas within the pectoralis muscles
concerning for myositis and potential abscess.
3. Bilateral septic pulmonary emboli.  Severe pulmonary infection.
4. Pericardial effusion with pericardial thickening concern for
pericarditis.

## 2019-06-14 IMAGING — DX PORTABLE CHEST - 1 VIEW
1 series · 1 of 1 positions shown · non-contrast
Comparison: [DATE]

CLINICAL DATA: Shortness of breath, alcohol use, drug use, shingles

EXAM:
PORTABLE CHEST 1 VIEW

[chest]
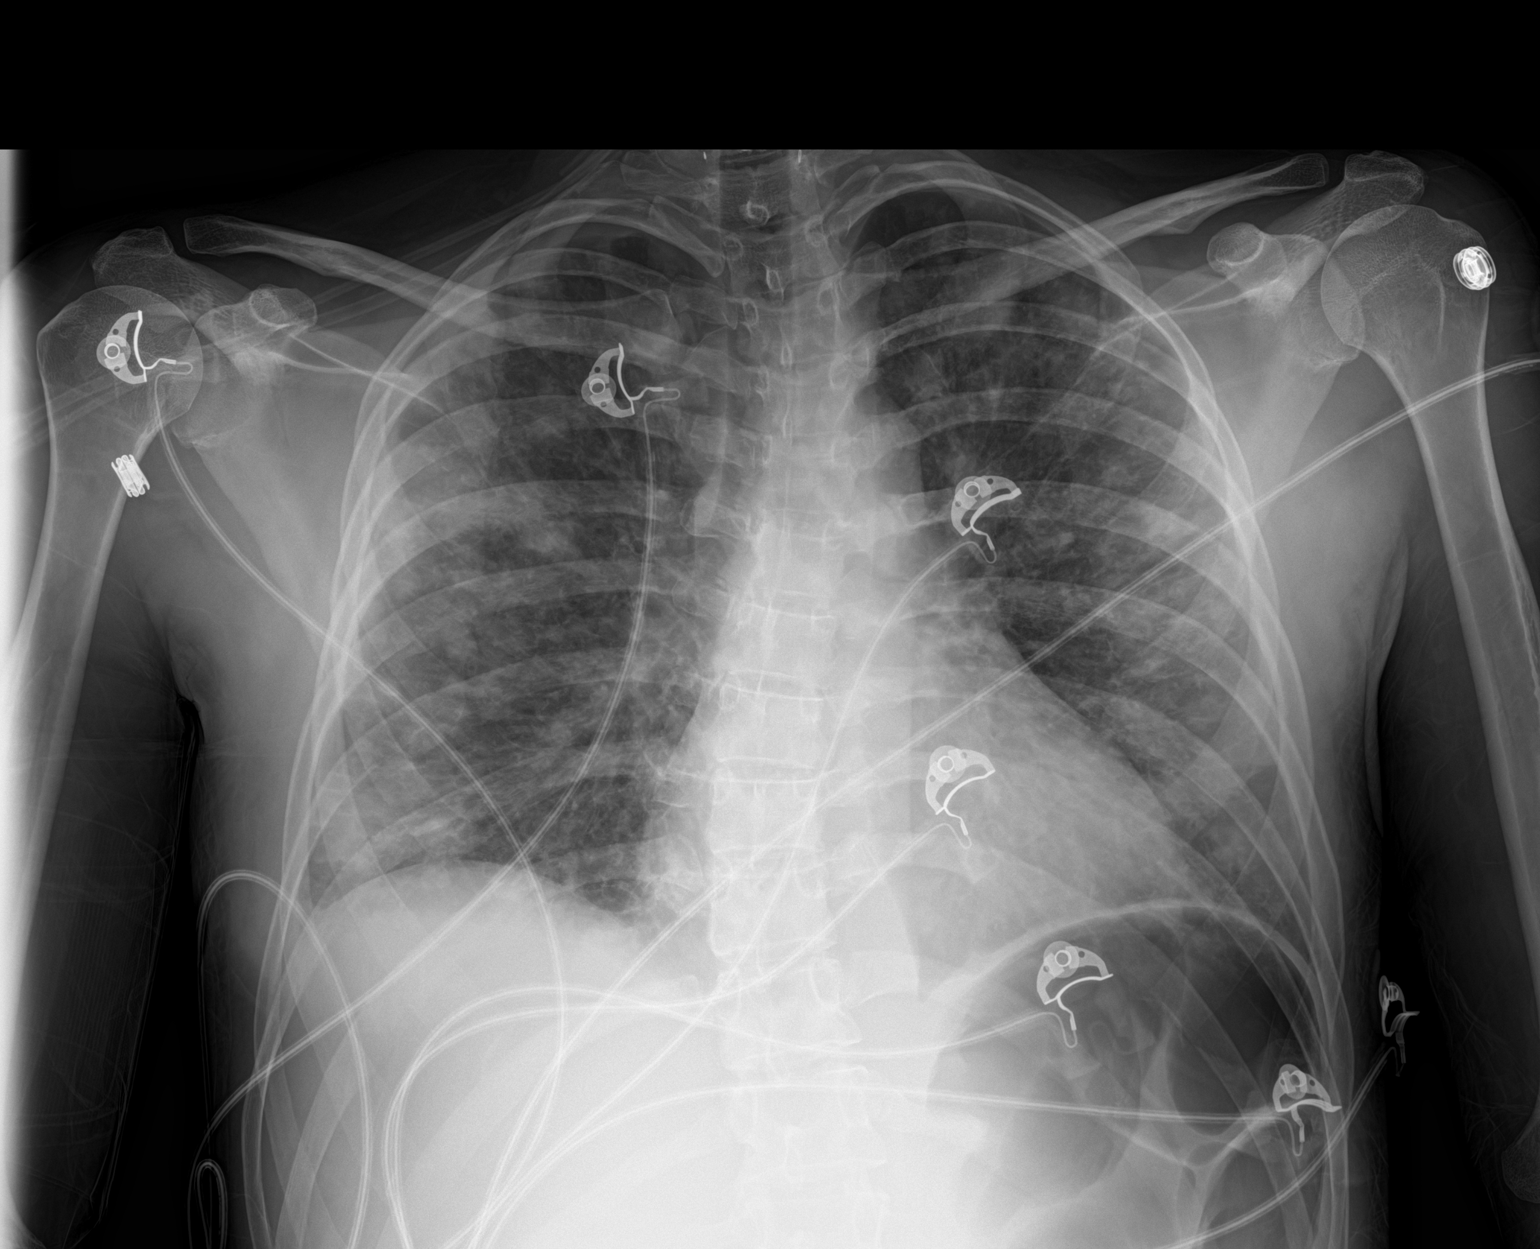

[1 of 1 positions shown; findings below may reference images not displayed]

FINDINGS: Diffuse extensive bilateral nodular airspace opacities, some with
central lucency suggesting cavitation. Appearance is nonspecific for
multifocal bilateral pneumonia, atypical infection, septic emboli
(given the history of drug use) or less likely metastatic process.
No large effusion or pneumothorax. Trachea midline. Normal heart
size. Scoliosis of the spine noted. No acute osseous finding.
IMPRESSION: Diffuse extensive nodular airspace opacities, see above comment.

## 2019-06-14 MED ORDER — SENNOSIDES-DOCUSATE SODIUM 8.6-50 MG PO TABS
1.0000 | ORAL_TABLET | Freq: Every evening | ORAL | Status: DC | PRN
  Administered 2019-06-30: 10:00:00 1 via ORAL
  Filled 2019-06-14: qty 1

## 2019-06-14 MED ORDER — VANCOMYCIN HCL IN DEXTROSE 1-5 GM/200ML-% IV SOLN
1000.0000 mg | Freq: Once | INTRAVENOUS | Status: AC
  Administered 2019-06-14: 1000 mg via INTRAVENOUS
  Filled 2019-06-14: qty 200

## 2019-06-14 MED ORDER — SODIUM CHLORIDE 0.9% FLUSH
3.0000 mL | Freq: Once | INTRAVENOUS | Status: AC
  Administered 2019-06-14: 3 mL via INTRAVENOUS

## 2019-06-14 MED ORDER — MORPHINE SULFATE (PF) 4 MG/ML IV SOLN
4.0000 mg | Freq: Once | INTRAVENOUS | Status: AC
  Administered 2019-06-14: 12:00:00 4 mg via INTRAVENOUS
  Filled 2019-06-14: qty 1

## 2019-06-14 MED ORDER — PROMETHAZINE HCL 25 MG PO TABS
12.5000 mg | ORAL_TABLET | Freq: Four times a day (QID) | ORAL | Status: DC | PRN
  Administered 2019-06-21 – 2019-07-29 (×15): 12.5 mg via ORAL
  Filled 2019-06-14 (×18): qty 1

## 2019-06-14 MED ORDER — LEVOTHYROXINE SODIUM 75 MCG PO TABS
225.0000 ug | ORAL_TABLET | ORAL | Status: DC
  Administered 2019-06-15 – 2019-07-27 (×7): 225 ug via ORAL
  Filled 2019-06-14 (×13): qty 3

## 2019-06-14 MED ORDER — METRONIDAZOLE IN NACL 5-0.79 MG/ML-% IV SOLN
500.0000 mg | Freq: Once | INTRAVENOUS | Status: DC
  Filled 2019-06-14: qty 100

## 2019-06-14 MED ORDER — HEPARIN SODIUM (PORCINE) 5000 UNIT/ML IJ SOLN
5000.0000 [IU] | Freq: Three times a day (TID) | INTRAMUSCULAR | Status: DC
  Administered 2019-06-15 – 2019-07-01 (×51): 5000 [IU] via SUBCUTANEOUS
  Filled 2019-06-14 (×51): qty 1

## 2019-06-14 MED ORDER — DEXTROSE 5 % IV SOLN
10.0000 mg/kg | Freq: Once | INTRAVENOUS | Status: AC
  Administered 2019-06-14: 475 mg via INTRAVENOUS
  Filled 2019-06-14: qty 9.5

## 2019-06-14 MED ORDER — ACETAMINOPHEN 325 MG PO TABS
650.0000 mg | ORAL_TABLET | Freq: Four times a day (QID) | ORAL | Status: DC | PRN
  Administered 2019-06-15 – 2019-06-24 (×12): 650 mg via ORAL
  Filled 2019-06-14 (×13): qty 2

## 2019-06-14 MED ORDER — MORPHINE SULFATE (PF) 4 MG/ML IV SOLN
4.0000 mg | Freq: Once | INTRAVENOUS | Status: AC
  Administered 2019-06-14: 15:00:00 4 mg via INTRAVENOUS
  Filled 2019-06-14: qty 1

## 2019-06-14 MED ORDER — SODIUM CHLORIDE 0.9 % IV BOLUS (SEPSIS)
500.0000 mL | Freq: Once | INTRAVENOUS | Status: AC
  Administered 2019-06-14: 500 mL via INTRAVENOUS

## 2019-06-14 MED ORDER — SODIUM CHLORIDE 0.9 % IV BOLUS: 500.0000 mL | Freq: Once | INTRAVENOUS | Status: DC

## 2019-06-14 MED ORDER — LORAZEPAM 2 MG/ML IJ SOLN
0.5000 mg | Freq: Once | INTRAMUSCULAR | Status: AC
  Administered 2019-06-14: 13:00:00 0.5 mg via INTRAVENOUS
  Filled 2019-06-14: qty 1

## 2019-06-14 MED ORDER — SODIUM CHLORIDE 0.9 % IV BOLUS (SEPSIS)
1000.0000 mL | Freq: Once | INTRAVENOUS | Status: AC
  Administered 2019-06-14: 1000 mL via INTRAVENOUS

## 2019-06-14 MED ORDER — LEVOTHYROXINE SODIUM 75 MCG PO TABS
150.0000 ug | ORAL_TABLET | ORAL | Status: DC
  Administered 2019-06-16 – 2019-07-31 (×38): 150 ug via ORAL
  Filled 2019-06-14 (×32): qty 2

## 2019-06-14 MED ORDER — FENTANYL 25 MCG/HR TD PT72
1.0000 | MEDICATED_PATCH | TRANSDERMAL | Status: DC
  Administered 2019-06-14: 1 via TRANSDERMAL
  Filled 2019-06-14: qty 1

## 2019-06-14 MED ORDER — MIRTAZAPINE 15 MG PO TBDP
15.0000 mg | ORAL_TABLET | Freq: Every day | ORAL | Status: DC
  Administered 2019-06-15: 15 mg via ORAL
  Filled 2019-06-14 (×3): qty 1

## 2019-06-14 MED ORDER — LACTATED RINGERS IV BOLUS
1000.0000 mL | Freq: Once | INTRAVENOUS | Status: AC
  Administered 2019-06-14: 1000 mL via INTRAVENOUS

## 2019-06-14 MED ORDER — SODIUM CHLORIDE 0.9 % IV SOLN
2.0000 g | Freq: Once | INTRAVENOUS | Status: AC
  Administered 2019-06-14: 2 g via INTRAVENOUS
  Filled 2019-06-14: qty 2

## 2019-06-14 MED ORDER — ACETAMINOPHEN 650 MG RE SUPP: 650.0000 mg | Freq: Four times a day (QID) | RECTAL | Status: DC | PRN

## 2019-06-14 MED ORDER — LACTATED RINGERS IV BOLUS
1000.0000 mL | Freq: Once | INTRAVENOUS | Status: AC
  Administered 2019-06-14: 17:00:00 1000 mL via INTRAVENOUS

## 2019-06-14 MED ORDER — LEVOTHYROXINE SODIUM 75 MCG PO TABS: 150.0000 ug | ORAL_TABLET | ORAL | Status: DC

## 2019-06-14 MED ORDER — HYDROMORPHONE HCL 1 MG/ML IJ SOLN
0.5000 mg | Freq: Once | INTRAMUSCULAR | Status: AC
  Administered 2019-06-14: 17:00:00 0.5 mg via INTRAVENOUS
  Filled 2019-06-14: qty 1
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
## 2019-06-14 NOTE — ED Notes (Signed)
Called 5w in attempt to give report. Stated pt is not assigned yet.
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
## 2019-06-14 NOTE — ED Triage Notes (Signed)
Pt arrives via [facility] c/o rash with pain, blistering across chest, with SOB.  Pt endorses ETOH, and last heroin use x 2 days.  Pt c./o constipation.

## 2019-06-14 NOTE — Progress Notes (Signed)
Patient assigned to 5W at [phone]

## 2019-06-14 NOTE — ED Notes (Signed)
Pt was here a few days ago as a heroin overdose -- today has c/o chest pain with breathing, shingles/blister type rash to midsternum and around waist area.

## 2019-06-15 ENCOUNTER — Other Ambulatory Visit (HOSPITAL_COMMUNITY): Payer: Self-pay

## 2019-06-15 DIAGNOSIS — B9562 Methicillin resistant Staphylococcus aureus infection as the cause of diseases classified elsewhere: Secondary | ICD-10-CM

## 2019-06-15 DIAGNOSIS — F1990 Other psychoactive substance use, unspecified, uncomplicated: Secondary | ICD-10-CM

## 2019-06-15 DIAGNOSIS — M609 Myositis, unspecified: Secondary | ICD-10-CM

## 2019-06-15 DIAGNOSIS — N179 Acute kidney failure, unspecified: Secondary | ICD-10-CM

## 2019-06-15 DIAGNOSIS — I313 Pericardial effusion (noninflammatory): Secondary | ICD-10-CM

## 2019-06-15 DIAGNOSIS — R0989 Other specified symptoms and signs involving the circulatory and respiratory systems: Secondary | ICD-10-CM

## 2019-06-15 DIAGNOSIS — F1120 Opioid dependence, uncomplicated: Secondary | ICD-10-CM | POA: Insufficient documentation

## 2019-06-15 DIAGNOSIS — F17210 Nicotine dependence, cigarettes, uncomplicated: Secondary | ICD-10-CM

## 2019-06-15 DIAGNOSIS — I3139 Other pericardial effusion (noninflammatory): Secondary | ICD-10-CM

## 2019-06-15 DIAGNOSIS — F319 Bipolar disorder, unspecified: Secondary | ICD-10-CM

## 2019-06-15 DIAGNOSIS — Z85850 Personal history of malignant neoplasm of thyroid: Secondary | ICD-10-CM

## 2019-06-15 DIAGNOSIS — R21 Rash and other nonspecific skin eruption: Secondary | ICD-10-CM

## 2019-06-15 DIAGNOSIS — R7881 Bacteremia: Secondary | ICD-10-CM

## 2019-06-15 DIAGNOSIS — I2690 Septic pulmonary embolism without acute cor pulmonale: Secondary | ICD-10-CM

## 2019-06-15 LAB — BLOOD CULTURE ID PANEL (REFLEXED)

## 2019-06-15 LAB — CBC
HCT: 26.1 % — ABNORMAL LOW (ref 36.0–46.0)
Hemoglobin: 9.2 g/dL — ABNORMAL LOW (ref 12.0–15.0)
MCH: 29.3 pg (ref 26.0–34.0)
MCHC: 35.2 g/dL (ref 30.0–36.0)
MCV: 83.1 fL (ref 80.0–100.0)
Platelets: 194 10*3/uL (ref 150–400)
RBC: 3.14 MIL/uL — ABNORMAL LOW (ref 3.87–5.11)
RDW: 15.2 % (ref 11.5–15.5)
WBC: 9.6 10*3/uL (ref 4.0–10.5)
nRBC: 0 % (ref 0.0–0.2)

## 2019-06-15 LAB — COMPREHENSIVE METABOLIC PANEL
ALT: 26 U/L (ref 0–44)
AST: 62 U/L — ABNORMAL HIGH (ref 15–41)
Albumin: 1.7 g/dL — ABNORMAL LOW (ref 3.5–5.0)
Alkaline Phosphatase: 182 U/L — ABNORMAL HIGH (ref 38–126)
Anion gap: 14 (ref 5–15)
BUN: 44 mg/dL — ABNORMAL HIGH (ref 6–20)
CO2: 22 mmol/L (ref 22–32)
Calcium: 7.5 mg/dL — ABNORMAL LOW (ref 8.9–10.3)
Chloride: 101 mmol/L (ref 98–111)
Creatinine, Ser: 1.77 mg/dL — ABNORMAL HIGH (ref 0.44–1.00)
GFR calc Af Amer: 42 mL/min — ABNORMAL LOW (ref >60)
GFR calc non Af Amer: 36 mL/min — ABNORMAL LOW (ref >60)
Glucose, Bld: 69 mg/dL — ABNORMAL LOW (ref 70–99)
Potassium: 3.8 mmol/L (ref 3.5–5.1)
Sodium: 137 mmol/L (ref 135–145)
Total Bilirubin: 1.3 mg/dL — ABNORMAL HIGH (ref 0.3–1.2)
Total Protein: 5.7 g/dL — ABNORMAL LOW (ref 6.5–8.1)

## 2019-06-15 LAB — NOVEL CORONAVIRUS, NAA (HOSP ORDER, SEND-OUT TO REF LAB; TAT 18-24 HRS): SARS-CoV-2, NAA: NOT DETECTED

## 2019-06-15 LAB — HIV ANTIBODY (ROUTINE TESTING W REFLEX): HIV Screen 4th Generation wRfx: NONREACTIVE

## 2019-06-15 MED ORDER — METHADONE HCL 10 MG PO TABS
20.0000 mg | ORAL_TABLET | Freq: Two times a day (BID) | ORAL | Status: DC
  Administered 2019-06-15 – 2019-06-20 (×11): 20 mg via ORAL
  Filled 2019-06-15 (×11): qty 2

## 2019-06-15 MED ORDER — FLUOXETINE HCL 40 MG PO CAPS: 40.0000 mg | ORAL_CAPSULE | Freq: Every day | ORAL | Status: DC

## 2019-06-15 MED ORDER — VANCOMYCIN VARIABLE DOSE PER UNSTABLE RENAL FUNCTION (PHARMACIST DOSING): Status: DC

## 2019-06-15 MED ORDER — SODIUM CHLORIDE 0.9 % IV SOLN
INTRAVENOUS | Status: AC
  Administered 2019-06-15: 13:00:00 via INTRAVENOUS

## 2019-06-15 MED ORDER — BUSPIRONE HCL 10 MG PO TABS
10.0000 mg | ORAL_TABLET | Freq: Two times a day (BID) | ORAL | Status: DC
  Administered 2019-06-15 – 2019-07-31 (×93): 10 mg via ORAL
  Filled 2019-06-15 (×3): qty 1
  Filled 2019-06-15: qty 2
  Filled 2019-06-15 (×5): qty 1
  Filled 2019-06-15: qty 2
  Filled 2019-06-15 (×2): qty 1
  Filled 2019-06-15: qty 2
  Filled 2019-06-15 (×7): qty 1
  Filled 2019-06-15: qty 2
  Filled 2019-06-15 (×2): qty 1
  Filled 2019-06-15: qty 2
  Filled 2019-06-15 (×13): qty 1
  Filled 2019-06-15 (×2): qty 2
  Filled 2019-06-15 (×4): qty 1
  Filled 2019-06-15: qty 2
  Filled 2019-06-15 (×10): qty 1
  Filled 2019-06-15: qty 2
  Filled 2019-06-15 (×4): qty 1
  Filled 2019-06-15: qty 2
  Filled 2019-06-15: qty 1
  Filled 2019-06-15: qty 2
  Filled 2019-06-15 (×19): qty 1
  Filled 2019-06-15: qty 2
  Filled 2019-06-15 (×6): qty 1
  Filled 2019-06-15: qty 2
  Filled 2019-06-15 (×4): qty 1

## 2019-06-15 MED ORDER — HYDROMORPHONE HCL 1 MG/ML IJ SOLN
0.5000 mg | Freq: Four times a day (QID) | INTRAMUSCULAR | Status: DC | PRN
  Administered 2019-06-15 – 2019-06-18 (×9): 0.5 mg via INTRAVENOUS
  Filled 2019-06-15 (×9): qty 1

## 2019-06-15 MED ORDER — HYDROXYZINE HCL 25 MG PO TABS
25.0000 mg | ORAL_TABLET | Freq: Three times a day (TID) | ORAL | Status: DC | PRN
  Administered 2019-06-15 – 2019-07-29 (×28): 25 mg via ORAL
  Filled 2019-06-15 (×30): qty 1

## 2019-06-15 MED ORDER — VANCOMYCIN HCL IN DEXTROSE 750-5 MG/150ML-% IV SOLN
750.0000 mg | INTRAVENOUS | Status: DC
  Administered 2019-06-15 – 2019-06-16 (×2): 750 mg via INTRAVENOUS
  Filled 2019-06-15 (×3): qty 150

## 2019-06-15 MED ORDER — HYDROMORPHONE HCL 1 MG/ML IJ SOLN
0.5000 mg | Freq: Once | INTRAMUSCULAR | Status: AC
  Administered 2019-06-15: 0.5 mg via INTRAVENOUS
  Filled 2019-06-15: qty 1

## 2019-06-15 NOTE — Progress Notes (Signed)
PHARMACY - PHYSICIAN COMMUNICATION CRITICAL VALUE ALERT - BLOOD CULTURE IDENTIFICATION (BCID)  Monica Stephens is an 38 y.o. female who presented to [facility] on 06/14/2019 with a chief complaint of chest pain and SOB  Assessment:  3/3 BC positive for MRSA  Name of physician (or Provider) Contacted:  Rehman  Current antibiotics: no standing orders, ordered acyclovir, vancomycin, cefepime and flagyl in ED  Changes to prescribed antibiotics recommended:  Continue vancomycin  Results for orders placed or performed during the hospital encounter of 06/14/19  Blood Culture ID Panel (Reflexed) (Collected: 06/14/2019 10:45 AM)  Result Value Ref Range   Enterococcus species NOT DETECTED NOT DETECTED   Listeria monocytogenes NOT DETECTED NOT DETECTED   Staphylococcus species DETECTED (A) NOT DETECTED   Staphylococcus aureus (BCID) DETECTED (A) NOT DETECTED   Methicillin resistance DETECTED (A) NOT DETECTED   Streptococcus species NOT DETECTED NOT DETECTED   Streptococcus agalactiae NOT DETECTED NOT DETECTED   Streptococcus pneumoniae NOT DETECTED NOT DETECTED   Streptococcus pyogenes NOT DETECTED NOT DETECTED   Acinetobacter baumannii NOT DETECTED NOT DETECTED   Enterobacteriaceae species NOT DETECTED NOT DETECTED   Enterobacter cloacae complex NOT DETECTED NOT DETECTED   Escherichia coli NOT DETECTED NOT DETECTED   Klebsiella oxytoca NOT DETECTED NOT DETECTED   Klebsiella pneumoniae NOT DETECTED NOT DETECTED   Proteus species NOT DETECTED NOT DETECTED   Serratia marcescens NOT DETECTED NOT DETECTED   Haemophilus influenzae NOT DETECTED NOT DETECTED   Neisseria meningitidis NOT DETECTED NOT DETECTED   Pseudomonas aeruginosa NOT DETECTED NOT DETECTED   Candida albicans NOT DETECTED NOT DETECTED   Candida glabrata NOT DETECTED NOT DETECTED   Candida krusei NOT DETECTED NOT DETECTED   Candida parapsilosis NOT DETECTED NOT DETECTED   Candida tropicalis NOT DETECTED NOT DETECTED    Excell Seltzer Poteet 06/15/2019  1:45 AM

## 2019-06-15 NOTE — Progress Notes (Addendum)
   Subjective: Ms. Selvy does appear very uncomfortable this morning.  She states that her generalized pain has improved with the fentanyl patch but she does continue to have pain everywhere.  She does report improvement in her chest pain but does state that it is making her very hard to breathe.  Additionally she states that she has a lot of anxiety.   Objective:  Vital signs in last 24 hours: Vitals:   06/15/19 0017 06/15/19 0217 06/15/19 0417 06/15/19 0817  BP: 104/70 113/73 115/66 (!) 108/59  Pulse: (!) 114 (!) 114 (!) 117 (!) 112  Resp:      Temp: 98.1 F (36.7 C)  97.6 F (36.4 C) 97.8 F (36.6 C)  TempSrc: Oral  Oral Oral  SpO2: 97% 95% 94%   Weight:   53.4 kg   Height:       General: Lying in bed in no acute distress Cardiovascular: Tachycardic up to 110s, normal rhythm Respiratory: Clear to auscultation bilaterally, normal work of breathing Skin: Vesicular rash of the chest, epigastrium, and left lateral abdomen is dry and crusted.  No new weeping lesions visualized.  Very tender to palpation.  Assessment/Plan:  Principal Problem:   MRSA bacteremia Active Problems:   Opioid overdose ([place])   Septic pulmonary embolism, bilateral    Suspected endocarditis   Active intravenous drug use   Severe opioid dependence ([place])   Pericardial effusion  Ms. Hou is a 38 year old female with polysubstance abuse including IV heroin, bipolar, history of thyroid cancer who presents with chest pain and dyspnea and found to have bilateral septic pulmonary emboli with severe pulmonary infection and MRSA bacteremia.  MRSA bacteremia, septic emboli with pulmonary opacities:  3/3 blood cultures MRSA positive. - Narrow antibiotics to vancomycin only. - Follow-up ID recs - Follow-up HIV - Follow-up sputum culture - TEE to evaluate for septic endocarditis and valvular abnormalities.  - Pain medication as below  Opioid Use Disorder: - Will switch fentanyl patch to methadone 20 mg BID  and added Dilaudid 0.5 mg every 6 hours as needed breakthrough pain. - EKG in the morning to evaluate for worsening QT prolongation while on Methadone (baseline Qtc 494).   Vesicular rash: Likely varicella-zoster infection. - Continue to monitor, pain medications as above  Acute Kidney Injury:  Renal ultrasound unremarkable.  Likely prerenal azotemia. Creatinine improved today to 1.77 with IVF. - Will give an additional 1L IVF today.  Sternal fracture: Likely due to CPR - Pain meds as above  Pericardial effusion: Differential includes acute pericarditis vs blunt chest trauma secondary to recent CPR - TEE as above  Transaminitis: Elevetaed AST to 62. She did have a positive Hep B surface antigen in 2016 but not other testing. Will plan on screening for Hep B and C given previous results and history of IVDU. - Hep B surface antigen - Hep C antibody  FEN/GI: Regular Diet Code status: Full DVT PPX: Heparin  Dispo: Anticipated discharge pending ECHO results.  Carroll Sage, MD 06/15/2019, 12:54 PM Pager: [phone]

## 2019-06-15 NOTE — Progress Notes (Signed)
Pharmacy Antibiotic Note  Monica Stephens is a 38 y.o. female admitted on 06/14/2019 with MRSA bacteremia.  Pharmacy has been consulted for vancomycin dosing. Received vancomycin 1gm 12:17 on 6/14.  Her SrCr is elevated from baseline.  Plan: Check random vancomycin level for redose  Height: 5\' 2"  (157.5 cm) Weight: 117 lb 11.6 oz (53.4 kg) IBW/kg (Calculated) : 50.1  Temp (24hrs), Avg:97.8 F (36.6 C), Min:96.7 F (35.9 C), Max:98.5 F (36.9 C)  Recent Labs  Lab 06/14/19 1036 06/14/19 1231 06/14/19 1833  WBC 14.5*  --  9.3  CREATININE 2.84*  --   --   LATICACIDVEN 2.6* 1.8  --     Estimated Creatinine Clearance: 21.7 mL/min (A) (by C-G formula based on SCr of 2.84 mg/dL (H)).    No Known Allergies   Thank you for allowing pharmacy to be a part of this patient's care.  Excell Seltzer Poteet 06/15/2019 1:49 AM

## 2019-06-15 NOTE — Progress Notes (Signed)
Pharmacy Antibiotic Note  Monica Stephens is a 38 y.o. female admitted on 06/14/2019 with MRSA bacteremia.  Pharmacy has been consulted for vancomycin dosing. Received vancomycin 1gm 12:17 on 6/14.  SCr today is improved to 1.77 from 2.84 yesterday. UOP is good with 1.7 cc/kg/hr and > 1 L total UOP in 24 hours.    Plan: Vancomycin IV 750 mg every 24 hours  Hold off on AUC dosing while in AKI  Monitor daily SCr, cultures, ECHO and clinical improvement   Height: 5\' 2"  (157.5 cm) Weight: 117 lb 11.6 oz (53.4 kg) IBW/kg (Calculated) : 50.1  Temp (24hrs), Avg:97.6 F (36.4 C), Min:96.7 F (35.9 C), Max:98.1 F (36.7 C)  Recent Labs  Lab 06/14/19 1036 06/14/19 1231 06/14/19 1833 06/15/19 0449  WBC 14.5*  --  9.3 9.6  CREATININE 2.84*  --   --  1.77*  LATICACIDVEN 2.6* 1.8  --   --     Estimated Creatinine Clearance: 34.8 mL/min (A) (by C-G formula based on SCr of 1.77 mg/dL (H)).    No Known Allergies   Thank you for allowing pharmacy to be a part of this patient's care.  Jimmy Footman, PharmD, BCPS, [place] [facility] Clinical Pharmacist Phone: [phone] 06/15/2019 11:07 AM
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
## 2019-06-16 ENCOUNTER — Inpatient Hospital Stay (HOSPITAL_COMMUNITY): Payer: Self-pay

## 2019-06-16 DIAGNOSIS — K5900 Constipation, unspecified: Secondary | ICD-10-CM

## 2019-06-16 DIAGNOSIS — X58XXXA Exposure to other specified factors, initial encounter: Secondary | ICD-10-CM

## 2019-06-16 DIAGNOSIS — M79604 Pain in right leg: Secondary | ICD-10-CM

## 2019-06-16 DIAGNOSIS — S2220XA Unspecified fracture of sternum, initial encounter for closed fracture: Secondary | ICD-10-CM

## 2019-06-16 DIAGNOSIS — I330 Acute and subacute infective endocarditis: Secondary | ICD-10-CM

## 2019-06-16 DIAGNOSIS — F1190 Opioid use, unspecified, uncomplicated: Secondary | ICD-10-CM

## 2019-06-16 DIAGNOSIS — I313 Pericardial effusion (noninflammatory): Secondary | ICD-10-CM

## 2019-06-16 DIAGNOSIS — R21 Rash and other nonspecific skin eruption: Secondary | ICD-10-CM

## 2019-06-16 DIAGNOSIS — M79605 Pain in left leg: Secondary | ICD-10-CM

## 2019-06-16 DIAGNOSIS — R0602 Shortness of breath: Secondary | ICD-10-CM

## 2019-06-16 LAB — BASIC METABOLIC PANEL
Anion gap: 12 (ref 5–15)
BUN: 40 mg/dL — ABNORMAL HIGH (ref 6–20)
CO2: 23 mmol/L (ref 22–32)
Calcium: 7.6 mg/dL — ABNORMAL LOW (ref 8.9–10.3)
Chloride: 102 mmol/L (ref 98–111)
Creatinine, Ser: 1.43 mg/dL — ABNORMAL HIGH (ref 0.44–1.00)
GFR calc Af Amer: 54 mL/min — ABNORMAL LOW (ref >60)
GFR calc non Af Amer: 47 mL/min — ABNORMAL LOW (ref >60)
Glucose, Bld: 83 mg/dL (ref 70–99)
Potassium: 3.9 mmol/L (ref 3.5–5.1)
Sodium: 137 mmol/L (ref 135–145)

## 2019-06-16 LAB — CULTURE, BLOOD (ROUTINE X 2)
Special Requests: ADEQUATE
Special Requests: ADEQUATE

## 2019-06-16 LAB — ECHOCARDIOGRAM COMPLETE
Height: 62 in
Weight: 1883.61 oz

## 2019-06-16 LAB — HEPATITIS C ANTIBODY: HCV Ab: 0.5 s/co ratio (ref 0.0–0.9)

## 2019-06-16 LAB — HEPATITIS B SURFACE ANTIGEN: Hepatitis B Surface Ag: NEGATIVE

## 2019-06-16 IMAGING — DX ABDOMEN - 1 VIEW
1 series · 1 of 1 positions shown · non-contrast
Comparison: [DATE]

CLINICAL DATA: Abdominal distention

EXAM:
ABDOMEN - 1 VIEW

[abdomen kub]
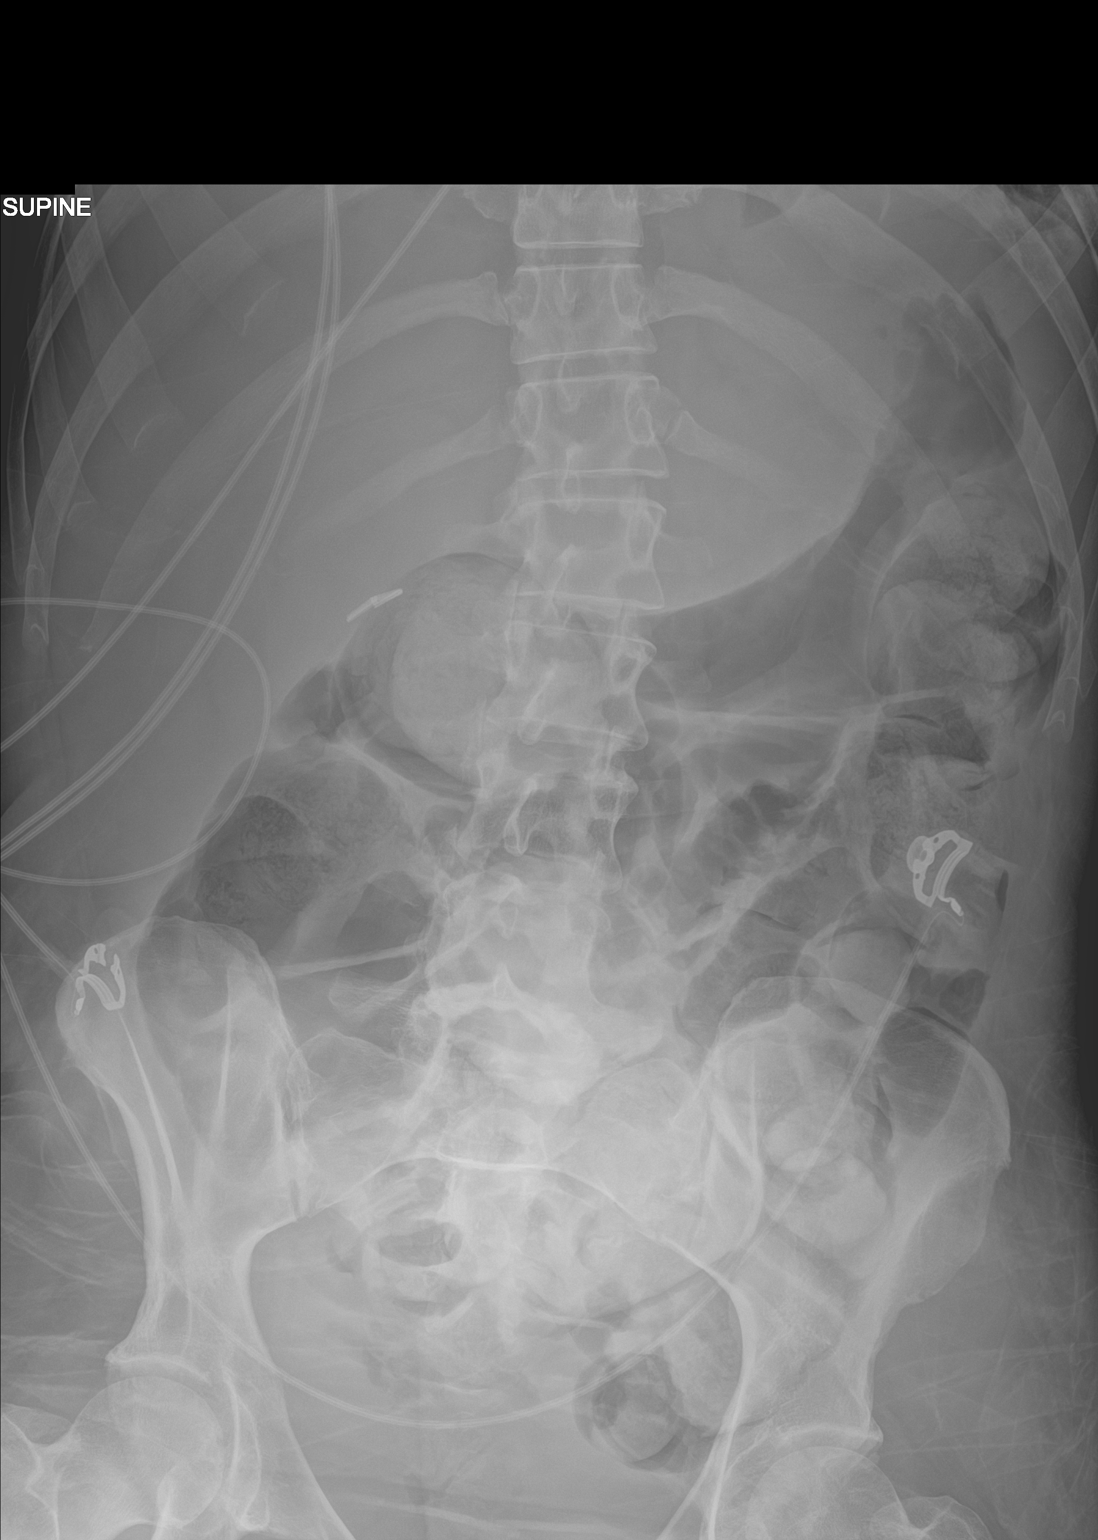

[1 of 1 positions shown; findings below may reference images not displayed]

FINDINGS: Diffuse gaseous distention of bowel. Moderate stool throughout the
colon. Prior cholecystectomy. Liver appears prominent, question
hepatomegaly. No free air or suspicious calcification.
IMPRESSION: Diffuse gaseous distention of bowel may reflect mild ileus. Moderate
stool burden.

Liver appears prominent, question hepatomegaly. Recommend clinical
correlation.

Prior cholecystectomy.

No evidence of free air or bowel obstruction.

## 2019-06-16 MED ORDER — LACTATED RINGERS IV BOLUS
1000.0000 mL | Freq: Once | INTRAVENOUS | Status: AC
  Administered 2019-06-16: 15:00:00 1000 mL via INTRAVENOUS

## 2019-06-16 MED ORDER — LACTATED RINGERS IV SOLN
INTRAVENOUS | Status: DC
  Administered 2019-06-16 – 2019-06-18 (×4): via INTRAVENOUS

## 2019-06-16 NOTE — Progress Notes (Addendum)
[facility]  Date of Admission:  06/14/2019      Total days of antibiotics 3  Vancomycin day 3           Patient ID: Monica Stephens is a 38 y.o. female with  Principal Problem:   MRSA bacteremia Active Problems:   Septic pulmonary embolism, bilateral    Suspected endocarditis   Active intravenous drug use   Opioid overdose (HCC)   Severe opioid dependence (HCC)   Pericardial effusion   . busPIRone  10 mg Oral BID  . heparin  5,000 Units Subcutaneous Q8H  . levothyroxine  150 mcg Oral Once per day on Sun Tue Wed Thu Fri Sat  . levothyroxine  225 mcg Oral Q Mon  . methadone  20 mg Oral BID    SUBJECTIVE: "I hurt all over. Can't lift my arms or legs." Able to use the bathroom and pass urine normally. Has abdominal pain and distension - has not had a BM in several days. She is having a hard time lifting her legs due to pain. She cannot tell me as to where specifically it resides or stems from but feels that this is related to hips. She has diffuse and wide spread tenderness without any one spot predominating.   ER visit for accidental OD 6/6 - CXR at this time w/o any findings concerning for pneumomediastinum, pneumothorax or fracture to sternum at that visit.   Regarding her rash she states this started as a "big lump" on the chest that went away and has been present for "at least a week."   Review of Systems: Review of Systems  Constitutional: Negative for fever.  Eyes: Negative for blurred vision.  Respiratory: Positive for shortness of breath. Negative for cough.   Cardiovascular: Positive for chest pain. Negative for leg swelling.  Gastrointestinal: Positive for abdominal pain and constipation.  Skin: Positive for rash. Negative for itching.    No Known Allergies  OBJECTIVE: Vitals:   06/16/19 0217 06/16/19 0417 06/16/19 0617 06/16/19 0630  BP: 103/66 (!) 95/58 96/63   Pulse: 98 95 95   Resp:      Temp:    98.4 F (36.9 C)   TempSrc:    Oral  SpO2: 96% 98% 96%   Weight:      Height:       Body mass index is 21.53 kg/m.  Physical Exam Constitutional:      Appearance: She is ill-appearing.     Comments: Resting in bed  HENT:     Mouth/Throat:     Mouth: Mucous membranes are moist.     Pharynx: No oropharyngeal exudate.  Eyes:     General: No scleral icterus. Neck:     Musculoskeletal: Normal range of motion.  Cardiovascular:     Rate and Rhythm: Normal rate and regular rhythm.     Pulses: Normal pulses.  Abdominal:     General: There is distension.     Tenderness: There is abdominal tenderness.  Musculoskeletal:        General: Tenderness (all over) present.     Comments: Difficult lifting right and left leg to gravity but slowly able to perform task and flex at the knees/hips.   Skin:    General: Skin is warm and dry.     Capillary Refill: Capillary refill takes less than 2 seconds.     Comments: Rash to chest unchanged.   Neurological:  Mental Status: She is alert and oriented to person, place, and time.     Lab Results Lab Results  Component Value Date   WBC 9.6 06/15/2019   HGB 9.2 (L) 06/15/2019   HCT 26.1 (L) 06/15/2019   MCV 83.1 06/15/2019   PLT 194 06/15/2019    Lab Results  Component Value Date   CREATININE 1.43 (H) 06/16/2019   BUN 40 (H) 06/16/2019   NA 137 06/16/2019   K 3.9 06/16/2019   CL 102 06/16/2019   CO2 23 06/16/2019    Lab Results  Component Value Date   ALT 26 06/15/2019   AST 62 (H) 06/15/2019   ALKPHOS 182 (H) 06/15/2019   BILITOT 1.3 (H) 06/15/2019     Microbiology: Recent Results (from the past 240 hour(s))  Culture, blood (Routine x 2)     Status: Abnormal   Collection Time: 06/14/19 10:45 AM   Specimen: BLOOD RIGHT HAND  Result Value Ref Range Status   Specimen Description BLOOD RIGHT HAND  Final   Special Requests   Final    BOTTLES DRAWN AEROBIC ONLY Blood Culture adequate volume   Culture  Setup Time   Final    GRAM POSITIVE COCCI  AEROBIC BOTTLE ONLY CRITICAL RESULT CALLED TO, READ BACK BY AND VERIFIED WITH: PHRMD L SEAY @0136  06/15/19 BY S GEZAHEGN Performed at [facility], [address], [place], [place] [address]    Culture METHICILLIN RESISTANT STAPHYLOCOCCUS AUREUS (A)  Final   Report Status 06/16/2019 FINAL  Final   Organism ID, Bacteria METHICILLIN RESISTANT STAPHYLOCOCCUS AUREUS  Final      Susceptibility   Methicillin resistant staphylococcus aureus - MIC*    CIPROFLOXACIN >=8 RESISTANT Resistant     ERYTHROMYCIN >=8 RESISTANT Resistant     GENTAMICIN <=0.5 SENSITIVE Sensitive     OXACILLIN >=4 RESISTANT Resistant     TETRACYCLINE <=1 SENSITIVE Sensitive     VANCOMYCIN 1 SENSITIVE Sensitive     TRIMETH/SULFA <=10 SENSITIVE Sensitive     CLINDAMYCIN <=0.25 SENSITIVE Sensitive     RIFAMPIN <=0.5 SENSITIVE Sensitive     Inducible Clindamycin NEGATIVE Sensitive     * METHICILLIN RESISTANT STAPHYLOCOCCUS AUREUS  Blood Culture ID Panel (Reflexed)     Status: Abnormal   Collection Time: 06/14/19 10:45 AM  Result Value Ref Range Status   Enterococcus species NOT DETECTED NOT DETECTED Final   Listeria monocytogenes NOT DETECTED NOT DETECTED Final   Staphylococcus species DETECTED (A) NOT DETECTED Final    Comment: CRITICAL RESULT CALLED TO, READ BACK BY AND VERIFIED WITH: PHRMD L SEAY @0136  06/15/19 BY S GEZAHEGN    Staphylococcus aureus (BCID) DETECTED (A) NOT DETECTED Final    Comment: Methicillin (oxacillin)-resistant Staphylococcus aureus (MRSA). MRSA is predictably resistant to beta-lactam antibiotics (except ceftaroline). Preferred therapy is vancomycin unless clinically contraindicated. Patient requires contact precautions if  hospitalized. CRITICAL RESULT CALLED TO, READ BACK BY AND VERIFIED WITH: PHRMD L SEAY @0136  06/15/19 BY S GEZAHEGN    Methicillin resistance DETECTED (A) NOT DETECTED Final    Comment: CRITICAL RESULT CALLED TO, READ BACK BY AND VERIFIED WITH: PHRMD L SEAY @0136  06/15/19  BY S GEZAHEGN    Streptococcus species NOT DETECTED NOT DETECTED Final   Streptococcus agalactiae NOT DETECTED NOT DETECTED Final   Streptococcus pneumoniae NOT DETECTED NOT DETECTED Final   Streptococcus pyogenes NOT DETECTED NOT DETECTED Final   Acinetobacter baumannii NOT DETECTED NOT DETECTED Final   Enterobacteriaceae species NOT DETECTED NOT DETECTED Final  Enterobacter cloacae complex NOT DETECTED NOT DETECTED Final   Escherichia coli NOT DETECTED NOT DETECTED Final   Klebsiella oxytoca NOT DETECTED NOT DETECTED Final   Klebsiella pneumoniae NOT DETECTED NOT DETECTED Final   Proteus species NOT DETECTED NOT DETECTED Final   Serratia marcescens NOT DETECTED NOT DETECTED Final   Haemophilus influenzae NOT DETECTED NOT DETECTED Final   Neisseria meningitidis NOT DETECTED NOT DETECTED Final   Pseudomonas aeruginosa NOT DETECTED NOT DETECTED Final   Candida albicans NOT DETECTED NOT DETECTED Final   Candida glabrata NOT DETECTED NOT DETECTED Final   Candida krusei NOT DETECTED NOT DETECTED Final   Candida parapsilosis NOT DETECTED NOT DETECTED Final   Candida tropicalis NOT DETECTED NOT DETECTED Final    Comment: Performed at [facility], [place] [address], [place], [place] [address]  Culture, blood (Routine x 2)     Status: Abnormal   Collection Time: 06/14/19 10:55 AM   Specimen: BLOOD  Result Value Ref Range Status   Specimen Description BLOOD SITE NOT SPECIFIED  Final   Special Requests   Final    BOTTLES DRAWN AEROBIC AND ANAEROBIC Blood Culture adequate volume   Culture  Setup Time   Final    GRAM POSITIVE COCCI IN BOTH AEROBIC AND ANAEROBIC BOTTLES CRITICAL VALUE NOTED.  VALUE IS CONSISTENT WITH PREVIOUSLY REPORTED AND CALLED VALUE.    Culture (A)  Final    STAPHYLOCOCCUS AUREUS SUSCEPTIBILITIES PERFORMED ON PREVIOUS CULTURE WITHIN THE LAST 5 DAYS. Performed at [facility], [place] [address] [place], [place] [address]    Report Status 06/16/2019 FINAL  Final   SARS Coronavirus 2     Status: None   Collection Time: 06/14/19 12:34 PM  Result Value Ref Range Status   SARS Coronavirus 2 NOT DETECTED NOT DETECTED Final    Comment: (NOTE) SARS-CoV-2 target nucleic acids are NOT DETECTED. The SARS-CoV-2 RNA is generally detectable in upper and lower respiratory specimens during the acute phase of infection.  Negative  results do not preclude SARS-CoV-2 infection, do not rule out co-infections with other pathogens, and should not be used as the sole basis for treatment or other patient management decisions.  Negative results must be combined with clinical observations, patient history, and epidemiological information. The expected result is Not Detected. Fact Sheet for Patients: http://www.biofiredefense.com/wp-content/uploads/2020/03/BIOFIRE-COVID -19-patients.pdf Fact Sheet for Healthcare Providers: http://www.biofiredefense.com/wp-content/uploads/2020/03/BIOFIRE-COVID -19-hcp.pdf This test is not yet approved or cleared by the [place] and  has been authorized for detection and/or diagnosis of SARS-CoV-2 by FDA under an Emergency Use Authorization (EUA).  This EUA will remain in effec t (meaning this test can be used) for the duration of  the COVID-19 declaration under Section 564(b)(1) of the Act, 21 U.S.C. section 360bbb-3(b)(1), unless the authorization is terminated or revoked sooner. Performed at [facility], [place] [address], [place], [place] [address]   Novel Coronavirus, NAA (hospital order; send-out to ref lab)     Status: None   Collection Time: 06/14/19  6:37 PM   Specimen: Nasopharyngeal Swab; Respiratory  Result Value Ref Range Status   SARS-CoV-2, NAA NOT DETECTED NOT DETECTED Final    Comment: (NOTE) Testing was performed using the cobas(R) SARS-CoV-2 test. This test was developed and its performance characteristics determined by [employer]. This test has not been FDA cleared or approved. This  test has been authorized by FDA under an Emergency Use Authorization (EUA). This test is only authorized for the duration of time the declaration that  circumstances exist justifying the authorization of the emergency use of in vitro diagnostic tests for detection of SARS-CoV-2 virus and/or diagnosis of COVID-19 infection under section 564(b)(1) of the Act, 21 U.S.C. 924QAS-3(M)(1), unless the authorization is terminated or revoked sooner. When diagnostic testing is negative, the possibility of a false negative result should be considered in the context of a patient's recent exposures and the presence of clinical signs and symptoms consistent with COVID-19. An individual without symptoms of COVID-19 and who is not shedding SARS-CoV-2 virus would expect to have  a negative (not detected) result in this assay. Performed At: [facility] [address] [place], [place] [id number] Rush Farmer MD XQ:[id number]    Riley  Final    Comment: Performed at [facility], [place] [address] [place], [place] [address]    ASSESSMENT & PLAN:  1. MRSA Bacteremia = repeated blood cultures drawn and pending. On vancomycin. Likely will need prolonged course given the constellation of findings thus far.  2. Septic Pulmonary Emboli, B/L = highly suspicious for right sided endocarditis   3. Suspected Right-sided Endocarditis = scheduled for TEE to evaluate tomorrow. No peripheral stigmata for left sided endocarditis. With abdominal pain however may benefit from CT of the abdomen to evaluate for any signs of left sided involvement with peripheral embolization (?splenic infarction with LUQ tenderness).   4. Pericardial Effusion = ?infectious vs traumatic (CPR). Echo pending for tomorrow to further evaluate. She is not tachycardic.   5. Opioid Use Disorder, Severe = appreciate the care and attention from primary team to address and prevent withdrawals. It seems that she  has had 2 accidental drug over doses in the last 3 weeks with one requiring CPR efforts from friend. She is very concerned about dying - I informed her that she will continue to be at risk for these life threatening infections with ongoing injection use. Considering her recurrent overdoses recently I am more concerned about her sudden death related to OD should she continue current habits.   6. Lower Extremity Pain = bilateral. If persists would have low threshold to image her lumbar/sacral spine with MRI to ensure no paraspinal abscess or vertebral infection contributing.   7. Rash = will check zoster PCR and conventional wound culture for more definitive understanding.   I called her mother at her request in the room and updated her on her condition from an infectious standpoint. Welcomed and answered all questions.   Janene Madeira, MSN, NP-C [facility] [email or web address] Pager: [phone] Office: [place]: [phone]  06/16/2019  11:50 AM

## 2019-06-16 NOTE — Progress Notes (Addendum)
   Subjective: Monica Stephens is complaining of pain and tenderness on her chest. She says her body hurts. She is worried about what is going on with her.  Objective:  Vital signs in last 24 hours: Vitals:   06/15/19 1100 06/15/19 1400 06/15/19 1500 06/15/19 2017  BP: 110/60  115/67 107/74  Pulse:  (!) 102  96  Resp:      Temp: 97.7 F (36.5 C)  97.9 F (36.6 C) 98 F (36.7 C)  TempSrc: Oral  Oral Oral  SpO2:  100%  100%  Weight:      Height:       Ph/E: General: Patient is uncomfortable due to pain CV: RRR, murmur? Abdomen: Soft, tenderness on abdominal wall around the rash Pulm/chest exam: No respiratory distress on 2 li nasal O2. Diffuse tenderness mostly at right and middle chest wall Skin: Has rash in lower abdomen as well as sternal area (with blister appearance in sternal area) .  Musculoskeletal: No joint effusion  Assessment/Plan:  Principal Problem:   MRSA bacteremia Active Problems:   Opioid overdose ([place])   Septic pulmonary embolism, bilateral    Suspected endocarditis   Active intravenous drug use   Severe opioid dependence ([place])   Pericardial effusion   Monica Stephens is a 38 year old female withpolysubstance abuse including IVheroin, bipolar, history of thyroid cancer who presents with chest pain and dyspneaand found to have bilateral septic pulmonary emboli with severe pulmonary infection and MRSA bacteremia.  MRSA bacteremia, septic emboli with pulmonary opacities Likely endocarditis 3/3 blood cultures MRSA positive. - Narrowed antibiotics to vancomycin only. -Repeat BC today - Follow-up ID recs -Follow-up HIV -Follow-up sputum culture -TEE tomorrow to evaluate for septic endocarditis and valvular abnormalities.  -NPO after midnight -Will hold off on PICC line until repeat BC is negative. Patient will need long-term antibiotics in house  Opioid Use Disorder: Switched fentanyl patch to methadone 20 mg BID and added Dilaudid 0.5 mg every 6 hours as  needed breakthrough pain. She received 3 dose of Dilauded in past 24 h. EKG today with No QT prolongation on Methadone  -Continue with pain medications: IV Dilaudid q6 h PRN -Methadone 20 mg BID -Buspirone 10 mg BID -Hydroxyzine 25 mg TID PRN  Vesicular rash: Still painful but improved comparing to yesterday. ID was consulted and believed this is less likelyto be varicella-zoster infection and is likely due to MRSA skin breakdown -Continue to monitor, pain medications as above  Acute Kidney Injury: Renal ultrasound unremarkable.  Likely prerenal azotemia. Creatinine improved today to 1.43 with IVF. -Will give an additional 1L IVF following with 100 ml/h  today. -CMP daily  Transaminitis: Elevetaed AST to 62. She did have a positive Hep B surface antigen in 2016 but not other testing. Will plan on screening for Hep B and C given previous results and history of IVDU. - Hep B surface antigen and Hep C antibody pending - CMP daily  FEN/GI: Regular Diet Code status: Full DVT PPX: Heparin   Dispo: Anticipated discharge in approximately depends on wTEE result. Patient will need long-term antibiotics in house  Dewayne Hatch, MD 06/16/2019, 5:24 AM Pager: [phone]

## 2019-06-16 NOTE — Progress Notes (Signed)
    [facility] has been requested to perform a transesophageal echocardiogram on 06/17/19 for Bacteremia.  After careful review of history and examination, the risks and benefits of transesophageal echocardiogram have been explained including risks of esophageal damage, perforation (1:10,000 risk), bleeding, pharyngeal hematoma as well as other potential complications associated with conscious sedation including aspiration, arrhythmia, respiratory failure and death. Alternatives to treatment were discussed, questions were answered. Patient is willing to proceed. Labs stable, vital signs notable for soft blood pressures. Will need to follow up in the morning.    Reino Bellis, NP-C 06/16/2019 4:33 PM

## 2019-06-16 NOTE — Progress Notes (Signed)
  Echocardiogram 2D Echocardiogram has been performed.  Monica Stephens 06/16/2019, 2:25 PM

## 2019-06-17 ENCOUNTER — Inpatient Hospital Stay (HOSPITAL_COMMUNITY): Payer: Self-pay | Admitting: Certified Registered Nurse Anesthetist

## 2019-06-17 ENCOUNTER — Encounter (HOSPITAL_COMMUNITY)
Admission: EM | Disposition: A | Discharge status: Home / Self Care | Payer: Self-pay | Source: Home / Self Care | Attending: Internal Medicine

## 2019-06-17 ENCOUNTER — Encounter (HOSPITAL_COMMUNITY): Payer: Self-pay

## 2019-06-17 ENCOUNTER — Inpatient Hospital Stay (HOSPITAL_COMMUNITY): Payer: Self-pay

## 2019-06-17 ENCOUNTER — Other Ambulatory Visit (HOSPITAL_COMMUNITY): Payer: Self-pay

## 2019-06-17 DIAGNOSIS — K567 Ileus, unspecified: Secondary | ICD-10-CM

## 2019-06-17 DIAGNOSIS — R140 Abdominal distension (gaseous): Secondary | ICD-10-CM

## 2019-06-17 DIAGNOSIS — M545 Low back pain: Secondary | ICD-10-CM

## 2019-06-17 DIAGNOSIS — R7881 Bacteremia: Secondary | ICD-10-CM

## 2019-06-17 DIAGNOSIS — D649 Anemia, unspecified: Secondary | ICD-10-CM

## 2019-06-17 HISTORY — PX: TEE WITHOUT CARDIOVERSION: SHX5443

## 2019-06-17 LAB — CK: Total CK: 72 U/L (ref 38–234)

## 2019-06-17 LAB — URINE CULTURE: Culture: 80000 — AB

## 2019-06-17 LAB — CBC
HCT: 24.3 % — ABNORMAL LOW (ref 36.0–46.0)
Hemoglobin: 8.1 g/dL — ABNORMAL LOW (ref 12.0–15.0)
MCH: 29.2 pg (ref 26.0–34.0)
MCHC: 33.3 g/dL (ref 30.0–36.0)
MCV: 87.7 fL (ref 80.0–100.0)
Platelets: 204 10*3/uL (ref 150–400)
RBC: 2.77 MIL/uL — ABNORMAL LOW (ref 3.87–5.11)
RDW: 15.9 % — ABNORMAL HIGH (ref 11.5–15.5)
WBC: 8.3 10*3/uL (ref 4.0–10.5)
nRBC: 0 % (ref 0.0–0.2)

## 2019-06-17 LAB — BASIC METABOLIC PANEL
Anion gap: 8 (ref 5–15)
BUN: 41 mg/dL — ABNORMAL HIGH (ref 6–20)
CO2: 24 mmol/L (ref 22–32)
Calcium: 8 mg/dL — ABNORMAL LOW (ref 8.9–10.3)
Chloride: 106 mmol/L (ref 98–111)
Creatinine, Ser: 1.39 mg/dL — ABNORMAL HIGH (ref 0.44–1.00)
GFR calc Af Amer: 56 mL/min — ABNORMAL LOW (ref >60)
GFR calc non Af Amer: 49 mL/min — ABNORMAL LOW (ref >60)
Glucose, Bld: 71 mg/dL (ref 70–99)
Potassium: 4.4 mmol/L (ref 3.5–5.1)
Sodium: 138 mmol/L (ref 135–145)

## 2019-06-17 LAB — C-REACTIVE PROTEIN: CRP: 29.5 mg/dL — ABNORMAL HIGH (ref <1.0)

## 2019-06-17 LAB — MAGNESIUM: Magnesium: 2 mg/dL (ref 1.7–2.4)

## 2019-06-17 IMAGING — MR MRI THORACIC SPINE WITHOUT AND WITH CONTRAST
9 of 17 series · 24 of 48 positions shown · IV contrast (gadavist)
Comparison: [DATE] CT chest.

CLINICAL DATA: 37 y/o F; history of polysubstance abuse presenting
with chest pain, septic emboli, and MRSA infection.

EXAM:
MRI THORACIC AND LUMBAR SPINE WITHOUT AND WITH CONTRAST
TECHNIQUE: Multiplanar and multiecho pulse sequences of the thoracic and lumbar
spine were obtained without and with intravenous contrast.
CONTRAST:  5 cc Gadavist

[Series 18: T1 · sagittal · 6.0mm · 1.09mm/px · 2 of 9 slices shown (1 of 5)]
[im 1/9]
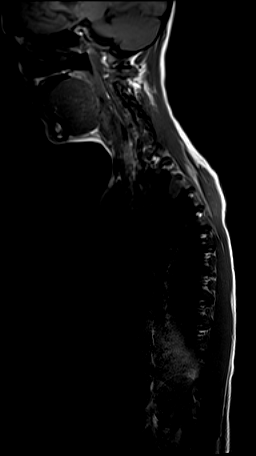
[im 9/9]
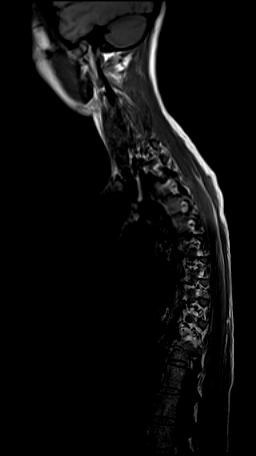

[Series 22: T2 · sagittal · 3.0mm · 0.71mm/px · 2 of 17 slices shown (1 of 4)]
[im 1/17]
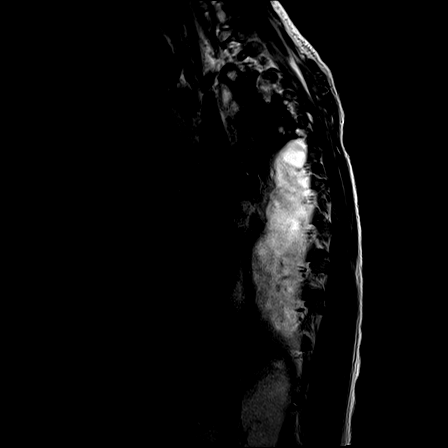
[im 17/17]
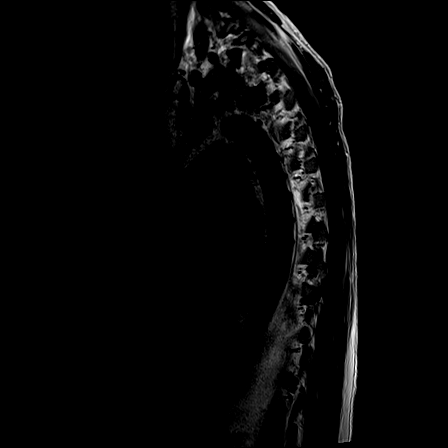

[Series 23: T1 · sagittal · 3.0mm · 0.71mm/px · 2 of 17 slices shown (2 of 5)]
[im 1/17]
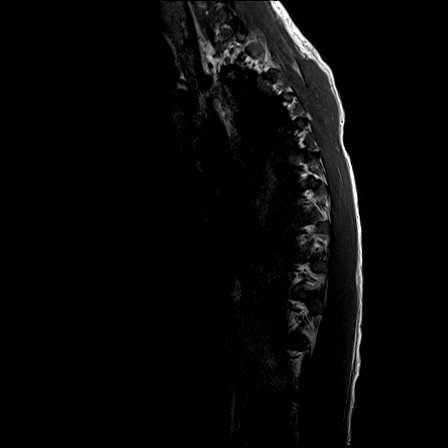
[im 17/17]
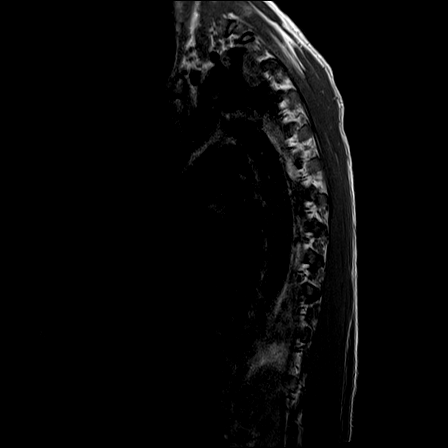

[Series 25: T2 · axial · 4.0mm · 0.59mm/px · z∈[-206,+6]mm · 4 of 36 slices shown (2 of 4)]
[im 1/36]
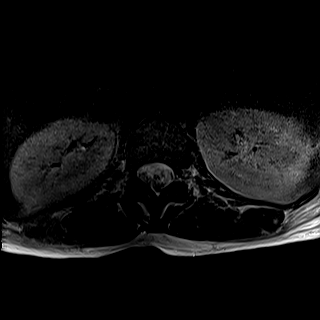
[im 12/36]
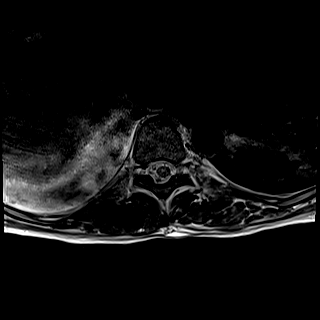
[im 24/36]
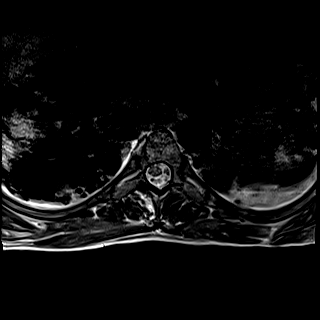
[im 36/36]
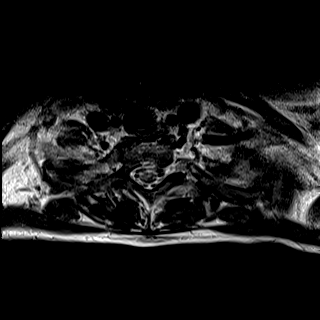

[Series 27: T2 · sagittal · 4.0mm · 0.73mm/px · 2 of 16 slices shown (3 of 4)]
[im 1/16]
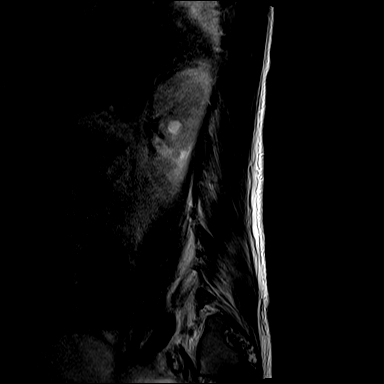
[im 16/16]
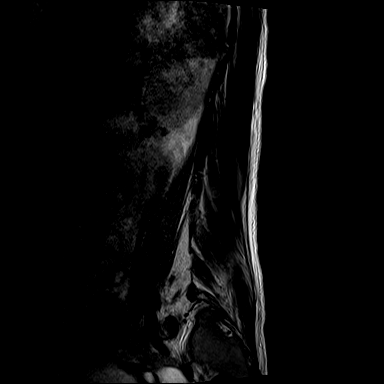

[Series 28: T1 · axial · non-contrast · 4.0mm · 0.31mm/px · z∈[-206,+6]mm · 4 of 36 slices shown (3 of 5)]
[im 1/36]
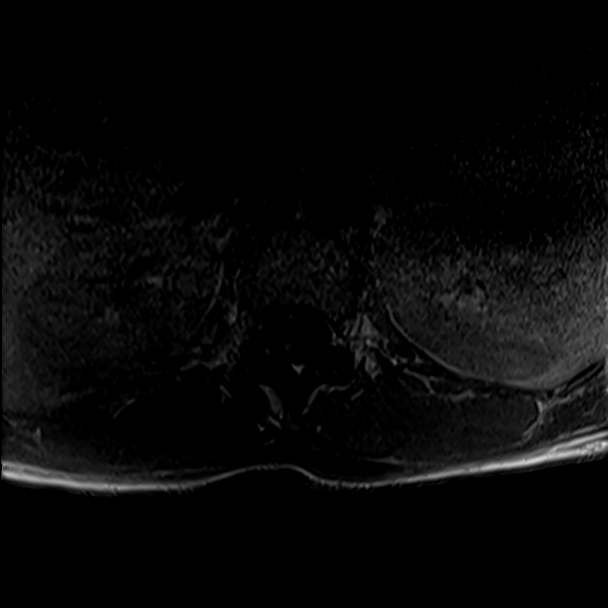
[im 12/36]
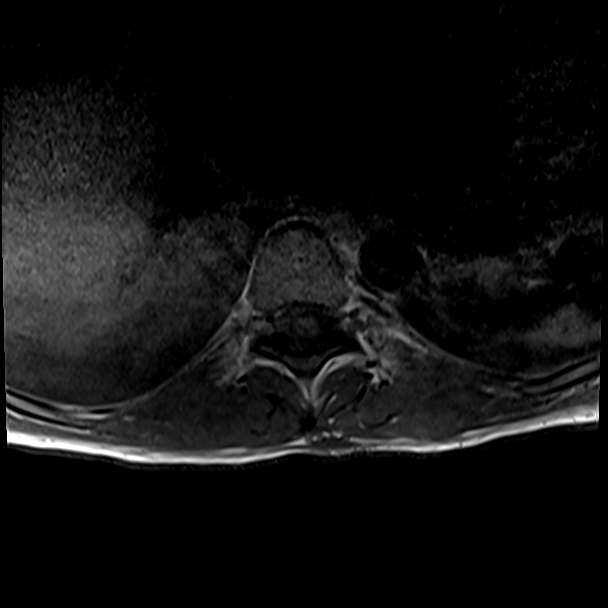
[im 24/36]
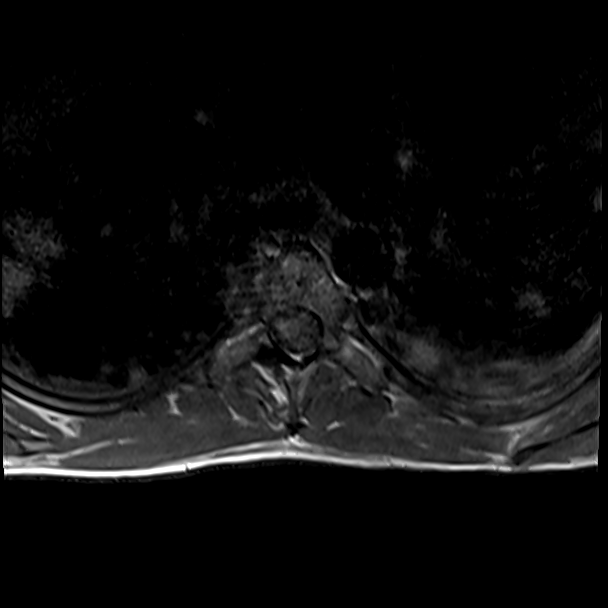
[im 36/36]
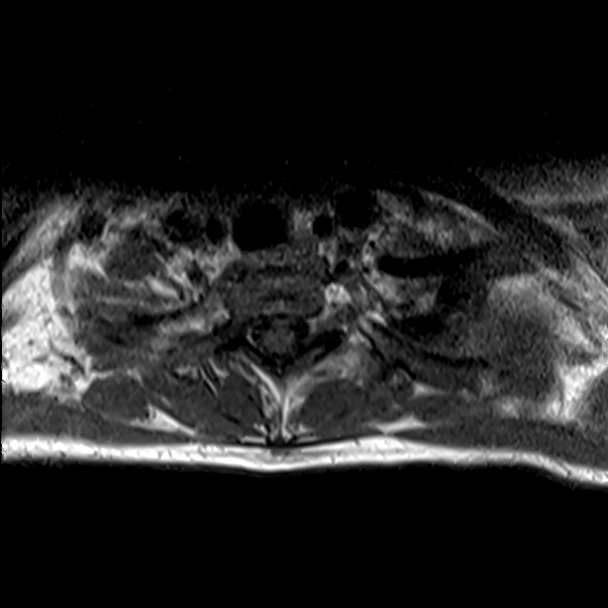

[Series 30: T1 · sagittal · 4.0mm · 0.88mm/px · 2 of 16 slices shown (4 of 5)]
[im 1/16]
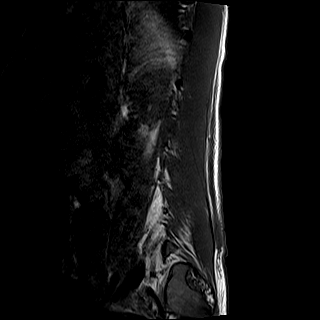
[im 16/16]
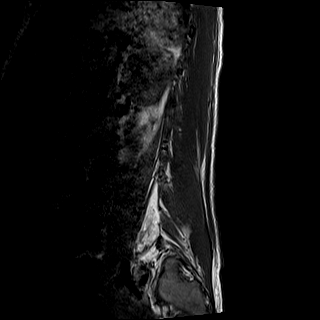

[Series 32: T2 · axial · 4.0mm · 0.57mm/px · z∈[-402,-229]mm · 4 of 30 slices shown (4 of 4)]
[im 1/30]
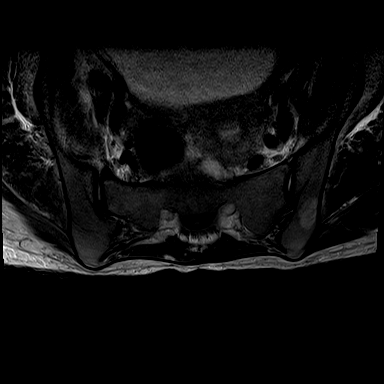
[im 10/30]
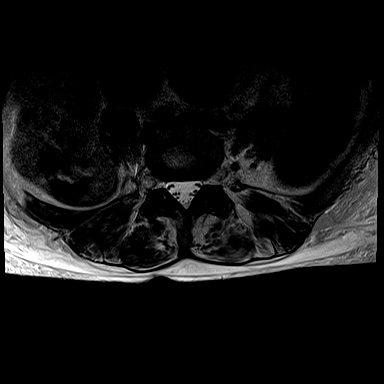
[im 20/30]
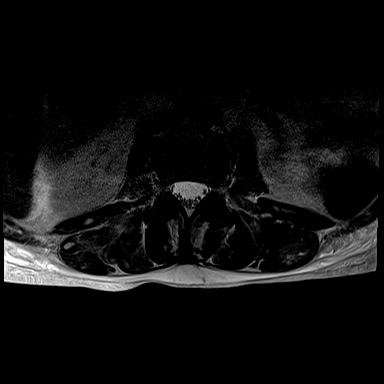
[im 30/30]
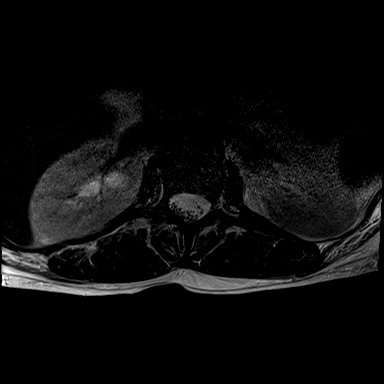

[Series 34: T1 · axial · 4.0mm · 0.34mm/px · z∈[-402,-349]mm · 2 of 30 slices shown (5 of 5)]
[im 1/30]
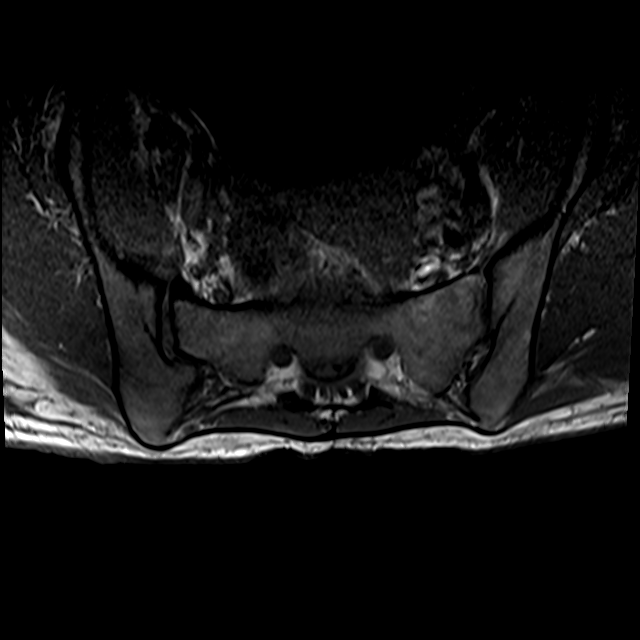
[im 10/30]
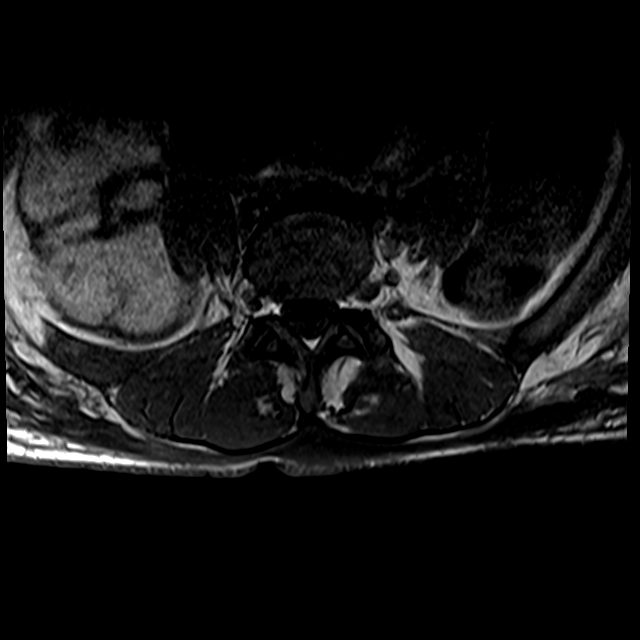

[24 of 48 positions shown; findings below may reference images not displayed]

FINDINGS: MRI THORACIC SPINE FINDINGS

Alignment: Mild reverse S curvature with levo apex at T5 and extra
apex at T10. Normal thoracic kyphosis without listhesis.

Vertebrae: No fracture, evidence of discitis, or bone lesion. After
administration of intravenous contrast there is no abnormal
enhancement.

Cord: Normal signal and morphology. After administration of
intravenous contrast there is no abnormal enhancement.

Paraspinal and other soft tissues: Large loculated right-sided
pleural effusion and small left pleural effusion. Multiple pulmonary
septic emboli and consolidations better characterized on prior CT of
chest. Innumerable small foci of T2 hyperintensity and enhancement
are present within the paraspinal muscles compatible with septic
emboli.

Disc levels:

No significant disc displacement, foraminal stenosis, or canal
stenosis.

MRI LUMBAR SPINE FINDINGS

Segmentation:  Standard.

Alignment:  Physiologic.

Vertebrae: No fracture, evidence of discitis, or bone lesion. After
administration of intravenous contrast there is no abnormal
enhancement.

Conus medullaris: Extends to the T12-L1 level and appears normal.
After administration of intravenous contrast there is no abnormal
enhancement.

Paraspinal and other soft tissues: Innumerable tiny foci of T2
hyperintensity and enhancement, several with a ring appearance, are
present throughout the paraspinal muscles compatible with septic
emboli. The largest within the left iliocostalis muscle at the L2-3
level measures up to 18 mm (series 36, image 10). Partially
visualized is an enhancing focus within the left ilium measuring 19
mm (series 36, image 30)

Disc levels:

No significant disc displacement, foraminal stenosis, or canal
stenosis.
IMPRESSION: 1. No findings of discitis osteomyelitis or abnormal signal of the
thoracic spinal cord, conus medullaris, or cauda equina.
2. Innumerable tiny septic emboli throughout the included paraspinal
muscles. Extensive pulmonary disease better characterized on prior
CT chest.
3. Partially visualized enhancing focus within the left ilium
measuring 19 mm may represent a bony septic embolus.

## 2019-06-17 SURGERY — ECHOCARDIOGRAM, TRANSESOPHAGEAL
Anesthesia: Monitor Anesthesia Care

## 2019-06-17 MED ORDER — LIDOCAINE 2% (20 MG/ML) 5 ML SYRINGE
INTRAMUSCULAR | Status: DC | PRN
  Administered 2019-06-17: 80 mg via INTRAVENOUS

## 2019-06-17 MED ORDER — PROPOFOL 10 MG/ML IV BOLUS
INTRAVENOUS | Status: DC | PRN
  Administered 2019-06-17: 50 mg via INTRAVENOUS

## 2019-06-17 MED ORDER — GADOBUTROL 1 MMOL/ML IV SOLN
5.0000 mL | Freq: Once | INTRAVENOUS | Status: AC | PRN
  Administered 2019-06-17: 5 mL via INTRAVENOUS

## 2019-06-17 MED ORDER — VANCOMYCIN HCL 500 MG IV SOLR
500.0000 mg | Freq: Two times a day (BID) | INTRAVENOUS | Status: DC
  Administered 2019-06-17 – 2019-06-19 (×6): 500 mg via INTRAVENOUS
  Filled 2019-06-17 (×7): qty 500

## 2019-06-17 MED ORDER — PHENYLEPHRINE 40 MCG/ML (10ML) SYRINGE FOR IV PUSH (FOR BLOOD PRESSURE SUPPORT)
PREFILLED_SYRINGE | INTRAVENOUS | Status: DC | PRN
  Administered 2019-06-17: 80 ug via INTRAVENOUS

## 2019-06-17 MED ORDER — PROPOFOL 500 MG/50ML IV EMUL
INTRAVENOUS | Status: DC | PRN
  Administered 2019-06-17: 100 ug/kg/min via INTRAVENOUS

## 2019-06-17 MED ORDER — POLYETHYLENE GLYCOL 3350 17 G PO PACK
17.0000 g | PACK | Freq: Every day | ORAL | Status: DC
  Administered 2019-06-17 – 2019-06-29 (×4): 17 g via ORAL
  Filled 2019-06-17 (×10): qty 1

## 2019-06-17 NOTE — Anesthesia Preprocedure Evaluation (Signed)
Anesthesia Evaluation  Patient identified by MRN, date of birth, ID band Patient awake    Reviewed: Allergy & Precautions, NPO status , Patient's Chart, lab work & pertinent test results  Airway Mallampati: III  TM Distance: >3 FB Neck ROM: Full    Dental no notable dental hx. (+) Teeth Intact   Pulmonary Current Smoker,    Pulmonary exam normal breath sounds clear to auscultation       Cardiovascular negative cardio ROS Normal cardiovascular exam Rhythm:Regular Rate:Normal     Neuro/Psych PSYCHIATRIC DISORDERS Anxiety Depression Bipolar Disorder negative neurological ROS     GI/Hepatic Neg liver ROS, GERD  ,  Endo/Other  Hypothyroidism Hx/o Thyroid Ca S/P RAI treatment  Renal/GU negative Renal ROS     Musculoskeletal negative musculoskeletal ROS (+)   Abdominal (+) - obese,   Peds  Hematology  (+) anemia ,   Anesthesia Other Findings Bacteremia IV Drug use  Reproductive/Obstetrics                             Anesthesia Physical  Anesthesia Plan  ASA: III  Anesthesia Plan: MAC   Post-op Pain Management:    Induction: Intravenous  PONV Risk Score and Plan: 1 and Treatment may vary due to age or medical condition and Ondansetron  Airway Management Planned: Nasal Cannula  Additional Equipment:   Intra-op Plan:   Post-operative Plan:   Informed Consent: I have reviewed the patients History and Physical, chart, labs and discussed the procedure including the risks, benefits and alternatives for the proposed anesthesia with the patient or authorized representative who has indicated his/her understanding and acceptance.     Dental advisory given  Plan Discussed with: Anesthesiologist  Anesthesia Plan Comments:         Anesthesia Quick Evaluation

## 2019-06-17 NOTE — Progress Notes (Signed)
Pharmacy Antibiotic Note  Monica Stephens is a 38 y.o. female admitted on 06/14/2019 with MRSA bacteremia.  Pharmacy has been consulted for vancomycin dosing. Pt has been on 750 mg every 24 hours since 6/15. SCr continues to improve - 1.39 today, down from 1.77 on 6/15. Estimated CrCl 44.3. Getting TEE today and will follow for length of therapy.  Plan: Vancomycin IV 500 mg every 12 hours Hold off on AUC dosing while in AKI Monitor daily SCr, cultures, Echo, and clinical improvement.   Height: 5\' 2"  (157.5 cm) Weight: 117 lb 11.6 oz (53.4 kg) IBW/kg (Calculated) : 50.1  Temp (24hrs), Avg:98 F (36.7 C), Min:97.6 F (36.4 C), Max:98.4 F (36.9 C)  Recent Labs  Lab 06/14/19 1036 06/14/19 1231 06/14/19 1833 06/15/19 0449 06/16/19 0433 06/17/19 0430  WBC 14.5*  --  9.3 9.6  --  8.3  CREATININE 2.84*  --   --  1.77* 1.43* 1.39*  LATICACIDVEN 2.6* 1.8  --   --   --   --     Estimated Creatinine Clearance: 44.3 mL/min (A) (by C-G formula based on SCr of 1.39 mg/dL (H)).    No Known Allergies   Thank you for allowing pharmacy to be a part of this patient's care.  Vallery Sa  PharmD Candidate 06/17/2019 9:12 AM

## 2019-06-17 NOTE — Progress Notes (Addendum)
Subjective:   Patient is sitting up and appears better today. She states that she has pain in her back. Has been having difficulty moving. She states that she is "hurting everywhere".   Objective:  Vital signs in last 24 hours: Vitals:   06/17/19 0935 06/17/19 0942 06/17/19 1016 06/17/19 1412  BP: (!) 90/43 (!) 93/47 (!) 95/57   Pulse: (!) 103 (!) 106 99   Resp: 13 19  18   Temp:    98.3 F (36.8 C)  TempSrc:    Oral  SpO2: 94% 95% 93%   Weight:      Height:       General: Patient is sitting in the bed in no acute distress CV: RRR, murmur? Abdomen: Soft, tenderness on abdominal wall around the rash Pulm/chest exam: No respiratory distress. Skin: Has rash in lower abdomen as well as sternal area (with blister  in sternal area) Neurologic exam: A & O x 3 Musculoskeletal: Generalized tenderness. No focal spine tenderness. No join tenderness   Assessment/Plan:  Principal Problem:   MRSA bacteremia Active Problems:   Opioid overdose ([place])   Septic pulmonary embolism, bilateral    Suspected endocarditis   Active intravenous drug use   Severe opioid dependence ([place])   Pericardial effusion   Rash/skin eruption   Monica Stephens is a 38 year old female withpolysubstance abuse including IVheroin, bipolar, history of thyroid cancer who presents with chest pain and dyspneaand found to have bilateral septic pulmonary emboliwith severe pulmonary infection and MRSA bacteremia.  MRSA bacteremia, septic emboli with pulmonary opacities Likely endocarditis [facility] 6/14  MRSA positive BC 6/15 no growth 2 days [facility] 6/17 pending  Clinically improved today. Has less pain on the area around the rash but complains of back pain and generalized pain. No localized pain on spinal area but we have low threshold for imaging to r/u metastatic infection.  Fortunately TEE negative for active vegetation or valvular abnormality.  -HIV negative. Heb C Ab and Hbs Ag negative. -Ct with vancomycin (Started  on 6/14. will need 6 weeks of AB) -F/u Repeat BC  -ID is following. Appreciate recomendations -TEE did not show vegetation or valvular abnormalities. -Will hold off on PICC line until repeat BC is negative.Patient will need long-term antibiotics in house  Acute normocytic anemia: Hb trended down to 8.1 today. No evidence of active bleeding. Can be dilutional due to IV Fluid? Plt normal and less likely to be DIC. Will do PBS and check Bili tomorrow.  -PBS -LFT tomorrow for bili -CBC tomorrow  Opioid Use Disorder: Constipation and abdominal distention: Mild ileus and moderate stool burden on abdominal Xray  She received 4 dose of Dilauded in past 24 h.  -Will give bowel regimen for constipation due to opioid pain meds -Continue with pain medications: IV Dilaudid q6 h PRN -Ct Methadone 20 mg BID will start switching to suboxone 6/18 -Ct Buspirone 10 mg BID for anxiety  -Ct Hydroxyzine 25 mg TID PRN for anxiety -PT/Ot eval and treat (For note, PT found an unattached tick on patient's shoulder and removed that) -CK -Lumbar and thoracic MRI w and w/o contrast (I talked to MRI staff and her GFR is ok for contrast)  Vesicular rash:Culture with WBC and rare gr positive cocci. Likley due to MRSA bacterima. -Varicella PCR is pending -Continue to monitor, pain medications as above  Acute Kidney Injury:Slow improvement with IV fluid Renal ultrasound unremarkable.Likely prerenal azotemia. -Will give additional IVF -CMP daily  Dispo: Anticipated discharge in approximately 6 weeks  Monica Hatch, MD 06/17/2019, 2:44 PM Pager: [phone]

## 2019-06-17 NOTE — Progress Notes (Signed)
[facility] for Infectious Disease  Date of Admission:  06/14/2019      Total days of antibiotics 4  Vancomycin day 4          Patient ID: Monica Stephens is a 38 y.o. female with  Principal Problem:   MRSA bacteremia Active Problems:   Septic pulmonary embolism, bilateral    Suspected endocarditis   Active intravenous drug use   Opioid overdose (HCC)   Severe opioid dependence (H[facility])   Pericardial effusion   Rash/skin eruption   . busPIRone  10 mg Oral BID  . heparin  5,000 Units Subcutaneous Q8H  . levothyroxine  150 mcg Oral Once per day on Sun Tue Wed Thu Fri Sat  . levothyroxine  225 mcg Oral Q Mon  . methadone  20 mg Oral BID    SUBJECTIVE: Just back from TEE. Sleepy but still anxious and finding it difficult to get comfortable. Ongoing sacral/lower back pain and difficulty lifting legs. Arms and shoulders are sore as well with trying to move in the setting of a known sternal fracture.   Review of Systems: Review of Systems  Constitutional: Negative for fever.  Eyes: Negative for blurred vision.  Respiratory: Negative for cough and shortness of breath.   Cardiovascular: Positive for chest pain. Negative for leg swelling.  Gastrointestinal: Positive for abdominal pain and constipation.  Genitourinary: Negative for dysuria.  Musculoskeletal: Positive for back pain.  Skin: Positive for rash. Negative for itching.  Neurological: Negative for tingling and headaches.    No Known Allergies  OBJECTIVE: Vitals:   06/17/19 0931 06/17/19 0935 06/17/19 0942 06/17/19 1016  BP: (!) 97/54 (!) 90/43 (!) 93/47 (!) 95/57  Pulse: (!) 105 (!) 103 (!) 106 99  Resp: 13 13 19    Temp:      TempSrc:      SpO2: 92% 94% 95% 93%  Weight:      Height:       Body mass index is 21.53 kg/m.  Physical Exam Constitutional:      Appearance: She is ill-appearing.     Comments: Resting in bed  HENT:     Mouth/Throat:     Mouth: Mucous membranes are moist.   Pharynx: No oropharyngeal exudate.  Eyes:     General: No scleral icterus. Neck:     Musculoskeletal: Normal range of motion.  Cardiovascular:     Rate and Rhythm: Normal rate and regular rhythm.     Pulses: Normal pulses.  Abdominal:     General: There is distension.     Tenderness: There is abdominal tenderness.  Musculoskeletal:        General: Tenderness (all over) present.     Comments: Difficult lifting right and left leg to gravity but slowly able to perform task and flex at the knees/hips.   Skin:    General: Skin is warm and dry.     Capillary Refill: Capillary refill takes less than 2 seconds.     Comments: Rash to chest with some improved/receeding erythema compared to initial presentation.   Neurological:     Mental Status: She is alert and oriented to person, place, and time.     Lab Results Lab Results  Component Value Date   WBC 8.3 06/17/2019   HGB 8.1 (L) 06/17/2019   HCT 24.3 (L) 06/17/2019   MCV 87.7 06/17/2019   PLT 204 06/17/2019    Lab Results  Component Value Date  CREATININE 1.39 (H) 06/17/2019   BUN 41 (H) 06/17/2019   NA 138 06/17/2019   K 4.4 06/17/2019   CL 106 06/17/2019   CO2 24 06/17/2019    Lab Results  Component Value Date   ALT 26 06/15/2019   AST 62 (H) 06/15/2019   ALKPHOS 182 (H) 06/15/2019   BILITOT 1.3 (H) 06/15/2019     Microbiology: 6/14 BCx >> MRSA + 2/2 sets 6/15 BCx >> NGTD x 2d  6/17 BCx >> NGTD 6/17 Chest culture > few staph aureus  6/17 Chest zoster PCR > pending   ASSESSMENT & PLAN:  1. MRSA Bacteremia = repeated blood cultures drawn and no growth x 2d. On vancomycin. TEE negative. Awaiting back MRI to ensure no discitis/vertebral abscess to help with duration of therapy. BP is a bit borderline today in low 90s/40s.   2. Septic Pulmonary Emboli, B/L = highly suspicious for right sided endocarditis   3. Suspected Right-sided Endocarditis = TEE without signs of left or right sided endocarditis. I suspect she  has a vegetation at some point on the tricuspid valve that embolized to the lungs. Fortunately she has no remaining vegetation or valvular dysfunction and will not likely require surgical intervention.    4. Pericardial Effusion = trivial on echo w/o any pericardial thickening to suggest pericarditis.    5. Opioid Use Disorder, Severe = appreciate the care and attention from primary team to address and prevent withdrawals. Seems to be doing better.   6. Lower Extremity Pain = persistent complaint for her. Would recommend lumbosacral MRI with and without contrast to evaluate for any signs of metastatic infection as she seems to be having a difficult time moving and raising legs against gravity.   7. Rash = light growth staph aureus identified, which is not a surprise given milky appearance of fluid but also could reflect skin colonizer. Will await viral PCR.    8. Abdominal Distension = at least no worse today. XRay indicating moderate stool burden and gaseous distension. May need a bowel protocol.    Janene Madeira, MSN, NP-C [facility] for Infectious Disease [email or web address] Pager: [phone] Office: [phone] [place]: [phone]   06/17/2019  1:51 PM

## 2019-06-17 NOTE — Transfer of Care (Signed)
Immediate Anesthesia Transfer of Care Note  Patient: Monica Stephens  Procedure(s) Performed: TRANSESOPHAGEAL ECHOCARDIOGRAM (TEE) (N/A )  Patient Location: Endoscopy Unit  Anesthesia Type:MAC  Level of Consciousness: drowsy  Airway & Oxygen Therapy: Patient Spontanous Breathing and Patient connected to nasal cannula oxygen  Post-op Assessment: Report given to RN and Post -op Vital signs reviewed and stable  Post vital signs: Reviewed and stable  Last Vitals:  Vitals Value Taken Time  BP 88/41   Temp    Pulse 105   Resp 16   SpO2 95%     Last Pain:  Vitals:   06/17/19 0816  TempSrc: Axillary  PainSc: 8       Patients Stated Pain Goal: 0 (03/52/48 1859)  Complications: No apparent anesthesia complications

## 2019-06-17 NOTE — H&P (Signed)
   INTERVAL PROCEDURE H&P  History and Physical Interval Note:  06/17/2019 8:34 AM  Monica Stephens has presented today for their planned procedure. The various methods of treatment have been discussed with the patient and family. After consideration of risks, benefits and other options for treatment, the patient has consented to the procedure.  The patients' outpatient history has been reviewed, patient examined, and no change in status from most recent office note within the past 30 days. I have reviewed the patients' chart and labs and will proceed as planned. Questions were answered to the patient's satisfaction.   Monica Casino, MD, [facility], [place] Director of the Advanced Lipid Disorders &  Cardiovascular Risk Reduction Clinic Diplomate of the American Board of Clinical Lipidology Attending Cardiologist  Direct Dial: [phone]  Fax: [phone]  Website:  [name] 06/17/2019, 8:34 AM

## 2019-06-17 NOTE — Evaluation (Signed)
Physical Therapy Evaluation Patient Details Name: Monica Stephens MRN: [id number] DOB: December 16, 1981 Today's Date: 06/17/2019   History of Present Illness  38 year old female with polysubstance abuse including IV heroin, bipolar, history of thyroid cancer who presents with chest pain and dyspnea and found to have bilateral septic pulmonary emboli with severe pulmonary infection and MRSA bacteremia.   Clinical Impression  PTA pt living with boyfriend in apartment, but have to move out by 6/18. Pt reports 2 weeks ago she was ambulating independently without AD and was independent in iADLs. Pt currently limited in safe mobility by increased pain with muscle activation. Pt has less pain with PROM/AAROM. PT educated pt on need for movement and gave exercise program of ankle pumps and finger and wrist flex/extension. During evaluation tick crawled across pt shoulder. RN notified. PT recommending SNF level rehab at d/c to improve mobility. PT will continue to follow acutely.     Follow Up Recommendations SNF    Equipment Recommendations  Other (comment)(TBD at next venue)    Recommendations for Other Services OT consult     Precautions / Restrictions Precautions Precautions: None Restrictions Weight Bearing Restrictions: No      Mobility  Bed Mobility               General bed mobility comments: unable to attempt due to pain               Pertinent Vitals/Pain Pain Assessment: 0-10 Pain Score: 10-Worst pain ever Pain Location: LE>UE, sacrum, and back  Pain Descriptors / Indicators: Stabbing;Sharp;Sore Pain Intervention(s): Limited activity within patient's tolerance;Monitored during session;Repositioned    Home Living Family/patient expects to be discharged to:: Unsure                 Additional Comments: Pt living in apartment but need to vacate by 6/18    Prior Function Level of Independence: Independent         Comments: 2 weeks ago pt ambulating without  AD community levels         Extremity/Trunk Assessment   Upper Extremity Assessment Upper Extremity Assessment: RUE deficits/detail;LUE deficits/detail;Generalized weakness RUE Deficits / Details: AROM limited by pain, AAROM shoulders to 90 degree flexion, elbow and wrist ROM WFL, strength due to pain grossly 2/5 RUE: Unable to fully assess due to pain RUE Coordination: decreased fine motor;decreased gross motor LUE Deficits / Details: AROM limited by pain, AAROM shoulders to 90 degree flexion, elbow and wrist ROM WFL, strength due to pain grossly 2/5 LUE: Unable to fully assess due to pain LUE Coordination: decreased fine motor;decreased gross motor    Lower Extremity Assessment Lower Extremity Assessment: RLE deficits/detail;LLE deficits/detail RLE Deficits / Details: AAROM WFL, AROM too painful to perform, strength grossly 2/5 due to pain  RLE: Unable to fully assess due to pain RLE Coordination: decreased fine motor;decreased gross motor LLE Deficits / Details: AAROM WFL, AROM too painful to perform, strength grossly 2/5 due to pain  LLE: Unable to fully assess due to pain LLE Coordination: decreased fine motor;decreased gross motor       Communication   Communication: No difficulties  Cognition Arousal/Alertness: Awake/alert Behavior During Therapy: Flat affect Overall Cognitive Status: Impaired/Different from baseline Area of Impairment: Problem solving;Awareness                           Awareness: Emergent Problem Solving: Slow processing;Requires verbal cues;Requires tactile cues        General  Comments General comments (skin integrity, edema, etc.): HR elevated to 118 bpm with attempt at AROM of LE, During evaluation of UE a tick crawled across pt shoulder, tick caught and placed in specimen jar, RN made aware. Pt with blistering rash on chest and trunk     Exercises General Exercises - Upper Extremity Wrist Flexion: AROM;Both;10 reps Wrist  Extension: AROM;Both;10 reps Digit Composite Flexion: AROM;Both;10 reps Composite Extension: AROM;Both;10 reps General Exercises - Lower Extremity Ankle Circles/Pumps: AROM;Both;10 reps;Supine   Assessment/Plan    PT Assessment Patient needs continued PT services  PT Problem List Decreased strength;Decreased range of motion;Decreased activity tolerance;Decreased balance;Decreased mobility;Decreased coordination;Decreased cognition;Pain       PT Treatment Interventions DME instruction;Gait training;Functional mobility training;Therapeutic activities;Therapeutic exercise;Balance training;Cognitive remediation;Patient/family education    PT Goals (Current goals can be found in the Care Plan section)  Acute Rehab PT Goals Patient Stated Goal: get strength back  PT Goal Formulation: With patient Time For Goal Achievement: 07/01/19 Potential to Achieve Goals: Fair    Frequency Min 3X/week   Barriers to discharge Other (comment)(currently homeless)         AM-PAC PT "6 Clicks" Mobility  Outcome Measure Help needed turning from your back to your side while in a flat bed without using bedrails?: Total Help needed moving from lying on your back to sitting on the side of a flat bed without using bedrails?: Total Help needed moving to and from a bed to a chair (including a wheelchair)?: Total Help needed standing up from a chair using your arms (e.g., wheelchair or bedside chair)?: Total Help needed to walk in hospital room?: Total Help needed climbing 3-5 steps with a railing? : Total 6 Click Score: 6    End of Session Equipment Utilized During Treatment: Oxygen Activity Tolerance: Patient limited by pain Patient left: in bed;with call bell/phone within reach;with bed alarm set Nurse Communication: Mobility status;Other (comment)(Tick crawling on patient) PT Visit Diagnosis: Muscle weakness (generalized) (M62.81);Other abnormalities of gait and mobility (R26.89);Pain;Difficulty in  walking, not elsewhere classified (R26.2) Pain - part of body: (LE muscles> UE muscles)    Time: 7829-5621 PT Time Calculation (min) (ACUTE ONLY): 17 min   Charges:   PT Evaluation $PT Eval Moderate Complexity: 1 Mod          Monica Stephens B. Monica Stephens PT, DPT Acute Rehabilitation Services Pager [phone] Office [phone]   [place] 06/17/2019, 4:56 PM

## 2019-06-17 NOTE — CV Procedure (Signed)
TRANSESOPHAGEAL ECHOCARDIOGRAM (TEE) NOTE  INDICATIONS: infective endocarditis  PROCEDURE:   Informed consent was obtained prior to the procedure. The risks, benefits and alternatives for the procedure were discussed and the patient comprehended these risks.  Risks include, but are not limited to, cough, sore throat, vomiting, nausea, somnolence, esophageal and stomach trauma or perforation, bleeding, low blood pressure, aspiration, pneumonia, infection, trauma to the teeth and death.    After a procedural time-out, the patient was given propofol per anesthesia fore sedation.  [name] heart rate, blood pressure, and oxygen saturation are monitored continuously during the procedure. The transesophageal probe was inserted in the esophagus and stomach without difficulty and multiple views were obtained.  The patient was kept under observation until the patient left the procedure room.  The patient left the procedure room in stable condition.   Agitated microbubble saline contrast was not administered.  COMPLICATIONS:    There were no immediate complications.  Findings:  1. LEFT VENTRICLE: The left ventricular wall thickness is normal.  The left ventricular cavity is normal in size. Wall motion is normal.  LVEF is 60-65%.  2. RIGHT VENTRICLE:  The right ventricle is normal in structure and function without any thrombus or masses.    3. LEFT ATRIUM:  The left atrium is normal in size without any thrombus or masses.  There is not spontaneous echo contrast ("smoke") in the left atrium consistent with a low flow state.  4. LEFT ATRIAL APPENDAGE:  The left atrial appendage is free of any thrombus or masses. The appendage has single lobes. Pulse doppler indicates moderate flow in the appendage.  5. ATRIAL SEPTUM:  The atrial septum appears intact and is free of thrombus and/or masses.  There is no evidence for interatrial shunting by color doppler and saline microbubble (microbubbles were  noted from her IV line)  6. RIGHT ATRIUM:  The right atrium is normal in size and function without any thrombus or masses.  7. MITRAL VALVE:  The mitral valve is normal in structure and function with trivial regurgitation.  There were no vegetations or stenosis.  8. AORTIC VALVE:  The aortic valve is trileaflet, normal in structure and function with no regurgitation.  There were no vegetations or stenosis  9. TRICUSPID VALVE:  The tricuspid valve is normal in structure and function with trivial regurgitation.  There were no vegetations or stenosis  10.  PULMONIC VALVE:  The pulmonic valve is normal in structure and function with trivial regurgitation.  There were no vegetations or stenosis.   11. AORTIC ARCH, ASCENDING AND DESCENDING AORTA:  There was no Ron Parker et. Al, 1992) atherosclerosis of the ascending aorta, aortic arch, or proximal descending aorta.  12. PULMONARY VEINS: Anomalous pulmonary venous return was not noted.  13. PERICARDIUM: The pericardium appeared normal and non-thickened.  There is a trivial pericardial effusion.  IMPRESSION:   1. No endocarditis 2. No LAA thrombus 3. Negative for PFO 4. Trivial pericardial effusion 5. LVEF 60-65% with normal wall motion  RECOMMENDATIONS:    1.  Treatment as per ID for bacteremia without endocarditis.  Time Spent Directly with the Patient:  45 minutes   Pixie Casino, MD, [facility], [place] Director of the Advanced Lipid Disorders &  Cardiovascular Risk Reduction Clinic Diplomate of the American Board of Clinical Lipidology Attending Cardiologist  Direct Dial: [phone]  Fax: [phone]  Website:  [email or web address] 06/17/2019, 9:21 AM

## 2019-06-17 NOTE — Anesthesia Postprocedure Evaluation (Signed)
Anesthesia Post Note  Patient: Monica Stephens  Procedure(s) Performed: TRANSESOPHAGEAL ECHOCARDIOGRAM (TEE) (N/A )     Patient location during evaluation: Endoscopy Anesthesia Type: MAC Level of consciousness: awake and alert Pain management: pain level controlled Vital Signs Assessment: post-procedure vital signs reviewed and stable Respiratory status: spontaneous breathing, nonlabored ventilation and respiratory function stable Cardiovascular status: stable and blood pressure returned to baseline Postop Assessment: no apparent nausea or vomiting Anesthetic complications: no    Last Vitals:  Vitals:   06/17/19 0924 06/17/19 0931  BP: (!) 88/41 (!) 97/54  Pulse: (!) 111 (!) 105  Resp: 18 13  Temp: 36.7 C   SpO2: 94% 92%    Last Pain:  Vitals:   06/17/19 0931  TempSrc:   PainSc: [place]

## 2019-06-18 ENCOUNTER — Encounter (HOSPITAL_COMMUNITY): Payer: Self-pay | Admitting: General Practice

## 2019-06-18 ENCOUNTER — Other Ambulatory Visit: Payer: Self-pay

## 2019-06-18 DIAGNOSIS — J90 Pleural effusion, not elsewhere classified: Secondary | ICD-10-CM

## 2019-06-18 DIAGNOSIS — E875 Hyperkalemia: Secondary | ICD-10-CM

## 2019-06-18 LAB — BASIC METABOLIC PANEL
Anion gap: 6 (ref 5–15)
Anion gap: 7 (ref 5–15)
Anion gap: 5 (ref 5–15)
BUN: 39 mg/dL — ABNORMAL HIGH (ref 6–20)
BUN: 39 mg/dL — ABNORMAL HIGH (ref 6–20)
BUN: 37 mg/dL — ABNORMAL HIGH (ref 6–20)
CO2: 26 mmol/L (ref 22–32)
CO2: 26 mmol/L (ref 22–32)
CO2: 27 mmol/L (ref 22–32)
Calcium: 8.4 mg/dL — ABNORMAL LOW (ref 8.9–10.3)
Calcium: 8.2 mg/dL — ABNORMAL LOW (ref 8.9–10.3)
Calcium: 8.4 mg/dL — ABNORMAL LOW (ref 8.9–10.3)
Chloride: 107 mmol/L (ref 98–111)
Chloride: 103 mmol/L (ref 98–111)
Chloride: 106 mmol/L (ref 98–111)
Creatinine, Ser: 1.09 mg/dL — ABNORMAL HIGH (ref 0.44–1.00)
Creatinine, Ser: 0.83 mg/dL (ref 0.44–1.00)
Creatinine, Ser: 0.99 mg/dL (ref 0.44–1.00)
GFR calc Af Amer: >60 mL/min (ref >60)
GFR calc Af Amer: >60 mL/min (ref >60)
GFR calc Af Amer: >60 mL/min (ref >60)
GFR calc non Af Amer: >60 mL/min (ref >60)
GFR calc non Af Amer: >60 mL/min (ref >60)
GFR calc non Af Amer: >60 mL/min (ref >60)
Glucose, Bld: 116 mg/dL — ABNORMAL HIGH (ref 70–99)
Glucose, Bld: 100 mg/dL — ABNORMAL HIGH (ref 70–99)
Glucose, Bld: 99 mg/dL (ref 70–99)
Potassium: 5.4 mmol/L — ABNORMAL HIGH (ref 3.5–5.1)
Potassium: 5.5 mmol/L — ABNORMAL HIGH (ref 3.5–5.1)
Potassium: 5.8 mmol/L — ABNORMAL HIGH (ref 3.5–5.1)
Sodium: 139 mmol/L (ref 135–145)
Sodium: 136 mmol/L (ref 135–145)
Sodium: 138 mmol/L (ref 135–145)

## 2019-06-18 LAB — HEPATIC FUNCTION PANEL
ALT: 21 U/L (ref 0–44)
AST: 33 U/L (ref 15–41)
Albumin: 1.2 g/dL — ABNORMAL LOW (ref 3.5–5.0)
Alkaline Phosphatase: 94 U/L (ref 38–126)
Bilirubin, Direct: 0.2 mg/dL (ref 0.0–0.2)
Indirect Bilirubin: 0.3 mg/dL (ref 0.3–0.9)
Total Bilirubin: 0.5 mg/dL (ref 0.3–1.2)
Total Protein: 5.1 g/dL — ABNORMAL LOW (ref 6.5–8.1)

## 2019-06-18 LAB — CBC
HCT: 25.6 % — ABNORMAL LOW (ref 36.0–46.0)
Hemoglobin: 8.1 g/dL — ABNORMAL LOW (ref 12.0–15.0)
MCH: 28.8 pg (ref 26.0–34.0)
MCHC: 31.6 g/dL (ref 30.0–36.0)
MCV: 91.1 fL (ref 80.0–100.0)
Platelets: 238 10*3/uL (ref 150–400)
RBC: 2.81 MIL/uL — ABNORMAL LOW (ref 3.87–5.11)
RDW: 16.5 % — ABNORMAL HIGH (ref 11.5–15.5)
WBC: 10.8 10*3/uL — ABNORMAL HIGH (ref 4.0–10.5)
nRBC: 0.2 % (ref 0.0–0.2)

## 2019-06-18 LAB — AEROBIC CULTURE W GRAM STAIN (SUPERFICIAL SPECIMEN): Special Requests: NORMAL

## 2019-06-18 LAB — PATHOLOGIST SMEAR REVIEW: Path Review: 6182020

## 2019-06-18 LAB — T4, FREE: Free T4: 0.61 ng/dL (ref 0.61–1.12)

## 2019-06-18 LAB — TSH: TSH: 83.772 u[IU]/mL — ABNORMAL HIGH (ref 0.350–4.500)

## 2019-06-18 MED ORDER — SENNOSIDES-DOCUSATE SODIUM 8.6-50 MG PO TABS
1.0000 | ORAL_TABLET | Freq: Once | ORAL | Status: AC
  Administered 2019-06-18: 1 via ORAL
  Filled 2019-06-18: qty 1

## 2019-06-18 MED ORDER — SODIUM POLYSTYRENE SULFONATE 15 GM/60ML PO SUSP
15.0000 g | Freq: Once | ORAL | Status: AC
  Administered 2019-06-18: 15 g via ORAL
  Filled 2019-06-18: qty 60

## 2019-06-18 MED ORDER — SODIUM CHLORIDE 0.9 % IV SOLN
INTRAVENOUS | Status: DC
  Administered 2019-06-18 – 2019-06-19 (×2): via INTRAVENOUS

## 2019-06-18 MED ORDER — BISACODYL 10 MG RE SUPP: 10.0000 mg | Freq: Once | RECTAL | Status: DC | PRN

## 2019-06-18 MED ORDER — BISACODYL 10 MG RE SUPP
10.0000 mg | Freq: Once | RECTAL | Status: AC
  Administered 2019-06-18: 10 mg via RECTAL
  Filled 2019-06-18: qty 1

## 2019-06-18 MED ORDER — BISACODYL 10 MG RE SUPP: 10.0000 mg | Freq: Once | RECTAL | Status: DC

## 2019-06-18 NOTE — Progress Notes (Signed)
Pt with a bradycardia episode at 1226 with rate of 37 and pause of 2.3 sec, then at 1241 with a rate of 35 and pause of 2.5 sec.  Both episodes lasted a few seconds then pt rate returned to SR-ST.  Pt O2 sat decreased to the 80's while asleep and lethargic on 2lpm via [place].  Pt easily aroused and encouraged to deep breath.  O2 increased to 3lpm with sats sustaining in low 90's.  EKG obtained and MD notified.  Current rate in 90's, low 100's at this time with O2 sat 95% on 2lpm.  O2 decreased to 2lpm.

## 2019-06-18 NOTE — Progress Notes (Signed)
[facility]  Date of Admission:  06/14/2019      Total days of antibiotics 4  Vancomycin day 4          Patient ID: Monica Stephens is a 38 y.o. female with  Principal Problem:   MRSA bacteremia Active Problems:   Septic pulmonary embolism, bilateral    Suspected endocarditis   Active intravenous drug use   Opioid overdose (HCC)   Severe opioid dependence (HCC)   Pericardial effusion   Rash/skin eruption   . busPIRone  10 mg Oral BID  . heparin  5,000 Units Subcutaneous Q8H  . levothyroxine  150 mcg Oral Once per day on Sun Tue Wed Thu Fri Sat  . levothyroxine  225 mcg Oral Q Mon  . methadone  20 mg Oral BID  . polyethylene glycol  17 g Oral Daily    SUBJECTIVE: Increased swelling of the arms R>L today on exam. Still with ongoing chest "sharp needle pain" with inspiration L>R.    Review of Systems: Review of Systems  Constitutional: Negative for fever.  Eyes: Negative for blurred vision.  Respiratory: Negative for cough and shortness of breath.   Cardiovascular: Positive for chest pain. Negative for leg swelling.  Gastrointestinal: Positive for abdominal pain and constipation.  Genitourinary: Negative for dysuria.  Musculoskeletal: Positive for back pain.  Skin: Positive for rash. Negative for itching.  Neurological: Negative for tingling and headaches.    No Known Allergies  OBJECTIVE: Vitals:   06/17/19 2155 06/18/19 0511 06/18/19 0521 06/18/19 1050  BP: (!) 97/54 (!) 104/59    Pulse: (!) 108 (!) 112  96  Resp:  12    Temp: 98.7 F (37.1 C) 98.4 F (36.9 C)    TempSrc: Oral Axillary    SpO2:  93%  94%  Weight:   56.2 kg   Height:       Body mass index is 22.66 kg/m.  Physical Exam Constitutional:      Appearance: She is ill-appearing.     Comments: Resting in bed  HENT:     Mouth/Throat:     Mouth: Mucous membranes are moist.     Pharynx: No oropharyngeal exudate.  Eyes:     General: No scleral icterus.  Neck:     Musculoskeletal: Normal range of motion.  Cardiovascular:     Rate and Rhythm: Normal rate and regular rhythm.     Pulses: Normal pulses.  Abdominal:     General: There is distension.     Tenderness: There is abdominal tenderness.  Musculoskeletal:        General: Tenderness (all over) present.     Comments: Difficult lifting right and left leg to gravity but slowly able to perform task and flex at the knees/hips.   Skin:    General: Skin is warm and dry.     Capillary Refill: Capillary refill takes less than 2 seconds.     Comments: Rash to chest with some improved/receeding erythema compared to initial presentation.   Neurological:     Mental Status: She is alert and oriented to person, place, and time.     Lab Results Lab Results  Component Value Date   WBC 10.8 (H) 06/18/2019   HGB 8.1 (L) 06/18/2019   HCT 25.6 (L) 06/18/2019   MCV 91.1 06/18/2019   PLT 238 06/18/2019    Lab Results  Component Value Date   CREATININE 1.09 (H) 06/18/2019  BUN 39 (H) 06/18/2019   NA 139 06/18/2019   K 5.4 (H) 06/18/2019   CL 107 06/18/2019   CO2 26 06/18/2019    Lab Results  Component Value Date   ALT 21 06/18/2019   AST 33 06/18/2019   ALKPHOS 94 06/18/2019   BILITOT 0.5 06/18/2019     Microbiology: 6/14 BCx >> MRSA + 2/2 sets 6/15 BCx >> NGTD x 2d  6/17 BCx >> NGTD 6/17 Chest culture > few staph aureus  6/17 Chest zoster PCR > pending   ASSESSMENT & PLAN:  1. MRSA Bacteremia = repeated blood cultures drawn and no growth x 2d. On vancomycin. TEE negative although suspect she had involvement at some point. Paraspinal muscle infection, innumerable. ?Septic bony infarction of the L ileum. 6-8 weeks IV therapy given disseminated infection and all findings below once her loculated pulmonary effusion is drained as the last durable foci of infection.   2. Septic Pulmonary Emboli, B/L = highly suspicious for right sided endocarditis   3. Loculated Pleural Effusion, L  = given severe pulmonary findings described on CT scan 6/14 would suspect this to be evolving empyema de to MRSA. Would recommend consultation with IR vs CT surgery for consideration of drainage.   4. Suspected Right-sided Endocarditis = TEE without signs of left or right sided endocarditis. I suspect she has a vegetation at some point on the tricuspid valve that embolized to the lungs. Fortunately she has no remaining vegetation or valvular dysfunction and will not likely require surgical intervention.    5. Pericardial Effusion = trivial on echo w/o any pericardial thickening to suggest pericarditis.    6. Opioid Use Disorder, Severe = appreciate the care and attention from primary team to address and prevent withdrawals. Seems to be doing better.   7. Paraspinal Abscesses, Ilium Bony Infarction = MRI findings with "innumerable paraspinal abscesses" and finding of a septic bony infarction involving the left ilium I would think suggests more left sided endocarditis given this indicates arterial occlusion to the bone. No discitis/osteomyelitis. Will need 6 weeks minimum of IV therapy with reassessment there after.   8. Rash = MRSA on wound culture. Appears to be improving. Viral PCR pending.   9. Abdominal Distension = started on bowel regimen    Janene Madeira, MSN, NP-C [facility] [email or web address] Pager: [phone] Office: [phone] [facility] Main Line: [phone]   06/18/2019  1:21 PM
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
## 2019-06-18 NOTE — Progress Notes (Signed)
Subjective: Able to hold down food and juice. She has not had a bowel movement yet.   Objective:  Vital signs in last 24 hours: Vitals:   06/17/19 1412 06/17/19 2155 06/18/19 0511 06/18/19 0521  BP:  (!) 97/54 (!) 104/59   Pulse:  (!) 108 (!) 112   Resp: 18  12   Temp: 98.3 F (36.8 C) 98.7 F (37.1 C) 98.4 F (36.9 C)   TempSrc: Oral Oral Axillary   SpO2:   93%   Weight:    56.2 kg  Height:       General: no acute distress CV: Mild tachycardia Abdomen: Soft, tenderness on abdominal wall around the rash Pulm/chest exam: No respiratory distress. Skin: Has rash in lower abdomen as well as sternal area (with blister  in sternal area) Neurologic exam: A & O x 3 Skin: Rash in abdomen and blister in chest are present but improved  Assessment/Plan:  Principal Problem:   MRSA bacteremia Active Problems:   Opioid overdose ([place])   Septic pulmonary embolism, bilateral    Suspected endocarditis   Active intravenous drug use   Severe opioid dependence ([place])   Pericardial effusion   Rash/skin eruption   Ms. Liwanag is a 38 year old female withpolysubstance abuse including IVheroin, bipolar, history of thyroid cancer who presents with chest pain and dyspneaand found to have bilateral septic pulmonary emboliwith severe pulmonary infection and MRSA bacteremia.  MRSA bacteremia, septic emboli with pulmonary opacities Likely endocarditis [facility] 6/14  MRSA positive BC 6/15 no growth 3 days [facility] 6/17 pending   Clinically improved today. Has less pain on the area around the rash but complains of back pain and generalized pain. No localized pain on spinal area but we have low threshold for imaging to r/u metastatic infection.  TEE negative for active vegetation or valvular abnormality.  Lumbar and thoracic MRI w and w/o contrast (I talked to MRI staff and her GFR is ok for contrast)showed septic emboli in paraspinal muscles and ilium. No osteomlyelitis  MRI showed  showed Loculated  pleural effusion. Spoke with ID regarding loculated pleural effusion. Recommended chest tube. Will consult CT surgery.   -Ct with vancomycin (Started on 6/14. will need 6 weeks of AB) -F/u Repeat BC  -ID is following. Appreciate recommendations -Consulted CT surgery. Appreciated recs -No line untilrepeat BC is negative. -Patient will need long-term antibiotics in house  Acute normocytic anemia: Hb stable at 8.1. Plt nl. Bil is normal. No evidence of active bleeding or hemolysis. PBS showed normocytic anemia, toxic granulation. No evidence of DIC Can be dilutional due to IV Fluid or due to current sepsis -CBC daily  Opioid Use Disorder: Constipation and abdominal distention: Mild ileus and moderate stool burden on abdominal Xray  She received 3 doses of Dilauded in past 24 h. Still has constipation despite Miralax and Senna. Has distended but soft abdomen. BS are present.   -Giving Bisacodyl suppository for constipation.  We will do soapsuds enema if no bowel movement with current treatment. -Continue with IV Dilaudid q6 h PRN -Ct Methadone 20 mg BID will start switching to suboxone 6/18 -Ct Buspirone 10 mg BID for anxiety  -Ct Hydroxyzine 25 mg TID PRN for anxiety -PT/Ot eval and treat   Hyperkalemia at 5.4.  EKG is unremarkable -Repeat potassium this afternoon and consider Kayexalate if needed.  Vesicular rash:Pain and erythema improved Culture with WBC and rare gr positive cocci. Likley due to MRSA bacterima. -Varicella PCR is pending -Continue to monitor, pain medications  as above  AKI:Cr trending down.  Today creatinine is 1.09 Renal ultrasound unremarkable.Likely prerenal azotemia. -Will give additional IVF -CMP daily  Dispo: Anticipated discharge in approximately 6 weeks  Dewayne Hatch, MD 06/18/2019, 5:32 AM Pager: @[phone] @

## 2019-06-18 NOTE — Progress Notes (Signed)
OT Cancellation Note  Patient Details Name: Monica Stephens MRN: [id number] DOB: [birth year]   Cancelled Treatment:    Reason Eval/Treat Not Completed: Patient's level of consciousness. Patient in bed upon therapy arrival. Unable to wake up and remain awake to participate in OT evaluation. Patient would wake up and open eyes although instantly fall back to sleep and begin to snore. Pt requested to eat vanilla ice cream on lunch tray. Loosely held container. Unable to open plastic to remove spoon/did not attempt when asked due to going in and out of sleep. Pt was able to successfully bring one spoonful of ice cream to her mouth to eat with increased time. Therapist then placed ice cream back on tray as patient fell back to sleep after swallowing.  Will re-attempt evaluation at a later time when patient is able to fully participate.    Ailene Ravel, OTR/L,CBIS  [phone]  06/18/2019, 3:54 PM

## 2019-06-19 LAB — CBC
HCT: 23.4 % — ABNORMAL LOW (ref 36.0–46.0)
Hemoglobin: 7.5 g/dL — ABNORMAL LOW (ref 12.0–15.0)
MCH: 29.6 pg (ref 26.0–34.0)
MCHC: 32.1 g/dL (ref 30.0–36.0)
MCV: 92.5 fL (ref 80.0–100.0)
Platelets: 248 10*3/uL (ref 150–400)
RBC: 2.53 MIL/uL — ABNORMAL LOW (ref 3.87–5.11)
RDW: 16.8 % — ABNORMAL HIGH (ref 11.5–15.5)
WBC: 7.1 10*3/uL (ref 4.0–10.5)
nRBC: 0.4 % — ABNORMAL HIGH (ref 0.0–0.2)

## 2019-06-19 LAB — BASIC METABOLIC PANEL
Anion gap: 3 — ABNORMAL LOW (ref 5–15)
Anion gap: 8 (ref 5–15)
BUN: 36 mg/dL — ABNORMAL HIGH (ref 6–20)
BUN: 32 mg/dL — ABNORMAL HIGH (ref 6–20)
CO2: 30 mmol/L (ref 22–32)
CO2: 24 mmol/L (ref 22–32)
Calcium: 8 mg/dL — ABNORMAL LOW (ref 8.9–10.3)
Calcium: 7.8 mg/dL — ABNORMAL LOW (ref 8.9–10.3)
Chloride: 108 mmol/L (ref 98–111)
Chloride: 109 mmol/L (ref 98–111)
Creatinine, Ser: 0.9 mg/dL (ref 0.44–1.00)
Creatinine, Ser: 0.78 mg/dL (ref 0.44–1.00)
GFR calc Af Amer: >60 mL/min (ref >60)
GFR calc Af Amer: >60 mL/min (ref >60)
GFR calc non Af Amer: >60 mL/min (ref >60)
GFR calc non Af Amer: >60 mL/min (ref >60)
Glucose, Bld: 112 mg/dL — ABNORMAL HIGH (ref 70–99)
Glucose, Bld: 100 mg/dL — ABNORMAL HIGH (ref 70–99)
Potassium: 5.5 mmol/L — ABNORMAL HIGH (ref 3.5–5.1)
Potassium: 4.7 mmol/L (ref 3.5–5.1)
Sodium: 141 mmol/L (ref 135–145)
Sodium: 141 mmol/L (ref 135–145)

## 2019-06-19 LAB — VANCOMYCIN, PEAK: Vancomycin Pk: 26 ug/mL — ABNORMAL LOW (ref 30–40)

## 2019-06-19 LAB — VANCOMYCIN, TROUGH: Vancomycin Tr: 13 ug/mL — ABNORMAL LOW (ref 15–20)

## 2019-06-19 MED ORDER — ENSURE ENLIVE PO LIQD
237.0000 mL | Freq: Two times a day (BID) | ORAL | Status: DC
  Administered 2019-06-20 – 2019-06-21 (×2): 237 mL via ORAL

## 2019-06-19 MED ORDER — SODIUM POLYSTYRENE SULFONATE 15 GM/60ML PO SUSP
15.0000 g | Freq: Once | ORAL | Status: AC
  Administered 2019-06-19: 15 g via ORAL
  Filled 2019-06-19: qty 60

## 2019-06-19 MED ORDER — SODIUM CHLORIDE 0.9 % IV BOLUS
500.0000 mL | Freq: Once | INTRAVENOUS | Status: AC
  Administered 2019-06-19: 500 mL via INTRAVENOUS

## 2019-06-19 MED ORDER — VANCOMYCIN HCL IN DEXTROSE 1-5 GM/200ML-% IV SOLN
1000.0000 mg | INTRAVENOUS | Status: DC
  Administered 2019-06-20 – 2019-07-05 (×16): 1000 mg via INTRAVENOUS
  Filled 2019-06-19 (×18): qty 200

## 2019-06-19 NOTE — Progress Notes (Signed)
Pharmacy Antibiotic Note  Monica Stephens is a 38 y.o. female admitted on 06/14/2019 with MRSA bacteremia.  Pharmacy has been consulted for vancomycin dosing.  Vancomycin AUC within goal range  Plan: Will eventually need outpatient antibiotics, so will change Vancomycin 1 g IV q24h for convenience.  Will recheck levels in a few days or prior to discharge.     Height: 5\' 2"  (157.5 cm) Weight: 131 lb 2.8 oz (59.5 kg) IBW/kg (Calculated) : 50.1  Temp (24hrs), Avg:97.9 F (36.6 C), Min:97.8 F (36.6 C), Max:98 F (36.7 C)  Recent Labs  Lab 06/14/19 1036 06/14/19 1231 06/14/19 1833 06/15/19 0449  06/17/19 0430 06/18/19 1012 06/18/19 1713 06/18/19 1915 06/19/19 0503 06/19/19 1328 06/19/19 1336 06/19/19 2227  WBC 14.5*  --  9.3 9.6  --  8.3 10.8*  --   --  7.1  --   --   --   CREATININE 2.84*  --   --  1.77*   < > 1.39* 1.09* 0.83 0.99 0.90  --  0.78  --   LATICACIDVEN 2.6* 1.8  --   --   --   --   --   --   --   --   --   --   --   VANCOTROUGH  --   --   --   --   --   --   --   --   --   --   --   --  13*  VANCOPEAK  --   --   --   --   --   --   --   --   --   --  26*  --   --    < > = values in this interval not displayed.    Estimated Creatinine Clearance: 76.9 mL/min (by C-G formula based on SCr of 0.78 mg/dL).    No Known Allergies   Caryl Pina  06/19/2019 11:28 PM

## 2019-06-19 NOTE — Progress Notes (Addendum)
Subjective: Ms. Rund is doing ok and her pain is better but is concerned about swelling of her hands and legs. Also asks about the fluid in her lungs. Her questions and concerns were addressed.  She has had bowel movement yesterday and feel better in her abdomen after that.  She denies any blood in hier stool.  She denies any nausea or vomiting.   Objective:  Vital signs in last 24 hours: Vitals:   06/18/19 1945 06/18/19 2358 06/19/19 0348 06/19/19 0354  BP: 103/76 104/67 105/64   Pulse: 76 (!) 101 86   Resp: 16     Temp: 98.3 F (36.8 C) 98 F (36.7 C) 98 F (36.7 C)   TempSrc: Oral Oral Oral   SpO2: 92% 95% 95%   Weight:    59.5 kg  Height:       General: No acute distress CV: RRR, Nl S1S2, tele reviewed. No further bradycardia or pauses.1-2 + pitting/nonpitting edema of bilateral upper and lower extremities Abdomen: Soft, BS present Pulm/chest exam: No respiratory distress.   Assessment/Plan:  Principal Problem:   MRSA bacteremia Active Problems:   Opioid overdose ([place])   Septic pulmonary embolism, bilateral    Suspected endocarditis   Active intravenous drug use   Severe opioid dependence ([place])   Pericardial effusion   Rash/skin eruption  General: no acute distress CV: Mild tachycardia Abdomen: Soft, tenderness on abdominal wall around the rash Pulm/chest exam: No respiratory distress. Skin: Has rash in lower abdomen as well as sternal area (with blister in sternal area) Neurologic exam: A & O x 3 Skin: Rash in abdomen and blister in chest are present but improved  Assessment/Plan:  Principal Problem:   MRSA bacteremia Active Problems:   Opioid overdose ([place])   Septic pulmonary embolism, bilateral    Suspected endocarditis   Active intravenous drug use   Severe opioid dependence ([place])   Pericardial effusion   Rash/skin eruption  Ms. Pensabene is a 38 year old female withpolysubstance abuse including IVheroin, bipolar, history of thyroid cancer  who presents with chest pain and dyspneaand found to have bilateral septic pulmonary emboliwith severe pulmonary infection and MRSA bacteremia.  MRSA bacteremia,  Suspected right-sided endocarditis with septic emboli to lungs, emboli in paraspinal muscles and ilium  BC 6/14MRSA positive BC 6/15 no growth so far [facility] 6/17 no growth so far Loculated pleural effusion, concerning for formation for early formation of empyema.  Cardiothoracic surgery was consulted and did not recommend surgery at this point.  Recommended repeat CT scan a few days and then consider thoracotomy and drainage if patient gets more stable. Patient has low albumin, developed 1-2 + eating/nonpitting lower extremity edema today.  I called Ms. Lonni Dirden (mother) and updated her and addressed her questions.  -Ct withvancomycin (Started on 6/14.will need 6 weeks of AB) -F/uRepeat BC  -ID is following.Appreciaterecommendations -Consulted CT surgery. Appreciated recs -No line untilrepeat BC is negative. -Patient will need long-term antibiotics in house -Encourage patient intake and adding Ensure to diet. -Repeat CT scan next week for reassessing loculated pleural effusion  Acute normocytic anemia: Hb  trending down.  This morning hemoglobin is 7.5 from 8.1 yesterday Plt nl. Bil is normal. No evidence of active bleeding or hemolysis. PBS showed normocytic anemia, toxic granulation. No evidence of DIC Unclear etiology, can be dilutionaldue to IV Fluid as well as due to current illness/sepsis.  -Repeat CBC this afternoon -Transfusion goal>7 -CBC daily  Opioid Use Disorder: Constipation and abdominal distention: Mild ileus and  moderate stool burden on abdominal Xray Has bowel movement with bowel regimen.  -Ct with bowel regimen -Continue with IV Dilaudid q6 h PRN -CtMethadone 20 mg BIDwill start switching to suboxone 6/18 -CtBuspirone 10 mg BIDfor anxiety -CtHydroxyzine 25 mg TID PRNfor anxiety  -PT/Ot eval and treat--> SNF  Hyperkalemia at 5.5 Unclear etiology. Can be due to continues LR. Or hemolysis due to being difficult to blood drwan -Kayexalate 15 gr once -Repeat potassium this afternoon and consider Kayexalate if needed.--> K normalized to 4.7 -Switched LR to NS  Vesicular rash: improved Culture with WBC and rare gr positive cocci. Likley due to MRSA bacterima. -Varicella PCR is pending -Continue to monitor, pain medications as above  YBN:[id number]  Cr normalized to 0.99 Renal ultrasound unremarkable.Likely prerenal azotemia.  -CMP daily   Dispo: Anticipated discharge in approximately 6 weeks.  Dewayne Hatch, MD 06/19/2019, 5:25 AM Pager: [phone]

## 2019-06-19 NOTE — Progress Notes (Signed)
[facility]  Date of Admission:  06/14/2019      Total days of antibiotics 5  Vancomycin day 5          Patient ID: Monica Stephens is a 38 y.o. female with  Principal Problem:   MRSA bacteremia Active Problems:   Septic pulmonary embolism, bilateral    Suspected endocarditis   Active intravenous drug use   Opioid overdose ([facility])   Severe opioid dependence ([facility])   Pericardial effusion   Rash/skin eruption    busPIRone  10 mg Oral BID   heparin  5,000 Units Subcutaneous Q8H   levothyroxine  150 mcg Oral Once per day on Sun Tue Wed Thu Fri Sat   levothyroxine  225 mcg Oral Q Mon   methadone  20 mg Oral BID   polyethylene glycol  17 g Oral Daily    Interval Hx: Noted recs from TCTS - continue antibiotics with repeat CT scan in a few days, IR approach may not be helpful given multiloculated features.   Cultures from 6/17 now growing Staph Aureus in 2/4 bottles. Afebrile, still requiring 1-2 LPM via [place] to maintain oxygenation. WBC normalized.   Review of Systems: Review of Systems  Constitutional: Negative for fever.  Eyes: Negative for blurred vision.  Respiratory: Negative for cough and shortness of breath.   Cardiovascular: Positive for chest pain. Negative for leg swelling.  Gastrointestinal: Positive for abdominal pain and constipation.  Genitourinary: Negative for dysuria.  Musculoskeletal: Positive for back pain.  Skin: Positive for rash. Negative for itching.  Neurological: Negative for tingling and headaches.    No Known Allergies  OBJECTIVE: Vitals:   06/19/19 0348 06/19/19 0354 06/19/19 0800 06/19/19 1407  BP: 105/64  103/74 (!) 98/57  Pulse: 86  (!) 104 95  Resp:    15  Temp: 98 F (36.7 C)   97.8 F (36.6 C)  TempSrc: Oral   Oral  SpO2: 95%  100% 95%  Weight:  59.5 kg    Height:       Body mass index is 23.99 kg/m.  Physical Exam Constitutional:      Appearance: She is ill-appearing.     Comments:  Resting in bed  HENT:     Mouth/Throat:     Mouth: Mucous membranes are moist.     Pharynx: No oropharyngeal exudate.  Eyes:     General: No scleral icterus. Neck:     Musculoskeletal: Normal range of motion.  Cardiovascular:     Rate and Rhythm: Normal rate and regular rhythm.     Pulses: Normal pulses.  Abdominal:     General: There is distension.     Tenderness: There is abdominal tenderness.  Musculoskeletal:        General: Tenderness (all over) present.     Comments: Difficult lifting right and left leg to gravity but slowly able to perform task and flex at the knees/hips.   Skin:    General: Skin is warm and dry.     Capillary Refill: Capillary refill takes less than 2 seconds.     Comments: Rash to chest with some improved/receeding erythema compared to initial presentation.   Neurological:     Mental Status: She is alert and oriented to person, place, and time.     Lab Results Lab Results  Component Value Date   WBC 7.1 06/19/2019   HGB 7.5 (L) 06/19/2019   HCT 23.4 (L) 06/19/2019  MCV 92.5 06/19/2019   PLT 248 06/19/2019    Lab Results  Component Value Date   CREATININE 0.78 06/19/2019   BUN 32 (H) 06/19/2019   NA 141 06/19/2019   K 4.7 06/19/2019   CL 109 06/19/2019   CO2 24 06/19/2019    Lab Results  Component Value Date   ALT 21 06/18/2019   AST 33 06/18/2019   ALKPHOS 94 06/18/2019   BILITOT 0.5 06/18/2019     Microbiology: 6/14 BCx >> MRSA + 2/2 sets 6/15 BCx >> NGTD x 2d  6/17 BCx >> Staph Aureus 2/4 bottles 6/17 Chest culture > few staph aureus  6/17 Chest zoster PCR > pending   ASSESSMENT & PLAN:  1. MRSA Bacteremia =  On vancomycin with adequate levels. TEE negative 6/17 although suspect she had involvement at some point. Unfortunately repeated blood cultures drawn on 6/17 now growing staph aureus in 2/4 bottles indicating high buren of infection likely related to source control. Concerned about her loculated effusion/empyema  contributing. If unable to clear bacteremia may need to consider surgery for drainage.   2. Septic Pulmonary Emboli, B/L = highly suspicious for right sided endocarditis   1. Loculated Pleural Effusion, L = given severe pulmonary findings described on CT scan 6/14 would suspect this to be evolving empyema de to MRSA. Repeat CT scan in a few days.   2. Suspected Endocarditis = TEE without signs of left or right sided endocarditis. I suspect she had a vegetation at some point given disseminated disease. Fortunately she has no remaining vegetation or valvular dysfunction and will not likely require surgical intervention.    3. Pericardial Effusion = trivial on echo w/o any pericardial thickening to suggest pericarditis.    4. Opioid Use Disorder, Severe = appreciate the care and attention from primary team to address and prevent withdrawals. Seems to be doing better.   5. Paraspinal Abscesses, Ilium Bony Infarction = MRI findings with "innumerable paraspinal abscesses" and finding of a septic bony infarction involving the left ilium I would think suggests more left sided endocarditis given this indicates arterial occlusion to the bone. No discitis/osteomyelitis. Will need 6 weeks minimum of IV therapy with reassessment there after.   6. Rash = MRSA on wound culture. Appears to be improving. Viral PCR pending.   7. Abdominal Distension = started on bowel regimen    Janene Madeira, MSN, NP-C [facility] Infectious Disease [email or web address] Pager: [phone] Office: [phone] [place]: [phone]   06/19/2019  4:43 PM

## 2019-06-19 NOTE — Progress Notes (Signed)
Patient approved for Nicole Cella (boyfriend) to be given medical updates and information.
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
## 2019-06-20 ENCOUNTER — Inpatient Hospital Stay (HOSPITAL_COMMUNITY): Payer: Self-pay

## 2019-06-20 DIAGNOSIS — F1120 Opioid dependence, uncomplicated: Secondary | ICD-10-CM

## 2019-06-20 DIAGNOSIS — T402X1A Poisoning by other opioids, accidental (unintentional), initial encounter: Secondary | ICD-10-CM

## 2019-06-20 DIAGNOSIS — L089 Local infection of the skin and subcutaneous tissue, unspecified: Secondary | ICD-10-CM

## 2019-06-20 DIAGNOSIS — R0689 Other abnormalities of breathing: Secondary | ICD-10-CM

## 2019-06-20 DIAGNOSIS — J9692 Respiratory failure, unspecified with hypercapnia: Secondary | ICD-10-CM

## 2019-06-20 DIAGNOSIS — A419 Sepsis, unspecified organism: Secondary | ICD-10-CM

## 2019-06-20 DIAGNOSIS — I471 Supraventricular tachycardia: Secondary | ICD-10-CM

## 2019-06-20 LAB — CULTURE, BLOOD (ROUTINE X 2)
Culture: NO GROWTH
Culture: NO GROWTH
Special Requests: ADEQUATE
Special Requests: ADEQUATE
Special Requests: ADEQUATE
Special Requests: ADEQUATE

## 2019-06-20 LAB — BASIC METABOLIC PANEL
Anion gap: 6 (ref 5–15)
Anion gap: 6 (ref 5–15)
BUN: 29 mg/dL — ABNORMAL HIGH (ref 6–20)
BUN: 33 mg/dL — ABNORMAL HIGH (ref 6–20)
CO2: 28 mmol/L (ref 22–32)
CO2: 31 mmol/L (ref 22–32)
Calcium: 8.1 mg/dL — ABNORMAL LOW (ref 8.9–10.3)
Calcium: 8.4 mg/dL — ABNORMAL LOW (ref 8.9–10.3)
Chloride: 107 mmol/L (ref 98–111)
Chloride: 106 mmol/L (ref 98–111)
Creatinine, Ser: 0.81 mg/dL (ref 0.44–1.00)
Creatinine, Ser: 0.74 mg/dL (ref 0.44–1.00)
GFR calc Af Amer: >60 mL/min (ref >60)
GFR calc Af Amer: >60 mL/min (ref >60)
GFR calc non Af Amer: >60 mL/min (ref >60)
GFR calc non Af Amer: >60 mL/min (ref >60)
Glucose, Bld: 127 mg/dL — ABNORMAL HIGH (ref 70–99)
Glucose, Bld: 89 mg/dL (ref 70–99)
Potassium: 4.7 mmol/L (ref 3.5–5.1)
Potassium: 4.6 mmol/L (ref 3.5–5.1)
Sodium: 141 mmol/L (ref 135–145)
Sodium: 143 mmol/L (ref 135–145)

## 2019-06-20 LAB — T4, FREE: Free T4: 0.53 ng/dL — ABNORMAL LOW (ref 0.61–1.12)

## 2019-06-20 LAB — CBC
HCT: 25.6 % — ABNORMAL LOW (ref 36.0–46.0)
HCT: 25.4 % — ABNORMAL LOW (ref 36.0–46.0)
Hemoglobin: 8 g/dL — ABNORMAL LOW (ref 12.0–15.0)
Hemoglobin: 7.8 g/dL — ABNORMAL LOW (ref 12.0–15.0)
MCH: 29.3 pg (ref 26.0–34.0)
MCH: 29.2 pg (ref 26.0–34.0)
MCHC: 31.3 g/dL (ref 30.0–36.0)
MCHC: 30.7 g/dL (ref 30.0–36.0)
MCV: 93.8 fL (ref 80.0–100.0)
MCV: 95.1 fL (ref 80.0–100.0)
Platelets: 289 10*3/uL (ref 150–400)
Platelets: 280 10*3/uL (ref 150–400)
RBC: 2.73 MIL/uL — ABNORMAL LOW (ref 3.87–5.11)
RBC: 2.67 MIL/uL — ABNORMAL LOW (ref 3.87–5.11)
RDW: 16.7 % — ABNORMAL HIGH (ref 11.5–15.5)
RDW: 16.7 % — ABNORMAL HIGH (ref 11.5–15.5)
WBC: 11.2 10*3/uL — ABNORMAL HIGH (ref 4.0–10.5)
WBC: 8.6 10*3/uL (ref 4.0–10.5)
nRBC: 0.4 % — ABNORMAL HIGH (ref 0.0–0.2)
nRBC: 0.9 % — ABNORMAL HIGH (ref 0.0–0.2)

## 2019-06-20 LAB — BLOOD GAS, ARTERIAL
Acid-Base Excess: 4.2 mmol/L — ABNORMAL HIGH (ref 0.0–2.0)
Bicarbonate: 31.3 mmol/L — ABNORMAL HIGH (ref 20.0–28.0)
Drawn by: 283381
O2 Content: 4 L/min
O2 Saturation: 97 %
Patient temperature: 98.6
pCO2 arterial: 77.1 mmHg (ref 32.0–48.0)
pH, Arterial: 7.232 — ABNORMAL LOW (ref 7.350–7.450)
pO2, Arterial: 107 mmHg (ref 83.0–108.0)

## 2019-06-20 LAB — POCT I-STAT 7, (LYTES, BLD GAS, ICA,H+H)
Acid-Base Excess: 7 mmol/L — ABNORMAL HIGH (ref 0.0–2.0)
Bicarbonate: 34.6 mmol/L — ABNORMAL HIGH (ref 20.0–28.0)
Calcium, Ion: 1.26 mmol/L (ref 1.15–1.40)
HCT: 21 % — ABNORMAL LOW (ref 36.0–46.0)
Hemoglobin: 7.1 g/dL — ABNORMAL LOW (ref 12.0–15.0)
O2 Saturation: 99 %
Potassium: 4.9 mmol/L (ref 3.5–5.1)
Sodium: 142 mmol/L (ref 135–145)
TCO2: 37 mmol/L — ABNORMAL HIGH (ref 22–32)
pCO2 arterial: 70.3 mmHg (ref 32.0–48.0)
pH, Arterial: 7.299 — ABNORMAL LOW (ref 7.350–7.450)
pO2, Arterial: 189 mmHg — ABNORMAL HIGH (ref 83.0–108.0)

## 2019-06-20 LAB — COMPREHENSIVE METABOLIC PANEL
ALT: 21 U/L (ref 0–44)
AST: 35 U/L (ref 15–41)
Albumin: 1.3 g/dL — ABNORMAL LOW (ref 3.5–5.0)
Alkaline Phosphatase: 96 U/L (ref 38–126)
Anion gap: 11 (ref 5–15)
BUN: 33 mg/dL — ABNORMAL HIGH (ref 6–20)
CO2: 27 mmol/L (ref 22–32)
Calcium: 8.1 mg/dL — ABNORMAL LOW (ref 8.9–10.3)
Chloride: 104 mmol/L (ref 98–111)
Creatinine, Ser: 0.86 mg/dL (ref 0.44–1.00)
GFR calc Af Amer: >60 mL/min (ref >60)
GFR calc non Af Amer: >60 mL/min (ref >60)
Glucose, Bld: 114 mg/dL — ABNORMAL HIGH (ref 70–99)
Potassium: 5.6 mmol/L — ABNORMAL HIGH (ref 3.5–5.1)
Sodium: 142 mmol/L (ref 135–145)
Total Bilirubin: 0.7 mg/dL (ref 0.3–1.2)
Total Protein: 5.2 g/dL — ABNORMAL LOW (ref 6.5–8.1)

## 2019-06-20 LAB — MAGNESIUM: Magnesium: 1.8 mg/dL (ref 1.7–2.4)

## 2019-06-20 IMAGING — MR MRI HEAD WITHOUT AND WITH CONTRAST
14 of 16 series · 42 of 48 positions shown · IV contrast (agent unspecified)
Comparison: CT head [DATE]

CLINICAL DATA: Altered level of consciousness.  IV drug abuse.

EXAM:
MRI HEAD WITHOUT AND WITH CONTRAST
TECHNIQUE: Multiplanar, multiecho pulse sequences of the brain and surrounding
structures were obtained without and with intravenous contrast.
CONTRAST:  6 mL Gadovist IV

[Series 5: DWI · axial · 3.0mm · 0.92mm/px · z∈[-70,+72]mm · 7 of 104 slices shown (1 of 4)]
[im 1/104]
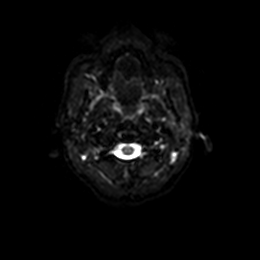
[im 18/104]
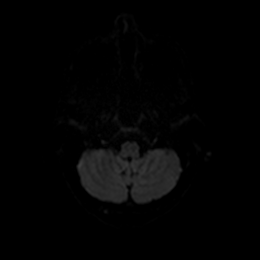
[im 35/104]
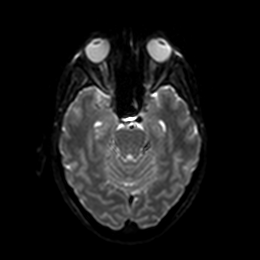
[im 52/104]
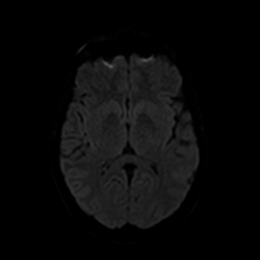
[im 69/104]
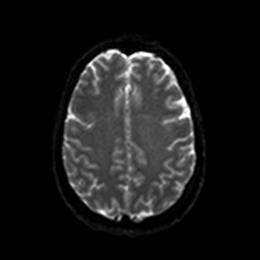
[im 86/104]
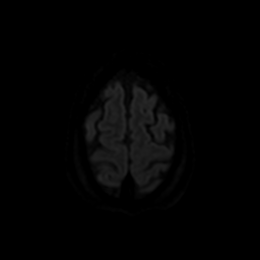
[im 104/104]
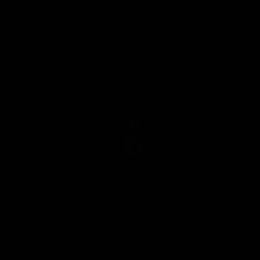

[Series 6: DWI · axial · 3.0mm · 0.92mm/px · z∈[-70,+72]mm · 3 of 52 slices shown (2 of 4)]
[im 1/52]
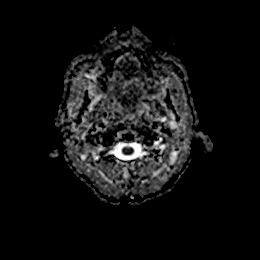
[im 26/52]
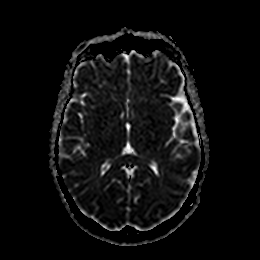
[im 52/52]
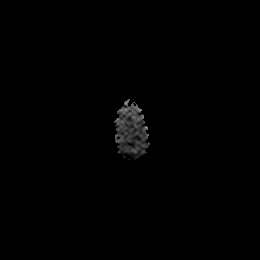

[Series 7: DWI · coronal · 4.0mm · 0.88mm/px · 5 of 78 slices shown (3 of 4)]
[im 1/78]
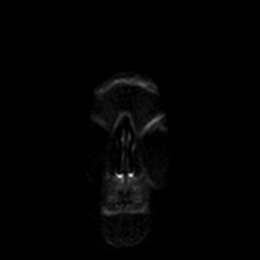
[im 20/78]
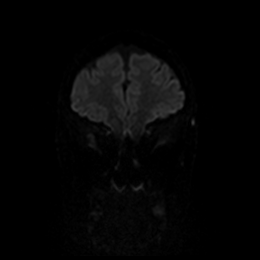
[im 39/78]
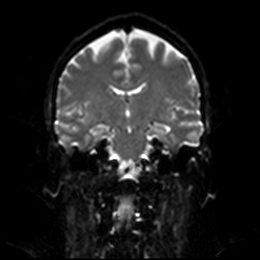
[im 58/78]
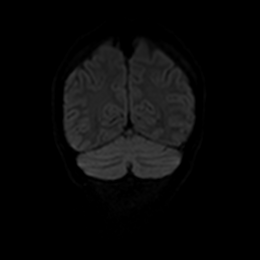
[im 78/78]
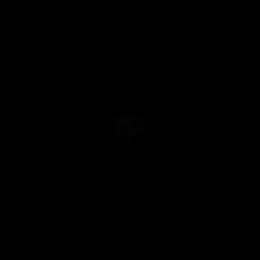

[Series 8: DWI · coronal · 4.0mm · 0.88mm/px · 3 of 39 slices shown (4 of 4)]
[im 1/39]
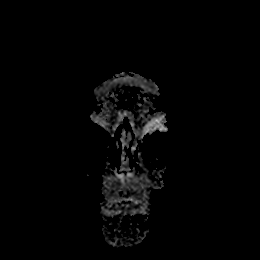
[im 20/39]
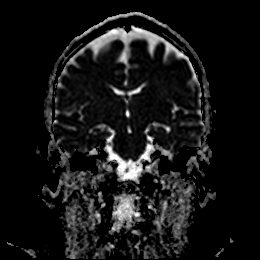
[im 39/39]
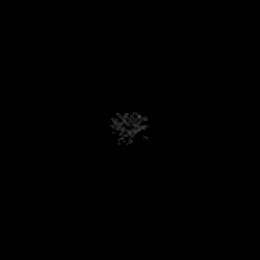

[Series 9: FLAIR · axial · 5.0mm · 0.45mm/px · z∈[-68,+71]mm · 2 of 26 slices shown]
[im 1/26]
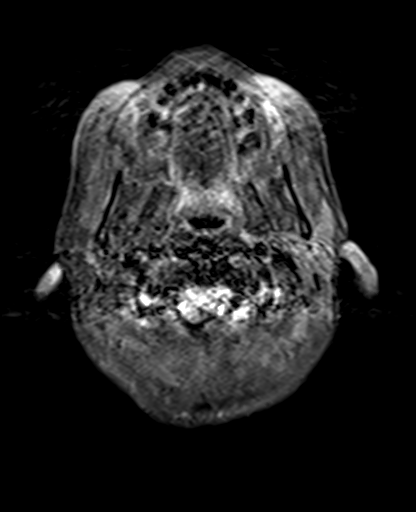
[im 26/26]
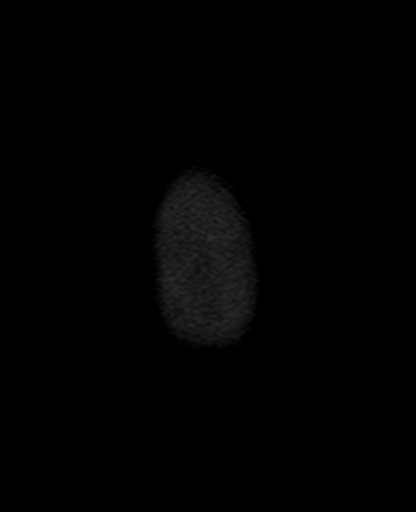

[Series 10: mag_images · axial · 3.0mm · 0.90mm/px · z∈[-69,+73]mm · 3 of 52 slices shown]
[im 1/52]
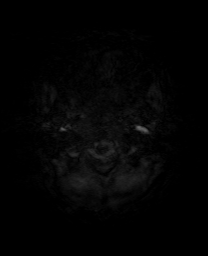
[im 26/52]
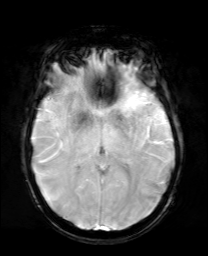
[im 52/52]
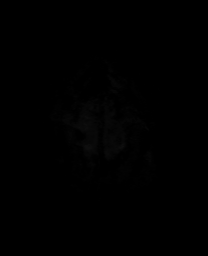

[Series 11: pha_images · axial · 3.0mm · 0.90mm/px · z∈[-69,+73]mm · 3 of 51 slices shown]
[im 1/51]
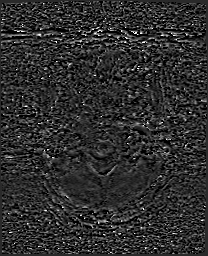
[im 26/51]
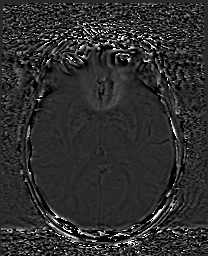
[im 51/51]
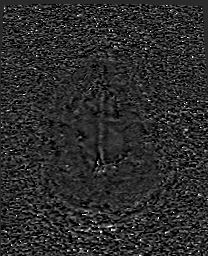

[Series 12: swi_images · axial · 3.0mm · 0.90mm/px · z∈[-69,+73]mm · 3 of 52 slices shown]
[im 1/52]
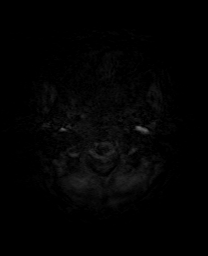
[im 26/52]
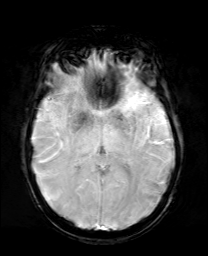
[im 52/52]
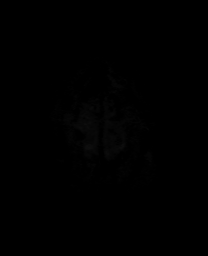

[Series 13: mip_images(sw) · axial · 24.0mm · 0.90mm/px · z∈[-60,+63]mm · 3 of 45 slices shown]
[im 1/45]
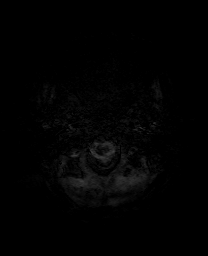
[im 23/45]
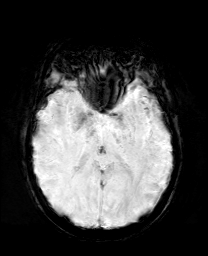
[im 45/45]
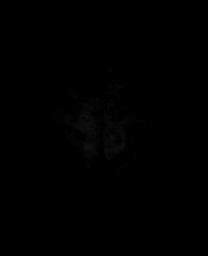

[Series 14: T1 · sagittal · 5.0mm · 0.75mm/px · 2 of 25 slices shown]
[im 1/25]
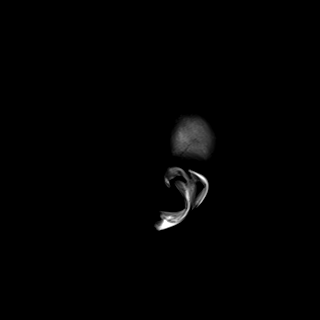
[im 25/25]
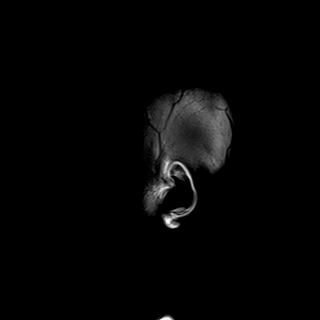

[Series 15: T2 · axial · 5.0mm · 0.72mm/px · z∈[-67,+72]mm · 2 of 26 slices shown]
[im 1/26]
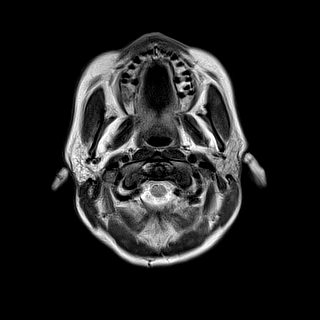
[im 26/26]
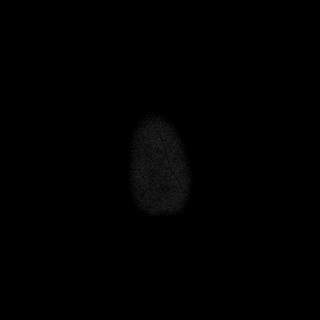

[Series 17: T2 post-contrast · coronal · 5.0mm · 0.72mm/px · 2 of 32 slices shown]
[im 1/32]
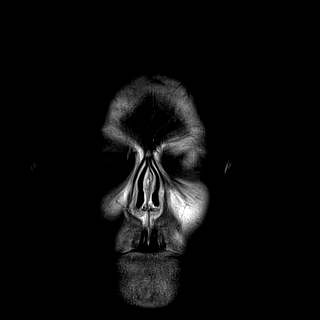
[im 32/32]
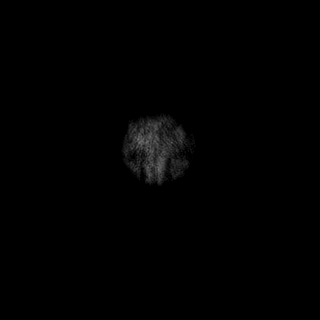

[Series 19: T1 post-contrast · coronal · 5.0mm · 0.43mm/px · 2 of 32 slices shown (1 of 2)]
[im 1/32]
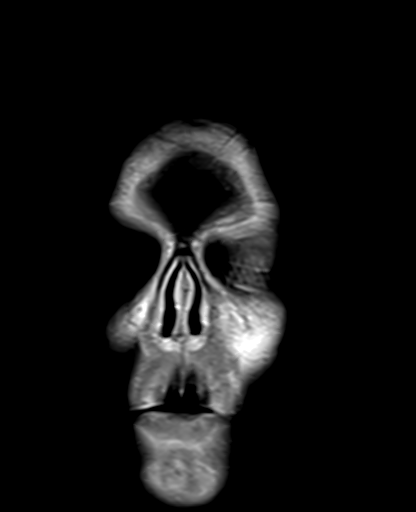
[im 32/32]
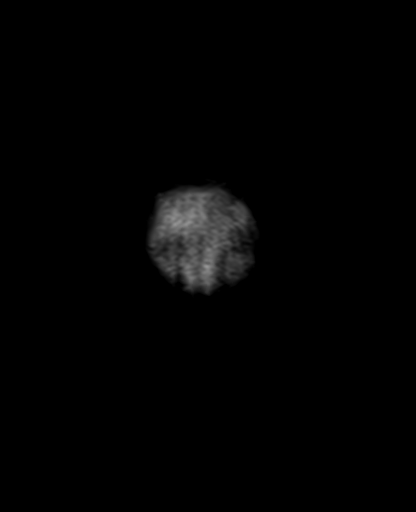

[Series 20: T1 post-contrast · sagittal · 5.0mm · 0.75mm/px · 2 of 25 slices shown (2 of 2)]
[im 1/25]
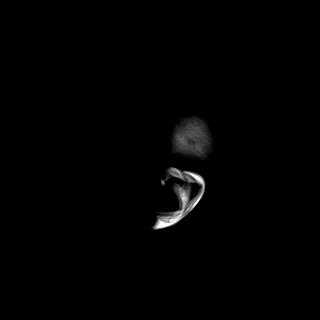
[im 25/25]
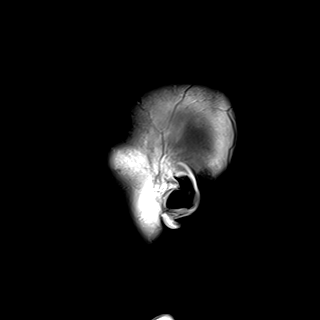

[42 of 48 positions shown; findings below may reference images not displayed]

FINDINGS: Brain: No acute infarction, hemorrhage, hydrocephalus, extra-axial
collection or mass lesion. Small benign-appearing cyst in the left
temporal horn.

Normal enhancement postcontrast infusion.

Vascular: Normal arterial flow voids

Skull and upper cervical spine: Negative

Sinuses/Orbits: Mild mucosal edema paranasal sinuses.  Normal orbit.

Other: None
IMPRESSION: Negative MRI head with contrast.

## 2019-06-20 IMAGING — DX PORTABLE CHEST - 1 VIEW
1 series · 1 of 1 positions shown · non-contrast
Comparison: [DATE] chest radiograph.

CLINICAL DATA: Oxygen desaturation

EXAM:
PORTABLE CHEST 1 VIEW

[chest ap]
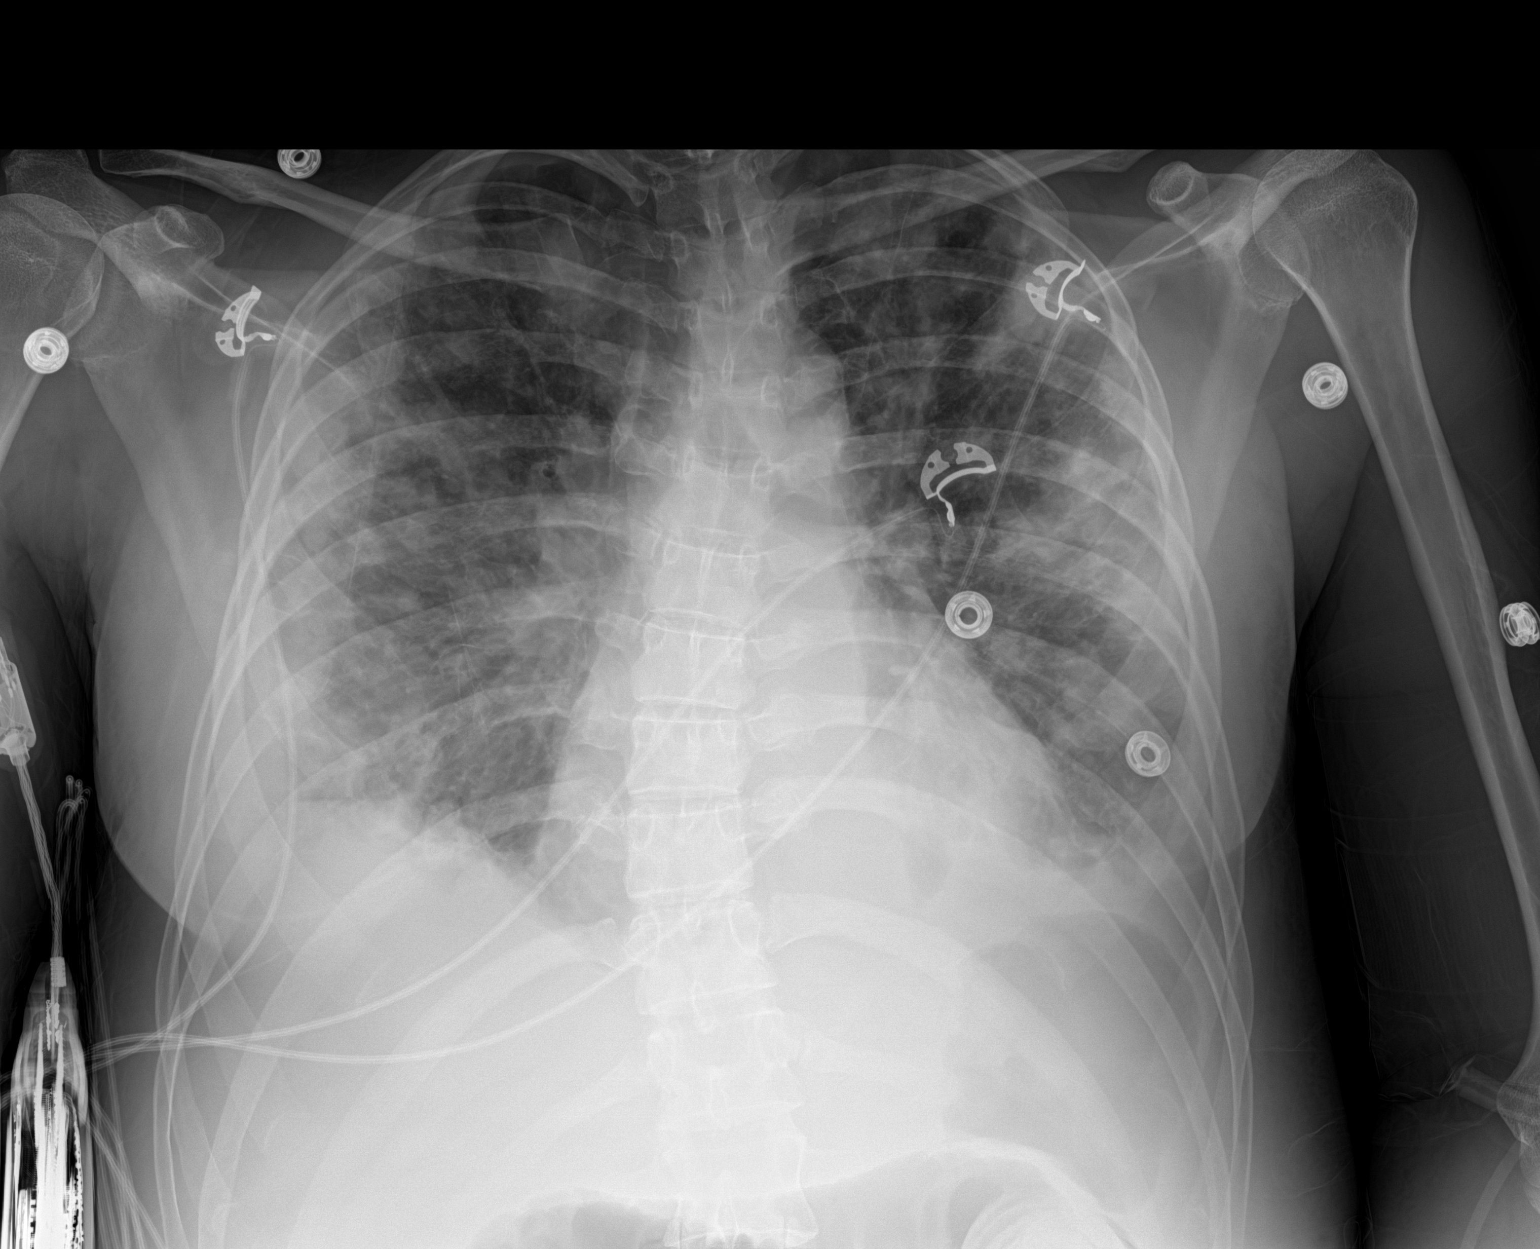

[1 of 1 positions shown; findings below may reference images not displayed]

FINDINGS: Stable cardiomediastinal silhouette with normal heart size. No
pneumothorax. Probable small bilateral pleural effusions, increased.
Severe patchy nodular opacities throughout both lungs, most
prominent in the peripheral lungs, worsened bilaterally.
IMPRESSION: 1. Worsening of severe patchy nodular bilateral lung opacities, most
prominent in the peripheral lungs. Multilobar pneumonia suspected.
2. Probable small increased bilateral pleural effusions.

## 2019-06-20 MED ORDER — METOPROLOL TARTRATE 5 MG/5ML IV SOLN: 5.0000 mg | Freq: Four times a day (QID) | INTRAVENOUS | Status: DC | PRN

## 2019-06-20 MED ORDER — MAGNESIUM SULFATE 2 GM/50ML IV SOLN
2.0000 g | Freq: Once | INTRAVENOUS | Status: AC
  Administered 2019-06-20: 2 g via INTRAVENOUS
  Filled 2019-06-20: qty 50

## 2019-06-20 MED ORDER — SODIUM POLYSTYRENE SULFONATE 15 GM/60ML PO SUSP
30.0000 g | Freq: Once | ORAL | Status: AC
  Administered 2019-06-20: 30 g via ORAL
  Filled 2019-06-20: qty 120

## 2019-06-20 MED ORDER — GADOBUTROL 1 MMOL/ML IV SOLN
6.0000 mL | Freq: Once | INTRAVENOUS | Status: AC | PRN
  Administered 2019-06-20: 6 mL via INTRAVENOUS

## 2019-06-20 MED ORDER — SODIUM ZIRCONIUM CYCLOSILICATE 5 G PO PACK
5.0000 g | PACK | Freq: Once | ORAL | Status: AC
  Administered 2019-06-20: 5 g via ORAL
  Filled 2019-06-20: qty 1

## 2019-06-20 NOTE — Progress Notes (Signed)
RT called to patient's room, after ABG, to start BiPAP.  Patient placed on BiPAP 15/5, rate 8, FIO2 50% per MD.  RT transport patient to ICU and gave report to receiving RT.

## 2019-06-20 NOTE — Progress Notes (Signed)
Pt was incontinent of urine early this morning. Was a large amount.  Idolina Primer, RN

## 2019-06-20 NOTE — Progress Notes (Signed)
Pt transferred to [facility] accompanied by floor RN and MD. On Bipap, sats upper 90's, VSS, tolerating well.

## 2019-06-20 NOTE — Progress Notes (Signed)
ABG results reported to [facility].  No new orders at this time

## 2019-06-20 NOTE — Progress Notes (Signed)
I entered the patient's room after telemetry called that her saturation levels went down to 82%.  I found patient's unresponsive, shook patient and called her name and no response.  Patient was drooling and pale, checked for pulse and was not able to find one.  Called code and was getting ready to start CPR, when patient said "ouch" and open her eyes.  Although patient was very lethargic, unable to answer questions, place patient on a non-rebreather and code was canceled.  Internal medicine providers came to the bedside to evaluate patient.  I will keep monitoring patient.

## 2019-06-20 NOTE — Progress Notes (Signed)
Due to patient's somnolence, ordered abg that showed ph 7.2, pco2 77.1. Due to hypercarbia being a possible cause of her hypercarbia.   Evaluated patient at bedside, she was interactive with providers and able to answer questions. She was very anxious about being on bipap or needing to be intubated. Called Dr. Chase Caller (critical care) who recommended icu transfer for monitoring.   Lars Mage, MD Internal Medicine PGY2 Pager:[phone] 06/20/2019, 11:07 AM

## 2019-06-20 NOTE — Progress Notes (Signed)
Notified MD on call with Internal Medicine with ABG results.  Saddie Benders RN

## 2019-06-20 NOTE — Progress Notes (Addendum)
Paged for patient having episodes of sinus tachycardia to the 140s. In the room patient stated she had no chest pain, shortness of breath, or other pain. She appeared groggy but per nursing and chart review this has been her baseline and is orientedx3. She is on methadone without other opioid pain medications today. She fell asleep during exam and brady'ed down to 40 for less than one minute and resolved spontaneously. This occurred yesterday as well. She also developed hyperkalemia yesterday, which was treated. O2 saturation was around 92% on 2LNC. Blood pressure is wnl.   EKG was done and showed irregular sinus tachycardia while review of telemetry showed possible multifocal p waves. On physical exam her lungs were without crackles, rales or wheezing. Tachycardia with irregular rhythm, and LE 3+ pitting edema. She is making urine. She has had a slow trend down of her hemoglobin over the past several days without clear etiology. She is currently being treated for MRSA bacteremia and had no vegetations on TEE yesterday.   - f/u stat cbc, bmp, mg  - hold IV fluids  - continue to monitor

## 2019-06-20 NOTE — Consult Note (Signed)
NAME:  Monica Stephens, MRN:  [id number], DOB:  1981/08/24, LOS: 6 ADMISSION DATE:  06/14/2019, CONSULTATION DATE:  6/20 REFERRING MD: Dr/ Maricela Bo    CHIEF COMPLAINT:  Acute hypercarbic Resp Failure   Brief History   38 year old female active smoker with known polysubstance abuse, IV drug user admitted June 14, 2019 with chest pain found to have MRSA bacteremia with bilateral septic pulmonary emboli .  Early June 20, 2019 patient with increased somnolence and unresponsive episode questionable PEA with brief compressions and regain of consciousness.  Patient was placed on BiPAP and transferred to ICU .  PCCM consulted  History of present illness   38 year old female active smoker with known polysubstance abuse, IV drug user.  Patient was admitted June 14, 2019 with chest pain found to have MRSA bacteremia, bilateral septic emboli.  Patient has a history of IV drug use prior to admission use heroin 2 days prior.  Had reported 3 weeks prior to admission she had used heroin and passed out with her friends doing CPR briefly.  Patient reports she did not go to the emergency room.  On arrival to the ER, CT chest showed sternal fracture with 1 bone anterior displacement, bilateral septic pulmonary emboli, severe pulmonary infection, pericardial effusion, low-density edema/gas along the pectoralis muscle concerning for myositis.  And right pleural effusion blood cultures were positive for MRSA.  TEE 617 showed no evidence of vegetation on any of her heart valves.  EF was 60 to 65% with trivial pericardial effusion.  She was consulted by infectious disease.  Started on aggressive IV antibiotics.  Vancomycin  MRI of the spine showed no evidence of discitis or osteomyelitis.  Did show multiple tiny septic emboli along the paraspinal muscles.  Enhancing focus along the left ilium felt to possibly be a bony septic embolus.  She was seen by thoracic surgery for right loculated pleural effusion.  Felt to be worrisome for  possible early empyema.  To be a poor candidate for thoracotomy or surgical drainage.  Recommend to continue on antibiotics and reevaluate in a few days.  Early 06/20/2019 patient with increased lethargy and somnolence.  Unresponsive episode around 05 50 possible PEA brief compressions were started.  Patient woke up patient was placed on nonrebreather. . Continued to have increased lethargy.  And hypoxemia ABG showed pH of 7.2 PCO2 of 77. Methadone, Dilaudid were discontinued.  patient was placed on BiPAP and transferred to ICU.  PCCM was consulted.    Past Medical History  Bipolar disorder, depression, thyroid cancer, polysubstance abuse, PTSD, status post cholecystectomy, status post thyroidectomy  Significant Hospital Events   Admitted June 14, 2019 with MRSA bacteremia and septic emboli  Consults:  ID  Card CTS   Procedures:  TEE 6/17   Significant Diagnostic Tests:  TEE 6/17 >> negative for endocarditis, negative PFO, EF 6065%, trivial pericardial effusion  Micro Data:  6/14Blood culture x2> MRSA  Antimicrobials:  6/14 Vanc   Interim history/subjective:  Transferred to ICU.  Patient is alert following commands and responsive on BiPAP decreased work of breathing O2 saturations adequate.  Objective   Blood pressure 105/71, pulse 84, temperature (!) 97.2 F (36.2 C), temperature source Axillary, resp. rate (!) 9, height 5\' 2"  (1.575 m), weight 61 kg, last menstrual period 06/13/2018, SpO2 100 %, currently breastfeeding.        Intake/Output Summary (Last 24 hours) at 06/20/2019 1154 Last data filed at 06/20/2019 0300 Gross per 24 hour  Intake 1744 ml  Output -  Net 1744 ml   Filed Weights   06/18/19 0521 06/19/19 0354 06/20/19 0500  Weight: 56.2 kg 59.5 kg 61 kg    Examination: General: Ill-appearing female on BiPAP HENT: Dry MM, AT  BiPAP Lungs: Coarse breath sounds, no wheezing Cardiovascular: RRR, no MRG Abdomen: Soft, positive bowel sounds, mildly distended  Extremities: Intact moves all extremities, 2+ edema Neuro: Alert and oriented, follows commands GU: Foley Skin: Scattered pustular lesions along the trunk  Resolved Hospital Problem list     Assessment & Plan:  Acute hypoxic and hypercarbic respiratory failure Right pleural effusion-CTS consult recs not surgical candidate currently , worry for develping empyema  SARS/COVID Neg  6/20 Chest x-ray worsening bilateral nodular opacities - Continued BIPAP support . Low threshold to intubate  -check ABG at 1400  -Check CXR in am  -Cont IV Vanc, consider expanding    Acute encephalopathy-most likely due to oversedation/hypercarbia -improved greatly on BIPAP  Polysubstance abuse/IVDU-Heroin  MRI brain 6/20- -limit sedating rx  -monitor closely   MRSA bacteremia Septic emboli -remains afebrile, wbc tr down  -cont IV abx with Vanc    Tachycardia  TEE 6/17  neg endocarditis , EF preserved.  -Cards consult 6/20  -metoprolol prn  Anemia  -tr cbc    Best practice:  Diet: NPO  Pain/Anxiety/Delirium protocol (if indicated): na VAP protocol (if indicated): na DVT prophylaxis: HEP SQ GI prophylaxis: na Glucose control: na Mobility: BR  Code Status: Full  Family Communication:  Disposition: ICU   Labs   CBC: Recent Labs  Lab 06/14/19 1036  06/15/19 0449 06/17/19 0430 06/18/19 1012 06/19/19 0503 06/20/19 0055  WBC 14.5*   < > 9.6 8.3 10.8* 7.1 11.2*  NEUTROABS 13.7*  --   --   --   --   --   --   HGB 11.9*   < > 9.2* 8.1* 8.1* 7.5* 8.0*  HCT 34.6*   < > 26.1* 24.3* 25.6* 23.4* 25.6*  MCV 85.0   < > 83.1 87.7 91.1 92.5 93.8  PLT 269   < > 194 204 238 248 289   < > = values in this interval not displayed.    Basic Metabolic Panel: Recent Labs  Lab 06/17/19 0430  06/18/19 1713 06/18/19 1915 06/19/19 0503 06/19/19 1336 06/20/19 0055  NA 138   < > 136 138 141 141 141  K 4.4   < > 5.5* 5.8* 5.5* 4.7 4.7  CL 106   < > 103 106 108 109 107  CO2 24   < > 26 27 30  24 28   GLUCOSE 71   < > 100* 99 112* 100* 127*  BUN 41*   < > 39* 37* 36* 32* 29*  CREATININE 1.39*   < > 0.83 0.99 0.90 0.78 0.81  CALCIUM 8.0*   < > 8.2* 8.4* 8.0* 7.8* 8.1*  MG 2.0  --   --   --   --   --   --    < > = values in this interval not displayed.   GFR: Estimated Creatinine Clearance: 82.6 mL/min (by C-G formula based on SCr of 0.81 mg/dL). Recent Labs  Lab 06/14/19 1036 06/14/19 1231  06/17/19 0430 06/18/19 1012 06/19/19 0503 06/20/19 0055  WBC 14.5*  --    < > 8.3 10.8* 7.1 11.2*  LATICACIDVEN 2.6* 1.8  --   --   --   --   --    < > = values in this interval not  displayed.    Liver Function Tests: Recent Labs  Lab 06/14/19 1036 06/15/19 0449 06/18/19 1012  AST 59* 62* 33  ALT 29 26 21   ALKPHOS 297* 182* 94  BILITOT 1.1 1.3* 0.5  PROT 6.9 5.7* 5.1*  ALBUMIN 2.2* 1.7* 1.2*   No results for input(s): LIPASE, AMYLASE in the last 168 hours. No results for input(s): AMMONIA in the last 168 hours.  ABG    Component Value Date/Time   PHART 7.232 (L) 06/20/2019 1030   PCO2ART 77.1 (HH) 06/20/2019 1030   PO2ART 107 06/20/2019 1030   HCO3 31.3 (H) 06/20/2019 1030   O2SAT 97.0 06/20/2019 1030     Coagulation Profile: Recent Labs  Lab 06/14/19 1036  INR 1.3*    Cardiac Enzymes: Recent Labs  Lab 06/17/19 1522  CKTOTAL 72    HbA1C: No results found for: HGBA1C  CBG: No results for input(s): GLUCAP in the last 168 hours.  Review of Systems:    limited on BIPAP  + dyspnea , +gen pain + swelling      Past Medical History  She,  has a past medical history of Bipolar depression ([place]) (2011), Depression, Follicular thyroid cancer ([place]), GAD (generalized anxiety disorder) ("all my life"), H/O lymph node biopsy, Menorrhagia, MRSA (methicillin resistant Staphylococcus aureus), Post-surgical hypothyroidism (2008), PTSD (post-traumatic stress disorder), Septic pulmonary embolism ([place]) (06/2019), and Tobacco dependence.   Surgical History    Past  Surgical History:  Procedure Laterality Date  . CHOLECYSTECTOMY  2010  . TEE WITHOUT CARDIOVERSION N/A 06/17/2019   Procedure: TRANSESOPHAGEAL ECHOCARDIOGRAM (TEE);  Surgeon: Pixie Casino, MD;  Location: [facility];  Service: Cardiovascular;  Laterality: N/A;  . THYROIDECTOMY  9?2008   Total  . TRANSTHORACIC ECHOCARDIOGRAM  07/2009   NORMAL [facility] cardiology, Dr. Irish Lack)     Social History   reports that she has been smoking cigarettes. She has a 2.00 pack-year smoking history. She has never used smokeless tobacco. She reports current alcohol use of about 2.0 standard drinks of alcohol per week. She reports current drug use.   Family History   Her family history includes Alzheimer's disease in her paternal grandfather; Anxiety disorder in her sister; Arthritis in her maternal uncle; Cancer in her father, maternal grandmother, mother, and sister; Depression in her sister; Hepatitis C in her father; Hypertension in her maternal grandfather and maternal grandmother.   Allergies No Known Allergies   Home Medications  Prior to Admission medications   Medication Sig Start Date End Date Taking? Authorizing Provider  acetaminophen (TYLENOL) 500 MG tablet Take 1,000 mg by mouth every 6 (six) hours as needed for mild pain.   Yes [provider]  calcium carbonate (TUMS - DOSED IN MG ELEMENTAL CALCIUM) 500 MG chewable tablet Chew 2 tablets by mouth daily as needed for indigestion or heartburn.   Yes [provider]  ibuprofen (ADVIL) 200 MG tablet Take 200-400 mg by mouth every 6 (six) hours as needed (for pain).   Yes [provider]  cephALEXin (KEFLEX) 500 MG capsule Take 1 capsule (500 mg total) by mouth 4 (four) times daily. Patient not taking: Reported on 06/14/2019 08/25/15   Howard Pouch A, DO  FLUoxetine (PROZAC) 40 MG capsule Take 1 capsule (40 mg total) by mouth daily. Patient not taking: Reported on 06/14/2019 11/15/15   Howard Pouch A, DO  levothyroxine  (SYNTHROID, LEVOTHROID) 150 MCG tablet Take 150-225 mcg by mouth See admin instructions. Pt takes 150 mcg daily and 225 mcg on Mondays.  [provider]  LORazepam (ATIVAN) 1 MG tablet Take 1 tablet (1 mg total) by mouth 2 (two) times daily. Patient not taking: Reported on 06/14/2019 11/15/15   Kuneff, Renee A, DO  mirtazapine (REMERON SOL-TAB) 15 MG disintegrating tablet Take 1 tablet (15 mg total) by mouth at bedtime. Patient not taking: Reported on 06/14/2019 11/15/15   Kuneff, Renee A, DO  ondansetron (ZOFRAN) 4 MG tablet Take 1 tablet (4 mg total) by mouth every 6 (six) hours. Patient not taking: Reported on 06/14/2019 04/08/16   Ezequiel Essex, MD     Critical care time:      Donzel Romack NP-C  [facility]  [phone]  06/20/2019

## 2019-06-20 NOTE — Progress Notes (Signed)
[facility] for Infectious Disease    Date of Admission:  06/14/2019   Total days of antibiotics 7 vanco           ID: Monica Stephens is a 38 y.o. female with disseminated MRSA infection presenting with pleuritic chest pain found to have bacteremia  with septic pulmonary emboli, presumed right sided endocarditis but TTE/TEE negative Principal Problem:   MRSA bacteremia Active Problems:   Opioid overdose ([place])   Septic pulmonary embolism, bilateral    Suspected endocarditis   Active intravenous drug use   Severe opioid dependence ([facility])   Pericardial effusion   Rash/skin eruption    Subjective: Eventful 24hr. Had episode bradycardia, near code last night but mostly solomnent -found to be hypercarbnic on abg. Now in ICU with bipap. Did not receive excess opiates over last 24hr. Has been on maintenance 20mg  methadone now on hold. ecg did not suggest conduction delay per primary team. Had mri of brain which did not show embolic phenomenon.  Medications:  . busPIRone  10 mg Oral BID  . feeding supplement (ENSURE ENLIVE)  237 mL Oral BID BM  . heparin  5,000 Units Subcutaneous Q8H  . levothyroxine  150 mcg Oral Once per day on Sun Tue Wed Thu Fri Sat  . levothyroxine  225 mcg Oral Q Mon  . polyethylene glycol  17 g Oral Daily    Objective: Vital signs in last 24 hours: Temp:  [96.5 F (35.8 C)-97.8 F (36.6 C)] 97.2 F (36.2 C) (06/20 1100) Pulse Rate:  [71-97] 89 (06/20 1200) Resp:  [9-15] 12 (06/20 1200) BP: (98-135)/(57-72) 107/69 (06/20 1200) SpO2:  [94 %-100 %] 100 % (06/20 1200) Weight:  [61 kg] 61 kg (06/20 0500)  Physical Exam  Constitutional:  oriented to person, place, and time. appears well-developed and well-nourished. No distress.  HENT: [place]/AT, PERRLA, no scleral icterus Mouth/Throat: Oropharynx is clear and moist. No oropharyngeal exudate.  Cardiovascular: Normal rate, regular rhythm and normal heart sounds. Exam reveals no gallop and no friction rub.  No  murmur heard.  Pulmonary/Chest: wearing bipap.Effort normal and breath sounds normal. No respiratory distress.  has no wheezes.  Chest = coalesced rash, erythematous base improving Abdominal: Soft. Bowel sounds are normal.  exhibits no distension. There is no tenderness.  Lymphadenopathy: no cervical adenopathy. No axillary adenopathy Neurological: alert and oriented to person, place, and time.  Skin: Skin is warm and dry. No rash noted. No erythema.    Lab Results Recent Labs    06/20/19 0055 06/20/19 1120  WBC 11.2* 8.6  HGB 8.0* 7.8*  HCT 25.6* 25.4*  NA 141 142  K 4.7 5.6*  CL 107 104  CO2 28 27  BUN 29* 33*  CREATININE 0.81 0.86   Liver Panel Recent Labs    06/18/19 1012 06/20/19 1120  PROT 5.1* 5.2*  ALBUMIN 1.2* 1.3*  AST 33 35  ALT 21 21  ALKPHOS 94 96  BILITOT 0.5 0.7  BILIDIR 0.2  --   IBILI 0.3  --     Microbiology: 6/17 blood cx x 2 MRSA 6/20 blood cx pending Studies/Results: Mr Jeri Cos XT Contrast  Result Date: 06/20/2019 CLINICAL DATA:  Altered level of consciousness.  IV drug abuse. EXAM: MRI HEAD WITHOUT AND WITH CONTRAST TECHNIQUE: Multiplanar, multiecho pulse sequences of the brain and surrounding structures were obtained without and with intravenous contrast. CONTRAST:  6 mL Gadovist IV COMPARISON:  CT head 06/24/2018 FINDINGS: Brain: No acute infarction, hemorrhage, hydrocephalus,  extra-axial collection or mass lesion. Small benign-appearing cyst in the left temporal horn. Normal enhancement postcontrast infusion. Vascular: Normal arterial flow voids Skull and upper cervical spine: Negative Sinuses/Orbits: Mild mucosal edema paranasal sinuses.  Normal orbit. Other: None IMPRESSION: Negative MRI head with contrast. Electronically Signed   By: Franchot Gallo M.D.   On: 06/20/2019 10:25   Dg Chest Port 1 View  Result Date: 06/20/2019 CLINICAL DATA:  Oxygen desaturation EXAM: PORTABLE CHEST 1 VIEW COMPARISON:  06/14/2019 chest radiograph. FINDINGS:  Stable cardiomediastinal silhouette with normal heart size. No pneumothorax. Probable small bilateral pleural effusions, increased. Severe patchy nodular opacities throughout both lungs, most prominent in the peripheral lungs, worsened bilaterally. IMPRESSION: 1. Worsening of severe patchy nodular bilateral lung opacities, most prominent in the peripheral lungs. Multilobar pneumonia suspected. 2. Probable small increased bilateral pleural effusions. Electronically Signed   By: Ilona Sorrel M.D.   On: 06/20/2019 05:58     Assessment/Plan: MRSA bacteremia = would plan on continue to treat as complicated bacteremia at the least since continues to have persistent bacteremia. Awaiting to see if recent blood cx from today are negative. Plan to treat for minimum of 6 wk  hypercarbnic respiratory failure = etiology somewhat unclear. Currently on bipap to help correct and also have temporarily discontinued maintenance opiates  mrsa skin infection to chest = continue with vancomycin  [facility] Cell: [phone] Pager: [phone]  06/20/2019, 1:29 PM

## 2019-06-20 NOTE — Progress Notes (Signed)
OT Cancellation Note  Patient Details Name: Monica Stephens MRN: [id number] DOB: Mar 21, 1981   Cancelled Treatment:    Reason Eval/Treat Not Completed: Medical issues which prohibited therapy, pt decline in status and transferred to [facility].  Will follow acutely and initiate OT eval when appropriative and able.   Delight Stare, OT Acute Rehabilitation Services Pager [phone] Office [phone]    Delight Stare 06/20/2019, 12:35 PM

## 2019-06-20 NOTE — Progress Notes (Signed)
RT offered pt to go on BIPAP V60 and pt declined. Pt states she is feeling ok without it. Pt respiratory status stable at this time. RT will continue to monitor.

## 2019-06-20 NOTE — Progress Notes (Signed)
Pt order for BIPAP V60 is PRN at this time. Pt not in need of NIV at this moment, pt respiratory status is stable at this time. RT will continue to monitor.

## 2019-06-20 NOTE — Significant Event (Signed)
Rapid Response Event Note RN called for assistance for transfer. Abnormal ABG results. Overview: Time Called: 1058 Arrival Time: 1105 Event Type: Other (Comment)(abnormal ABG results, requesting assistance with transfer)  Initial Focused Assessment: On arrival pt supine in bed, skin warm and dry, alert and oriented x4, tearful due to fear of being intubated. Dr. Maricela Bo and Dr. Dareen Piano at bedside. RN requesting assistance with transfer. Per MD notes ABG ordered due to a code blue that occurred at 0500 this am and pt with ongoing somnolence. ABG 4.0/7.232/77.1/31.3  Interventions: SpO2 not picking up, placed a new pulse ox, sats 100% on Bipap. Assisted with transport only. No interventions by RRT.  Transferred to 2H22  Event Summary: Name of Physician Notified: Dr. Maricela Bo at (at bedside on arrival)  Name of Consulting Physician Notified: Dr. Chase Caller at (pta RRT)  Outcome: Transferred (Comment)(2h)     [place], Sela Hua
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
## 2019-06-20 NOTE — Progress Notes (Addendum)
Nurse called to room for patient have desaturation of O2 to the low 80s. Code blue called as patient was unresponsive with possible PEA as she could not detect a pulse, monitor showed HR 77. She went to begin compressions and and patient woke up but seemed more lethargic than usual.  On arrival to room patient was on non-rebreather. Appeared tired but denied symptoms of SOB, chest pain, or other pain. She was oriented. She was tachycardic to the 140s.  Bilateral lower lobe crackles on present on exam with 3+ pitting edema.  Before this she had one short episode of bradycardia with intermittent NRS and tachycardia on telemetry.CBC from earlier shows increased leukocytosis with other labs wnl. Mg added earlier not yet resulted.  Stat chest xray shows RLL fluid collection likely representative of developing empyema with diffuse opacity, possibly pulmonary edema. Albumin 6/18 1.2 likely leading to third spacing which may not be relieved with diuresis. Symptoms may also be secondary to further emboli to the brain and will consider CT vs. MRI.   - repeat EKG  - CMP, CBC, Mg  - VBG ordered

## 2019-06-20 NOTE — Progress Notes (Signed)
Subjective:  Patient was laying in bed. She is able to tell us her name, place, unable to tell us the year. She was perceferating on [place], [place]. She denied any fast heart rate, chets pain, shortness of breath, or feeling dizzy or light headedness.   Over the past day the patient has been having episodes of tachy and bradycardia. When seen by night team the patient was described to be comfortable, lethargic, and oriented x3. Later code blue was called due to an unresponsive episode during which time the patient was noted to be saturating in low 80s. The episode only lasted a few seconds.   Objective:   Vital signs in last 24 hours: Vitals:   06/19/19 1407 06/19/19 2333 06/20/19 0500 06/20/19 0619  BP: (!) 98/57 135/72  105/71  Pulse: 95 71  97  Resp: 15     Temp: 97.8 F (36.6 C)   (!) 96.5 F (35.8 C)  TempSrc: Oral   Oral  SpO2: 95% 94%  96%  Weight:   61 kg   Height:       Physical Exam  Constitutional: She appears lethargic. She appears toxic. She has a sickly appearance. She appears ill.  Tired appearing, laying in bed  HENT:  Head: Normocephalic and atraumatic.  Eyes: Conjunctivae are normal.  Neck: JVD (3cm above sternal angle) present.  Cardiovascular: Normal rate, regular rhythm and normal heart sounds.  No murmur heard. Respiratory: Effort normal and breath sounds normal. No respiratory distress. She has no wheezes.  GI: Soft. Bowel sounds are normal. She exhibits distension. There is abdominal tenderness (mild).  Musculoskeletal:        General: Edema (bilateral upper and lower extremity 1+) present.  Neurological: She appears lethargic.  Repeating words, oriented x2, very lethargic  Skin: Rash (over precordial area) noted. There is erythema.    Assessment/Plan:  Ms. Dysert is a 38 year old female withpolysubstance abuse including IVheroin, bipolar, history of thyroid cancer who presents with chest pain and dyspneaand found to have bilateral septic  pulmonary emboliwith severe pulmonary infection and MRSA bacteremia.  Acute hypercarbic respiratory failure  Patient found to have ph  7.232, pco2 77, po2 107. Patient has been somnolent throughout night and in morning. Chest xray showed worsening severe patchy nodular bilateral lung opacities.   Patient's hypercarbia is likely driven by worsening multifocal pneumonia and possibly from pain medication (dilaudid and methadone).   -stopped methadone and dilaudid  -transferred to ICU, pccm following, imts to resume care 6/21  Intermittent SVT with bradycardia  TEE without acute vegetations. Likely driven by underlying multifocal pneumonia. Dr. Debara Pickett has evaluated patient and recommended prn iv metoprolol if sustained and hemodynamically significant svt.     MRSA bacteremia w/ septic emboli to lungs, paraspinal muscles & ileum.  Probably hx of right sided endocarditis with no acute vegetations on TEE. sclesand ilium  BC 6/14MRSA positive BC 6/15 no growth 4 days [facility] 6/17 MRSA growing in 4/4 bottles  BC 6/20 pending   -continue vancomycin (day 7) cannot switch daptomycin as poor penetration  -MRI brain today 6/20 without emboli to brain  Right-sided pleural effusion concerning for early empyema  CT surgery consulted and thought that she would be a poor candidate for thoracotomy and surgical drainage due to diffuse pulmonary disease, mentation, low albumin.  Recommended follow-up CT in a few days to assess if she can get a percutaneous drainage done by IR.  -Repeat CT chest when patient is more stable  Acute normocytic anemia  Hb stable at 8 from 7.5 yesterday  Opioid Use Disorder: Constipation and abdominal distention: Mild ileus and moderate stool burden on abdominal Xray Has bowel movement with bowel regimen.  -Ct with bowel regimen -Continue with IV Dilaudid q6 h PRN -CtMethadone 20 mg BIDwill start switching to suboxone 6/18 -CtBuspirone 10 mg BIDfor anxiety  -CtHydroxyzine 25 mg TID PRNfor anxiety -PT/Ot eval and treat--> SNF  Hyperkalemia at 5.6  -kayexalate given  Vesicular rash improved Culture with WBC and rare gr positive cocci. Likley due to MRSA bacterima. -Varicella PCR is pending -Continue to monitor, pain medications as above  Dispo: Anticipated discharge when stable.   Lars Mage, MD 06/20/2019, 7:26 AM Pager: [phone]

## 2019-06-20 NOTE — Progress Notes (Signed)
Patient had two episodes of ST up in to the 150s to 160s non-sustained, asymptomatic.  Then patient's HR when up again sustaining in the 130s still asymptomatic.  MD was notified and EKG was done.  There were several episodes of bradycardia in the 40s and 50s, still asymptomatic.  Also desating into the mid 80s.  MD has seen and spoken with patient.  I will keep monitoring patient.

## 2019-06-21 ENCOUNTER — Inpatient Hospital Stay (HOSPITAL_COMMUNITY): Payer: Self-pay

## 2019-06-21 DIAGNOSIS — I495 Sick sinus syndrome: Secondary | ICD-10-CM

## 2019-06-21 DIAGNOSIS — R001 Bradycardia, unspecified: Secondary | ICD-10-CM

## 2019-06-21 LAB — MRSA PCR SCREENING: MRSA by PCR: NEGATIVE

## 2019-06-21 LAB — BASIC METABOLIC PANEL
Anion gap: 7 (ref 5–15)
BUN: 31 mg/dL — ABNORMAL HIGH (ref 6–20)
CO2: 30 mmol/L (ref 22–32)
Calcium: 8.2 mg/dL — ABNORMAL LOW (ref 8.9–10.3)
Chloride: 105 mmol/L (ref 98–111)
Creatinine, Ser: 0.76 mg/dL (ref 0.44–1.00)
GFR calc Af Amer: >60 mL/min (ref >60)
GFR calc non Af Amer: >60 mL/min (ref >60)
Glucose, Bld: 88 mg/dL (ref 70–99)
Potassium: 4.6 mmol/L (ref 3.5–5.1)
Sodium: 142 mmol/L (ref 135–145)

## 2019-06-21 LAB — HEPATIC FUNCTION PANEL
ALT: 20 U/L (ref 0–44)
AST: 33 U/L (ref 15–41)
Albumin: 1.3 g/dL — ABNORMAL LOW (ref 3.5–5.0)
Alkaline Phosphatase: 87 U/L (ref 38–126)
Bilirubin, Direct: 0.1 mg/dL (ref 0.0–0.2)
Indirect Bilirubin: 0.2 mg/dL — ABNORMAL LOW (ref 0.3–0.9)
Total Bilirubin: 0.3 mg/dL (ref 0.3–1.2)
Total Protein: 5.2 g/dL — ABNORMAL LOW (ref 6.5–8.1)

## 2019-06-21 LAB — CBC
HCT: 24.4 % — ABNORMAL LOW (ref 36.0–46.0)
Hemoglobin: 7.7 g/dL — ABNORMAL LOW (ref 12.0–15.0)
MCH: 29.7 pg (ref 26.0–34.0)
MCHC: 31.6 g/dL (ref 30.0–36.0)
MCV: 94.2 fL (ref 80.0–100.0)
Platelets: 274 10*3/uL (ref 150–400)
RBC: 2.59 MIL/uL — ABNORMAL LOW (ref 3.87–5.11)
RDW: 16.2 % — ABNORMAL HIGH (ref 11.5–15.5)
WBC: 9.3 10*3/uL (ref 4.0–10.5)
nRBC: 1 % — ABNORMAL HIGH (ref 0.0–0.2)

## 2019-06-21 LAB — PHOSPHORUS: Phosphorus: 3.2 mg/dL (ref 2.5–4.6)

## 2019-06-21 LAB — LACTIC ACID, PLASMA: Lactic Acid, Venous: 1.1 mmol/L (ref 0.5–1.9)

## 2019-06-21 LAB — MAGNESIUM: Magnesium: 2 mg/dL (ref 1.7–2.4)

## 2019-06-21 IMAGING — DX PORTABLE CHEST - 1 VIEW
1 series · 1 of 1 positions shown · non-contrast
Comparison: [DATE]

CLINICAL DATA: Respiratory failure.

EXAM:
PORTABLE CHEST 1 VIEW

[chest ap]
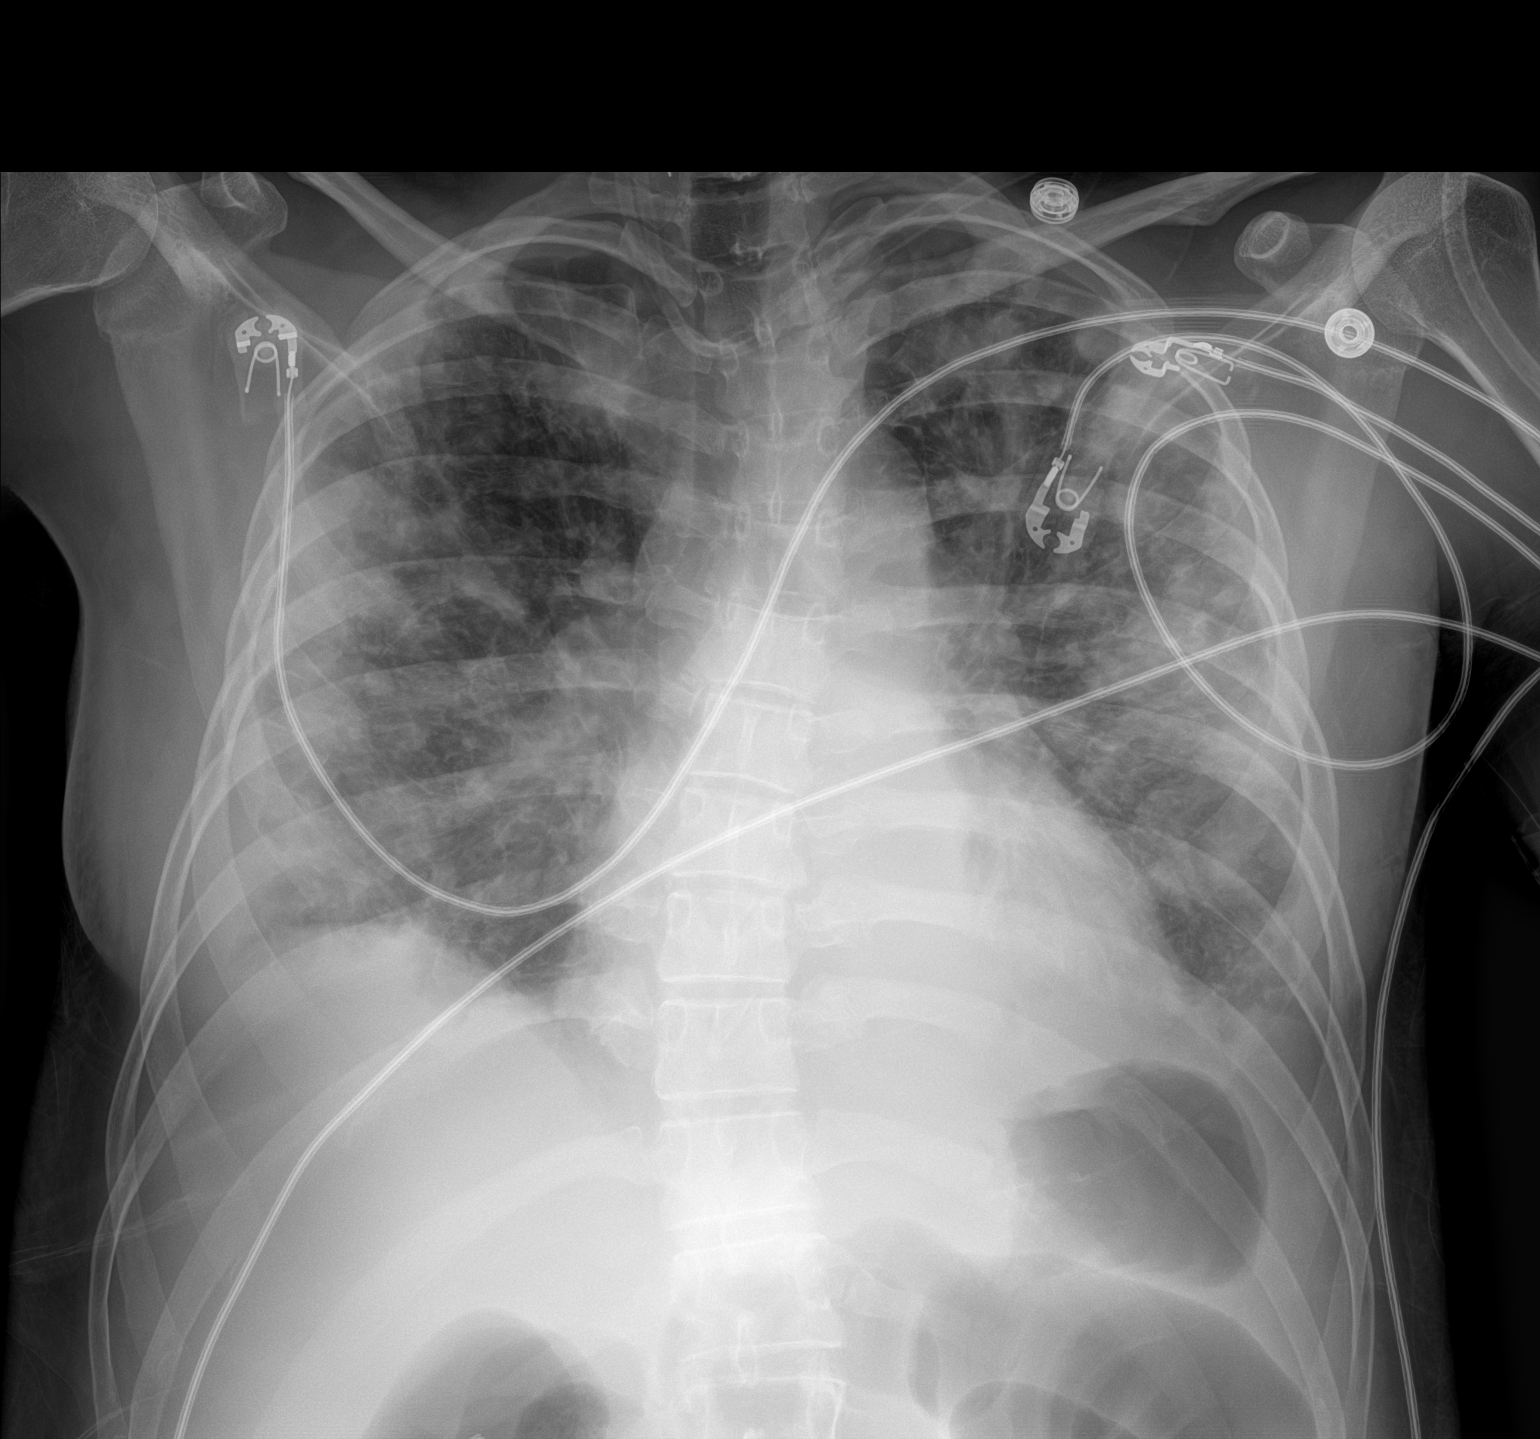

[1 of 1 positions shown; findings below may reference images not displayed]

FINDINGS: Lungs are adequately inflated demonstrate persistent bilateral
patchy mainly peripheral airspace process likely multifocal
pneumonia. Suggestion of small amount of bilateral pleural fluid
unchanged. Cardiomediastinal silhouette and remainder of the exam is
unchanged.
IMPRESSION: Stable bilateral patchy peripheral airspace process likely
multifocal pneumonia. Suggestion of small bilateral pleural
effusions unchanged.

## 2019-06-21 MED ORDER — DOCUSATE SODIUM 50 MG/5ML PO LIQD
50.0000 mg | Freq: Every day | ORAL | Status: DC
  Administered 2019-06-21: 14:00:00 50 mg via ORAL
  Filled 2019-06-21 (×3): qty 10

## 2019-06-21 MED ORDER — HYDROMORPHONE HCL 1 MG/ML IJ SOLN
0.5000 mg | Freq: Three times a day (TID) | INTRAMUSCULAR | Status: DC | PRN
  Administered 2019-06-21 – 2019-06-24 (×9): 0.5 mg via INTRAVENOUS
  Filled 2019-06-21: qty 1
  Filled 2019-06-21: qty 0.5
  Filled 2019-06-21 (×8): qty 1

## 2019-06-21 MED ORDER — MUPIROCIN CALCIUM 2 % EX CREA
TOPICAL_CREAM | Freq: Two times a day (BID) | CUTANEOUS | Status: AC
  Administered 2019-06-21: 22:00:00 via TOPICAL
  Administered 2019-06-22: 1 via TOPICAL
  Administered 2019-06-22: 10:00:00 via TOPICAL
  Administered 2019-06-23: 1 via TOPICAL
  Administered 2019-06-24 – 2019-06-27 (×7): via TOPICAL
  Filled 2019-06-21 (×2): qty 15

## 2019-06-21 NOTE — Progress Notes (Signed)
Patient's mother Sakara Lehtinen was informed that the patient has been transferred from room 2H22 to room 4E27.

## 2019-06-21 NOTE — Progress Notes (Signed)
Telephone SBAR report given to [facility] for room [phone].

## 2019-06-21 NOTE — Progress Notes (Addendum)
Patient transferred from room 2H22 to 4E27 via wheelchair. Patient was on 2L [place],  on telemetry and wearing a mask during transport. Nurse Tech was at the bedside to accept the patient. Pt transferred from wheelchair to bed. Telementry box applied and place on 2L  [place] in the room.

## 2019-06-21 NOTE — Consult Note (Signed)
[facility] Nurse wound consult note Reason for Consult: rash on chest with single pustule. Wound type: infectious Pressure Injury POA: NA Measurement: Affected area measures 7cm x 15cm and presents with erythema, purple discoloration and a single mall pustule (<25mm) filled with yellow liquid Wound bed:N/A Drainage (amount, consistency, odor) none Periwound: as described above Dressing procedure/placement/frequency: While this is slightly outside of [facility] practice, I can offer twice daily application of mupirocin cream to the affected area for 7 days. Is no improvement, please consult with outpatient Dermatology or ID physician.  [facility] nursing team will not follow, but will remain available to this patient, the nursing and medical teams.  Please re-consult if needed. Thanks, Maudie Flakes, MSN, RN, [place], Arther Abbott  Pager# [phone]

## 2019-06-21 NOTE — Progress Notes (Signed)
   Subjective:   Monica Stephens was seen  laying in her bed this morning she is appeared more alert today. She not stated that she had any pain.  She was able to interact with providers well.  Objective:  Vital signs in last 24 hours: Vitals:   06/21/19 0900 06/21/19 1000 06/21/19 1100 06/21/19 1200  BP: 103/65 109/68 102/74 103/72  Pulse: (!) 106 81 77 84  Resp: 20 18 16 16   Temp:      TempSrc:      SpO2: 96% 95% 95% 95%  Weight:      Height:       Physical Exam  Constitutional: She appears well-developed and well-nourished. No distress.  Slowed, slightly lethargic  HENT:  Head: Normocephalic and atraumatic.  Eyes: Conjunctivae are normal.  Cardiovascular: Normal rate, regular rhythm and normal heart sounds.  Respiratory: Effort normal and breath sounds normal. No respiratory distress. She has no wheezes.  GI: Soft. Bowel sounds are normal. She exhibits no distension. There is no abdominal tenderness.  Musculoskeletal:        General: No edema.  Neurological: She is alert.  Skin: She is not diaphoretic. There is erythema (over precordial region and abdomen that has improved.).  Psychiatric: She has a normal mood and affect. Thought content normal.   Assessment/Plan:  Monica Stephens is a 38 year old female withpolysubstance abuse including IVheroin, bipolar, history of thyroid cancer who presents with chest pain and dyspneaand found to have bilateral septic pulmonary emboliwith severe pulmonary infection and MRSA bacteremia.  Acute hypercarbic respiratory failure  Patient found to have ph  7.232, pco2 77, po2 107. Placed on bipap and appears more alert/less lethargic. Repeat abg collected 1445 6/20 but not resulted.   -transfer to progressive floor -place on bipap qhs  Intermittent SVT with bradycardia  TEE without acute vegetations. Likely driven by underlying multifocal pneumonia. Dr. Debara Pickett has evaluated patient and recommended prn iv metoprolol if sustained and  hemodynamically significant svt.     MRSA bacteremia w/ septic emboli to lungs, paraspinal muscles & ileum.  Probably hx of right sided endocarditis with no acute vegetations on TEE. BC 6/14MRSA positive BC 6/15 no growth 4 days BC 6/17MRSA growing in 4/4 bottles  [facility] 6/20 pending   -continue vancomycin (day 8)   Right-sided pleural effusion concerning for early empyema Will do repeat CT tomorrow 6/22 to determine if patient can get a percutaneous drainage done by IR.  Acute normocytic anemia  Hb stable 7.7  Opioid Use Disorder: Constipation and abdominal distention: Mild ileus and moderate stool burden on abdominal Xray  -Continue with IV Dilaudid q6 h PRN -stopped methadone 6/20 -Increased Buspirone 10 mg BIDto 15mg  bid -CtHydroxyzine 25 mg TID PRNfor anxiety -continue pt and ot   Hyperkalemia at 5.6 Resolved-4.6 today 6/21  Vesicular rash improved Culture with WBC and rare gr positive cocci. Likley due to MRSA bacteremia. -Varicella PCR is pending -Continue to monitor, pain medications as above  Dispo: Anticipated discharge once stable   Lars Mage, MD 06/21/2019, 12:19 PM Pager: [phone]

## 2019-06-21 NOTE — Progress Notes (Signed)
Progress Note  Patient Name: Monica Stephens Date of Encounter: 06/21/2019  Primary Cardiologist: Pixie Casino, MD   Subjective   Resting comfortably, eyes open in bed  Inpatient Medications    Scheduled Meds: . busPIRone  10 mg Oral BID  . feeding supplement (ENSURE ENLIVE)  237 mL Oral BID BM  . heparin  5,000 Units Subcutaneous Q8H  . levothyroxine  150 mcg Oral Once per day on Sun Tue Wed Thu Fri Sat  . levothyroxine  225 mcg Oral Q Mon  . polyethylene glycol  17 g Oral Daily   Continuous Infusions: . vancomycin 1,000 mg (06/21/19 0919)   PRN Meds: acetaminophen **OR** acetaminophen, hydrOXYzine, metoprolol tartrate, promethazine, senna-docusate   Vital Signs    Vitals:   06/21/19 0700 06/21/19 0800 06/21/19 0836 06/21/19 0900  BP: 110/79 120/69  103/65  Pulse: (!) 102 63  (!) 106  Resp: 19 14  20   Temp:   98.1 F (36.7 C)   TempSrc:   Oral   SpO2: 100% 95%  96%  Weight:      Height:        Intake/Output Summary (Last 24 hours) at 06/21/2019 0956 Last data filed at 06/21/2019 0919 Gross per 24 hour  Intake 1209.97 ml  Output 1750 ml  Net -540.03 ml   Last 3 Weights 06/21/2019 06/20/2019 06/19/2019  Weight (lbs) 129 lb 10.1 oz 134 lb 7.7 oz 131 lb 2.8 oz  Weight (kg) 58.8 kg 61 kg 59.5 kg      Telemetry    Sinus rhythm in the 80s with occasional bursts of 110 bpm- Personally Reviewed  ECG    No new- Personally Reviewed  Physical Exam   GEN: No acute distress.   Neck: No JVD Cardiac: RRR, no murmurs, rubs, or gallops.  Respiratory: Clear to auscultation bilaterally. GI: Soft, nontender, non-distended  MS: No edema; No deformity. Neuro:  Nonfocal  Psych: Depressed affect   Labs    Chemistry Recent Labs  Lab 06/18/19 1012  06/20/19 1120 06/20/19 1459 06/20/19 2127 06/21/19 0401  NA 139   < > 142 142 143 142  K 5.4*   < > 5.6* 4.9 4.6 4.6  CL 107   < > 104  --  106 105  CO2 26   < > 27  --  31 30  GLUCOSE 116*   < > 114*  --  89 88   BUN 39*   < > 33*  --  33* 31*  CREATININE 1.09*   < > 0.86  --  0.74 0.76  CALCIUM 8.4*   < > 8.1*  --  8.4* 8.2*  PROT 5.1*  --  5.2*  --   --  5.2*  ALBUMIN 1.2*  --  1.3*  --   --  1.3*  AST 33  --  35  --   --  33  ALT 21  --  21  --   --  20  ALKPHOS 94  --  96  --   --  87  BILITOT 0.5  --  0.7  --   --  0.3  GFRNONAA >60   < > >60  --  >60 >60  GFRAA >60   < > >60  --  >60 >60  ANIONGAP 6   < > 11  --  6 7   < > = values in this interval not displayed.     Hematology Recent Labs  Lab  06/20/19 0055 06/20/19 1120 06/20/19 1459 06/21/19 0401  WBC 11.2* 8.6  --  9.3  RBC 2.73* 2.67*  --  2.59*  HGB 8.0* 7.8* 7.1* 7.7*  HCT 25.6* 25.4* 21.0* 24.4*  MCV 93.8 95.1  --  94.2  MCH 29.3 29.2  --  29.7  MCHC 31.3 30.7  --  31.6  RDW 16.7* 16.7*  --  16.2*  PLT 289 280  --  274    Cardiac EnzymesNo results for input(s): TROPONINI in the last 168 hours. No results for input(s): TROPIPOC in the last 168 hours.   BNPNo results for input(s): BNP, PROBNP in the last 168 hours.   DDimer No results for input(s): DDIMER in the last 168 hours.   Radiology    Mr Jeri Cos Wo Contrast  Result Date: 06/20/2019 CLINICAL DATA:  Altered level of consciousness.  IV drug abuse. EXAM: MRI HEAD WITHOUT AND WITH CONTRAST TECHNIQUE: Multiplanar, multiecho pulse sequences of the brain and surrounding structures were obtained without and with intravenous contrast. CONTRAST:  6 mL Gadovist IV COMPARISON:  CT head 06/24/2018 FINDINGS: Brain: No acute infarction, hemorrhage, hydrocephalus, extra-axial collection or mass lesion. Small benign-appearing cyst in the left temporal horn. Normal enhancement postcontrast infusion. Vascular: Normal arterial flow voids Skull and upper cervical spine: Negative Sinuses/Orbits: Mild mucosal edema paranasal sinuses.  Normal orbit. Other: None IMPRESSION: Negative MRI head with contrast. Electronically Signed   By: Franchot Gallo M.D.   On: 06/20/2019 10:25   Dg  Chest Port 1 View  Result Date: 06/21/2019 CLINICAL DATA:  Respiratory failure. EXAM: PORTABLE CHEST 1 VIEW COMPARISON:  06/20/2019 FINDINGS: Lungs are adequately inflated demonstrate persistent bilateral patchy mainly peripheral airspace process likely multifocal pneumonia. Suggestion of small amount of bilateral pleural fluid unchanged. Cardiomediastinal silhouette and remainder of the exam is unchanged. IMPRESSION: Stable bilateral patchy peripheral airspace process likely multifocal pneumonia. Suggestion of small bilateral pleural effusions unchanged. Electronically Signed   By: Marin Olp M.D.   On: 06/21/2019 08:44   Dg Chest Port 1 View  Result Date: 06/20/2019 CLINICAL DATA:  Oxygen desaturation EXAM: PORTABLE CHEST 1 VIEW COMPARISON:  06/14/2019 chest radiograph. FINDINGS: Stable cardiomediastinal silhouette with normal heart size. No pneumothorax. Probable small bilateral pleural effusions, increased. Severe patchy nodular opacities throughout both lungs, most prominent in the peripheral lungs, worsened bilaterally. IMPRESSION: 1. Worsening of severe patchy nodular bilateral lung opacities, most prominent in the peripheral lungs. Multilobar pneumonia suspected. 2. Probable small increased bilateral pleural effusions. Electronically Signed   By: Ilona Sorrel M.D.   On: 06/20/2019 05:58    Cardiac Studies   TEE normal EF no endocarditis no thrombus trivial nonclinical relevant pericardial effusion.  Patient Profile     38 y.o. female with IV drug use bipolar disorder opiate overdose multifocal pulmonary embolism with hypercarbic somnolent episode.  Telemetry demonstrated occasional bradycardia in the 30s as well as tachycardia 140s.  Most recent telemetry demonstrates improvement.  Assessment & Plan    Bradycardia - Hypersomnolence secondary to CO2 narcosis.  Can occur with respiratory variation.  No indication for pacemaker  SVT - No further significant episodes on telemetry now  that metabolic/respiratory derangement is improved -Okay to use as needed IV metoprolol if needed - Occasional jumps in heart rate from 80-110 with clear P waves preceding with no significant morphologic change in P wave.  Could be brief episodes of paroxysmal atrial tachycardia perhaps.  Overall, conservative management.    [facility] will sign off.   Medication  Recommendations:  No new Other recommendations (labs, testing, etc):  none Follow up as an outpatient:  none  For questions or updates, please contact [facility] Please consult [email or web address] for contact info under        Signed, Candee Furbish, MD  06/21/2019, 9:56 AM

## 2019-06-22 ENCOUNTER — Inpatient Hospital Stay (HOSPITAL_COMMUNITY): Payer: Self-pay

## 2019-06-22 DIAGNOSIS — F1123 Opioid dependence with withdrawal: Secondary | ICD-10-CM

## 2019-06-22 DIAGNOSIS — J9602 Acute respiratory failure with hypercapnia: Secondary | ICD-10-CM

## 2019-06-22 DIAGNOSIS — J90 Pleural effusion, not elsewhere classified: Secondary | ICD-10-CM

## 2019-06-22 LAB — CBC
HCT: 26.6 % — ABNORMAL LOW (ref 36.0–46.0)
Hemoglobin: 8.4 g/dL — ABNORMAL LOW (ref 12.0–15.0)
MCH: 29 pg (ref 26.0–34.0)
MCHC: 31.6 g/dL (ref 30.0–36.0)
MCV: 91.7 fL (ref 80.0–100.0)
Platelets: 310 10*3/uL (ref 150–400)
RBC: 2.9 MIL/uL — ABNORMAL LOW (ref 3.87–5.11)
RDW: 16 % — ABNORMAL HIGH (ref 11.5–15.5)
WBC: 11.1 10*3/uL — ABNORMAL HIGH (ref 4.0–10.5)
nRBC: 1.3 % — ABNORMAL HIGH (ref 0.0–0.2)

## 2019-06-22 LAB — BASIC METABOLIC PANEL
Anion gap: 6 (ref 5–15)
BUN: 34 mg/dL — ABNORMAL HIGH (ref 6–20)
CO2: 30 mmol/L (ref 22–32)
Calcium: 7.7 mg/dL — ABNORMAL LOW (ref 8.9–10.3)
Chloride: 103 mmol/L (ref 98–111)
Creatinine, Ser: 0.74 mg/dL (ref 0.44–1.00)
GFR calc Af Amer: >60 mL/min (ref >60)
GFR calc non Af Amer: >60 mL/min (ref >60)
Glucose, Bld: 92 mg/dL (ref 70–99)
Potassium: 4.5 mmol/L (ref 3.5–5.1)
Sodium: 139 mmol/L (ref 135–145)

## 2019-06-22 IMAGING — CT CT CHEST WITHOUT CONTRAST
2 of 4 series · 15 of 36 positions shown, 18 images · non-contrast
Comparison: [DATE]

CLINICAL DATA: Pleural effusion. Empyema suspected. MRSA bacteremia
and septic pulmonary embolism.

EXAM:
CT CHEST WITHOUT CONTRAST
TECHNIQUE: Multidetector CT imaging of the chest was performed following the
standard protocol without IV contrast.

[Series 3: chest w/o 2mm st · axial · non-contrast · 0.72mm/px · z∈[+1094,+1374]mm · 12 of 164 slices shown, 15 images]
[im 12/164  mediastinal]
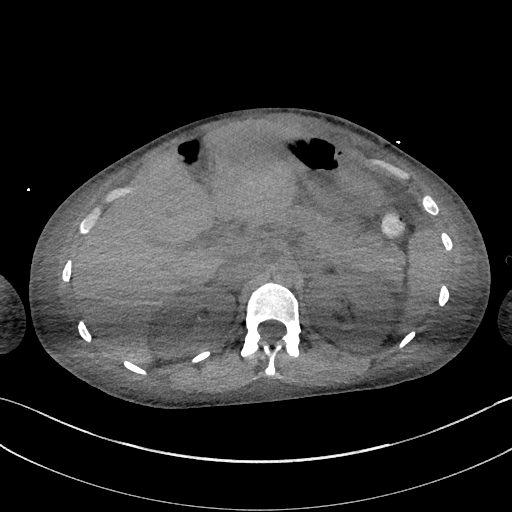
[im 12/164  lung]
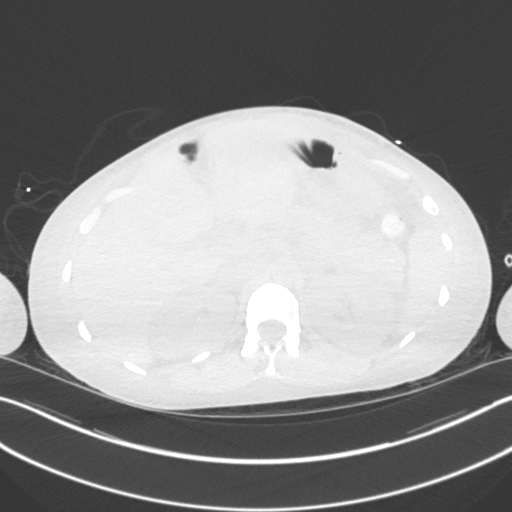
[im 24/164  lung]
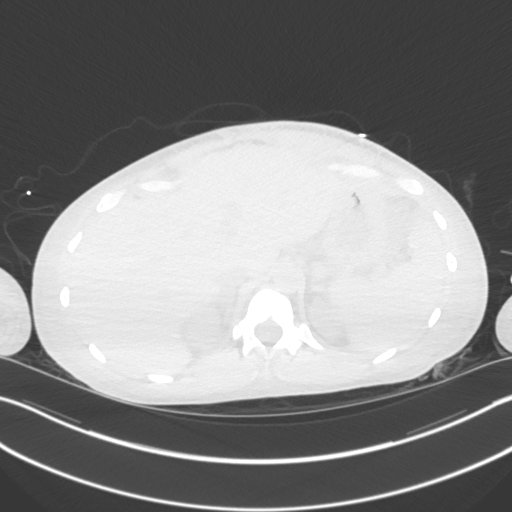
[im 35/164  lung]
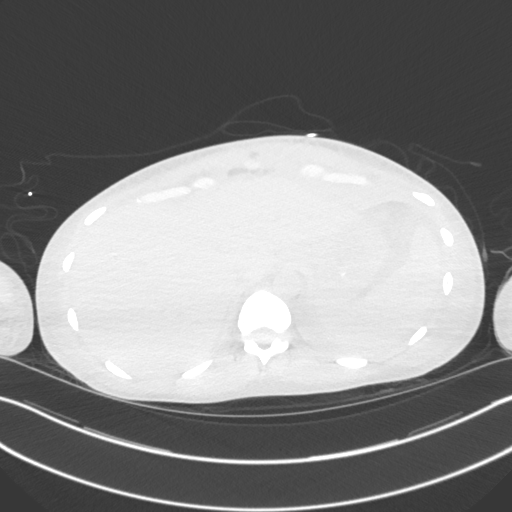
[im 47/164  lung]
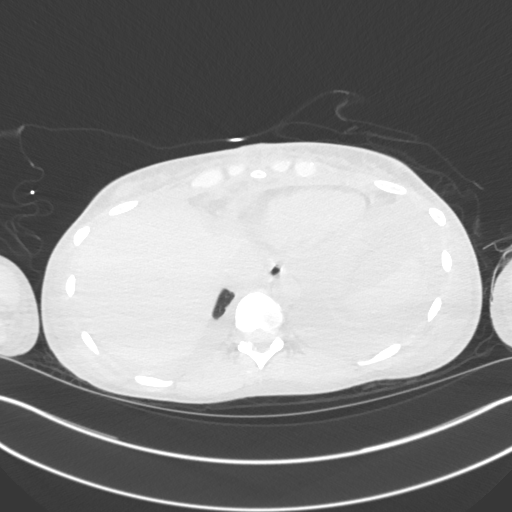
[im 59/164  mediastinal]
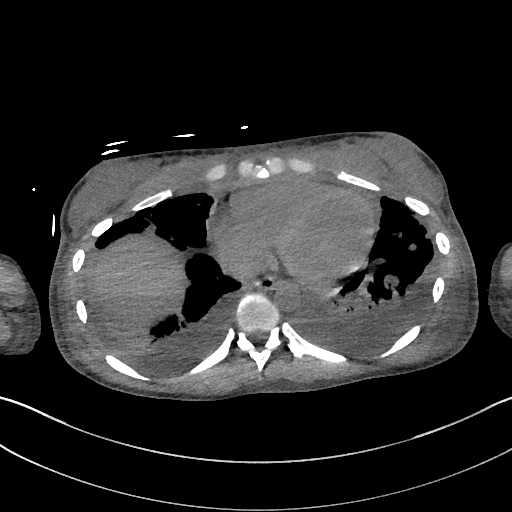
[im 59/164  lung]
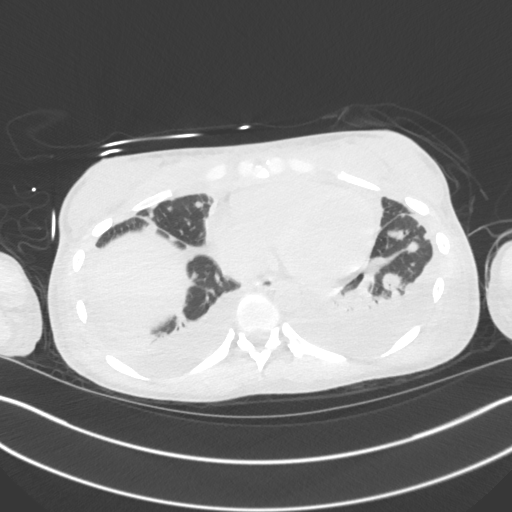
[im 70/164  lung]
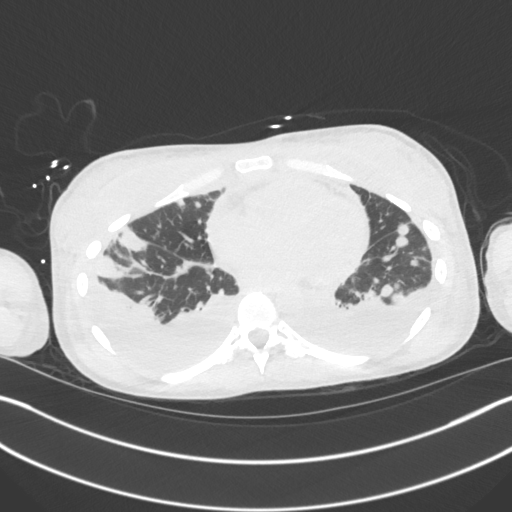
[im 94/164  lung]
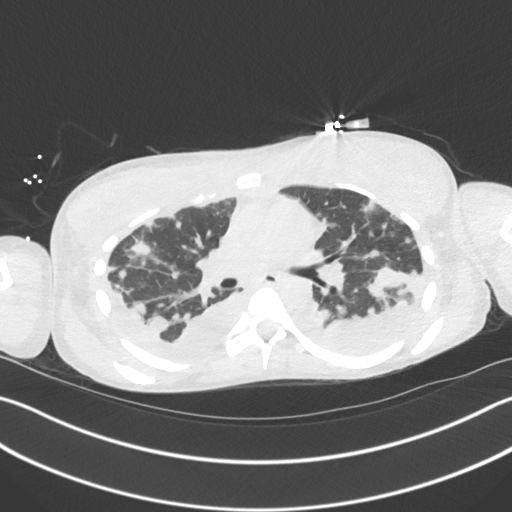
[im 105/164  lung]
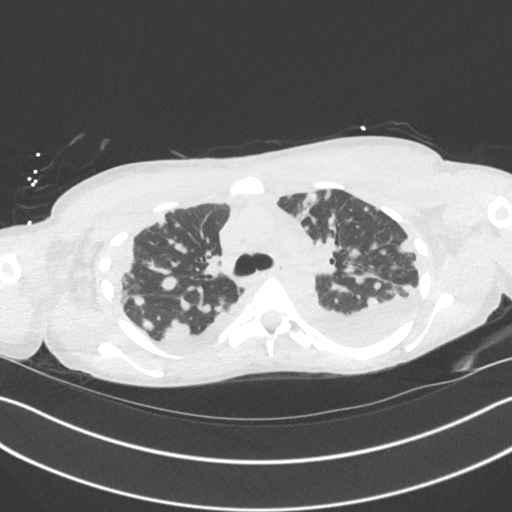
[im 117/164  mediastinal]
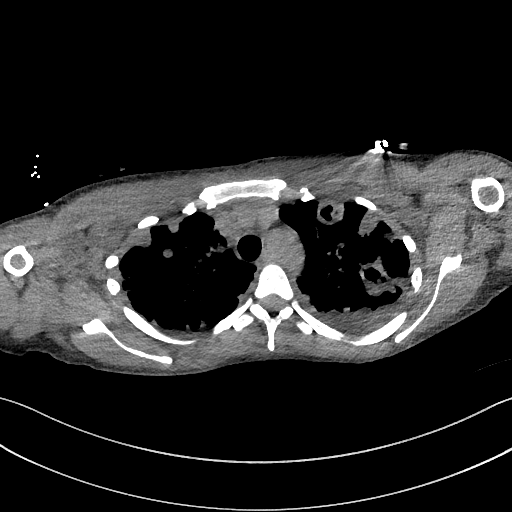
[im 117/164  lung]
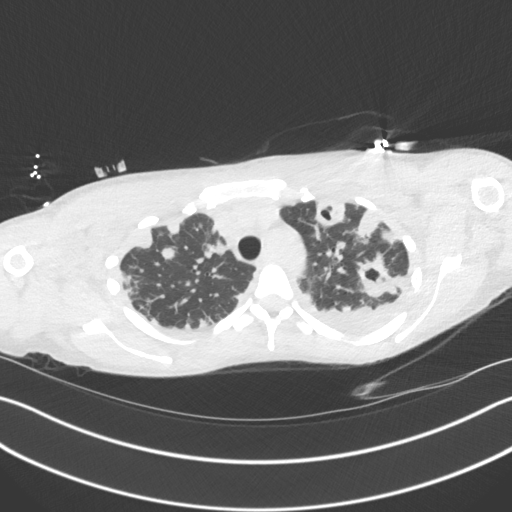
[im 129/164  lung]
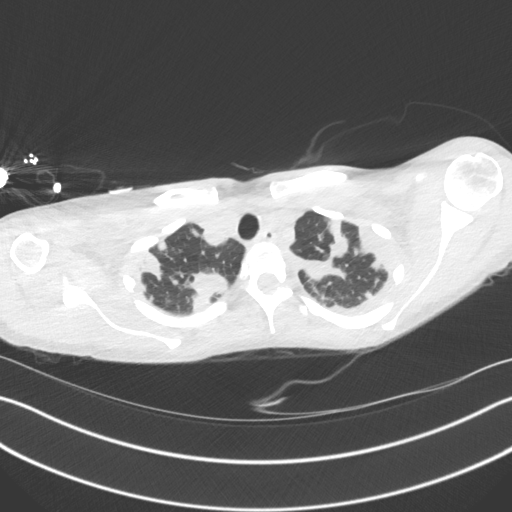
[im 140/164  lung]
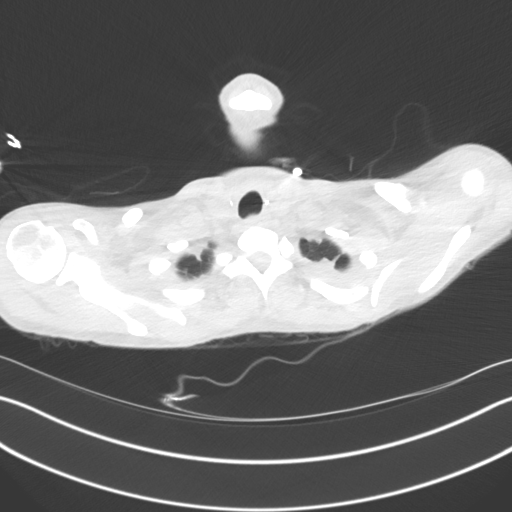
[im 152/164  lung]
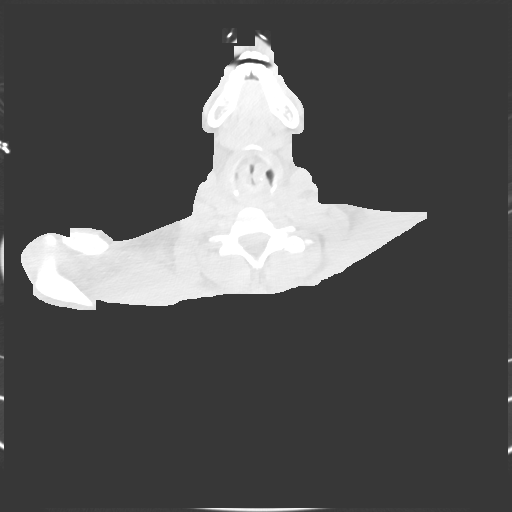

[Series 5: chest w/o 3mm st cor · coronal · non-contrast · 0.64mm/px · 3 of 76 slices shown]
[im 16/76  lung]
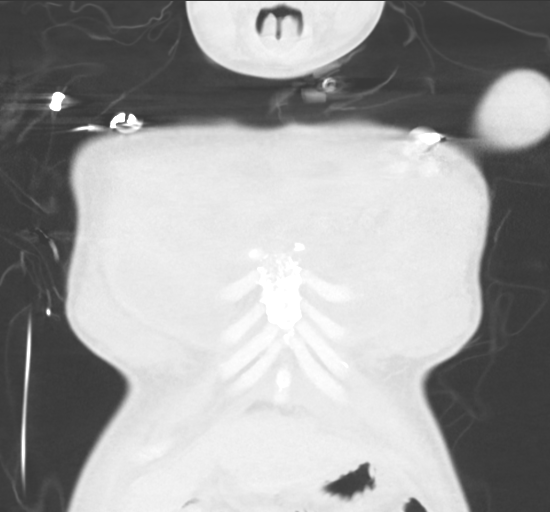
[im 31/76  lung]
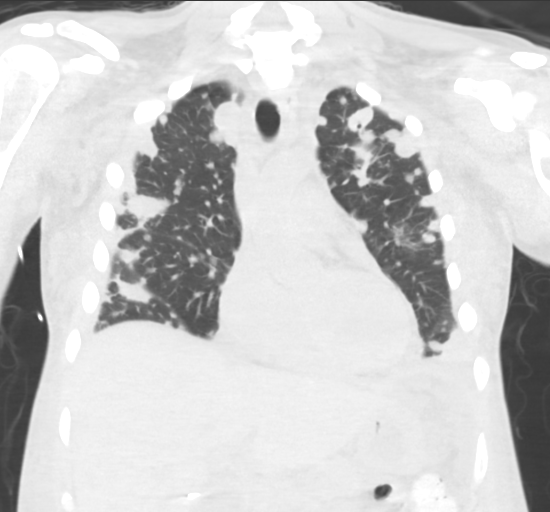
[im 46/76  lung]
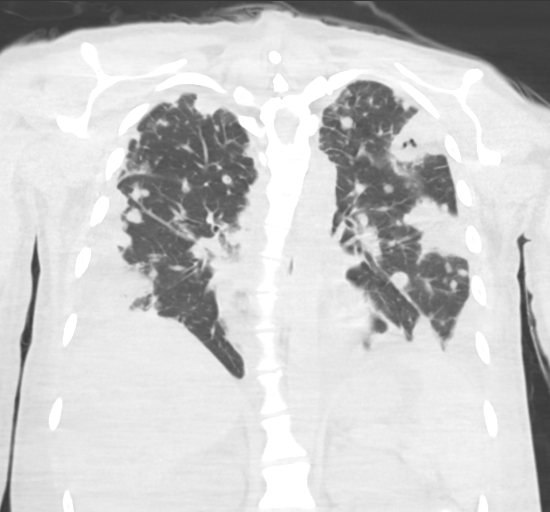

[15 of 36 positions shown; findings below may reference images not displayed]

FINDINGS: Cardiovascular: Normal heart size.  Trace pericardial effusion.

Mediastinum/Nodes: Previous thyroidectomy. Trachea patent and
midline. Normal appearance of the esophagus. There is diffuse edema
within the mid mediastinal fat diminishing contrast. Within this
limitation no adenopathy or mass identified.

Lungs/Pleura: Interval increase in bilateral posterior layering
pleural effusions. No specific findings identified to suggest
loculation although evaluation is limited due to lack of IV contrast
material. Extensive bilateral cavitary and non cavitary pulmonary
nodularity is identified compatible with septic emboli. When
compared with the previous exam there is been interval increase in
size and number of cavitating lung nodules. For example, in the left
upper lobe there is a 2.7 cm thick walled cavitary nodule, image
47/4. Previously this was non cavitary measuring 1 0.6 cm. Anterior
subpleural left upper lobe lung nodule measures 1.9 cm with
progressive cavitation. Previously this measured 1.7 cm with a small
area of internal cavitation. Lateral right upper lobe lung nodule
demonstrates progressive cavitation measuring 3 cm, image 67/4.
Previously 2.6 cm.

Upper Abdomen: Edema and ascites noted within the visualized
portions of the upper abdomen.

Musculoskeletal: There is diffuse body wall edema. Displaced,
comminuted fracture deformity of the mid body of sternum is again
noted. There is anterior displacement of the distal fracture
fragment by [DATE] shaft's width. The ventral chest wall fluid
collections appear improved from previous exam. Within the
limitations of unenhanced technique fluid collection underlying the
right pectoralis muscle measures 1.9 x 8.7 cm, image 81/3.
Previously 3.1 x 10.4 cm. The left chest wall fluid collection
measures 2.2 x 7.6 cm. Previously 3.2 by 11.6 cm. Decreased gas
noted within these fluid collections. No findings of
discitis/osteomyelitis within the visualized portions of the spine.
IMPRESSION: 1. Moderate volume bilateral pleural effusions, increased from
previous exam. Within the limitations of unenhanced technique no
definite loculation identified.
2. Extensive bilateral cavitary and non cavitary pulmonary nodules
compatible with septic emboli. Compared with previous exam the
extent of cavitation has increased in the interval.
3. Displaced, comminuted sternal fracture is again noted. Associated
bilateral ventral chest wall fluid collections are decreased in size
with resolution of internal gas.

## 2019-06-22 MED ORDER — DICYCLOMINE HCL 20 MG PO TABS
20.0000 mg | ORAL_TABLET | Freq: Two times a day (BID) | ORAL | Status: DC
  Administered 2019-06-22 – 2019-06-23 (×4): 20 mg via ORAL
  Filled 2019-06-22 (×5): qty 1

## 2019-06-22 MED ORDER — ENSURE ENLIVE PO LIQD
237.0000 mL | Freq: Three times a day (TID) | ORAL | Status: DC
  Administered 2019-06-22 – 2019-07-31 (×85): 237 mL via ORAL
  Filled 2019-06-22 (×3): qty 237

## 2019-06-22 NOTE — Progress Notes (Signed)
[facility]ica Stephens for Infectious Disease  Date of Admission:  06/14/2019     Total days of antibiotics 9         ASSESSMENT/PLAN  Monica Stephens has bilateral pulmonary septic emboli and suspected endocarditis complicated by MRSA bacteremia likely obtained from IV drug use. Course has been complicated by hypercapnia which has resolved and now on nasal cannula.   MRSA bacteremia - Repeat cultures from 6/20 are without growth to date. Suspect endocarditis as the primary source. Continue current dose of vancomycin and monitor cultures.   Hypercapnia - Resolved and now on nasal cannula. Likely related to septic emboli. Continue oxygen supplementation per primary team.  Right sided pleural effusion - CT scan with moderate pleural effusions increased from previous as well as extensive bilateral cavitary and non-cavitary pulmonary nodules consistent with septic emboli. Primary team working with IR for aspiration of pleural effusion. Continue vancomycin as septic emboli related to MRSA infection.  Therapeutic Drug Monitoring - Renal function stable with no evidence of nephrotoxicity. Continue AUC monitoring per pharmacy protocol.    Principal Problem:   MRSA bacteremia Active Problems:   Opioid overdose ([facility])   Septic pulmonary embolism, bilateral    Suspected endocarditis   Active intravenous drug use   Severe opioid dependence ([place])   Pericardial effusion   Rash/skin eruption   . busPIRone  10 mg Oral BID  . dicyclomine  20 mg Oral BID  . docusate  50 mg Oral Daily  . feeding supplement (ENSURE ENLIVE)  237 mL Oral TID BM  . heparin  5,000 Units Subcutaneous Q8H  . levothyroxine  150 mcg Oral Once per day on Sun Tue Wed Thu Fri Sat  . levothyroxine  225 mcg Oral Q Mon  . mupirocin cream   Topical BID  . polyethylene glycol  17 g Oral Daily    SUBJECTIVE:  Afebrile overnight with no acute events. Sleepy today. Occasional abdominal cramping.   No Known Allergies   Review of  Systems: Review of Systems  Constitutional: Negative for chills, fever and weight loss.  Respiratory: Negative for cough, shortness of breath and wheezing.   Cardiovascular: Negative for chest pain and leg swelling.  Gastrointestinal: Negative for abdominal pain, constipation, diarrhea, nausea and vomiting.  Skin: Negative for rash.      OBJECTIVE: Vitals:   06/22/19 1333 06/22/19 1334 06/22/19 1335 06/22/19 1517  BP:    115/84  Pulse:    84  Resp: 20 (!) 29 (!) 22 (!) 24  Temp:    98.5 F (36.9 C)  TempSrc:    Axillary  SpO2:    100%  Weight:      Height:       Body mass index is 24.03 kg/m.  Physical Exam Constitutional:      General: She is not in acute distress.    Appearance: She is well-developed. She is ill-appearing.     Interventions: Nasal cannula in place.  Cardiovascular:     Rate and Rhythm: Normal rate and regular rhythm.     Heart sounds: Normal heart sounds.  Pulmonary:     Effort: Pulmonary effort is normal.     Breath sounds: Normal breath sounds.  Skin:    General: Skin is warm and dry.  Neurological:     Mental Status: She is alert and oriented to person, place, and time.  Psychiatric:        Behavior: Behavior normal.        Thought Content: Thought  content normal.        Judgment: Judgment normal.     Lab Results Lab Results  Component Value Date   WBC 11.1 (H) 06/22/2019   HGB 8.4 (L) 06/22/2019   HCT 26.6 (L) 06/22/2019   MCV 91.7 06/22/2019   PLT 310 06/22/2019    Lab Results  Component Value Date   CREATININE 0.74 06/22/2019   BUN 34 (H) 06/22/2019   NA 139 06/22/2019   K 4.5 06/22/2019   CL 103 06/22/2019   CO2 30 06/22/2019    Lab Results  Component Value Date   ALT 20 06/21/2019   AST 33 06/21/2019   ALKPHOS 87 06/21/2019   BILITOT 0.3 06/21/2019     Microbiology: Recent Results (from the past 240 hour(s))  Culture, blood (Routine x 2)     Status: Abnormal   Collection Time: 06/14/19 10:45 AM   Specimen: BLOOD  RIGHT HAND  Result Value Ref Range Status   Specimen Description BLOOD RIGHT HAND  Final   Special Requests   Final    BOTTLES DRAWN AEROBIC ONLY Blood Culture adequate volume   Culture  Setup Time   Final    GRAM POSITIVE COCCI AEROBIC BOTTLE ONLY CRITICAL RESULT CALLED TO, READ BACK BY AND VERIFIED WITH: PHRMD L SEAY @0136  06/15/19 BY S GEZAHEGN Performed at [facility], [address], [place], [place] [address]    Culture METHICILLIN RESISTANT STAPHYLOCOCCUS AUREUS (A)  Final   Report Status 06/16/2019 FINAL  Final   Organism ID, Bacteria METHICILLIN RESISTANT STAPHYLOCOCCUS AUREUS  Final      Susceptibility   Methicillin resistant staphylococcus aureus - MIC*    CIPROFLOXACIN >=8 RESISTANT Resistant     ERYTHROMYCIN >=8 RESISTANT Resistant     GENTAMICIN <=0.5 SENSITIVE Sensitive     OXACILLIN >=4 RESISTANT Resistant     TETRACYCLINE <=1 SENSITIVE Sensitive     VANCOMYCIN 1 SENSITIVE Sensitive     TRIMETH/SULFA <=10 SENSITIVE Sensitive     CLINDAMYCIN <=0.25 SENSITIVE Sensitive     RIFAMPIN <=0.5 SENSITIVE Sensitive     Inducible Clindamycin NEGATIVE Sensitive     * METHICILLIN RESISTANT STAPHYLOCOCCUS AUREUS  Blood Culture ID Panel (Reflexed)     Status: Abnormal   Collection Time: 06/14/19 10:45 AM  Result Value Ref Range Status   Enterococcus species NOT DETECTED NOT DETECTED Final   Listeria monocytogenes NOT DETECTED NOT DETECTED Final   Staphylococcus species DETECTED (A) NOT DETECTED Final    Comment: CRITICAL RESULT CALLED TO, READ BACK BY AND VERIFIED WITH: PHRMD L SEAY @0136  06/15/19 BY S GEZAHEGN    Staphylococcus aureus (BCID) DETECTED (A) NOT DETECTED Final    Comment: Methicillin (oxacillin)-resistant Staphylococcus aureus (MRSA). MRSA is predictably resistant to beta-lactam antibiotics (except ceftaroline). Preferred therapy is vancomycin unless clinically contraindicated. Patient requires contact precautions if  hospitalized. CRITICAL RESULT CALLED TO,  READ BACK BY AND VERIFIED WITH: PHRMD L SEAY @0136  06/15/19 BY S GEZAHEGN    Methicillin resistance DETECTED (A) NOT DETECTED Final    Comment: CRITICAL RESULT CALLED TO, READ BACK BY AND VERIFIED WITH: PHRMD L SEAY @0136  06/15/19 BY S GEZAHEGN    Streptococcus species NOT DETECTED NOT DETECTED Final   Streptococcus agalactiae NOT DETECTED NOT DETECTED Final   Streptococcus pneumoniae NOT DETECTED NOT DETECTED Final   Streptococcus pyogenes NOT DETECTED NOT DETECTED Final   Acinetobacter baumannii NOT DETECTED NOT DETECTED Final   Enterobacteriaceae species NOT DETECTED NOT DETECTED Final   Enterobacter cloacae  complex NOT DETECTED NOT DETECTED Final   Escherichia coli NOT DETECTED NOT DETECTED Final   Klebsiella oxytoca NOT DETECTED NOT DETECTED Final   Klebsiella pneumoniae NOT DETECTED NOT DETECTED Final   Proteus species NOT DETECTED NOT DETECTED Final   Serratia marcescens NOT DETECTED NOT DETECTED Final   Haemophilus influenzae NOT DETECTED NOT DETECTED Final   Neisseria meningitidis NOT DETECTED NOT DETECTED Final   Pseudomonas aeruginosa NOT DETECTED NOT DETECTED Final   Candida albicans NOT DETECTED NOT DETECTED Final   Candida glabrata NOT DETECTED NOT DETECTED Final   Candida krusei NOT DETECTED NOT DETECTED Final   Candida parapsilosis NOT DETECTED NOT DETECTED Final   Candida tropicalis NOT DETECTED NOT DETECTED Final    Comment: Performed at [facility], [place] [address] [place], [place] [address]  Culture, blood (Routine x 2)     Status: Abnormal   Collection Time: 06/14/19 10:55 AM   Specimen: BLOOD  Result Value Ref Range Status   Specimen Description BLOOD SITE NOT SPECIFIED  Final   Special Requests   Final    BOTTLES DRAWN AEROBIC AND ANAEROBIC Blood Culture adequate volume   Culture  Setup Time   Final    GRAM POSITIVE COCCI IN BOTH AEROBIC AND ANAEROBIC BOTTLES CRITICAL VALUE NOTED.  VALUE IS CONSISTENT WITH PREVIOUSLY REPORTED AND CALLED VALUE.     Culture (A)  Final    STAPHYLOCOCCUS AUREUS SUSCEPTIBILITIES PERFORMED ON PREVIOUS CULTURE WITHIN THE LAST 5 DAYS. Performed at [facility], [place] [address] [place], [place] [address]    Report Status 06/16/2019 FINAL  Final  Culture, Urine     Status: Abnormal   Collection Time: 06/14/19 12:15 PM   Specimen: Urine, Random  Result Value Ref Range Status   Specimen Description URINE, RANDOM  Final   Special Requests   Final    NONE Performed at [facility], [place] [address], [place], [place] [address]    Culture (A)  Final    80,000 COLONIES/mL METHICILLIN RESISTANT STAPHYLOCOCCUS AUREUS   Report Status 06/17/2019 FINAL  Final   Organism ID, Bacteria METHICILLIN RESISTANT STAPHYLOCOCCUS AUREUS (A)  Final      Susceptibility   Methicillin resistant staphylococcus aureus - MIC*    CIPROFLOXACIN >=8 RESISTANT Resistant     GENTAMICIN <=0.5 SENSITIVE Sensitive     NITROFURANTOIN <=16 SENSITIVE Sensitive     OXACILLIN >=4 RESISTANT Resistant     TETRACYCLINE <=1 SENSITIVE Sensitive     VANCOMYCIN 1 SENSITIVE Sensitive     TRIMETH/SULFA <=10 SENSITIVE Sensitive     CLINDAMYCIN <=0.25 SENSITIVE Sensitive     RIFAMPIN <=0.5 SENSITIVE Sensitive     Inducible Clindamycin NEGATIVE Sensitive     * 80,000 COLONIES/mL METHICILLIN RESISTANT STAPHYLOCOCCUS AUREUS  SARS Coronavirus 2     Status: None   Collection Time: 06/14/19 12:34 PM  Result Value Ref Range Status   SARS Coronavirus 2 NOT DETECTED NOT DETECTED Final    Comment: (NOTE) SARS-CoV-2 target nucleic acids are NOT DETECTED. The SARS-CoV-2 RNA is generally detectable in upper and lower respiratory specimens during the acute phase of infection.  Negative  results do not preclude SARS-CoV-2 infection, do not rule out co-infections with other pathogens, and should not be used as the sole basis for treatment or other patient management decisions.  Negative results must be combined with clinical observations, patient  history, and epidemiological information. The expected result is Not Detected. Fact Sheet for Patients: http://www.biofiredefense.com/wp-content/uploads/2020/03/BIOFIRE-COVID -19-patients.pdf Fact  Sheet for Healthcare Providers: http://www.biofiredefense.com/wp-content/uploads/2020/03/BIOFIRE-COVID -19-hcp.pdf This test is not yet approved or cleared by the [place] and  has been authorized for detection and/or diagnosis of SARS-CoV-2 by FDA under an Emergency Use Authorization (EUA).  This EUA will remain in effec t (meaning this test can be used) for the duration of  the COVID-19 declaration under Section 564(b)(1) of the Act, 21 U.S.C. section 360bbb-3(b)(1), unless the authorization is terminated or revoked sooner. Performed at [facility], [place] [address] [place], [place] [address]   Novel Coronavirus, NAA (hospital order; send-out to ref lab)     Status: None   Collection Time: 06/14/19  6:37 PM   Specimen: Nasopharyngeal Swab; Respiratory  Result Value Ref Range Status   SARS-CoV-2, NAA NOT DETECTED NOT DETECTED Final    Comment: (NOTE) Testing was performed using the cobas(R) SARS-CoV-2 test. This test was developed and its performance characteristics determined by [employer]. This test has not been FDA cleared or approved. This test has been authorized by FDA under an Emergency Use Authorization (EUA). This test is only authorized for the duration of time the declaration that circumstances exist justifying the authorization of the emergency use of in vitro diagnostic tests for detection of SARS-CoV-2 virus and/or diagnosis of COVID-19 infection under section 564(b)(1) of the Act, 21 U.S.C. 878MVE-7(M)(0), unless the authorization is terminated or revoked sooner. When diagnostic testing is negative, the possibility of a false negative result should be considered in the context of a patient's recent exposures and the presence of clinical signs and  symptoms consistent with COVID-19. An individual without symptoms of COVID-19 and who is not shedding SARS-CoV-2 virus would expect to have  a negative (not detected) result in this assay. Performed At: [facility] [address] [place], [place] [id number] Rush Farmer MD MO:[id number]    [place]  Final    Comment: Performed at [facility], [place] [address] [place], [place] [address]  Culture, blood (routine x 2)     Status: None   Collection Time: 06/15/19  4:00 PM   Specimen: BLOOD  Result Value Ref Range Status   Specimen Description BLOOD RIGHT ANTECUBITAL  Final   Special Requests   Final    BOTTLES DRAWN AEROBIC ONLY Blood Culture adequate volume   Culture   Final    NO GROWTH 5 DAYS Performed at [facility], [place] [address], [place], [place] [address]    Report Status 06/20/2019 FINAL  Final  Culture, blood (routine x 2)     Status: None   Collection Time: 06/15/19  4:04 PM   Specimen: BLOOD LEFT HAND  Result Value Ref Range Status   Specimen Description BLOOD LEFT HAND  Final   Special Requests   Final    BOTTLES DRAWN AEROBIC ONLY Blood Culture adequate volume   Culture   Final    NO GROWTH 5 DAYS Performed at [facility], [place] [address], [place], [place] [address]    Report Status 06/20/2019 FINAL  Final  Aerobic Culture (superficial specimen)     Status: None   Collection Time: 06/16/19  2:20 PM   Specimen: Wound  Result Value Ref Range Status   Specimen Description WOUND CHEST  Final   Special Requests Normal  Final   Gram Stain   Final    RARE WBC PRESENT, PREDOMINANTLY MONONUCLEAR RARE GRAM POSITIVE COCCI Performed at [facility], [address], [place], [place]  [address]    Culture FEW METHICILLIN RESISTANT STAPHYLOCOCCUS AUREUS  Final   Report Status 06/18/2019 FINAL  Final   Organism ID, Bacteria METHICILLIN RESISTANT STAPHYLOCOCCUS AUREUS  Final      Susceptibility   Methicillin  resistant staphylococcus aureus - MIC*    CIPROFLOXACIN >=8 RESISTANT Resistant     ERYTHROMYCIN >=8 RESISTANT Resistant     GENTAMICIN <=0.5 SENSITIVE Sensitive     OXACILLIN >=4 RESISTANT Resistant     TETRACYCLINE <=1 SENSITIVE Sensitive     VANCOMYCIN 1 SENSITIVE Sensitive     TRIMETH/SULFA <=10 SENSITIVE Sensitive     CLINDAMYCIN <=0.25 SENSITIVE Sensitive     RIFAMPIN <=0.5 SENSITIVE Sensitive     Inducible Clindamycin NEGATIVE Sensitive     * FEW METHICILLIN RESISTANT STAPHYLOCOCCUS AUREUS  Culture, blood (routine x 2)     Status: Abnormal   Collection Time: 06/17/19 10:25 AM   Specimen: BLOOD LEFT HAND  Result Value Ref Range Status   Specimen Description BLOOD LEFT HAND  Final   Special Requests   Final    BOTTLES DRAWN AEROBIC ONLY Blood Culture adequate volume   Culture  Setup Time   Final    GRAM POSITIVE COCCI IN CLUSTERS AEROBIC BOTTLE ONLY CRITICAL VALUE NOTED.  VALUE IS CONSISTENT WITH PREVIOUSLY REPORTED AND CALLED VALUE. Performed at [facility], [place] [address], [place], [place] [address]    Culture METHICILLIN RESISTANT STAPHYLOCOCCUS AUREUS (A)  Final   Report Status 06/20/2019 FINAL  Final   Organism ID, Bacteria METHICILLIN RESISTANT STAPHYLOCOCCUS AUREUS  Final      Susceptibility   Methicillin resistant staphylococcus aureus - MIC*    CIPROFLOXACIN >=8 RESISTANT Resistant     ERYTHROMYCIN >=8 RESISTANT Resistant     GENTAMICIN <=0.5 SENSITIVE Sensitive     OXACILLIN >=4 RESISTANT Resistant     TETRACYCLINE <=1 SENSITIVE Sensitive     VANCOMYCIN 1 SENSITIVE Sensitive     TRIMETH/SULFA <=10 SENSITIVE Sensitive     CLINDAMYCIN <=0.25 SENSITIVE Sensitive     RIFAMPIN <=0.5 SENSITIVE Sensitive     Inducible Clindamycin NEGATIVE Sensitive     * METHICILLIN RESISTANT STAPHYLOCOCCUS AUREUS  Culture, blood (routine x 2)     Status: Abnormal   Collection Time: 06/17/19 10:30 AM   Specimen: BLOOD RIGHT HAND  Result Value Ref Range Status   Specimen  Description BLOOD RIGHT HAND  Final   Special Requests   Final    BOTTLES DRAWN AEROBIC ONLY Blood Culture adequate volume   Culture  Setup Time   Final    GRAM POSITIVE COCCI AEROBIC BOTTLE ONLY CRITICAL VALUE NOTED.  VALUE IS CONSISTENT WITH PREVIOUSLY REPORTED AND CALLED VALUE. Performed at [facility], [place] [address] [place], [place] [address]    Culture STAPHYLOCOCCUS AUREUS (A)  Final   Report Status 06/20/2019 FINAL  Final  Culture, blood (Routine X 2) w Reflex to ID Panel     Status: None (Preliminary result)   Collection Time: 06/20/19 12:41 AM   Specimen: BLOOD RIGHT HAND  Result Value Ref Range Status   Specimen Description BLOOD RIGHT HAND  Final   Special Requests   Final    BOTTLES DRAWN AEROBIC ONLY Blood Culture adequate volume   Culture   Final    NO GROWTH 2 DAYS Performed at [facility], [place] [address] [place], [place] [address]    Report Status PENDING  Incomplete  Culture, blood (Routine X 2) w Reflex to ID  Panel     Status: None (Preliminary result)   Collection Time: 06/20/19 12:51 AM   Specimen: BLOOD LEFT HAND  Result Value Ref Range Status   Specimen Description BLOOD LEFT HAND  Final   Special Requests   Final    BOTTLES DRAWN AEROBIC AND ANAEROBIC Blood Culture adequate volume   Culture   Final    NO GROWTH 2 DAYS Performed at [facility], [address], [place], [place] [address]    Report Status PENDING  Incomplete  MRSA PCR Screening     Status: None   Collection Time: 06/21/19  9:22 AM   Specimen: Nasal Mucosa; Nasopharyngeal  Result Value Ref Range Status   MRSA by PCR NEGATIVE NEGATIVE Final    Comment:        The GeneXpert MRSA Assay (FDA approved for NASAL specimens only), is one component of a comprehensive MRSA colonization surveillance program. It is not intended to diagnose MRSA infection nor to guide or monitor treatment for MRSA infections. Performed at [facility], [place] [address],  [place], [place] [address]      Terri Piedra, [place] for [facility] [phone] Pager  06/22/2019  5:23 PM

## 2019-06-22 NOTE — Progress Notes (Signed)
   Subjective: Monica Stephens was seen laying in her bed this morning appearing well and able to sit up. Patient mentioned that she had some abdominal cramping this morning. She said that she does not like the taste of miralax. She had a bowel movement yesterday.   Objective:  Vital signs in last 24 hours: Vitals:   06/21/19 2040 06/21/19 2230 06/21/19 2352 06/22/19 0325  BP: 99/77  103/69 111/82  Pulse: (!) 115 87 98 100  Resp: (!) 23 15 17  (!) 21  Temp: 97.9 F (36.6 C)  98.3 F (36.8 C) 97.6 F (36.4 C)  TempSrc: Oral  Axillary Oral  SpO2: 98% 99% 99% 98%  Weight: 60.1 kg   59.6 kg  Height:       Physical Exam:  VS reviewed, nursing notes reviewed. General: No acute distress CV: RRR, Nl S1S2, 1 + pitting/nonpitting upper and lower extremity edema Pulm: Rhonchi at right lower lobe, with decreased breath sound at baseline of right lower lung (improved) Abdomen: Soft, mild tenderness to palpation at periumbilical area Neuro exam: A & O x3  Assessment/Plan:  Principal Problem:   MRSA bacteremia Active Problems:   Opioid overdose ([facility])   Septic pulmonary embolism, bilateral    Suspected endocarditis   Active intravenous drug use   Severe opioid dependence ([facility])   Pericardial effusion   Rash/skin eruption  Monica Stephens is a 38 year old female withpolysubstance abuse including IVheroin, bipolar, history of thyroid cancer who presents with chest pain and dyspneaand found to have bilateral septic pulmonary emboliwith severe pulmonary infection and MRSA bacteremia. Complicated with acute hypercapnic respiratory failure, and transferred to ICU and got Bi PAP came back to medical floor yesterday.  Acute hypercarbic respiratory failure  Resolved  -transferred back to medical floor yesterday  -Nasal O2 -C-pap at night   MRSA bacteremiaw/ septic emboli to lungs, paraspinal muscles &ileum.  Probably hx of rightsided endocarditiswith no acute vegetations on TEE.  BC  6/14MRSA positive BC 6/15 no growth5 days BC 6/17MRSA growing in 4/4 bottles  BC 6/20 negative x 1  -continue vancomycin (day 8)   Right-sided pleural effusion concerning for early empyema -Repeat CT today 6/23 to determine if patient can get a percutaneous drainage done by IR.   Intermittent SVT with bradycardia: No further episode  TEE without acute vegetations. Likely driven by underlying multifocal pneumonia. Dr. Debara Pickett has evaluated patient and recommended prn iv metoprolol if sustained and hemodynamically significant svt.  Acute normocytic anemia Hb stable 7.0 -CBC daily  Opioid Use Disorder: Constipation and abdominal distention: Mild ileus and moderate stool burden on abdominal Xray: Improved Received IV Dilaudid 0.2 mg IV  Tried to decrease Opioid as much as possible in setting of acute respiratory syndrome. No withdrawal so far. Still gets low dose of IV Dilaudid.   -Continue with IV Dilaudid q6 h PRN -stopped methadone 6/20 -Increased Buspirone 10 mg BIDto 15mg  bid -CtHydroxyzine 25 mg TID PRNfor anxiety -continue pt and ot  -Added bentyl 20mg  bid    Hyperkalemia: Resolved -BMP daily  Vesicular rash improved Culture with WBC and rare gr positive cocci. Likley due to MRSA bacteremia. -Varicella PCR is pending -Continue to monitor, pain medications as above  Dispo: Anticipated discharge once stable    Dispo: Anticipated discharge in approximately 5 weeks (after finishing IV AB course). SNF   Monica Hatch, MD 06/22/2019, 6:17 AM Pager: @[email or web address] @

## 2019-06-22 NOTE — Progress Notes (Signed)
Pt refusing to use CPAP tonight.  States her stomach is cramping and cant tolerate using it.  Said she will have nurse call if she changes her mind.  RT will continue to monitor.
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
## 2019-06-22 NOTE — Progress Notes (Signed)
Updates on plan of care given to patient's mother via phone.

## 2019-06-22 NOTE — Progress Notes (Signed)
Pharmacy Antibiotic Note  Monica Stephens is a 38 y.o. female admitted on 06/14/2019 with MRSA bacteremiaPharmacy has been consulted for vancomycin dosing.    Scr stable  Plan: Continue Vancomycin 1 grams iv Q 24 Repeat levels at end of the week  Height: 5\' 2"  (157.5 cm) Weight: 131 lb 6.3 oz (59.6 kg) IBW/kg (Calculated) : 50.1  Temp (24hrs), Avg:97.9 F (36.6 C), Min:97.6 F (36.4 C), Max:98.3 F (36.8 C)  Recent Labs  Lab 06/19/19 0503 06/19/19 1328  06/19/19 2227 06/20/19 0055 06/20/19 1120 06/20/19 2127 06/21/19 0401 06/22/19 0434 06/22/19 0634  WBC 7.1  --   --   --  11.2* 8.6  --  9.3  --  11.1*  CREATININE 0.90  --    < >  --  0.81 0.86 0.74 0.76 0.74  --   LATICACIDVEN  --   --   --   --   --   --   --  1.1  --   --   VANCOTROUGH  --   --   --  13*  --   --   --   --   --   --   VANCOPEAK  --  26*  --   --   --   --   --   --   --   --    < > = values in this interval not displayed.    Estimated Creatinine Clearance: 76.9 mL/min (by C-G formula based on SCr of 0.74 mg/dL).    No Known Allergies  Thank you Anette Guarneri, PharmD  06/22/2019 10:11 AM

## 2019-06-23 LAB — BASIC METABOLIC PANEL
Anion gap: 8 (ref 5–15)
BUN: 30 mg/dL — ABNORMAL HIGH (ref 6–20)
CO2: 27 mmol/L (ref 22–32)
Calcium: 7.3 mg/dL — ABNORMAL LOW (ref 8.9–10.3)
Chloride: 102 mmol/L (ref 98–111)
Creatinine, Ser: 0.63 mg/dL (ref 0.44–1.00)
GFR calc Af Amer: >60 mL/min (ref >60)
GFR calc non Af Amer: >60 mL/min (ref >60)
Glucose, Bld: 105 mg/dL — ABNORMAL HIGH (ref 70–99)
Potassium: 4.9 mmol/L (ref 3.5–5.1)
Sodium: 137 mmol/L (ref 135–145)

## 2019-06-23 LAB — CBC
HCT: 32.3 % — ABNORMAL LOW (ref 36.0–46.0)
Hemoglobin: 10.2 g/dL — ABNORMAL LOW (ref 12.0–15.0)
MCH: 28.8 pg (ref 26.0–34.0)
MCHC: 31.6 g/dL (ref 30.0–36.0)
MCV: 91.2 fL (ref 80.0–100.0)
Platelets: 357 10*3/uL (ref 150–400)
RBC: 3.54 MIL/uL — ABNORMAL LOW (ref 3.87–5.11)
RDW: 16.9 % — ABNORMAL HIGH (ref 11.5–15.5)
WBC: 13.7 10*3/uL — ABNORMAL HIGH (ref 4.0–10.5)
nRBC: 0.4 % — ABNORMAL HIGH (ref 0.0–0.2)

## 2019-06-23 LAB — VARICELLA-ZOSTER BY PCR: Varicella-Zoster, PCR: NEGATIVE

## 2019-06-23 MED ORDER — ADULT MULTIVITAMIN W/MINERALS CH
1.0000 | ORAL_TABLET | Freq: Every day | ORAL | Status: DC
  Administered 2019-06-23 – 2019-07-31 (×39): 1 via ORAL
  Filled 2019-06-23 (×39): qty 1

## 2019-06-23 MED ORDER — CALCIUM CARBONATE ANTACID 500 MG PO CHEW
400.0000 mg | CHEWABLE_TABLET | Freq: Once | ORAL | Status: AC
  Administered 2019-06-26: 06:00:00 400 mg via ORAL
  Filled 2019-06-23 (×2): qty 2

## 2019-06-23 NOTE — Progress Notes (Signed)
Nutrition Follow-up  DOCUMENTATION CODES:   Not applicable  INTERVENTION:    Ensure Enlive po BID, each supplement provides 350 kcal and 20 grams of protein  MVI daily  NUTRITION DIAGNOSIS:   Increased nutrient needs related to acute illness as evidenced by estimated needs.  GOAL:   Patient will meet greater than or equal to 90% of their needs  MONITOR:   PO intake, Supplement acceptance, Skin, I & O's, Labs, Weight trends  REASON FOR ASSESSMENT:   Consult Assessment of nutrition requirement/status  ASSESSMENT:   Patient with PMH significant for bipolar disorder, thyroid cancer s/p thyroidectomy, and polysubstance abuse. Presents this admission with right PE with concern for developing empyema.   Spoke with pt at bedside. Denies having loss in appetite PTA. Typically eats three meals daily that consist of "normal foods." Unwilling to elaborate on meal composition. Meal completions charted as 50-75% for her last two meals. RD observed pt drinking Ensure. She would like to continue these.   Pt endorses a UBW of 105 lb and denies unintentional wt loss PTA. Records indicate pt weighed 92 lb on 05/26/18 and 117 lb this admission. Edema present in all extremities. Suspect increase in weight is related to fluid accumulation.   Plan for pt to be admitted x5 weeks for antibiotic therapy.   I/O: +3,049 ml since admit UOP: 1,000 ml x 24 hrs   Medications: colace, miralax Labs: CBG 88-114  Diet Order:   Diet Order            Diet regular Room service appropriate? Yes with Assist; Fluid consistency: Thin  Diet effective now              EDUCATION NEEDS:   Education needs have been addressed  Skin:  Skin Assessment: Skin Integrity Issues: Skin Integrity Issues:: Other (Comment) Other: blister-mid chest  Last BM:  6/23  Height:   Ht Readings from Last 1 Encounters:  06/14/19 5\' 2"  (1.575 m)    Weight:   Wt Readings from Last 1 Encounters:  06/23/19 60.8 kg     Ideal Body Weight:  50 kg  BMI:  Body mass index is 24.52 kg/m.  Estimated Nutritional Needs:   Kcal:  1600-1800 kcal  Protein:  80-95 grams  Fluid:  >/= 1.6 L/day   Mariana Single RD, LDN Clinical Nutrition Pager # - [phone]

## 2019-06-23 NOTE — Progress Notes (Signed)
[address]       [place] [address]             [phone]    Asked to see patient re: pleural effusions on CT.  38 yo woman with a history of IVDA, bipolar disorder, PTSD, thyroid cancer and tobacco abuse. She was seen by Dr. Cyndia Bent last week for septic pulmonary emboli. TEE showed no evidence of vegetations.  She had a repeat CT yesterday which showed some progression of her pulmonary lesions and increased bilateral pleural effusions "without definite loculation identified."   She says she thinks her breathing is a little better today.  On exam  BP 113/72 (BP Location: Left Arm)   Pulse 95   Temp 98.3 F (36.8 C) (Oral)   Resp (!) 24   Ht 5\' 2"  (1.575 m)   Wt 60.8 kg   LMP 06/13/2018   SpO2 98%   BMI 24.52 kg/m   Ill-appearing 38 yo woman in obvious discomfort with any movement. Unable to sit up without assistance. Diminished BS at both bases   CT CHEST WITHOUT CONTRAST  TECHNIQUE: Multidetector CT imaging of the chest was performed following the standard protocol without IV contrast.  COMPARISON:  06/14/2019  FINDINGS: Cardiovascular: Normal heart size.  Trace pericardial effusion.  Mediastinum/Nodes: Previous thyroidectomy. Trachea patent and midline. Normal appearance of the esophagus. There is diffuse edema within the mid mediastinal fat diminishing contrast. Within this limitation no adenopathy or mass identified.  Lungs/Pleura: Interval increase in bilateral posterior layering pleural effusions. No specific findings identified to suggest loculation although evaluation is limited due to lack of IV contrast material. Extensive bilateral cavitary and non cavitary pulmonary nodularity is identified compatible with septic emboli. When compared with the previous exam there is been interval increase in size and number of cavitating lung nodules. For example, in the left upper lobe there is a 2.7 cm thick walled cavitary nodule,  image 47/4. Previously this was non cavitary measuring 1 0.6 cm. Anterior subpleural left upper lobe lung nodule measures 1.9 cm with progressive cavitation. Previously this measured 1.7 cm with a small area of internal cavitation. Lateral right upper lobe lung nodule demonstrates progressive cavitation measuring 3 cm, image 67/4. Previously 2.6 cm.  Upper Abdomen: Edema and ascites noted within the visualized portions of the upper abdomen.  Musculoskeletal: There is diffuse body wall edema. Displaced, comminuted fracture deformity of the mid body of sternum is again noted. There is anterior displacement of the distal fracture fragment by 1/2 shaft's width. The ventral chest wall fluid collections appear improved from previous exam. Within the limitations of unenhanced technique fluid collection underlying the right pectoralis muscle measures 1.9 x 8.7 cm, image 81/3. Previously 3.1 x 10.4 cm. The left chest wall fluid collection measures 2.2 x 7.6 cm. Previously 3.2 by 11.6 cm. Decreased gas noted within these fluid collections. No findings of discitis/osteomyelitis within the visualized portions of the spine.  IMPRESSION: 1. Moderate volume bilateral pleural effusions, increased from previous exam. Within the limitations of unenhanced technique no definite loculation identified. 2. Extensive bilateral cavitary and non cavitary pulmonary nodules compatible with septic emboli. Compared with previous exam the extent of cavitation has increased in the interval. 3. Displaced, comminuted sternal fracture is again noted. Associated bilateral ventral chest wall fluid collections are decreased in size with resolution of internal gas.   Electronically Signed   By: Kerby Moors M.D.   On: 06/22/2019 14:39 I personally reviewed the  CT images and concur with the findings noted above.   A/P  38 yo woman with history of bipolar disorder, PTSD, IVDA who has multiple septic  pulmonary emboli. CT shows increased size of pleural effusions bilaterally. No definite loculation, but scan done without contrast and it is extremely difficult to tell what is effusion and what are parenchymal changes. In the main body of the effusions the density is c/w serous fluid.  Options are  1. Continue observation and repeat a CT if CXR shows increasing opacity  2. Repeat the CT using IV contrast to get a better picture of what is lung and what is fluid.  3. Have Pulmonary or IR do an image guided thoracentesis to get fluid for evaluation. Would probably start with the right side.  4. Have IR place a pigtail catheter for drainage of one or both of the effusions.   I would recommend starting with 3 and have IR or pulmonary do a thoracentesis.  There is no indication for surgery  Remo Lipps C. Roxan Hockey, MD [facility] [phone]

## 2019-06-23 NOTE — Progress Notes (Signed)
Patient refusing CPAP. States she will try and wear tomorrow night but not tonight. Informed her if she changes her mind to have the RN call. Vitals stable.

## 2019-06-23 NOTE — Progress Notes (Signed)
[facility]  Date of Admission:  06/14/2019     Total days of antibiotics 10         ASSESSMENT/PLAN  Ms. Griffee has bilateral pulmonary septic emboli and suspected endocarditis complicated by MRSA bacteremia. Course complicated by hypercapnia which has since resolved and continues on nasal cannula.  MRSA bacteremia - Repeat cultures without growth to date. Has disseminated infection and will require at least 6 weeks of therapy. Continue current dose of vancomycin and monitor cultures.  Hypercapnia - Lethargic but arousable on exam. Remains on 2 L via nasal cannula. SpO2 stable. Likely the result of numerous septic pulmonary emboli.   Therapeutic Drug Monitoring - Renal function stable with no evidence of nephrotoxicity. Continue AUC monitoring per pharmacy protocol.    Principal Problem:   MRSA bacteremia Active Problems:   Opioid overdose ([place])   Septic pulmonary embolism, bilateral    Suspected endocarditis   Active intravenous drug use   Severe opioid dependence ([place])   Pericardial effusion   Rash/skin eruption   . busPIRone  10 mg Oral BID  . calcium carbonate  400 mg of elemental calcium Oral Once  . dicyclomine  20 mg Oral BID  . docusate  50 mg Oral Daily  . feeding supplement (ENSURE ENLIVE)  237 mL Oral TID BM  . heparin  5,000 Units Subcutaneous Q8H  . levothyroxine  150 mcg Oral Once per day on Sun Tue Wed Thu Fri Sat  . levothyroxine  225 mcg Oral Q Mon  . multivitamin with minerals  1 tablet Oral Daily  . mupirocin cream   Topical BID  . polyethylene glycol  17 g Oral Daily    SUBJECTIVE:  Afebrile overnight. WBC increased to 13.7. Continues on oxygen at 2 L. Feeling better today. Remains tired. Stomach cramping improved with bowel movement.   No Known Allergies   Review of Systems: Review of Systems  Constitutional: Positive for malaise/fatigue. Negative for chills, fever and weight loss.  Respiratory: Negative for  cough, shortness of breath and wheezing.   Cardiovascular: Negative for chest pain and leg swelling.  Gastrointestinal: Negative for abdominal pain, constipation, diarrhea, nausea and vomiting.  Skin: Negative for rash.      OBJECTIVE: Vitals:   06/23/19 1225 06/23/19 1235 06/23/19 1240 06/23/19 1245  BP:    106/81  Pulse:    92  Resp: (!) 26 (!) 32 (!) 31 (!) 21  Temp:    97.6 F (36.4 C)  TempSrc:    Oral  SpO2:    98%  Weight:      Height:       Body mass index is 24.52 kg/m.  Physical Exam Constitutional:      General: She is not in acute distress.    Appearance: She is well-developed.     Comments: Lying in bed with head of bed elevated; lethargic   Cardiovascular:     Rate and Rhythm: Normal rate and regular rhythm.     Heart sounds: Normal heart sounds.  Pulmonary:     Effort: Pulmonary effort is normal.     Breath sounds: Normal breath sounds.  Skin:    General: Skin is warm and dry.  Neurological:     Mental Status: She is alert and oriented to person, place, and time.  Psychiatric:        Behavior: Behavior normal.        Thought Content: Thought content normal.  Judgment: Judgment normal.     Lab Results Lab Results  Component Value Date   WBC 13.7 (H) 06/23/2019   HGB 10.2 (L) 06/23/2019   HCT 32.3 (L) 06/23/2019   MCV 91.2 06/23/2019   PLT 357 06/23/2019    Lab Results  Component Value Date   CREATININE 0.63 06/23/2019   BUN 30 (H) 06/23/2019   NA 137 06/23/2019   K 4.9 06/23/2019   CL 102 06/23/2019   CO2 27 06/23/2019    Lab Results  Component Value Date   ALT 20 06/21/2019   AST 33 06/21/2019   ALKPHOS 87 06/21/2019   BILITOT 0.3 06/21/2019     Microbiology: Recent Results (from the past 240 hour(s))  Culture, blood (Routine x 2)     Status: Abnormal   Collection Time: 06/14/19 10:45 AM   Specimen: BLOOD RIGHT HAND  Result Value Ref Range Status   Specimen Description BLOOD RIGHT HAND  Final   Special Requests    Final    BOTTLES DRAWN AEROBIC ONLY Blood Culture adequate volume   Culture  Setup Time   Final    GRAM POSITIVE COCCI AEROBIC BOTTLE ONLY CRITICAL RESULT CALLED TO, READ BACK BY AND VERIFIED WITH: PHRMD L SEAY @0136  06/15/19 BY S GEZAHEGN Performed at [facility], [address], [place], [place] [address]    Culture METHICILLIN RESISTANT STAPHYLOCOCCUS AUREUS (A)  Final   Report Status 06/16/2019 FINAL  Final   Organism ID, Bacteria METHICILLIN RESISTANT STAPHYLOCOCCUS AUREUS  Final      Susceptibility   Methicillin resistant staphylococcus aureus - MIC*    CIPROFLOXACIN >=8 RESISTANT Resistant     ERYTHROMYCIN >=8 RESISTANT Resistant     GENTAMICIN <=0.5 SENSITIVE Sensitive     OXACILLIN >=4 RESISTANT Resistant     TETRACYCLINE <=1 SENSITIVE Sensitive     VANCOMYCIN 1 SENSITIVE Sensitive     TRIMETH/SULFA <=10 SENSITIVE Sensitive     CLINDAMYCIN <=0.25 SENSITIVE Sensitive     RIFAMPIN <=0.5 SENSITIVE Sensitive     Inducible Clindamycin NEGATIVE Sensitive     * METHICILLIN RESISTANT STAPHYLOCOCCUS AUREUS  Blood Culture ID Panel (Reflexed)     Status: Abnormal   Collection Time: 06/14/19 10:45 AM  Result Value Ref Range Status   Enterococcus species NOT DETECTED NOT DETECTED Final   Listeria monocytogenes NOT DETECTED NOT DETECTED Final   Staphylococcus species DETECTED (A) NOT DETECTED Final    Comment: CRITICAL RESULT CALLED TO, READ BACK BY AND VERIFIED WITH: PHRMD L SEAY @0136  06/15/19 BY S GEZAHEGN    Staphylococcus aureus (BCID) DETECTED (A) NOT DETECTED Final    Comment: Methicillin (oxacillin)-resistant Staphylococcus aureus (MRSA). MRSA is predictably resistant to beta-lactam antibiotics (except ceftaroline). Preferred therapy is vancomycin unless clinically contraindicated. Patient requires contact precautions if  hospitalized. CRITICAL RESULT CALLED TO, READ BACK BY AND VERIFIED WITH: PHRMD L SEAY @0136  06/15/19 BY S GEZAHEGN    Methicillin resistance DETECTED  (A) NOT DETECTED Final    Comment: CRITICAL RESULT CALLED TO, READ BACK BY AND VERIFIED WITH: PHRMD L SEAY @0136  06/15/19 BY S GEZAHEGN    Streptococcus species NOT DETECTED NOT DETECTED Final   Streptococcus agalactiae NOT DETECTED NOT DETECTED Final   Streptococcus pneumoniae NOT DETECTED NOT DETECTED Final   Streptococcus pyogenes NOT DETECTED NOT DETECTED Final   Acinetobacter baumannii NOT DETECTED NOT DETECTED Final   Enterobacteriaceae species NOT DETECTED NOT DETECTED Final   Enterobacter cloacae complex NOT DETECTED NOT DETECTED Final   Escherichia  coli NOT DETECTED NOT DETECTED Final   Klebsiella oxytoca NOT DETECTED NOT DETECTED Final   Klebsiella pneumoniae NOT DETECTED NOT DETECTED Final   Proteus species NOT DETECTED NOT DETECTED Final   Serratia marcescens NOT DETECTED NOT DETECTED Final   Haemophilus influenzae NOT DETECTED NOT DETECTED Final   Neisseria meningitidis NOT DETECTED NOT DETECTED Final   Pseudomonas aeruginosa NOT DETECTED NOT DETECTED Final   Candida albicans NOT DETECTED NOT DETECTED Final   Candida glabrata NOT DETECTED NOT DETECTED Final   Candida krusei NOT DETECTED NOT DETECTED Final   Candida parapsilosis NOT DETECTED NOT DETECTED Final   Candida tropicalis NOT DETECTED NOT DETECTED Final    Comment: Performed at [facility], [place] [address] [place], [place] [address]  Culture, blood (Routine x 2)     Status: Abnormal   Collection Time: 06/14/19 10:55 AM   Specimen: BLOOD  Result Value Ref Range Status   Specimen Description BLOOD SITE NOT SPECIFIED  Final   Special Requests   Final    BOTTLES DRAWN AEROBIC AND ANAEROBIC Blood Culture adequate volume   Culture  Setup Time   Final    GRAM POSITIVE COCCI IN BOTH AEROBIC AND ANAEROBIC BOTTLES CRITICAL VALUE NOTED.  VALUE IS CONSISTENT WITH PREVIOUSLY REPORTED AND CALLED VALUE.    Culture (A)  Final    STAPHYLOCOCCUS AUREUS SUSCEPTIBILITIES PERFORMED ON PREVIOUS CULTURE WITHIN THE LAST 5  DAYS. Performed at [facility], [place] [address], [place], [place] [address]    Report Status 06/16/2019 FINAL  Final  Culture, Urine     Status: Abnormal   Collection Time: 06/14/19 12:15 PM   Specimen: Urine, Random  Result Value Ref Range Status   Specimen Description URINE, RANDOM  Final   Special Requests   Final    NONE Performed at [facility], [place] [address] [place], [place] [address]    Culture (A)  Final    80,000 COLONIES/mL METHICILLIN RESISTANT STAPHYLOCOCCUS AUREUS   Report Status 06/17/2019 FINAL  Final   Organism ID, Bacteria METHICILLIN RESISTANT STAPHYLOCOCCUS AUREUS (A)  Final      Susceptibility   Methicillin resistant staphylococcus aureus - MIC*    CIPROFLOXACIN >=8 RESISTANT Resistant     GENTAMICIN <=0.5 SENSITIVE Sensitive     NITROFURANTOIN <=16 SENSITIVE Sensitive     OXACILLIN >=4 RESISTANT Resistant     TETRACYCLINE <=1 SENSITIVE Sensitive     VANCOMYCIN 1 SENSITIVE Sensitive     TRIMETH/SULFA <=10 SENSITIVE Sensitive     CLINDAMYCIN <=0.25 SENSITIVE Sensitive     RIFAMPIN <=0.5 SENSITIVE Sensitive     Inducible Clindamycin NEGATIVE Sensitive     * 80,000 COLONIES/mL METHICILLIN RESISTANT STAPHYLOCOCCUS AUREUS  SARS Coronavirus 2     Status: None   Collection Time: 06/14/19 12:34 PM  Result Value Ref Range Status   SARS Coronavirus 2 NOT DETECTED NOT DETECTED Final    Comment: (NOTE) SARS-CoV-2 target nucleic acids are NOT DETECTED. The SARS-CoV-2 RNA is generally detectable in upper and lower respiratory specimens during the acute phase of infection.  Negative  results do not preclude SARS-CoV-2 infection, do not rule out co-infections with other pathogens, and should not be used as the sole basis for treatment or other patient management decisions.  Negative results must be combined with clinical observations, patient history, and epidemiological information. The expected result is Not Detected. Fact Sheet for Patients:  http://www.biofiredefense.com/wp-content/uploads/2020/03/BIOFIRE-COVID -19-patients.pdf Fact Sheet for Healthcare Providers: http://www.biofiredefense.com/wp-content/uploads/2020/03/BIOFIRE-COVID -19-hcp.pdf This test is  not yet approved or cleared by the [place] and  has been authorized for detection and/or diagnosis of SARS-CoV-2 by FDA under an Emergency Use Authorization (EUA).  This EUA will remain in effec t (meaning this test can be used) for the duration of  the COVID-19 declaration under Section 564(b)(1) of the Act, 21 U.S.C. section 360bbb-3(b)(1), unless the authorization is terminated or revoked sooner. Performed at [facility], [place] [address] [place], [place] [address]   Novel Coronavirus, NAA (hospital order; send-out to ref lab)     Status: None   Collection Time: 06/14/19  6:37 PM   Specimen: Nasopharyngeal Swab; Respiratory  Result Value Ref Range Status   SARS-CoV-2, NAA NOT DETECTED NOT DETECTED Final    Comment: (NOTE) Testing was performed using the cobas(R) SARS-CoV-2 test. This test was developed and its performance characteristics determined by [employer]. This test has not been FDA cleared or approved. This test has been authorized by FDA under an Emergency Use Authorization (EUA). This test is only authorized for the duration of time the declaration that circumstances exist justifying the authorization of the emergency use of in vitro diagnostic tests for detection of SARS-CoV-2 virus and/or diagnosis of COVID-19 infection under section 564(b)(1) of the Act, 21 U.S.C. 191YNW-2(N)(5), unless the authorization is terminated or revoked sooner. When diagnostic testing is negative, the possibility of a false negative result should be considered in the context of a patient's recent exposures and the presence of clinical signs and symptoms consistent with COVID-19. An individual without symptoms of COVID-19 and who is not shedding  SARS-CoV-2 virus would expect to have  a negative (not detected) result in this assay. Performed At: [facility] [address] [place], [place] [id number] Rush Farmer MD QI:[id number]    [place]  Final    Comment: Performed at [facility], [place] [address] [place], [place] [address]  Culture, blood (routine x 2)     Status: None   Collection Time: 06/15/19  4:00 PM   Specimen: BLOOD  Result Value Ref Range Status   Specimen Description BLOOD RIGHT ANTECUBITAL  Final   Special Requests   Final    BOTTLES DRAWN AEROBIC ONLY Blood Culture adequate volume   Culture   Final    NO GROWTH 5 DAYS Performed at [facility], [place] [address] [place], [place] [address]    Report Status 06/20/2019 FINAL  Final  Culture, blood (routine x 2)     Status: None   Collection Time: 06/15/19  4:04 PM   Specimen: BLOOD LEFT HAND  Result Value Ref Range Status   Specimen Description BLOOD LEFT HAND  Final   Special Requests   Final    BOTTLES DRAWN AEROBIC ONLY Blood Culture adequate volume   Culture   Final    NO GROWTH 5 DAYS Performed at [facility], [place] [address], [place], [place] [address]    Report Status 06/20/2019 FINAL  Final  Aerobic Culture (superficial specimen)     Status: None   Collection Time: 06/16/19  2:20 PM   Specimen: Wound  Result Value Ref Range Status   Specimen Description WOUND CHEST  Final   Special Requests Normal  Final   Gram Stain   Final    RARE WBC PRESENT, PREDOMINANTLY MONONUCLEAR RARE GRAM POSITIVE COCCI Performed at [facility], [address], [place],  [address]    Culture FEW METHICILLIN RESISTANT STAPHYLOCOCCUS  AUREUS  Final   Report Status 06/18/2019 FINAL  Final   Organism ID, Bacteria METHICILLIN RESISTANT STAPHYLOCOCCUS AUREUS  Final      Susceptibility   Methicillin resistant staphylococcus aureus - MIC*    CIPROFLOXACIN >=8 RESISTANT Resistant     ERYTHROMYCIN >=8  RESISTANT Resistant     GENTAMICIN <=0.5 SENSITIVE Sensitive     OXACILLIN >=4 RESISTANT Resistant     TETRACYCLINE <=1 SENSITIVE Sensitive     VANCOMYCIN 1 SENSITIVE Sensitive     TRIMETH/SULFA <=10 SENSITIVE Sensitive     CLINDAMYCIN <=0.25 SENSITIVE Sensitive     RIFAMPIN <=0.5 SENSITIVE Sensitive     Inducible Clindamycin NEGATIVE Sensitive     * FEW METHICILLIN RESISTANT STAPHYLOCOCCUS AUREUS  Culture, blood (routine x 2)     Status: Abnormal   Collection Time: 06/17/19 10:25 AM   Specimen: BLOOD LEFT HAND  Result Value Ref Range Status   Specimen Description BLOOD LEFT HAND  Final   Special Requests   Final    BOTTLES DRAWN AEROBIC ONLY Blood Culture adequate volume   Culture  Setup Time   Final    GRAM POSITIVE COCCI IN CLUSTERS AEROBIC BOTTLE ONLY CRITICAL VALUE NOTED.  VALUE IS CONSISTENT WITH PREVIOUSLY REPORTED AND CALLED VALUE. Performed at [facility], [place] [address], [place], [place] [address]    Culture METHICILLIN RESISTANT STAPHYLOCOCCUS AUREUS (A)  Final   Report Status 06/20/2019 FINAL  Final   Organism ID, Bacteria METHICILLIN RESISTANT STAPHYLOCOCCUS AUREUS  Final      Susceptibility   Methicillin resistant staphylococcus aureus - MIC*    CIPROFLOXACIN >=8 RESISTANT Resistant     ERYTHROMYCIN >=8 RESISTANT Resistant     GENTAMICIN <=0.5 SENSITIVE Sensitive     OXACILLIN >=4 RESISTANT Resistant     TETRACYCLINE <=1 SENSITIVE Sensitive     VANCOMYCIN 1 SENSITIVE Sensitive     TRIMETH/SULFA <=10 SENSITIVE Sensitive     CLINDAMYCIN <=0.25 SENSITIVE Sensitive     RIFAMPIN <=0.5 SENSITIVE Sensitive     Inducible Clindamycin NEGATIVE Sensitive     * METHICILLIN RESISTANT STAPHYLOCOCCUS AUREUS  Culture, blood (routine x 2)     Status: Abnormal   Collection Time: 06/17/19 10:30 AM   Specimen: BLOOD RIGHT HAND  Result Value Ref Range Status   Specimen Description BLOOD RIGHT HAND  Final   Special Requests   Final    BOTTLES DRAWN AEROBIC ONLY Blood  Culture adequate volume   Culture  Setup Time   Final    GRAM POSITIVE COCCI AEROBIC BOTTLE ONLY CRITICAL VALUE NOTED.  VALUE IS CONSISTENT WITH PREVIOUSLY REPORTED AND CALLED VALUE. Performed at [facility], [place] [address] [place], [place] [address]    Culture STAPHYLOCOCCUS AUREUS (A)  Final   Report Status 06/20/2019 FINAL  Final  Culture, blood (Routine X 2) w Reflex to ID Panel     Status: None (Preliminary result)   Collection Time: 06/20/19 12:41 AM   Specimen: BLOOD RIGHT HAND  Result Value Ref Range Status   Specimen Description BLOOD RIGHT HAND  Final   Special Requests   Final    BOTTLES DRAWN AEROBIC ONLY Blood Culture adequate volume   Culture   Final    NO GROWTH 3 DAYS Performed at [facility], [place] [address], [place], [place] [address]    Report Status PENDING  Incomplete  Culture, blood (Routine X 2) w Reflex to ID Panel     Status: None (Preliminary result)  Collection Time: 06/20/19 12:51 AM   Specimen: BLOOD LEFT HAND  Result Value Ref Range Status   Specimen Description BLOOD LEFT HAND  Final   Special Requests   Final    BOTTLES DRAWN AEROBIC AND ANAEROBIC Blood Culture adequate volume   Culture   Final    NO GROWTH 3 DAYS Performed at [facility], [address], [place], [place] [address]    Report Status PENDING  Incomplete  MRSA PCR Screening     Status: None   Collection Time: 06/21/19  9:22 AM   Specimen: Nasal Mucosa; Nasopharyngeal  Result Value Ref Range Status   MRSA by PCR NEGATIVE NEGATIVE Final    Comment:        The GeneXpert MRSA Assay (FDA approved for NASAL specimens only), is one component of a comprehensive MRSA colonization surveillance program. It is not intended to diagnose MRSA infection nor to guide or monitor treatment for MRSA infections. Performed at [facility], [place] [address], [place],  [address]      Terri Piedra, [place] [facility] [place]  [facility] [phone] Pager  06/23/2019  3:16 PM

## 2019-06-23 NOTE — Progress Notes (Addendum)
   Subjective:  Ms. Fogleman stated that she is feeling well today. Overnight she got 3 doses of dilaudid. She has not eaten much due to her abdominal pain. She had some more cramp overnight that got batter after she had bowel movement and with K pad. No other complaint.  Objective:  Vital signs in last 24 hours: Vitals:   06/22/19 1931 06/22/19 1932 06/22/19 2314 06/23/19 0539  BP:  118/84 114/82 116/83  Pulse:  (!) 101 91 96  Resp:  (!) 25 20 (!) 23  Temp: 97.9 F (36.6 C) 97.9 F (36.6 C) 97.8 F (36.6 C) 98 F (36.7 C)  TempSrc: Oral Oral Oral Axillary  SpO2:  98% 97% 98%  Weight:    60.8 kg  Height:       General:No acute distress CV: RRR, 1+ pitting/nonpitting upper and lower extremity edema (improved than yesterday) Pulm: normal work of breathing. No crackle Abdomen: Soft, nontender to palpation Neuro exam: A and oriented x 3  Assessment/Plan:  Principal Problem:   MRSA bacteremia Active Problems:   Opioid overdose ([place])   Septic pulmonary embolism, bilateral    Suspected endocarditis   Active intravenous drug use   Severe opioid dependence ([facility])   Pericardial effusion   Rash/skin eruption  Ms. Welp is a 38 year old female withpolysubstance abuse including IVheroin, bipolar, history of thyroid cancer who presents with chest pain and dyspneaand found to have bilateral septic pulmonary emboliwith severe pulmonary infection and MRSA bacteremia. Complicated with acute hypercapnic respiratory failure, and transferred to ICU and got Bi PAP came back to medical floor yesterday.  Acute hypercarbic respiratory failure  Resolved  -Nasal O2 -C-pap at night   MRSA bacteremiaw/ septicemboli to lungs, paraspinal muscles &ileum.  Probably hx of rightsided endocarditiswith no acute vegetations on TEE.  BC 6/14MRSA positive BC 6/15 no growth6 days BC 6/17MRSA growing in 4/4 bottles  BC 6/20 negative x 1  Clinicaly improving. No fever. Had some  leukocytosis today that seems to be due to concentration (all 3 cell lines are increased today and can be due to concentrated blood). Will monitor CBC. -Continue vancomycin (day9)  -Encourage to drink Ensure  Right-sided pleural effusion concerning for early empyema -Repeated CT chest 6/23: Moderate volume bilateral pleural effusions, increased from previous exam. Within the limitations of unenhanced technique no definite loculation identified. -Consulted CT surgery and talked to Dr. Koleen Nimrod about patient. Appreciate recommendations about if drainage is needed.   Intermittent SVT with bradycardia: No further episode  TEE without acute vegetations. Likely driven by underlying multifocal pneumonia. Dr. Debara [place] has evaluated patient and recommended prn iv metoprolol if sustained and hemodynamically significant svt.  Acute normocytic anemia Hb stable>7.0 -CBC daily  Opioid Use Disorder: Constipation and abdominal distention: Mild ileus and moderate stool burden on abdominal Xray: Improved  off of Methadon Still gets low dose of IV Dilaudid. Had some cramp yesterday that improved. Abdominal distension is much better today.  -Continue with IV Dilaudid q6 h PRN -stopped methadone 6/20 -IncreasedBuspirone 10 mg BIDto 15mg  bid -CtHydroxyzine 25 mg TID PRNfor anxiety -continue pt and ot -Continue bentyl 20mg  bid PRN   Vesicular rash improved Culture with WBC and rare gr positive cocci. Likley due to MRSA bacteremia. -Varicella PCR is pending -Continue to monitor, pain medications as above  Dispo: Anticipated discharge in approximately 5 weeks  Dewayne Hatch, MD 06/23/2019, 7:10 AM Pager: [phone]

## 2019-06-23 NOTE — Progress Notes (Signed)
PT Cancellation Note  Patient Details Name: Monica Stephens MRN: [id number] DOB: 03-06-1981   Cancelled Treatment:     Pt just had bowel incontinence and had to get OOB and sit on BSC so bed could be changed. She said too tired to do anything with me today.  Nursing aide said she cried whole time sitting up and wanted to stay in dirty bed.  PT will try back tomorrow.   Loyal Buba 06/23/2019, 2:03 PM

## 2019-06-23 NOTE — Progress Notes (Signed)
Chart reviewed for LOS B Kennith Morss RN,MHA,BSN Advance Care Supervisor [phone] 

## 2019-06-23 NOTE — Progress Notes (Signed)
Pt has complained of abdominal pain since yesterday.  States it feels like cramping and none of the pain medicines ordered has helped to relieve it.  Abdomen is distended but soft.  On call physician was paged and ordered a K pad and calcium carbonate (TUMS) 400 mg tablet by mouth once.  Will continue to monitor.  Lupita Dawn, RN

## 2019-06-24 ENCOUNTER — Inpatient Hospital Stay (HOSPITAL_COMMUNITY): Payer: Self-pay

## 2019-06-24 ENCOUNTER — Inpatient Hospital Stay: Payer: Self-pay

## 2019-06-24 ENCOUNTER — Encounter (HOSPITAL_COMMUNITY): Payer: Self-pay | Admitting: Student

## 2019-06-24 HISTORY — PX: IR THORACENTESIS ASP PLEURAL SPACE W/IMG GUIDE: IMG5380

## 2019-06-24 LAB — CBC
HCT: 26.4 % — ABNORMAL LOW (ref 36.0–46.0)
Hemoglobin: 8.3 g/dL — ABNORMAL LOW (ref 12.0–15.0)
MCH: 28.9 pg (ref 26.0–34.0)
MCHC: 31.4 g/dL (ref 30.0–36.0)
MCV: 92 fL (ref 80.0–100.0)
Platelets: 417 10*3/uL — ABNORMAL HIGH (ref 150–400)
RBC: 2.87 MIL/uL — ABNORMAL LOW (ref 3.87–5.11)
RDW: 17.3 % — ABNORMAL HIGH (ref 11.5–15.5)
WBC: 15.7 10*3/uL — ABNORMAL HIGH (ref 4.0–10.5)
nRBC: 0.3 % — ABNORMAL HIGH (ref 0.0–0.2)

## 2019-06-24 LAB — BASIC METABOLIC PANEL
Anion gap: 8 (ref 5–15)
BUN: 28 mg/dL — ABNORMAL HIGH (ref 6–20)
CO2: 27 mmol/L (ref 22–32)
Calcium: 7.6 mg/dL — ABNORMAL LOW (ref 8.9–10.3)
Chloride: 102 mmol/L (ref 98–111)
Creatinine, Ser: 0.65 mg/dL (ref 0.44–1.00)
GFR calc Af Amer: >60 mL/min (ref >60)
GFR calc non Af Amer: >60 mL/min (ref >60)
Glucose, Bld: 95 mg/dL (ref 70–99)
Potassium: 4.2 mmol/L (ref 3.5–5.1)
Sodium: 137 mmol/L (ref 135–145)

## 2019-06-24 LAB — ALBUMIN, PLEURAL OR PERITONEAL FLUID: Albumin, Fluid: <1 g/dL

## 2019-06-24 LAB — GRAM STAIN

## 2019-06-24 LAB — LACTATE DEHYDROGENASE, PLEURAL OR PERITONEAL FLUID: LD, Fluid: 197 U/L — ABNORMAL HIGH (ref 3–23)

## 2019-06-24 LAB — AMYLASE, PLEURAL OR PERITONEAL FLUID: Amylase, Fluid: 48 U/L

## 2019-06-24 LAB — BODY FLUID CELL COUNT WITH DIFFERENTIAL
Lymphs, Fluid: 26 %
Monocyte-Macrophage-Serous Fluid: 3 % — ABNORMAL LOW (ref 50–90)
Neutrophil Count, Fluid: 71 % — ABNORMAL HIGH (ref 0–25)
Total Nucleated Cell Count, Fluid: 980 cu mm (ref 0–1000)

## 2019-06-24 LAB — PROTEIN, PLEURAL OR PERITONEAL FLUID: Total protein, fluid: <3 g/dL

## 2019-06-24 LAB — SARS CORONAVIRUS 2 BY RT PCR (HOSPITAL ORDER, PERFORMED IN ~~LOC~~ HOSPITAL LAB): SARS Coronavirus 2: NEGATIVE

## 2019-06-24 LAB — PROTEIN, TOTAL: Total Protein: 4.4 g/dL — ABNORMAL LOW (ref 6.5–8.1)

## 2019-06-24 LAB — GLUCOSE, PLEURAL OR PERITONEAL FLUID: Glucose, Fluid: 85 mg/dL

## 2019-06-24 LAB — LACTATE DEHYDROGENASE: LDH: 194 U/L — ABNORMAL HIGH (ref 98–192)

## 2019-06-24 LAB — ALBUMIN: Albumin: 1.2 g/dL — ABNORMAL LOW (ref 3.5–5.0)

## 2019-06-24 IMAGING — DX CHEST  1 VIEW
1 series · 1 of 1 positions shown · non-contrast
Comparison: Radiograph [DATE].

CLINICAL DATA: Pleural effusion.

EXAM:
CHEST  1 VIEW

[chest pa]
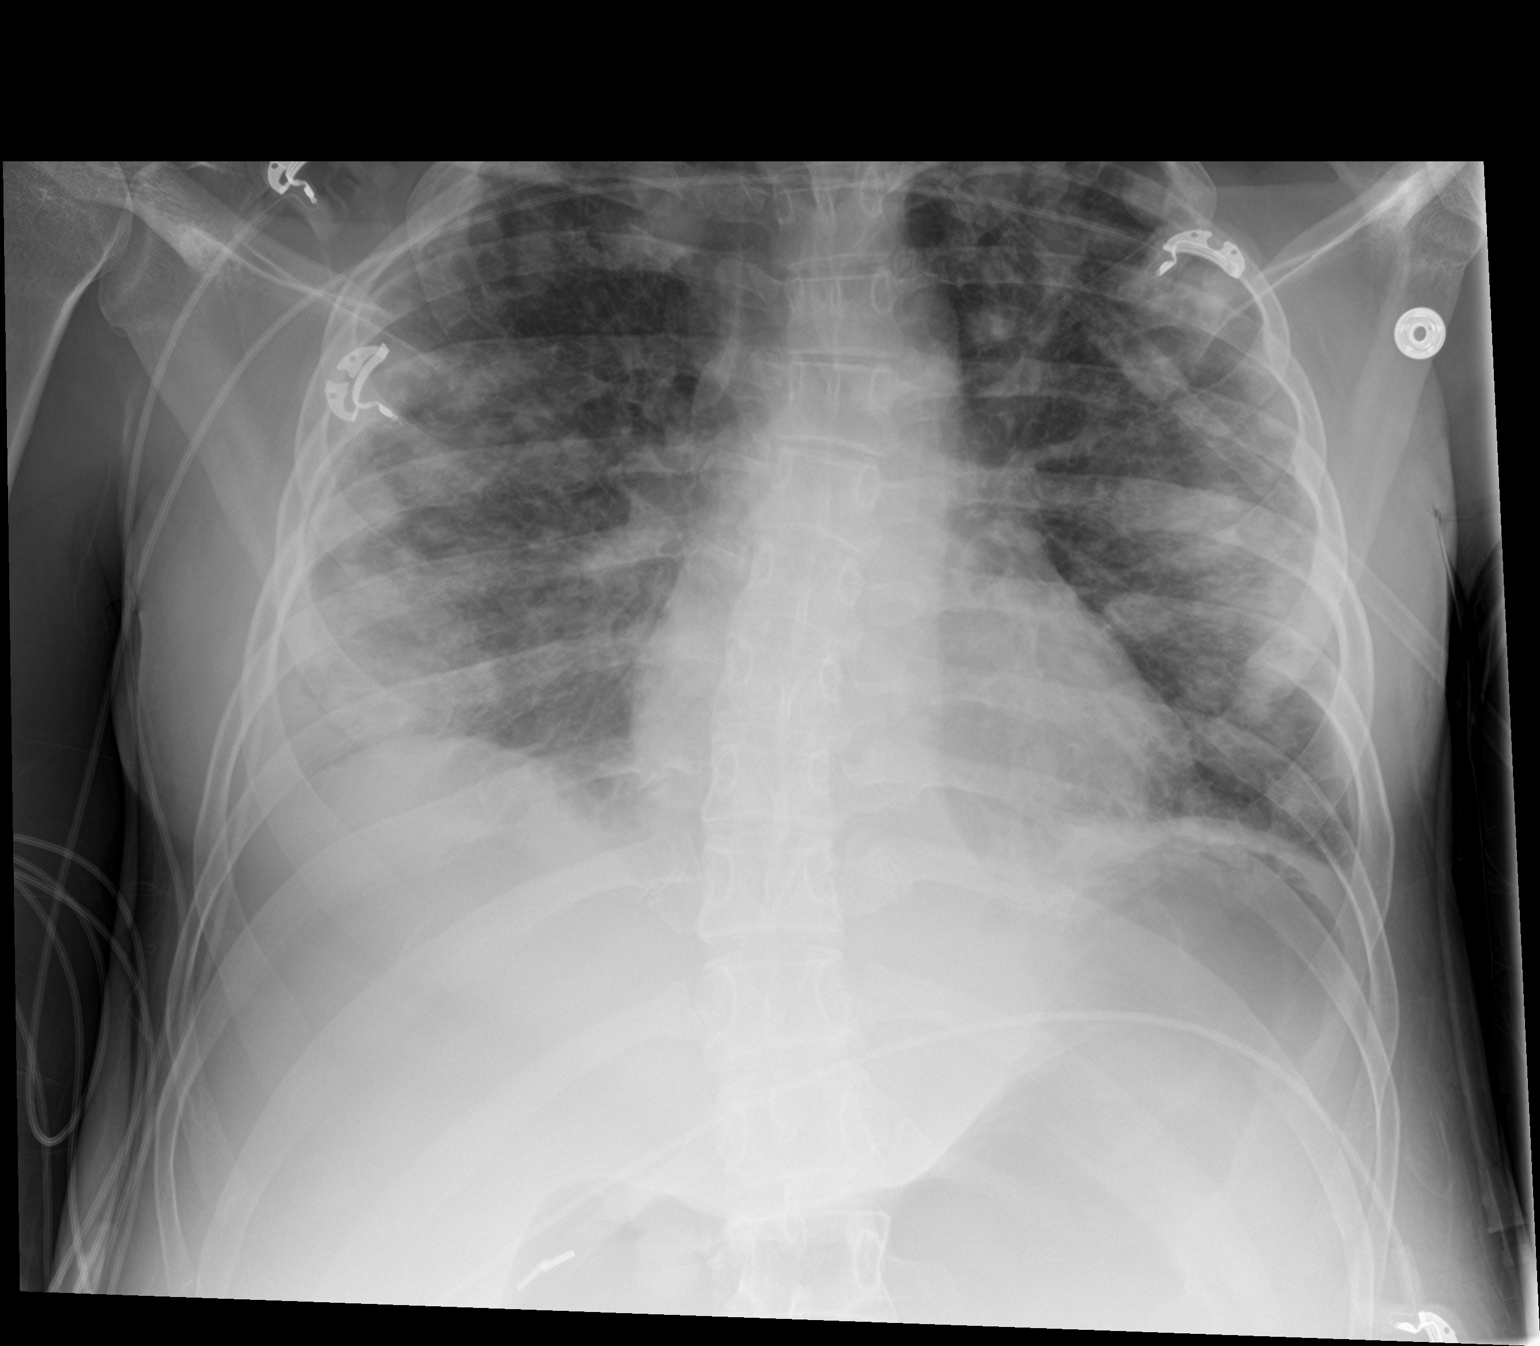

[1 of 1 positions shown; findings below may reference images not displayed]

FINDINGS: The heart size and mediastinal contours are within normal limits. No
pneumothorax or significant pleural effusion is noted. Stable
bilateral diffuse lung opacities are noted. The visualized skeletal
structures are unremarkable.
IMPRESSION: No pneumothorax status post pleural effusion. Status post bilateral
lung opacities are noted consistent with multifocal pneumonia.

## 2019-06-24 IMAGING — US IR THORACENTESIS ASP PLEURAL SPACE W/IMG GUIDE
1 series · 3 of 3 positions shown · non-contrast
Comparison: none

INDICATION: Patient with history of IVDA with multiple septic pulmonary emboli,
dyspnea, and bilateral pleural effusions. Request is made for
diagnostic and therapeutic right thoracentesis.

[Series 1: ir thoracentesis asp pleural space w/img guide · 3 of 3 slices shown]
[im 1/3]
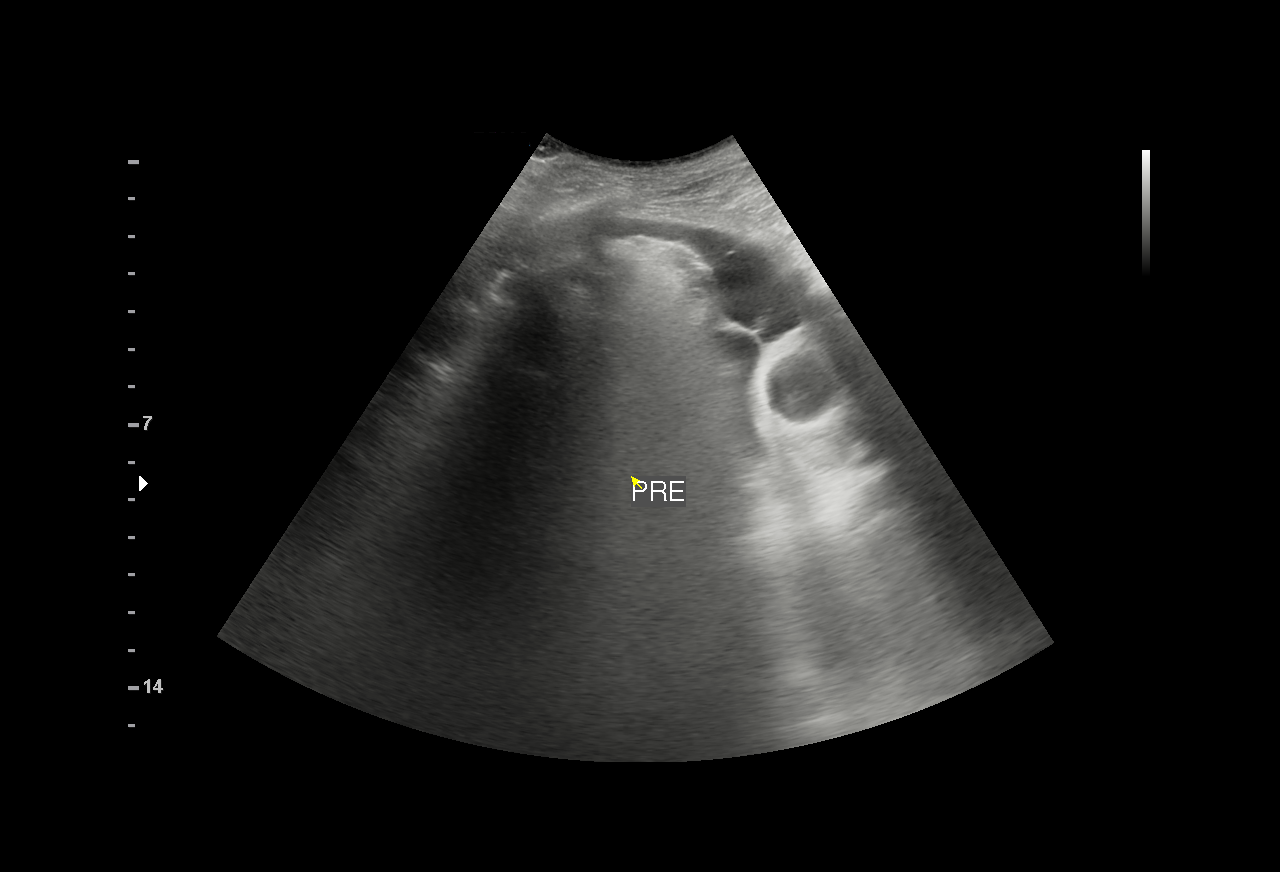
[im 2/3]
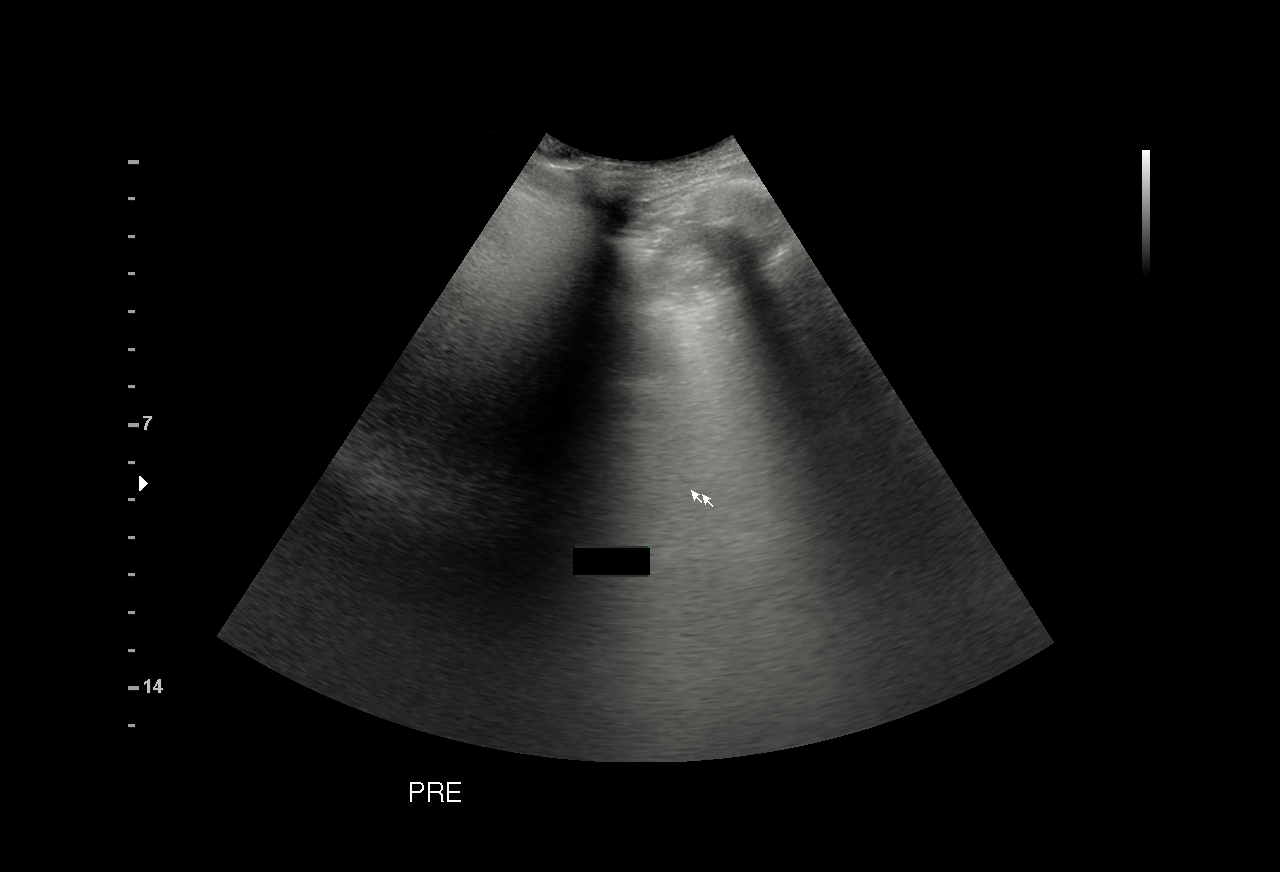
[im 3/3]
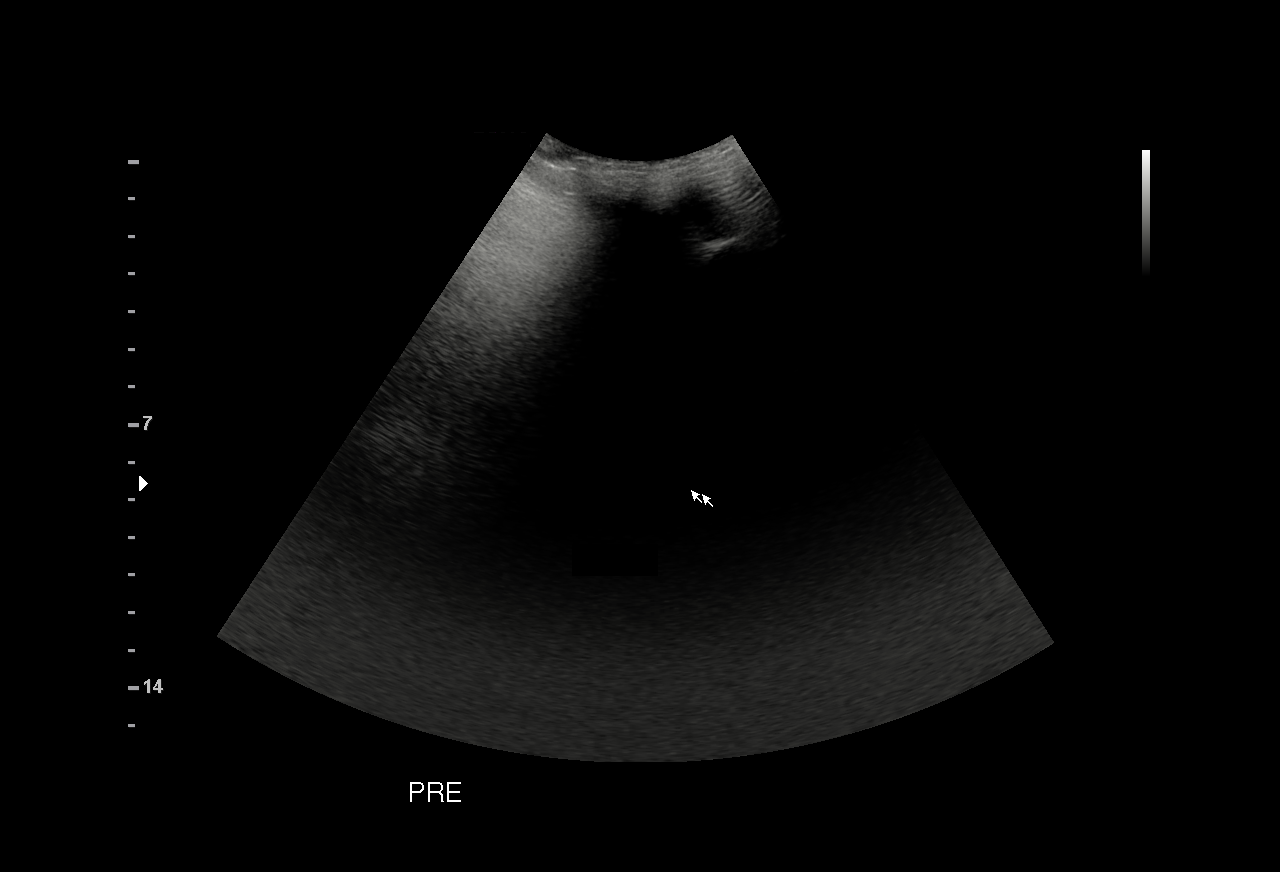

[3 of 3 positions shown; findings below may reference images not displayed]

EXAM:
ULTRASOUND GUIDED DIAGNOSTIC AND THERAPEUTIC RIGHT THORACENTESIS

MEDICATIONS:
8 mL 1% lidocaine

COMPLICATIONS:
None immediate.

PROCEDURE:
An ultrasound guided thoracentesis was thoroughly discussed with the
patient and questions answered. The benefits, risks, alternatives
and complications were also discussed. The patient understands and
wishes to proceed with the procedure. Written consent was obtained.

Ultrasound was performed to localize and mark an adequate pocket of
fluid in the left chest. The area was then prepped and draped in the
normal sterile fashion. 1% Lidocaine was used for local anesthesia.
Under ultrasound guidance a 6 Fr Safe-T-Centesis catheter was
introduced by Dr. VAN. Thoracentesis was performed. The
catheter was removed and a dressing applied.
FINDINGS: A total of approximately 350 mL of dark red fluid was removed.
Samples were sent to the laboratory as requested by the clinical
team.
IMPRESSION: Successful ultrasound guided left thoracentesis yielding 350 mL of
pleural fluid.

## 2019-06-24 MED ORDER — LIDOCAINE HCL 1 % IJ SOLN
INTRAMUSCULAR | Status: AC
  Filled 2019-06-24: qty 20

## 2019-06-24 MED ORDER — DICYCLOMINE HCL 20 MG PO TABS
20.0000 mg | ORAL_TABLET | Freq: Two times a day (BID) | ORAL | Status: DC | PRN
  Administered 2019-06-24 – 2019-06-26 (×2): 20 mg via ORAL
  Filled 2019-06-24 (×3): qty 1

## 2019-06-24 MED ORDER — HYDROMORPHONE HCL 1 MG/ML IJ SOLN
0.5000 mg | Freq: Once | INTRAMUSCULAR | Status: AC
  Administered 2019-06-24: 0.5 mg via INTRAVENOUS
  Filled 2019-06-24: qty 1

## 2019-06-24 MED ORDER — HYDROMORPHONE HCL 1 MG/ML IJ SOLN
0.5000 mg | Freq: Four times a day (QID) | INTRAMUSCULAR | Status: DC | PRN
  Administered 2019-06-24 – 2019-06-25 (×4): 0.5 mg via INTRAVENOUS
  Filled 2019-06-24 (×4): qty 1

## 2019-06-24 MED ORDER — LIDOCAINE HCL (PF) 1 % IJ SOLN
INTRAMUSCULAR | Status: DC | PRN
  Administered 2019-06-24: 10 mL

## 2019-06-24 NOTE — Progress Notes (Signed)
[name]  Date of Admission:  06/14/2019     Total days of antibiotics 11         ASSESSMENT/PLAN  Monica Stephens has bilateral pulmonary septic emboli and suspected endocarditis complicated by MRSA bacteremia.  Course has been complicated by hypercapnia with now pleural effusion with concern for loculation.  She is on nasal cannula at present and scheduled to undergo thoracentesis.  MRSA bacteremia -repeat cultures remain without growth to date.  Has disseminated infection and will require prolonged therapy.  Continue current dose of vancomycin.  Pleural effusion/loculation -scheduled for thoracentesis today.  This should help her breathing easier.  SPO2 stable.  Remains on 2 L of oxygen via nasal cannula.  Therapeutic drug monitoring -renal function remained stable with no evidence of nephrotoxicity.  Continue area under the curve monitoring per pharmacy.   Principal Problem:   MRSA bacteremia Active Problems:   Opioid overdose ([place])   Septic pulmonary embolism, bilateral    Suspected endocarditis   Active intravenous drug use   Severe opioid dependence ([place])   Pericardial effusion   Rash/skin eruption   . busPIRone  10 mg Oral BID  . calcium carbonate  400 mg of elemental calcium Oral Once  . docusate  50 mg Oral Daily  . feeding supplement (ENSURE ENLIVE)  237 mL Oral TID BM  . heparin  5,000 Units Subcutaneous Q8H  . levothyroxine  150 mcg Oral Once per day on Sun Tue Wed Thu Fri Sat  . levothyroxine  225 mcg Oral Q Mon  . lidocaine      . multivitamin with minerals  1 tablet Oral Daily  . mupirocin cream   Topical BID  . polyethylene glycol  17 g Oral Daily    SUBJECTIVE:  Afebrile overnight. Does have slowly increasing leukocytosis. Resting in bed.   No Known Allergies   Review of Systems: Review of Systems  Constitutional: Negative for chills, fever and weight loss.  Respiratory: Negative for cough, shortness of breath and  wheezing.   Cardiovascular: Negative for chest pain and leg swelling.  Gastrointestinal: Negative for abdominal pain, constipation, diarrhea, nausea and vomiting.  Skin: Negative for rash.      OBJECTIVE: Vitals:   06/23/19 1930 06/24/19 0045 06/24/19 0523 06/24/19 1100  BP: 113/72 116/74 127/87   Pulse: 95 93 93   Resp: (!) 24 (!) 21 (!) 24 (!) 27  Temp: 98.3 F (36.8 C) 98 F (36.7 C) 97.6 F (36.4 C)   TempSrc: Oral Oral Oral   SpO2: 98% 100% 100%   Weight:   59 kg   Height:       Body mass index is 23.79 kg/m.  Physical Exam Constitutional:      General: She is not in acute distress.    Appearance: She is well-developed. She is ill-appearing.     Interventions: Nasal cannula in place.     Comments: Lying in bed with head of bed elevated; arousable  Cardiovascular:     Rate and Rhythm: Regular rhythm. Tachycardia present.     Heart sounds: Normal heart sounds.  Pulmonary:     Effort: Pulmonary effort is normal. Tachypnea present.     Breath sounds: Normal breath sounds.  Skin:    General: Skin is warm and dry.  Neurological:     Mental Status: She is alert and oriented to person, place, and time.  Psychiatric:        Behavior: Behavior normal.  Thought Content: Thought content normal.        Judgment: Judgment normal.     Lab Results Lab Results  Component Value Date   WBC 15.7 (H) 06/24/2019   HGB 8.3 (L) 06/24/2019   HCT 26.4 (L) 06/24/2019   MCV 92.0 06/24/2019   PLT 417 (H) 06/24/2019    Lab Results  Component Value Date   CREATININE 0.65 06/24/2019   BUN 28 (H) 06/24/2019   NA 137 06/24/2019   K 4.2 06/24/2019   CL 102 06/24/2019   CO2 27 06/24/2019    Lab Results  Component Value Date   ALT 20 06/21/2019   AST 33 06/21/2019   ALKPHOS 87 06/21/2019   BILITOT 0.3 06/21/2019     Microbiology: Recent Results (from the past 240 hour(s))  Novel Coronavirus, NAA (hospital order; send-out to ref lab)     Status: None   Collection  Time: 06/14/19  6:37 PM   Specimen: Nasopharyngeal Swab; Respiratory  Result Value Ref Range Status   SARS-CoV-2, NAA NOT DETECTED NOT DETECTED Final    Comment: (NOTE) Testing was performed using the cobas(R) SARS-CoV-2 test. This test was developed and its performance characteristics determined by [employer]. This test has not been FDA cleared or approved. This test has been authorized by FDA under an Emergency Use Authorization (EUA). This test is only authorized for the duration of time the declaration that circumstances exist justifying the authorization of the emergency use of in vitro diagnostic tests for detection of SARS-CoV-2 virus and/or diagnosis of COVID-19 infection under section 564(b)(1) of the Act, 21 U.S.C. 517OHY-0(V)(3), unless the authorization is terminated or revoked sooner. When diagnostic testing is negative, the possibility of a false negative result should be considered in the context of a patient's recent exposures and the presence of clinical signs and symptoms consistent with COVID-19. An individual without symptoms of COVID-19 and who is not shedding SARS-CoV-2 virus would expect to have  a negative (not detected) result in this assay. Performed At: [facility] [address] [place], [place] [id number] Rush Farmer MD NI:[id number]    [place]  Final    Comment: Performed at [facility], [place] [address], [place], [place] [address]  Culture, blood (routine x 2)     Status: None   Collection Time: 06/15/19  4:00 PM   Specimen: BLOOD  Result Value Ref Range Status   Specimen Description BLOOD RIGHT ANTECUBITAL  Final   Special Requests   Final    BOTTLES DRAWN AEROBIC ONLY Blood Culture adequate volume   Culture   Final    NO GROWTH 5 DAYS Performed at [facility], [place] [address] [place], [place] [address]    Report Status 06/20/2019 FINAL  Final  Culture, blood (routine x 2)     Status:  None   Collection Time: 06/15/19  4:04 PM   Specimen: BLOOD LEFT HAND  Result Value Ref Range Status   Specimen Description BLOOD LEFT HAND  Final   Special Requests   Final    BOTTLES DRAWN AEROBIC ONLY Blood Culture adequate volume   Culture   Final    NO GROWTH 5 DAYS Performed at [facility], [place] [address], [place], [place] [address]    Report Status 06/20/2019 FINAL  Final  Aerobic Culture (superficial specimen)     Status: None   Collection Time: 06/16/19  2:20 PM   Specimen: Wound  Result Value Ref Range Status  Specimen Description WOUND CHEST  Final   Special Requests Normal  Final   Gram Stain   Final    RARE WBC PRESENT, PREDOMINANTLY MONONUCLEAR RARE GRAM POSITIVE COCCI Performed at [facility], [place] [address] [place], [place] [address]    Culture FEW METHICILLIN RESISTANT STAPHYLOCOCCUS AUREUS  Final   Report Status 06/18/2019 FINAL  Final   Organism ID, Bacteria METHICILLIN RESISTANT STAPHYLOCOCCUS AUREUS  Final      Susceptibility   Methicillin resistant staphylococcus aureus - MIC*    CIPROFLOXACIN >=8 RESISTANT Resistant     ERYTHROMYCIN >=8 RESISTANT Resistant     GENTAMICIN <=0.5 SENSITIVE Sensitive     OXACILLIN >=4 RESISTANT Resistant     TETRACYCLINE <=1 SENSITIVE Sensitive     VANCOMYCIN 1 SENSITIVE Sensitive     TRIMETH/SULFA <=10 SENSITIVE Sensitive     CLINDAMYCIN <=0.25 SENSITIVE Sensitive     RIFAMPIN <=0.5 SENSITIVE Sensitive     Inducible Clindamycin NEGATIVE Sensitive     * FEW METHICILLIN RESISTANT STAPHYLOCOCCUS AUREUS  Culture, blood (routine x 2)     Status: Abnormal   Collection Time: 06/17/19 10:25 AM   Specimen: BLOOD LEFT HAND  Result Value Ref Range Status   Specimen Description BLOOD LEFT HAND  Final   Special Requests   Final    BOTTLES DRAWN AEROBIC ONLY Blood Culture adequate volume   Culture  Setup Time   Final    GRAM POSITIVE COCCI IN CLUSTERS AEROBIC BOTTLE ONLY CRITICAL VALUE NOTED.  VALUE IS  CONSISTENT WITH PREVIOUSLY REPORTED AND CALLED VALUE. Performed at [facility], [place] [address] [place], [place] [address]    Culture METHICILLIN RESISTANT STAPHYLOCOCCUS AUREUS (A)  Final   Report Status 06/20/2019 FINAL  Final   Organism ID, Bacteria METHICILLIN RESISTANT STAPHYLOCOCCUS AUREUS  Final      Susceptibility   Methicillin resistant staphylococcus aureus - MIC*    CIPROFLOXACIN >=8 RESISTANT Resistant     ERYTHROMYCIN >=8 RESISTANT Resistant     GENTAMICIN <=0.5 SENSITIVE Sensitive     OXACILLIN >=4 RESISTANT Resistant     TETRACYCLINE <=1 SENSITIVE Sensitive     VANCOMYCIN 1 SENSITIVE Sensitive     TRIMETH/SULFA <=10 SENSITIVE Sensitive     CLINDAMYCIN <=0.25 SENSITIVE Sensitive     RIFAMPIN <=0.5 SENSITIVE Sensitive     Inducible Clindamycin NEGATIVE Sensitive     * METHICILLIN RESISTANT STAPHYLOCOCCUS AUREUS  Culture, blood (routine x 2)     Status: Abnormal   Collection Time: 06/17/19 10:30 AM   Specimen: BLOOD RIGHT HAND  Result Value Ref Range Status   Specimen Description BLOOD RIGHT HAND  Final   Special Requests   Final    BOTTLES DRAWN AEROBIC ONLY Blood Culture adequate volume   Culture  Setup Time   Final    GRAM POSITIVE COCCI AEROBIC BOTTLE ONLY CRITICAL VALUE NOTED.  VALUE IS CONSISTENT WITH PREVIOUSLY REPORTED AND CALLED VALUE. Performed at [facility], [place] [address], [place], [place] [address]    Culture STAPHYLOCOCCUS AUREUS (A)  Final   Report Status 06/20/2019 FINAL  Final  Culture, blood (Routine X 2) w Reflex to ID Panel     Status: None (Preliminary result)   Collection Time: 06/20/19 12:41 AM   Specimen: BLOOD RIGHT HAND  Result Value Ref Range Status   Specimen Description BLOOD RIGHT HAND  Final   Special Requests   Final    BOTTLES DRAWN AEROBIC ONLY Blood Culture adequate volume  Culture   Final    NO GROWTH 3 DAYS Performed at [facility], [place] [address], [place], [place] [address]    Report Status PENDING   Incomplete  Culture, blood (Routine X 2) w Reflex to ID Panel     Status: None (Preliminary result)   Collection Time: 06/20/19 12:51 AM   Specimen: BLOOD LEFT HAND  Result Value Ref Range Status   Specimen Description BLOOD LEFT HAND  Final   Special Requests   Final    BOTTLES DRAWN AEROBIC AND ANAEROBIC Blood Culture adequate volume   Culture   Final    NO GROWTH 3 DAYS Performed at [facility], [place] [address] [place], [place] [address]    Report Status PENDING  Incomplete  MRSA PCR Screening     Status: None   Collection Time: 06/21/19  9:22 AM   Specimen: Nasal Mucosa; Nasopharyngeal  Result Value Ref Range Status   MRSA by PCR NEGATIVE NEGATIVE Final    Comment:        The GeneXpert MRSA Assay (FDA approved for NASAL specimens only), is one component of a comprehensive MRSA colonization surveillance program. It is not intended to diagnose MRSA infection nor to guide or monitor treatment for MRSA infections. Performed at [facility], [place] [address], [place],  [address]   SARS Coronavirus 2 (CEPHEID - Performed in [place] hospital lab), Hosp Order     Status: None   Collection Time: 06/24/19 10:53 AM   Specimen: Nasopharyngeal Swab  Result Value Ref Range Status   SARS Coronavirus 2 NEGATIVE NEGATIVE Final    Comment: (NOTE) If result is NEGATIVE SARS-CoV-2 target nucleic acids are NOT DETECTED. The SARS-CoV-2 RNA is generally detectable in upper and lower  respiratory specimens during the acute phase of infection. The lowest  concentration of SARS-CoV-2 viral copies this assay can detect is 250  copies / mL. A negative result does not preclude SARS-CoV-2 infection  and should not be used as the sole basis for treatment or other  patient management decisions.  A negative result may occur with  improper specimen collection / handling, submission of specimen other  than nasopharyngeal swab, presence of viral mutation(s) within the  areas  targeted by this assay, and inadequate number of viral copies  (<250 copies / mL). A negative result must be combined with clinical  observations, patient history, and epidemiological information. If result is POSITIVE SARS-CoV-2 target nucleic acids are DETECTED. The SARS-CoV-2 RNA is generally detectable in upper and lower  respiratory specimens dur ing the acute phase of infection.  Positive  results are indicative of active infection with SARS-CoV-2.  Clinical  correlation with patient history and other diagnostic information is  necessary to determine patient infection status.  Positive results do  not rule out bacterial infection or co-infection with other viruses. If result is PRESUMPTIVE POSTIVE SARS-CoV-2 nucleic acids MAY BE PRESENT.   A presumptive positive result was obtained on the submitted specimen  and confirmed on repeat testing.  While 2019 novel coronavirus  (SARS-CoV-2) nucleic acids may be present in the submitted sample  additional confirmatory testing may be necessary for epidemiological  and / or clinical management purposes  to differentiate between  SARS-CoV-2 and other Sarbecovirus currently known to infect humans.  If clinically indicated additional testing with an alternate test  methodology [phone]) is advised. The SARS-CoV-2 RNA is generally  detectable in upper and lower respiratory sp ecimens during the acute  phase of infection. The expected result is Negative. Fact Sheet for Patients:  StrictlyIdeas.no Fact Sheet for Healthcare Providers: BankingDealers.co.za This test is not yet approved or cleared by the [place] FDA and has been authorized for detection and/or diagnosis of SARS-CoV-2 by FDA under an Emergency Use Authorization (EUA).  This EUA will remain in effect (meaning this test can be used) for the duration of the COVID-19 declaration under Section 564(b)(1) of the Act, 21 U.S.C. section  360bbb-3(b)(1), unless the authorization is terminated or revoked sooner. Performed at [facility], [place] [address] [place], [place] [address]      Terri Piedra, [place] [facility] [place] [facility] [phone] Pager  06/24/2019  2:06 PM

## 2019-06-24 NOTE — Progress Notes (Signed)
Pt a rule out covid. Unable to place on CPAP for the night. RT will continue to monitor.

## 2019-06-24 NOTE — Progress Notes (Signed)
Subjective: Patient was seen and evaluated at bedside on morning rounds. No acute events overnight.  She mentions that she is doing better.  But still needed pain medicine and overnight.  She had some nausea yesterday that required promethazine but it resolved today.  We talked about her chest CT scan yesterday.  Objective:  Vital signs in last 24 hours: Vitals:   06/23/19 1650 06/23/19 1930 06/24/19 0045 06/24/19 0523  BP:  113/72 116/74 127/87  Pulse: 96 95 93 93  Resp: (!) 23 (!) 24 (!) 21 (!) 24  Temp:  98.3 F (36.8 C) 98 F (36.7 C) 97.6 F (36.4 C)  TempSrc:  Oral Oral Oral  SpO2: 97% 98% 100% 100%  Weight:    59 kg  Height:       General: Lying in the bed in no acute distress CV: RRR, normal S1-S2, has 1-2+ bilateral lower extremity edema and 2+ nonpitting edema at rightupperextremity and 1+ nonpitting edema at left hand. Abdomen: Soft, nontender to palpation, BS present Extremities: Pulses are palpable bilaterally  Assessment/Plan:  Principal Problem:   MRSA bacteremia Active Problems:   Opioid overdose ([facility])   Septic pulmonary embolism, bilateral    Suspected endocarditis   Active intravenous drug use   Severe opioid dependence ([facility])   Pericardial effusion   Rash/skin eruption  Monica Stephens is a 38 year old female withpolysubstance abuse including IVheroin, bipolar, history of thyroid cancer who presents with chest pain and dyspneaand found to have bilateral septic pulmonary emboliwith severe pulmonary infection and MRSA bacteremia. Complicated with acute hypercapnic respiratory failure, and transferred to ICU and got Bi PAP came back to medical floor after improvement.  MRSA bacteremiaw/ septicemboli to lungs, paraspinal muscles &ileum.  Probably hx of rightsided endocarditiswith no acute vegetations on TEE.  BC 6/14MRSA positive BC 6/15 no growth[facility] 6/17MRSA growing in 4/4 bottles  BC 6/20negative x 1  Clinicaly improving. No fever.  But has consistent leukocytosis again since 6/23. Today WBC is 15700.  May be concerning for worsening of empyema? -CBC daily -Continue vancomycin (day10)  -Encourage to drink Ensure -ID is following, appreciate recommendation -Blood culture has been negative for 2 days, per ID agreement, insert PICC line today   Right-sided pleural effusion concerning for early empyema -Repeated CT chest 6/23: Moderate volume bilateral pleural effusions, increased from previous exam. Within the limitations of unenhanced technique no definite loculation identified. Consulted CT surgery and talked to Dr. Koleen Nimrod.  No indication for thoracotomy.  - IR to do an image guided thoracentesis to get fluid for evaluation.   Right upper extremity edema: No redness, now tenderness, pulses normal Likely due to IV infiltration also in setting of poor maturation and low albumin. -May remove right upper extremity IV if can successfully insert PICC line -Right upper extremity elevation -Encourage drink Ensure  Intermittent SVT with bradycardia: No further episode. TEE without acute vegetations. Likely driven by underlying multifocal pneumonia. Dr. Debara Pickett has evaluated patient and recommended prn iv metoprolol if sustained and hemodynamically significant svt.  Acute normocytic anemia Hb stableat 8.3 (which is her baseline during this hospitalization. Yesterday Hb was 10 that I think it was falsely elevated due to dehydration) -CBC daily  Opioid Use Disorder: Constipation and abdominal distention: Mild ileus and moderate stool burden on abdominal Xray: Improved  Off of Methadone 6/20 Still gets low dose of IV Dilaudid.  She continues to have generalized pain on her chest and back.  -Increase IV Dilaudid to q4 h PRN -Continue  with buspirone 15mg  bid -CtHydroxyzine 25 mg TID PRNfor anxiety -Continue PT and OT -Continue bentyl 20mg  bidPRN -Continue bowel regimen as needed  Vesicular rash improved  Culture with WBC and rare gr positive cocci. Likley due to MRSA bacteremia. -Varicella PCR is pending -Continue to monitor, pain medications as above  Dispo: Anticipated discharge in approximately 5 weeks   Dewayne Hatch, MD 06/24/2019, 6:49 AM Pager: [phone]

## 2019-06-24 NOTE — Progress Notes (Signed)
Primary RN aware PICC placement will be done tomorrow.

## 2019-06-24 NOTE — Procedures (Signed)
PROCEDURE SUMMARY:  Successful image-guided left thoracentesis. Yielded 350 milliliters of dark red fluid. Patient tolerated procedure well. No immediate complications. EBL = 5 mL.  Specimen was sent for labs. CXR ordered.  Alexandra Louk PA-C 06/24/2019 2:25 PM

## 2019-06-24 NOTE — Progress Notes (Signed)
PT Cancellation Note  Patient Details Name: Monica Stephens MRN: [id number] DOB: 10/04/1981   Cancelled Treatment:    Reason Eval/Treat Not Completed: Patient at procedure or test/unavailable. On arrival to room transport present to take pt to IR. Check back as time allows.  Benjiman Core, PTA Pager [phone] Acute Rehab   Allena Katz 06/24/2019, 1:30 PM

## 2019-06-25 DIAGNOSIS — J869 Pyothorax without fistula: Secondary | ICD-10-CM

## 2019-06-25 LAB — CBC
HCT: 27.9 % — ABNORMAL LOW (ref 36.0–46.0)
Hemoglobin: 8.7 g/dL — ABNORMAL LOW (ref 12.0–15.0)
MCH: 29.2 pg (ref 26.0–34.0)
MCHC: 31.2 g/dL (ref 30.0–36.0)
MCV: 93.6 fL (ref 80.0–100.0)
Platelets: 381 10*3/uL (ref 150–400)
RBC: 2.98 MIL/uL — ABNORMAL LOW (ref 3.87–5.11)
RDW: 18.4 % — ABNORMAL HIGH (ref 11.5–15.5)
WBC: 15.6 10*3/uL — ABNORMAL HIGH (ref 4.0–10.5)
nRBC: 0.3 % — ABNORMAL HIGH (ref 0.0–0.2)

## 2019-06-25 LAB — ACID FAST SMEAR (AFB, MYCOBACTERIA): Acid Fast Smear: NEGATIVE

## 2019-06-25 LAB — CULTURE, BLOOD (ROUTINE X 2)
Culture: NO GROWTH
Culture: NO GROWTH
Special Requests: ADEQUATE
Special Requests: ADEQUATE

## 2019-06-25 LAB — PH, BODY FLUID: pH, Body Fluid: 7.7

## 2019-06-25 LAB — BASIC METABOLIC PANEL
Anion gap: 9 (ref 5–15)
BUN: 28 mg/dL — ABNORMAL HIGH (ref 6–20)
CO2: 26 mmol/L (ref 22–32)
Calcium: 7.3 mg/dL — ABNORMAL LOW (ref 8.9–10.3)
Chloride: 101 mmol/L (ref 98–111)
Creatinine, Ser: 0.73 mg/dL (ref 0.44–1.00)
GFR calc Af Amer: >60 mL/min (ref >60)
GFR calc non Af Amer: >60 mL/min (ref >60)
Glucose, Bld: 86 mg/dL (ref 70–99)
Potassium: 3.7 mmol/L (ref 3.5–5.1)
Sodium: 136 mmol/L (ref 135–145)

## 2019-06-25 LAB — VANCOMYCIN, RANDOM: Vancomycin Rm: 27

## 2019-06-25 MED ORDER — HYDROMORPHONE HCL 1 MG/ML IJ SOLN
1.0000 mg | INTRAMUSCULAR | Status: DC | PRN
  Administered 2019-06-25 – 2019-07-05 (×59): 1 mg via INTRAVENOUS
  Filled 2019-06-25 (×61): qty 1

## 2019-06-25 NOTE — Plan of Care (Signed)
  Problem: Education: Goal: Knowledge of General Education information will improve Description Including pain rating scale, medication(s)/side effects and non-pharmacologic comfort measures Outcome: Progressing   

## 2019-06-25 NOTE — Plan of Care (Signed)
Continue to monitor
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
## 2019-06-25 NOTE — Progress Notes (Signed)
Subjective: Patient was seen and evaluated at bedside on morning rounds. Had some diarrhea and abdominal cramp overnight that improved. Still has some chest and back pain that required 5 doses of Dilaudid (totdal of 2.5 mg). Received 1 dose of Phenergan for nausea. Currently no nausea.   Objective:  Vital signs in last 24 hours: Vitals:   06/25/19 0830 06/25/19 0900 06/25/19 0930 06/25/19 0944  BP:    116/77  Pulse: 97 (!) 105 97 (!) 103  Resp: (!) 23 (!) 21 (!) 22 (!) 28  Temp:    98.1 F (36.7 C)  TempSrc:    Oral  SpO2: 99% 99% 99% 99%  Weight:      Height:       Physical Exam:  VS reviewed, nursing notes reviewed. General: NAD CV: RRR, nl S1S2, 1-2+ bilateral LEE and 1+ left UE edema and 2+ non pitting edema at right UE Pulm: Normal work of breathing, no crackle Abdomen: Soft and nontender to palpation, BS are present Skin: Rash at sternal area and abdomen improved Neurologic exam: A and O x 3  Assessment/Plan:  Principal Problem:   MRSA bacteremia Active Problems:   Opioid overdose (HCC)   Septic pulmonary embolism, bilateral    Suspected endocarditis   Active intravenous drug use   Severe opioid dependence (HCC)   Pericardial effusion   Rash/skin eruption  Ms. Levay is a 38 year old female withpolysubstance abuse including IVheroin, bipolar, history of thyroid cancer who presents with chest pain and dyspneaand found to have bilateral septic pulmonary emboliwith severe pulmonary infection and MRSA bacteremia. Complicated with acute hypercapnic respiratory failure, and transferred to ICU and got Bi PAP came back to medical floor after improvement.  MRSA bacteremiaw/ septicemboli to lungs, paraspinal muscles &ileum.  Probably hx of rightsided endocarditiswith no acute vegetations on TEE.  BC 6/14MRSA positive BC 6/15 no growthso far [facility] 6/17MRSA growing in 4/4 bottles  BC 6/20negative x 1  Clinicaly improving. No fever. But has consistent  leukocytosis again since 6/23. Today WBC is 15700.  May be concerning for worsening of empyema? -CBC daily -Continue vancomycin (day10) -Encourage to drink Ensure -ID is following, appreciate recommendation -Blood culture has been negative for 2 days, per ID agreement, insert PICC line today   Right-sided pleural effusion concerning for early empyema - thoracentesis performed yesterday by IR. Fluid LDH/serum LDH: 1.01, (197/194)consistent with exudate. Fluid Or<3 but also has low serum Pr so I do not think it is transudate. Gram stain with leukocyte, PMN. No organism.  Likely due to infection. Will also send for cytology and f/u culture. -F/u pending lab and cytology  Right upper extremity edema: Improved  Left hand swelling still present but improved. Pulses are normal. No redness and no tenderness. Still has left upper extremity IV line. (PICC line was not placed yesterday) No redness, no tenderness, pulses normal  Likely due to IV infiltration also in setting of poor maturation and low albumin. -May remove right upper extremity IV if can successfully insert PICC line today -Right upper extremity elevation -Encourage drink Ensure  Intermittent SVT with bradycardia: No further episode. TEE without acute vegetations. Likely driven by underlying multifocal pneumonia. Dr. Debara Pickett has evaluated patient and recommended prn iv metoprolol if sustained and hemodynamically significant svt.  Acute normocytic anemia Hb stableat 8.7 (which is her baseline during this hospitalization. Yesterday Hb was 10 that I think it was falsely elevated due to dehydration) -CBC daily  Opioid Use Disorder: Constipation and abdominal distention: Mild ileus  and moderate stool burden on previous abdominal Xray: Improved.  Now has diarrhea and cramp. May be due to withdarwal.  Off of Methadone 6/20 Still gets low dose of IV Dilaudid.  She continues to have generalized pain on her chest and back.   -Increase IV Dilaudid from 0.5 to 1 mg q4 h PRN -Continue with buspirone 15mg  bid -CtHydroxyzine 25 mg TID PRNfor anxiety -Continue PT and OT -Continuebentyl 20mg  bidPRN -Continue bowel regimen as needed  Vesicular rash improved Culture with WBC and rare gr positive cocci. Likley due to MRSA bacteremia. -Varicella PCR is pending -Continue to monitor, pain medications as above   Dispo: Anticipated discharge in approximately 5 weeks  Dewayne Hatch, MD 06/25/2019, 11:32 AM Pager: @[phone]

## 2019-06-25 NOTE — Progress Notes (Signed)
RT offered to place pt on CPAP dream station for the night and pt declined use for the night. RT expressed to pt that if she changed her mind to have RN call for placement. Pt respiratory status stable at this time. RT will continue to monitor.

## 2019-06-25 NOTE — Progress Notes (Signed)
[facility]  Date of Admission:  06/14/2019     Total days of antibiotics 12         ASSESSMENT/PLAN  Ms. Bogart has bilateral pulmonary septic emboli and suspected endocarditis complicated by MRSA bacteremia.  Course has been complicated by hypercapnia with now pleural effusions status post thoracentesis and improvement in symptoms.  Remains on nasal cannula at present and continues to receive vancomycin.  MRSA bacteremia - repeat blood cultures finalized without growth to date.  Okay for PICC line.  Likely source being disseminated MRSA infection.  Continue current dose of vancomycin.  Pleural effusion/loculation -thoracentesis performed yesterday with 350 cc of fluid obtained.  Cultures with no growth to date.  If anything were to grow would anticipate MRSA.  SPO2 stable remains on 2 L via nasal cannula.  Therapeutic Drug Monitoring - Renal function stable and no evidence of nephrotoxicity. Continue current AUC monitoring and dose adjustment per pharmacy protocol.    Principal Problem:   MRSA bacteremia Active Problems:   Opioid overdose ([place])   Septic pulmonary embolism, bilateral    Suspected endocarditis   Active intravenous drug use   Severe opioid dependence ([place])   Pericardial effusion   Rash/skin eruption   . busPIRone  10 mg Oral BID  . calcium carbonate  400 mg of elemental calcium Oral Once  . feeding supplement (ENSURE ENLIVE)  237 mL Oral TID BM  . heparin  5,000 Units Subcutaneous Q8H  . levothyroxine  150 mcg Oral Once per day on Sun Tue Wed Thu Fri Sat  . levothyroxine  225 mcg Oral Q Mon  . multivitamin with minerals  1 tablet Oral Daily  . mupirocin cream   Topical BID  . polyethylene glycol  17 g Oral Daily    SUBJECTIVE:  Afebrile overnight with stable leukocytosis. Thoracentesis performed with improvement in breathing. Feeling better today.   No Known Allergies   Review of Systems: Review of Systems   Constitutional: Negative for chills, fever and weight loss.  Respiratory: Negative for cough, shortness of breath and wheezing.   Cardiovascular: Negative for chest pain and leg swelling.  Gastrointestinal: Negative for abdominal pain, constipation, diarrhea, nausea and vomiting.  Skin: Negative for rash.      OBJECTIVE: Vitals:   06/25/19 1100 06/25/19 1130 06/25/19 1200 06/25/19 1230  BP:      Pulse: 95 (!) 105 (!) 102 96  Resp: (!) 22 (!) 25 (!) 27 (!) 25  Temp:      TempSrc:      SpO2: 99% 98% 98% 98%  Weight:      Height:       Body mass index is 22.74 kg/m.  Physical Exam Constitutional:      General: She is not in acute distress.    Appearance: She is well-developed.     Comments: Lying in bed with head of bed elevated; pleasant.   Cardiovascular:     Rate and Rhythm: Normal rate and regular rhythm.     Heart sounds: Normal heart sounds.  Pulmonary:     Effort: Pulmonary effort is normal.     Breath sounds: Normal breath sounds.  Skin:    General: Skin is warm and dry.  Neurological:     Mental Status: She is alert and oriented to person, place, and time.  Psychiatric:        Behavior: Behavior normal.        Thought Content: Thought content normal.  Judgment: Judgment normal.     Lab Results Lab Results  Component Value Date   WBC 15.6 (H) 06/25/2019   HGB 8.7 (L) 06/25/2019   HCT 27.9 (L) 06/25/2019   MCV 93.6 06/25/2019   PLT 381 06/25/2019    Lab Results  Component Value Date   CREATININE 0.73 06/25/2019   BUN 28 (H) 06/25/2019   NA 136 06/25/2019   K 3.7 06/25/2019   CL 101 06/25/2019   CO2 26 06/25/2019    Lab Results  Component Value Date   ALT 20 06/21/2019   AST 33 06/21/2019   ALKPHOS 87 06/21/2019   BILITOT 0.3 06/21/2019     Microbiology: Recent Results (from the past 240 hour(s))  Culture, blood (routine x 2)     Status: None   Collection Time: 06/15/19  4:00 PM   Specimen: BLOOD  Result Value Ref Range Status    Specimen Description BLOOD RIGHT ANTECUBITAL  Final   Special Requests   Final    BOTTLES DRAWN AEROBIC ONLY Blood Culture adequate volume   Culture   Final    NO GROWTH 5 DAYS Performed at [facility], [address], [place], [place] [address]    Report Status 06/20/2019 FINAL  Final  Culture, blood (routine x 2)     Status: None   Collection Time: 06/15/19  4:04 PM   Specimen: BLOOD LEFT HAND  Result Value Ref Range Status   Specimen Description BLOOD LEFT HAND  Final   Special Requests   Final    BOTTLES DRAWN AEROBIC ONLY Blood Culture adequate volume   Culture   Final    NO GROWTH 5 DAYS Performed at [facility], [place] [address] [place], [place] [address]    Report Status 06/20/2019 FINAL  Final  Aerobic Culture (superficial specimen)     Status: None   Collection Time: 06/16/19  2:20 PM   Specimen: Wound  Result Value Ref Range Status   Specimen Description WOUND CHEST  Final   Special Requests Normal  Final   Gram Stain   Final    RARE WBC PRESENT, PREDOMINANTLY MONONUCLEAR RARE GRAM POSITIVE COCCI Performed at [facility], [address], [place], [place] [address]    Culture FEW METHICILLIN RESISTANT STAPHYLOCOCCUS AUREUS  Final   Report Status 06/18/2019 FINAL  Final   Organism ID, Bacteria METHICILLIN RESISTANT STAPHYLOCOCCUS AUREUS  Final      Susceptibility   Methicillin resistant staphylococcus aureus - MIC*    CIPROFLOXACIN >=8 RESISTANT Resistant     ERYTHROMYCIN >=8 RESISTANT Resistant     GENTAMICIN <=0.5 SENSITIVE Sensitive     OXACILLIN >=4 RESISTANT Resistant     TETRACYCLINE <=1 SENSITIVE Sensitive     VANCOMYCIN 1 SENSITIVE Sensitive     TRIMETH/SULFA <=10 SENSITIVE Sensitive     CLINDAMYCIN <=0.25 SENSITIVE Sensitive     RIFAMPIN <=0.5 SENSITIVE Sensitive     Inducible Clindamycin NEGATIVE Sensitive     * FEW METHICILLIN RESISTANT STAPHYLOCOCCUS AUREUS  Culture, blood (routine x 2)     Status: Abnormal   Collection Time:  06/17/19 10:25 AM   Specimen: BLOOD LEFT HAND  Result Value Ref Range Status   Specimen Description BLOOD LEFT HAND  Final   Special Requests   Final    BOTTLES DRAWN AEROBIC ONLY Blood Culture adequate volume   Culture  Setup Time   Final    GRAM POSITIVE COCCI IN CLUSTERS AEROBIC BOTTLE ONLY CRITICAL  VALUE NOTED.  VALUE IS CONSISTENT WITH PREVIOUSLY REPORTED AND CALLED VALUE. Performed at [facility], [place] [address], [place], [place] [address]    Culture METHICILLIN RESISTANT STAPHYLOCOCCUS AUREUS (A)  Final   Report Status 06/20/2019 FINAL  Final   Organism ID, Bacteria METHICILLIN RESISTANT STAPHYLOCOCCUS AUREUS  Final      Susceptibility   Methicillin resistant staphylococcus aureus - MIC*    CIPROFLOXACIN >=8 RESISTANT Resistant     ERYTHROMYCIN >=8 RESISTANT Resistant     GENTAMICIN <=0.5 SENSITIVE Sensitive     OXACILLIN >=4 RESISTANT Resistant     TETRACYCLINE <=1 SENSITIVE Sensitive     VANCOMYCIN 1 SENSITIVE Sensitive     TRIMETH/SULFA <=10 SENSITIVE Sensitive     CLINDAMYCIN <=0.25 SENSITIVE Sensitive     RIFAMPIN <=0.5 SENSITIVE Sensitive     Inducible Clindamycin NEGATIVE Sensitive     * METHICILLIN RESISTANT STAPHYLOCOCCUS AUREUS  Culture, blood (routine x 2)     Status: Abnormal   Collection Time: 06/17/19 10:30 AM   Specimen: BLOOD RIGHT HAND  Result Value Ref Range Status   Specimen Description BLOOD RIGHT HAND  Final   Special Requests   Final    BOTTLES DRAWN AEROBIC ONLY Blood Culture adequate volume   Culture  Setup Time   Final    GRAM POSITIVE COCCI AEROBIC BOTTLE ONLY CRITICAL VALUE NOTED.  VALUE IS CONSISTENT WITH PREVIOUSLY REPORTED AND CALLED VALUE. Performed at [facility], [place] [address], [place], [place] [address]    Culture STAPHYLOCOCCUS AUREUS (A)  Final   Report Status 06/20/2019 FINAL  Final  Culture, blood (Routine X 2) w Reflex to ID Panel     Status: None   Collection Time: 06/20/19 12:41 AM   Specimen: BLOOD RIGHT HAND   Result Value Ref Range Status   Specimen Description BLOOD RIGHT HAND  Final   Special Requests   Final    BOTTLES DRAWN AEROBIC ONLY Blood Culture adequate volume   Culture   Final    NO GROWTH 5 DAYS Performed at [facility], [place] [address] [place], [place] [address]    Report Status 06/25/2019 FINAL  Final  Culture, blood (Routine X 2) w Reflex to ID Panel     Status: None   Collection Time: 06/20/19 12:51 AM   Specimen: BLOOD LEFT HAND  Result Value Ref Range Status   Specimen Description BLOOD LEFT HAND  Final   Special Requests   Final    BOTTLES DRAWN AEROBIC AND ANAEROBIC Blood Culture adequate volume   Culture   Final    NO GROWTH 5 DAYS Performed at [facility], [place] [address], [place], [place] [address]    Report Status 06/25/2019 FINAL  Final  MRSA PCR Screening     Status: None   Collection Time: 06/21/19  9:22 AM   Specimen: Nasal Mucosa; Nasopharyngeal  Result Value Ref Range Status   MRSA by PCR NEGATIVE NEGATIVE Final    Comment:        The GeneXpert MRSA Assay (FDA approved for NASAL specimens only), is one component of a comprehensive MRSA colonization surveillance program. It is not intended to diagnose MRSA infection nor to guide or monitor treatment for MRSA infections. Performed at [facility], [place] [address] [place],  [address]   SARS Coronavirus 2 (CEPHEID - Performed in [facility] hospital lab), Hosp Order     Status: None   Collection Time: 06/24/19 10:53  AM   Specimen: Nasopharyngeal Swab  Result Value Ref Range Status   SARS Coronavirus 2 NEGATIVE NEGATIVE Final    Comment: (NOTE) If result is NEGATIVE SARS-CoV-2 target nucleic acids are NOT DETECTED. The SARS-CoV-2 RNA is generally detectable in upper and lower  respiratory specimens during the acute phase of infection. The lowest  concentration of SARS-CoV-2 viral copies this assay can detect is 250  copies / mL. A negative result does not preclude  SARS-CoV-2 infection  and should not be used as the sole basis for treatment or other  patient management decisions.  A negative result may occur with  improper specimen collection / handling, submission of specimen other  than nasopharyngeal swab, presence of viral mutation(s) within the  areas targeted by this assay, and inadequate number of viral copies  (<250 copies / mL). A negative result must be combined with clinical  observations, patient history, and epidemiological information. If result is POSITIVE SARS-CoV-2 target nucleic acids are DETECTED. The SARS-CoV-2 RNA is generally detectable in upper and lower  respiratory specimens dur ing the acute phase of infection.  Positive  results are indicative of active infection with SARS-CoV-2.  Clinical  correlation with patient history and other diagnostic information is  necessary to determine patient infection status.  Positive results do  not rule out bacterial infection or co-infection with other viruses. If result is PRESUMPTIVE POSTIVE SARS-CoV-2 nucleic acids MAY BE PRESENT.   A presumptive positive result was obtained on the submitted specimen  and confirmed on repeat testing.  While 2019 novel coronavirus  (SARS-CoV-2) nucleic acids may be present in the submitted sample  additional confirmatory testing may be necessary for epidemiological  and / or clinical management purposes  to differentiate between  SARS-CoV-2 and other Sarbecovirus currently known to infect humans.  If clinically indicated additional testing with an alternate test  methodology [phone]) is advised. The SARS-CoV-2 RNA is generally  detectable in upper and lower respiratory sp ecimens during the acute  phase of infection. The expected result is Negative. Fact Sheet for Patients:  StrictlyIdeas.no Fact Sheet for Healthcare Providers: BankingDealers.co.za This test is not yet approved or cleared by the  [place] FDA and has been authorized for detection and/or diagnosis of SARS-CoV-2 by FDA under an Emergency Use Authorization (EUA).  This EUA will remain in effect (meaning this test can be used) for the duration of the COVID-19 declaration under Section 564(b)(1) of the Act, 21 U.S.C. section 360bbb-3(b)(1), unless the authorization is terminated or revoked sooner. Performed at [facility], [place] [address], [place], [place] [address]   Gram stain     Status: None   Collection Time: 06/24/19  2:32 PM   Specimen: Pleural, Left; Pleural Fluid  Result Value Ref Range Status   Specimen Description PLEURAL LEFT  Final   Special Requests NONE  Final   Gram Stain   Final    MODERATE WBC PRESENT, PREDOMINANTLY PMN NO ORGANISMS SEEN Performed at [facility], [place] [address], [place], [place] [address]    Report Status 06/24/2019 FINAL  Final  Culture, body fluid-bottle     Status: None (Preliminary result)   Collection Time: 06/24/19  2:32 PM   Specimen: Pleura  Result Value Ref Range Status   Specimen Description PLEURAL LEFT  Final   Special Requests NONE  Final   Culture   Final    NO GROWTH < 24 HOURS Performed at [facility], [place] [address],  [place], [place] [address]    Report Status PENDING  Incomplete     Terri Piedra, [place] [facility] [phone] Pager  06/25/2019  1:14 PM

## 2019-06-26 LAB — BASIC METABOLIC PANEL
Anion gap: 11 (ref 5–15)
BUN: 20 mg/dL (ref 6–20)
CO2: 27 mmol/L (ref 22–32)
Calcium: 7.7 mg/dL — ABNORMAL LOW (ref 8.9–10.3)
Chloride: 99 mmol/L (ref 98–111)
Creatinine, Ser: 0.74 mg/dL (ref 0.44–1.00)
GFR calc Af Amer: >60 mL/min (ref >60)
GFR calc non Af Amer: >60 mL/min (ref >60)
Glucose, Bld: 85 mg/dL (ref 70–99)
Potassium: 3.6 mmol/L (ref 3.5–5.1)
Sodium: 137 mmol/L (ref 135–145)

## 2019-06-26 LAB — CBC
HCT: 24.2 % — ABNORMAL LOW (ref 36.0–46.0)
Hemoglobin: 7.7 g/dL — ABNORMAL LOW (ref 12.0–15.0)
MCH: 29.6 pg (ref 26.0–34.0)
MCHC: 31.8 g/dL (ref 30.0–36.0)
MCV: 93.1 fL (ref 80.0–100.0)
Platelets: 453 10*3/uL — ABNORMAL HIGH (ref 150–400)
RBC: 2.6 MIL/uL — ABNORMAL LOW (ref 3.87–5.11)
RDW: 18.5 % — ABNORMAL HIGH (ref 11.5–15.5)
WBC: 16.7 10*3/uL — ABNORMAL HIGH (ref 4.0–10.5)
nRBC: 0.2 % (ref 0.0–0.2)

## 2019-06-26 LAB — VANCOMYCIN, RANDOM: Vancomycin Rm: 10

## 2019-06-26 LAB — PATHOLOGIST SMEAR REVIEW

## 2019-06-26 MED ORDER — SODIUM CHLORIDE 0.9% FLUSH
10.0000 mL | INTRAVENOUS | Status: DC | PRN
  Administered 2019-07-18: 10 mL
  Administered 2019-07-22: 20 mL
  Administered 2019-07-24: 30 mL
  Filled 2019-06-26 (×3): qty 40

## 2019-06-26 MED ORDER — SODIUM CHLORIDE 0.9% FLUSH
10.0000 mL | Freq: Two times a day (BID) | INTRAVENOUS | Status: DC
  Administered 2019-06-26 – 2019-06-29 (×7): 10 mL
  Administered 2019-06-30: 23:00:00 40 mL
  Administered 2019-06-30: 10 mL
  Administered 2019-07-01: 20 mL
  Administered 2019-07-01 – 2019-07-23 (×41): 10 mL
  Administered 2019-07-23: 30 mL
  Administered 2019-07-23: 20 mL
  Administered 2019-07-24 – 2019-07-27 (×7): 10 mL
  Administered 2019-07-27 – 2019-07-28 (×2): 20 mL
  Administered 2019-07-29 – 2019-07-31 (×5): 10 mL

## 2019-06-26 NOTE — Progress Notes (Signed)
Pharmacy Antibiotic Note  Monica Stephens is a 38 y.o. female admitted on 06/14/2019 with MRSA bacteremia. Pharmacy has been consulted for vancomycin dosing.    Scr back to baseline at 0.74. WBC 16.7, afebrile. Currently therapeutic with calculated AUC 458.  Plan: Continue Vancomycin 1 grams iv Q 24, goal AUC 400-550 >>estimated AUC 458 Monitor renal functions and levels as indicated   Height: 5\' 2"  (157.5 cm) Weight: 121 lb 0.5 oz (54.9 kg) IBW/kg (Calculated) : 50.1  Temp (24hrs), Avg:98.5 F (36.9 C), Min:98.1 F (36.7 C), Max:99.4 F (37.4 C)  Recent Labs  Lab 06/19/19 1328  06/19/19 2227  06/21/19 0401 06/22/19 0434 06/22/19 0634 06/23/19 0524 06/24/19 0459 06/25/19 0317 06/25/19 1600 06/26/19 0541  WBC  --   --   --    < > 9.3  --  11.1* 13.7* 15.7* 15.6*  --  16.7*  CREATININE  --    < >  --    < > 0.76 0.74  --  0.63 0.65 0.73  --  0.74  LATICACIDVEN  --   --   --   --  1.1  --   --   --   --   --   --   --   VANCOTROUGH  --   --  13*  --   --   --   --   --   --   --   --   --   VANCOPEAK 26*  --   --   --   --   --   --   --   --   --   --   --   VANCORANDOM  --   --   --   --   --   --   --   --   --   --  27 10   < > = values in this interval not displayed.    Estimated Creatinine Clearance: 76.9 mL/min (by C-G formula based on SCr of 0.74 mg/dL).    No Known Allergies  Antimicrobials this admission  6/14 Vanc>> (minimum 6 weeks per ID ) 6/14 Cefepime X 1 6/14 Acyclovir X 1  Dosing changes this admission 6/19 VP/VT 26/13, AUC = 457 on 500mg  q12 >> 1g Q24 for convenience outpt 6/26 VP/VT 27/10, AUC = 458   Microbiology results 6/14 Bcx x 4/4: staph aureus (BCID MRSA) 6/14 COVID: neg  6/14 Ucx: MRSA 6/16 Wound: MRSA 6/17 Bcx: MRSA 6/20 Bcx: NGF 6/24 Pleual fluid: NGF 6/24 COVID: neg   Azzie Roup D PGY1 Pharmacy Resident  Phone [phone] Please use AMION for clinical pharmacists numbers  06/26/2019      9:10 AM

## 2019-06-26 NOTE — Progress Notes (Signed)
   06/26/19 1100  OT Visit Information  Last OT Received On 06/26/19  Reason Eval/Treat Not Completed Patient declined, no reason specified  Nestor Lewandowsky, OTR/L [place] Pager: [phone] Office: [phone]

## 2019-06-26 NOTE — Progress Notes (Signed)
Subjective:  Monica Stephens was seen resting in her bed this morning. She stated "she is having a panic attack right now, but will be alright". States that her breathing is improved. Feels that the pain was initially doing better but then when she moved to use the restroom this morning the pain got worse.  Her abdominal pain is better, she had a normal bowel movement yesterday.  No shortness of breath.  Objective:  Vital signs in last 24 hours: Vitals:   06/25/19 2038 06/25/19 2300 06/26/19 0542 06/26/19 0558  BP: 113/77 110/70 120/79   Pulse: (!) 121 (!) 108 99   Resp: (!) 33 (!) 29 19 (!) 30  Temp: 99.4 F (37.4 C) 98.3 F (36.8 C) 98.3 F (36.8 C)   TempSrc: Oral Oral Oral   SpO2: 91% 98% 100%   Weight:    54.9 kg  Height:       Physical exam: General: Lying in the bed, no acute distress, mildly anxious CV: RRR, normal S1-S2, 1+ bilateral lower extremity edema and upper extremity edema (improved today) Pulmonary exam: Normal work of breathing, CTA bilaterally Abdomen: Soft, nontender to palpation, mildly distended  Assessment/Plan:  Principal Problem:   MRSA bacteremia Active Problems:   Opioid overdose (HCC)   Septic pulmonary embolism, bilateral    Suspected endocarditis   Active intravenous drug use   Severe opioid dependence (HCC)   Pericardial effusion   Rash/skin eruption  Monica Stephens is a 38 year old female withpolysubstance abuse including IVheroin, bipolar, history of thyroid cancer who presents with chest pain and dyspneaand found to have bilateral septic pulmonary emboliwith severe pulmonary infection and MRSA bacteremia. Complicated with acute hypercapnic respiratory failure, and transferred to ICU and got Bi PAP came back to medical floorafter improvement.  MRSA bacteremiaw/ septicemboli to lungs, paraspinal muscles &ileum.  Probably hx of rightsided endocarditiswith no acute vegetations on TEE.  BC 6/14MRSA positive BC 6/15 no growthso far  [facility] 6/17MRSA growing in 4/4 bottles  BC 6/20negative x 1 Clinically improving.  No fever.  However she has had worsening of leukocytosis, today at 17. concerning for worsening of empyema, however she is clinically doing well.  Will monitor, may worsen, will consider another imaging for evaluation of empyema recollection. -CBC daily -Continue vancomycin (QBV69) -Continue pain management -Continue Ensure -ID is following, appreciate recommendation -Insert PICC line today   Right-sided pleural effusion concerning for early empyema Labs suggestive of exudative pleural effusion. Likely due to infection. Culture and cytology ending.  -F/u pending lab and cytology -May consider reimaging in few days if clinically concerning.  Right upper extremity edema:   Improved. Likely due to IV infiltration also in setting of poor maturation and low albumin. -May remove right upper extremity IV if can successfully insert PICCline today -Right upper extremity elevation -Encourage drink Ensure  Intermittent SVT with bradycardia: No further episode. TEE without acute vegetations. Likely driven by underlying multifocal pneumonia. Dr. Debara Pickett has evaluated patient and recommended prn iv metoprolol if sustained and hemodynamically significant svt.  Acute normocytic anemia Hb 7.7   -CBC daily  Opioid Use Disorder: Constipation and abdominal distention: Mild ileus and moderate stool burden on previous abdominal Xray:Improved.  Now has diarrhea and cramp. May be due to withdarwal.  Off ofMethadone6/20 due to respiratory failure. Still gets low dose of IV Dilaudid.She continues to have generalized pain on her chest and back.  -continueIV Dilaudid from1 mg q4h PRN -Continue with buspirone 15mg  bid -CtHydroxyzine 25 mg TID PRNfor anxiety -ContinuePT  and OT -Continuebentyl 20mg  bidPRN -Continue bowel regimen as needed -May consider resuming methadone next week  Vesicular  rash improved Culture with WBC and rare gr positive cocci. Likley due to MRSA bacteremia. -Varicella PCR is pending -Continue to monitor, pain medications as above  Dispo: Anticipated discharge in approximately 5 weeks  Lars Mage, MD 06/26/2019, 8:24 AM Pager: Pager: [phone]

## 2019-06-26 NOTE — Progress Notes (Signed)
Peripherally Inserted Central Catheter/Midline Placement  The IV Nurse has discussed with the patient and/or persons authorized to consent for the patient, the purpose of this procedure and the potential benefits and risks involved with this procedure.  The benefits include less needle sticks, lab draws from the catheter, and the patient may be discharged home with the catheter. Risks include, but not limited to, infection, bleeding, blood clot (thrombus formation), and puncture of an artery; nerve damage and irregular heartbeat and possibility to perform a PICC exchange if needed/ordered by physician.  Alternatives to this procedure were also discussed.  B[employer] PICC patient education guide, fact sheet on infection prevention and patient information card has been provided to patient /or left at bedside.    PICC/Midline Placement Documentation        Darlyn Read 06/26/2019, 3:55 PM

## 2019-06-26 NOTE — Progress Notes (Signed)
RT discontinued pt order for CPAP pt has refused for multiple nights and states she does not plan to wear it while she is here. RT removed equipment from pt room. Pt respiratory status stable at this time. RT will continue to monitor.

## 2019-06-27 ENCOUNTER — Inpatient Hospital Stay (HOSPITAL_COMMUNITY): Payer: Self-pay

## 2019-06-27 LAB — BASIC METABOLIC PANEL
Anion gap: 9 (ref 5–15)
BUN: 17 mg/dL (ref 6–20)
CO2: 28 mmol/L (ref 22–32)
Calcium: 7.5 mg/dL — ABNORMAL LOW (ref 8.9–10.3)
Chloride: 97 mmol/L — ABNORMAL LOW (ref 98–111)
Creatinine, Ser: 0.7 mg/dL (ref 0.44–1.00)
GFR calc Af Amer: >60 mL/min (ref >60)
GFR calc non Af Amer: >60 mL/min (ref >60)
Glucose, Bld: 82 mg/dL (ref 70–99)
Potassium: 3.7 mmol/L (ref 3.5–5.1)
Sodium: 134 mmol/L — ABNORMAL LOW (ref 135–145)

## 2019-06-27 LAB — CBC
HCT: 22.7 % — ABNORMAL LOW (ref 36.0–46.0)
Hemoglobin: 7.3 g/dL — ABNORMAL LOW (ref 12.0–15.0)
MCH: 30.3 pg (ref 26.0–34.0)
MCHC: 32.2 g/dL (ref 30.0–36.0)
MCV: 94.2 fL (ref 80.0–100.0)
Platelets: 455 10*3/uL — ABNORMAL HIGH (ref 150–400)
RBC: 2.41 MIL/uL — ABNORMAL LOW (ref 3.87–5.11)
RDW: 18.3 % — ABNORMAL HIGH (ref 11.5–15.5)
WBC: 17.1 10*3/uL — ABNORMAL HIGH (ref 4.0–10.5)
nRBC: 0.1 % (ref 0.0–0.2)

## 2019-06-27 IMAGING — DX PORTABLE CHEST - 1 VIEW
1 series · 1 of 1 positions shown · non-contrast
Comparison: [DATE]

CLINICAL DATA: Chest pain

EXAM:
PORTABLE CHEST 1 VIEW

[chest ap]
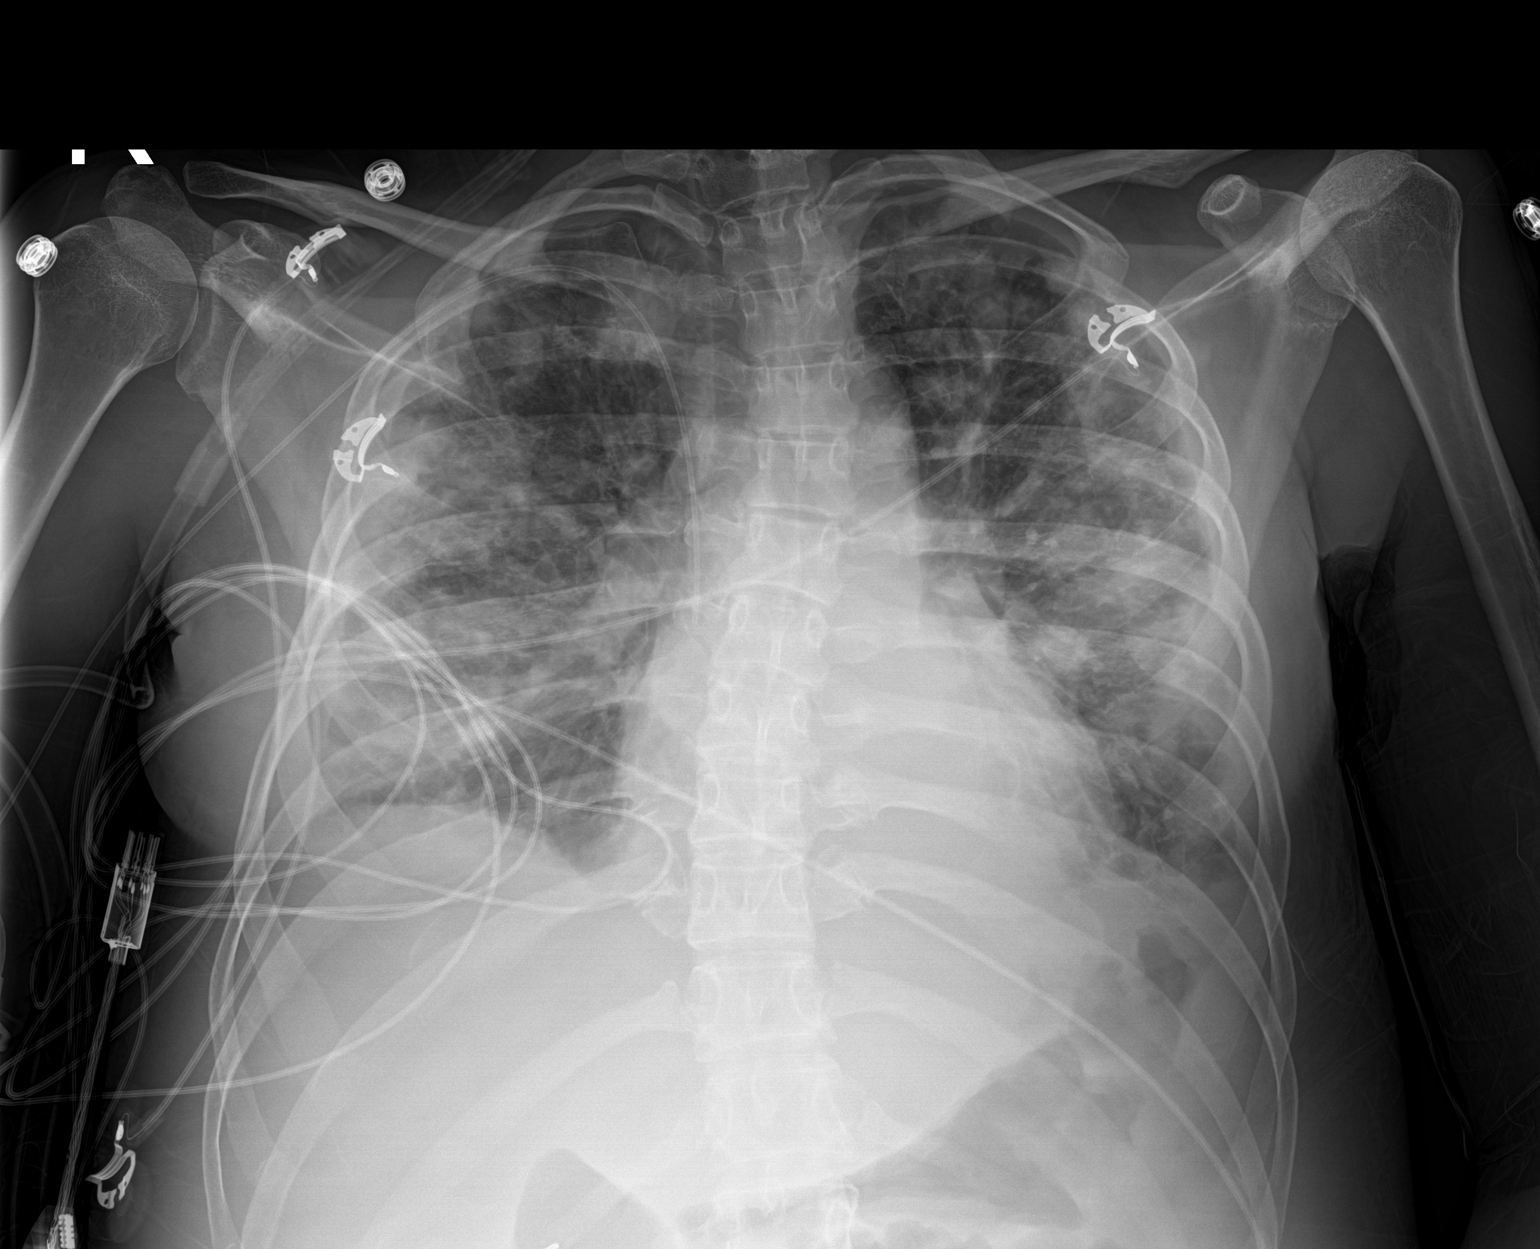

[1 of 1 positions shown; findings below may reference images not displayed]

FINDINGS: The right-sided PICC line is well position. Again noted are diffuse
bilateral multifocal airspace opacities consistent with multifocal
pneumonia. There are small bilateral pleural effusions. There is no
pneumothorax. No acute osseous abnormality.
IMPRESSION: 1. Well-positioned right-sided PICC line.
2. Multifocal airspace opacities are again noted, not significantly
improved from prior study.
3. Small bilateral pleural effusions.

## 2019-06-27 MED ORDER — KETOROLAC TROMETHAMINE 30 MG/ML IJ SOLN
30.0000 mg | Freq: Once | INTRAMUSCULAR | Status: AC
  Administered 2019-06-27: 23:00:00 30 mg via INTRAVENOUS
  Filled 2019-06-27: qty 1

## 2019-06-27 MED ORDER — HYDROMORPHONE HCL 1 MG/ML IJ SOLN
1.0000 mg | Freq: Once | INTRAMUSCULAR | Status: AC
  Administered 2019-06-27: 22:00:00 1 mg via INTRAVENOUS
  Filled 2019-06-27: qty 1

## 2019-06-27 NOTE — Progress Notes (Signed)
   Subjective:  Monica Stephens was seen laying in her bed. She stated that her breathing was well. She last had a bowel movement yesterday morning. She stated that her abdominal pain is better now that she got her pain medication.   Objective:  Vital signs in last 24 hours: Vitals:   06/27/19 0041 06/27/19 0400 06/27/19 0500 06/27/19 0838  BP: 116/74 116/71  117/76  Pulse: 99 98 95 (!) 102  Resp: (!) 28 (!) 25 (!) 23 20  Temp: 98.2 F (36.8 C) 98.1 F (36.7 C)  98 F (36.7 C)  TempSrc: Oral Oral  Oral  SpO2: 99% 99% 99% 98%  Weight:   54.8 kg   Height:       Ph/E: General: No acute distress CV: RRR, normal S1-S2, 1+ bilateral upper and lower extremity edema.(Right upper extremity edema improved today) Pulm/chest: CTA bilaterally, no crackle Abdomen: Soft, not tender to palpation, BS present  Assessment/Plan:  Principal Problem:   MRSA bacteremia Active Problems:   Opioid overdose (HCC)   Septic pulmonary embolism, bilateral    Suspected endocarditis   Active intravenous drug use   Severe opioid dependence (HCC)   Pericardial effusion   Rash/skin eruption  Monica Stephens is a 38 year old female withpolysubstance abuse including IVheroin, bipolar, history of thyroid cancer who presents with chest pain and dyspneaand found to have bilateral septic pulmonary emboliwith severe pulmonary infection and MRSA bacteremia. Complicated with acute hypercapnic respiratory failure, and transferred to ICU and got Bi PAP came back to medical floorafter improvement.   MRSA bacteremiaw/ septicemboli to lungs, paraspinal muscles &ileum.  Probably hx of rightsided endocarditiswith no acute vegetations on TEE.  BC 6/14MRSA positive BC 6/15 no growthso far [facility] 6/17MRSA growing in 4/4 bottles  BC 6/20negative x 1  Clinically improving. No fever. Still has leukocytosis, today at 17. Will monitor. Midline placed yesterday.  -CBC daily -Continue vancomycin ([id number]) -Continue pain  management -Continue Ensure -ID is following, appreciate recommendation  Parapneumonic pleural effusion Labs suggestive of exudative pleural effusion. Likely due to infection. Culture and cytology ending.  -F/u pending lab and cytology -May consider reimaging in few days if clinically concerning.   Opioid Use Disorder: Constipation and abdominal distention: Mild ileus and moderate stool burden onpreviousabdominal Xray:Resolved  Off ofMethadone6/20 due to respiratory failure. Still gets low dose of IV Dilaudid.-continueIV Dilaudidfrom1 mgq4h PRN -Continue with buspirone 15mg  bid -CtHydroxyzine 25 mg TID PRNfor anxiety -ContinuePT and OT -Continuebentyl 20mg  bidPRN -Continue bowel regimen as needed -May consider resuming methadone next week if pain med requirements decreased  Dispo: Anticipated discharge in approximately 5 weeks   Lars Mage, MD 06/27/2019, 9:23 AM Pager: Pager: [phone]

## 2019-06-28 DIAGNOSIS — R600 Localized edema: Secondary | ICD-10-CM

## 2019-06-28 LAB — CBC
HCT: 23.3 % — ABNORMAL LOW (ref 36.0–46.0)
Hemoglobin: 7.3 g/dL — ABNORMAL LOW (ref 12.0–15.0)
MCH: 29.6 pg (ref 26.0–34.0)
MCHC: 31.3 g/dL (ref 30.0–36.0)
MCV: 94.3 fL (ref 80.0–100.0)
Platelets: 473 10*3/uL — ABNORMAL HIGH (ref 150–400)
RBC: 2.47 MIL/uL — ABNORMAL LOW (ref 3.87–5.11)
RDW: 18.6 % — ABNORMAL HIGH (ref 11.5–15.5)
WBC: 19.3 10*3/uL — ABNORMAL HIGH (ref 4.0–10.5)
nRBC: 0.1 % (ref 0.0–0.2)

## 2019-06-28 LAB — BASIC METABOLIC PANEL
Anion gap: 7 (ref 5–15)
BUN: 15 mg/dL (ref 6–20)
CO2: 27 mmol/L (ref 22–32)
Calcium: 7.6 mg/dL — ABNORMAL LOW (ref 8.9–10.3)
Chloride: 99 mmol/L (ref 98–111)
Creatinine, Ser: 0.64 mg/dL (ref 0.44–1.00)
GFR calc Af Amer: >60 mL/min (ref >60)
GFR calc non Af Amer: >60 mL/min (ref >60)
Glucose, Bld: 91 mg/dL (ref 70–99)
Potassium: 3.7 mmol/L (ref 3.5–5.1)
Sodium: 133 mmol/L — ABNORMAL LOW (ref 135–145)

## 2019-06-28 LAB — CHOLESTEROL, BODY FLUID: Cholesterol, Fluid: 22 mg/dL

## 2019-06-28 NOTE — Progress Notes (Signed)
   Subjective:   Monica Stephens was seen resting in her bed this morning. She stated that she had some difficulty breathing last night, but feels better this morning. A chest xray was done overnight which showed stable small bilateral pleural effusions and multifocal airspace opacities.   She states that she continues to have pain and feels that she needs the pain medication more frequently.   Objective:  Vital signs in last 24 hours: Vitals:   06/27/19 1930 06/27/19 2000 06/27/19 2318 06/28/19 0305  BP: 126/80  123/79 118/75  Pulse: (!) 113 (!) 107 100 97  Resp: (!) 33 (!) 26 (!) 31 (!) 29  Temp: 99 F (37.2 C)  98.2 F (36.8 C) (!) 97.5 F (36.4 C)  TempSrc: Oral  Oral Oral  SpO2: 96% 97% 98% 97%  Weight:    53.8 kg  Height:       Physical Exam  Constitutional: She is oriented to person, place, and time. She appears well-developed and well-nourished. No distress.  HENT:  Head: Normocephalic and atraumatic.  Eyes: Conjunctivae are normal.  Cardiovascular: Normal rate, regular rhythm and normal heart sounds.  Respiratory: Effort normal and breath sounds normal. No respiratory distress. She has no wheezes.  GI: Soft. Bowel sounds are normal. She exhibits no distension. There is no abdominal tenderness.  Musculoskeletal:        General: Edema (in bilateral upper extremities R>L, mild amount in bilateral lower extremities) present.  Neurological: She is alert and oriented to person, place, and time.  Skin: She is not diaphoretic. There is erythema (over precordial area and upper abdomen).  Psychiatric: Her behavior is normal. Her affect is labile. Her speech is delayed. She exhibits a depressed mood.   Assessment/Plan:  Principal Problem:   MRSA bacteremia Active Problems:   Opioid overdose ([place])   Septic pulmonary embolism, bilateral    Suspected endocarditis   Active intravenous drug use   Severe opioid dependence (HCC)   Pericardial effusion   Rash/skin eruption  MRSA  bacteremiaw/ septicemboli to lungs, paraspinal muscles &ileum.  Probably hx of rightsided endocarditiswith no acute vegetations on TEE.  BC 6/14MRSA positive BC 6/15 no growthso far [facility] 6/17MRSA growing in 4/4 bottles  BC 6/20negative x 1  Sites of infection: pulmonary emboli, suspected endocarditis but echo without vegetations.  No septic infection, intracranial emboli Midline placed 6/26  Patient had a slightly increased leukocytosis from 17 to 19, no fevers. Will continue to monitor.   -CBC daily -Continue vancomycin ([id number]) -Continue pain management -Continue Ensure -ID is following, appreciate recommendation  Parapneumonic pleural effusion Per thoracocentesis done 06/24/19. Thought to be secondary to MRSA infection.  If patient continues to have worsening dyspnea we may consider re-imaging the patient.   Chest xray done 06/27/19 showed multifocal airspace opacities that have not improved from prior. Small bilateral pleural effusions.   Right upper extremity edema Called IV team to evaluate arm for possible line infiltration. They felt that no changes needed to be made.   -If edema worsened will get upper extremity doppler   Opioid Use Disorder: Mild ileus and moderate stool burden onpreviousabdominal Xray  -Off ofMethadone6/67may consider starting suboxone once patient is more stable  -continueIV Dilaudidfrom1 mgq4h PRN -Continue with buspirone 15mg  bid -Continue tHydroxyzine 25 mg TID PRNfor anxiety -ContinuePT and OT -Continuebentyl 20mg  bidPRN -Continue bowel regimen as needed  Dispo: Anticipated discharge when stable  Lars Mage, MD 06/28/2019, 9:16 AM Pager: [phone]

## 2019-06-28 NOTE — Progress Notes (Signed)
Called to assess Right Picc due to arm edema. Both arms edematous. Right arm elevated on pillow. R picc has good blood return, site WNL, Right arm with edema below Picc site to Right mid- forearm. No edema of hand. Good radial pulse, skin warm and dry. Recommend Doppler study of Right arm to further assess

## 2019-06-29 ENCOUNTER — Inpatient Hospital Stay (HOSPITAL_COMMUNITY): Payer: Self-pay

## 2019-06-29 DIAGNOSIS — R609 Edema, unspecified: Secondary | ICD-10-CM

## 2019-06-29 DIAGNOSIS — Z95828 Presence of other vascular implants and grafts: Secondary | ICD-10-CM

## 2019-06-29 DIAGNOSIS — R601 Generalized edema: Secondary | ICD-10-CM

## 2019-06-29 DIAGNOSIS — D72829 Elevated white blood cell count, unspecified: Secondary | ICD-10-CM

## 2019-06-29 LAB — BASIC METABOLIC PANEL
Anion gap: 6 (ref 5–15)
BUN: 12 mg/dL (ref 6–20)
CO2: 27 mmol/L (ref 22–32)
Calcium: 7.5 mg/dL — ABNORMAL LOW (ref 8.9–10.3)
Chloride: 102 mmol/L (ref 98–111)
Creatinine, Ser: 0.62 mg/dL (ref 0.44–1.00)
GFR calc Af Amer: >60 mL/min (ref >60)
GFR calc non Af Amer: >60 mL/min (ref >60)
Glucose, Bld: 89 mg/dL (ref 70–99)
Potassium: 3.8 mmol/L (ref 3.5–5.1)
Sodium: 135 mmol/L (ref 135–145)

## 2019-06-29 LAB — CULTURE, BODY FLUID W GRAM STAIN -BOTTLE: Culture: NO GROWTH

## 2019-06-29 LAB — CBC
HCT: 22.1 % — ABNORMAL LOW (ref 36.0–46.0)
Hemoglobin: 7 g/dL — ABNORMAL LOW (ref 12.0–15.0)
MCH: 30 pg (ref 26.0–34.0)
MCHC: 31.7 g/dL (ref 30.0–36.0)
MCV: 94.8 fL (ref 80.0–100.0)
Platelets: 516 10*3/uL — ABNORMAL HIGH (ref 150–400)
RBC: 2.33 MIL/uL — ABNORMAL LOW (ref 3.87–5.11)
RDW: 18.8 % — ABNORMAL HIGH (ref 11.5–15.5)
WBC: 17.8 10*3/uL — ABNORMAL HIGH (ref 4.0–10.5)
nRBC: 0 % (ref 0.0–0.2)

## 2019-06-29 NOTE — Progress Notes (Signed)
VASCULAR LAB PRELIMINARY  PRELIMINARY  PRELIMINARY  PRELIMINARY  Bilateral upper extremity venous duplex completed.    Preliminary report:  See CV proc for preliminary results.   Damarien Nyman, RVT 06/29/2019, 3:28 PM

## 2019-06-29 NOTE — Progress Notes (Signed)
Pharmacy Antibiotic Note  Monica Stephens is a 38 y.o. female  with MRSA bacteremia and presumed right sided endocarditis. Pharmacy has been consulted for vancomycin dosing.  Plans noted for six week of therapy.   -WBC 17.8, Tm 100, SCr= 0.62   Plan: Continue Vancomycin 1 grams iv Q 24, goal AUC 400-550 Will plan for vancomycin levels on 7/3 Will follow renal function and clinical progress   Height: 5\' 2"  (157.5 cm) Weight: 116 lb 6.5 oz (52.8 kg) IBW/kg (Calculated) : 50.1  Temp (24hrs), Avg:98.7 F (37.1 C), Min:97.9 F (36.6 C), Max:99.5 F (37.5 C)  Recent Labs  Lab 06/25/19 0317 06/25/19 1600 06/26/19 0541 06/27/19 0632 06/28/19 0345 06/29/19 0507  WBC 15.6*  --  16.7* 17.1* 19.3* 17.8*  CREATININE 0.73  --  0.74 0.70 0.64 0.62  VANCORANDOM  --  27 10  --   --   --     Estimated Creatinine Clearance: 76.9 mL/min (by C-G formula based on SCr of 0.62 mg/dL).    No Known Allergies  Antimicrobials this admission  6/14 Vanc>> (minimum 6 weeks per ID ) 6/14 Cefepime X 1 6/14 Acyclovir X 1  Dosing changes this admission 6/19 VP/VT 26/13, AUC = 457 on 500mg  q12 >> 1g Q24 for convenience outpt 6/26 VP/VT 27/10, AUC = 458   Microbiology results 6/14 Bcx x 4/4: staph aureus (BCID MRSA) 6/14 COVID: neg  6/14 Ucx: MRSA 6/16 Wound: MRSA 6/17 Bcx: MRSA 6/20 Bcx: NGF 6/24 Pleual fluid: NGF 6/24 COVID: neg   Hildred Laser, PharmD Clinical Pharmacist **Pharmacist phone directory can now be found on [email or web address] ([email or web address]).  Listed under [place].

## 2019-06-29 NOTE — Progress Notes (Signed)
[facility]  Date of Admission:  06/14/2019     Total days of antibiotics 16         ASSESSMENT/PLAN  Monica Stephens has bilateral pulmonary septic emboli and suspected endocarditis complicated by MRSA bacteremia.  Course complicated with development of hypercapnia and pleural effusions status post thoracentesis.  Mildly elevated temperatures over the last 24 to 48 hours.  Primary team evaluating for possibility of DVT.  MRSA bacteremia -repeat cultures finalized without growth.  We will repeat blood cultures today given chest pain and mildly increased temperature with leukocytosis.  Continue current dose of vancomycin.  Leukocytosis -likely from disseminated MRSA infection.  Obtain blood cultures.  Consider additional imaging if fevers develop or bacteremia is found.  Therapeutic drug monitoring -renal function stable with no evidence of nephrotoxicity.  Continue current dose of vancomycin per pharmacy protocol.  Principal Problem:   MRSA bacteremia Active Problems:   Opioid overdose ([place])   Septic pulmonary embolism, bilateral    Suspected endocarditis   Active intravenous drug use   Severe opioid dependence ([place])   Pericardial effusion   Rash/skin eruption   . busPIRone  10 mg Oral BID  . feeding supplement (ENSURE ENLIVE)  237 mL Oral TID BM  . heparin  5,000 Units Subcutaneous Q8H  . levothyroxine  150 mcg Oral Once per day on Sun Tue Wed Thu Fri Sat  . levothyroxine  225 mcg Oral Q Mon  . multivitamin with minerals  1 tablet Oral Daily  . polyethylene glycol  17 g Oral Daily  . sodium chloride flush  10-40 mL Intracatheter Q12H    SUBJECTIVE:  Mildly elevated temperatures over the weekend up to 100. Has developed bilateral upper extremity edema. WBC count remains elevated. Seated on the side of the bed and working with physical therapy having increased chest pain in the seated position.   No Known Allergies   Review of Systems: Review of  Systems  Constitutional: Negative for chills, fever and weight loss.  Respiratory: Negative for cough, shortness of breath and wheezing.   Cardiovascular: Positive for chest pain. Negative for leg swelling.  Gastrointestinal: Negative for abdominal pain, constipation, diarrhea, nausea and vomiting.  Skin: Negative for rash.      OBJECTIVE: Vitals:   06/29/19 0440 06/29/19 0834 06/29/19 1023 06/29/19 1210  BP:  110/76  110/74  Pulse:  (!) 105  (!) 112  Resp: 20 (!) 22 (!) 28 (!) 21  Temp:  97.9 F (36.6 C)  97.7 F (36.5 C)  TempSrc:  Oral  Oral  SpO2:  98%  100%  Weight: 52.8 kg     Height:       Body mass index is 21.29 kg/m.  Physical Exam Constitutional:      General: Monica Stephens is not in acute distress.    Appearance: Monica Stephens is well-developed. Monica Stephens is ill-appearing.     Comments: Seated on the side of the bed  Cardiovascular:     Rate and Rhythm: Normal rate and regular rhythm.     Heart sounds: Normal heart sounds.     Comments: Bilateral upper extremity swelling; PICC line dressing is clean and dry and site appears free from infection Pulmonary:     Effort: Pulmonary effort is normal.     Breath sounds: Normal breath sounds. No rhonchi or rales.  Skin:    General: Skin is warm and dry.  Neurological:     Mental Status: Monica Stephens is alert and oriented to  person, place, and time.  Psychiatric:        Mood and Affect: Mood normal.     Lab Results Lab Results  Component Value Date   WBC 17.8 (H) 06/29/2019   HGB 7.0 (L) 06/29/2019   HCT 22.1 (L) 06/29/2019   MCV 94.8 06/29/2019   PLT 516 (H) 06/29/2019    Lab Results  Component Value Date   CREATININE 0.62 06/29/2019   BUN 12 06/29/2019   NA 135 06/29/2019   K 3.8 06/29/2019   CL 102 06/29/2019   CO2 27 06/29/2019    Lab Results  Component Value Date   ALT 20 06/21/2019   AST 33 06/21/2019   ALKPHOS 87 06/21/2019   BILITOT 0.3 06/21/2019     Microbiology: Recent Results (from the past 240 hour(s))   Culture, blood (Routine X 2) w Reflex to ID Panel     Status: None   Collection Time: 06/20/19 12:41 AM   Specimen: BLOOD RIGHT HAND  Result Value Ref Range Status   Specimen Description BLOOD RIGHT HAND  Final   Special Requests   Final    BOTTLES DRAWN AEROBIC ONLY Blood Culture adequate volume   Culture   Final    NO GROWTH 5 DAYS Performed at [facility], [address] [place], [place] [address]    Report Status 06/25/2019 FINAL  Final  Culture, blood (Routine X 2) w Reflex to ID Panel     Status: None   Collection Time: 06/20/19 12:51 AM   Specimen: BLOOD LEFT HAND  Result Value Ref Range Status   Specimen Description BLOOD LEFT HAND  Final   Special Requests   Final    BOTTLES DRAWN AEROBIC AND ANAEROBIC Blood Culture adequate volume   Culture   Final    NO GROWTH 5 DAYS Performed at [facility], [place] [address] [place], [place] [address]    Report Status 06/25/2019 FINAL  Final  MRSA PCR Screening     Status: None   Collection Time: 06/21/19  9:22 AM   Specimen: Nasal Mucosa; Nasopharyngeal  Result Value Ref Range Status   MRSA by PCR NEGATIVE NEGATIVE Final    Comment:        The GeneXpert MRSA Assay (FDA approved for NASAL specimens only), is one component of a comprehensive MRSA colonization surveillance program. It is not intended to diagnose MRSA infection nor to guide or monitor treatment for MRSA infections. Performed at [facility], [place] [address], [place], [place] [address]   SARS Coronavirus 2 (CEPHEID - Performed in [facility] hospital lab), Hosp Order     Status: None   Collection Time: 06/24/19 10:53 AM   Specimen: Nasopharyngeal Swab  Result Value Ref Range Status   SARS Coronavirus 2 NEGATIVE NEGATIVE Final    Comment: (NOTE) If result is NEGATIVE SARS-CoV-2 target nucleic acids are NOT DETECTED. The SARS-CoV-2 RNA is generally detectable in upper and lower  respiratory specimens during the acute phase of infection. The  lowest  concentration of SARS-CoV-2 viral copies this assay can detect is 250  copies / mL. A negative result does not preclude SARS-CoV-2 infection  and should not be used as the sole basis for treatment or other  patient management decisions.  A negative result may occur with  improper specimen collection / handling, submission of specimen other  than nasopharyngeal swab, presence of viral mutation(s) within the  areas targeted by this assay, and inadequate number of  viral copies  (<250 copies / mL). A negative result must be combined with clinical  observations, patient history, and epidemiological information. If result is POSITIVE SARS-CoV-2 target nucleic acids are DETECTED. The SARS-CoV-2 RNA is generally detectable in upper and lower  respiratory specimens dur ing the acute phase of infection.  Positive  results are indicative of active infection with SARS-CoV-2.  Clinical  correlation with patient history and other diagnostic information is  necessary to determine patient infection status.  Positive results do  not rule out bacterial infection or co-infection with other viruses. If result is PRESUMPTIVE POSTIVE SARS-CoV-2 nucleic acids MAY BE PRESENT.   A presumptive positive result was obtained on the submitted specimen  and confirmed on repeat testing.  While 2019 novel coronavirus  (SARS-CoV-2) nucleic acids may be present in the submitted sample  additional confirmatory testing may be necessary for epidemiological  and / or clinical management purposes  to differentiate between  SARS-CoV-2 and other Sarbecovirus currently known to infect humans.  If clinically indicated additional testing with an alternate test  methodology [id number]) is advised. The SARS-CoV-2 RNA is generally  detectable in upper and lower respiratory sp ecimens during the acute  phase of infection. The expected result is Negative. Fact Sheet for Patients:  StrictlyIdeas.no  Fact Sheet for Healthcare Providers: BankingDealers.co.za This test is not yet approved or cleared by the [place] FDA and has been authorized for detection and/or diagnosis of SARS-CoV-2 by FDA under an Emergency Use Authorization (EUA).  This EUA will remain in effect (meaning this test can be used) for the duration of the COVID-19 declaration under Section 564(b)(1) of the Act, 21 U.S.C. section 360bbb-3(b)(1), unless the authorization is terminated or revoked sooner. Performed at [facility], [place] [address], [place], [place] [address]   Gram stain     Status: None   Collection Time: 06/24/19  2:32 PM   Specimen: Pleural, Left; Pleural Fluid  Result Value Ref Range Status   Specimen Description PLEURAL LEFT  Final   Special Requests NONE  Final   Gram Stain   Final    MODERATE WBC PRESENT, PREDOMINANTLY PMN NO ORGANISMS SEEN Performed at [facility], [place] [address] [place], [place] [address]    Report Status 06/24/2019 FINAL  Final  Acid Fast Smear (AFB)     Status: None   Collection Time: 06/24/19  2:32 PM   Specimen: Pleural, Left; Pleural Fluid  Result Value Ref Range Status   AFB Specimen Processing Concentration  Final   Acid Fast Smear Negative  Final    Comment: (NOTE) Performed At: [facility] [place], [place] [id number] Rush Farmer MD WE:[id number]    Source (AFB) PLEURAL  Final    Comment: LEFT Performed at [facility], [place] [address], [place], [place] [address]   Culture, body fluid-bottle     Status: None   Collection Time: 06/24/19  2:32 PM   Specimen: Pleura  Result Value Ref Range Status   Specimen Description PLEURAL LEFT  Final   Special Requests NONE  Final   Culture   Final    NO GROWTH 5 DAYS Performed at [facility], [address], [place],  [address]    Report Status 06/29/2019 FINAL  Final     Terri Piedra, NP [facility] [phone] Pager  06/29/2019  12:35 PM
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
## 2019-06-29 NOTE — Progress Notes (Signed)
Subjective: Patient was seen and evaluated at bedside on morning rounds. No acute events overnight. She endorses continued pain of chest wall and axillary area with movement. No other acute complaints and feels better. No nausea and vomiting  Objective:  Vital signs in last 24 hours: Vitals:   06/28/19 1753 06/28/19 1930 06/29/19 0430 06/29/19 0440  BP: 125/77 122/76 113/75   Pulse:  (!) 110    Resp: (!) 31 (!) 28 (!) 22 20  Temp: 98.7 F (37.1 C) 99.5 F (37.5 C)    TempSrc: Oral Oral    SpO2:  97%    Weight:    52.8 kg  Height:       Physical Exam Vitals signs and nursing note reviewed.  Constitutional:      General: She is not in acute distress.    Appearance: She is ill-appearing.  Cardiovascular:     Rate and Rhythm: Normal rate and regular rhythm.     Heart sounds: Normal heart sounds. No murmur.  Pulmonary:     Effort: Pulmonary effort is normal.     Breath sounds: Normal breath sounds. No wheezing or rales.  Abdominal:     General: There is no distension.     Palpations: Abdomen is soft.     Tenderness: There is no abdominal tenderness.  Musculoskeletal:        General: Swelling and tenderness present.  Neurological:     Mental Status: She is alert and oriented to person, place, and time.  Psychiatric:        Mood and Affect: Mood normal.        Behavior: Behavior normal.    Assessment/Plan:  Principal Problem:   MRSA bacteremia Active Problems:   Opioid overdose ([place])   Septic pulmonary embolism, bilateral    Suspected endocarditis   Active intravenous drug use   Severe opioid dependence ([facility])   Pericardial effusion   Rash/skin eruption  MRSA bacteremiaw/ septicemboli to lungs, paraspinal muscles &ileum.  Probably hx of rightsided endocarditiswith no acute vegetations on TEE.  BC 6/14MRSA positive BC 6/15 no growthso far [facility] 6/17MRSA growing in 4/4 bottles  BC 6/20negative x 1  Sites of infection: pulmonary emboli, suspected  endocarditis but echo without vegetations.  No septic infection, intracranial emboli Midline placed 6/26  Patient had increased leukocytosis past few days.  -CBC daily -Continue vancomycin (started 6/15. End date 06/29/2019 or later :6 weeks after negative MRSA  In [facility]) -Continue pain management -Continue Ensure -ID is following, appreciate recommendation--> Recommended repeat BC today  Parapneumonicpleural effusion Per thoracocentesis done 06/24/19. Thought to be secondary to MRSA infection.  Chest xray done 06/27/19 showed multifocal airspace opacities that have not improved from prior. Small bilateral pleural effusions.   Leukocytosis improving.  -Continue to monitor -May reiamge if have worsening of dyspnea  Right upper extremity edema Worsened yesterday. no changes needed to be made per iv team. She now ha bilateral arms swelling. No erythema. Will check for DVT. Also she has swelling at axillary area extending to upper arm. Seems to be other septic emboli to soft tissue? Will continue with antibiotic and monitor. -Upper extremity doppler to evaluate for DVT -Repeat BC  Opioid Use Disorder: Mild ileus and moderate stool burden onpreviousabdominal Xray   -Continue IV Dilaudid 1 mh q4h PRN -Continue Buspirone 15 mg BID -Continue Hydroxyzine 25 mg TID PRNfor anxiety -Continue PT,OT -Continuebentyl 20mg  bidPRN -Continue bowel regimen as needed -Off ofMethadone6/75may consider starting suboxone once patient is more stable  and pain is more controled  Dispo: Anticipated discharge after finishing antibiotic course  Dewayne Hatch, MD 06/29/2019, 4:50 AM Pager: [phone]

## 2019-06-30 DIAGNOSIS — Z9889 Other specified postprocedural states: Secondary | ICD-10-CM

## 2019-06-30 LAB — CBC
HCT: 21.8 % — ABNORMAL LOW (ref 36.0–46.0)
Hemoglobin: 6.7 g/dL — CL (ref 12.0–15.0)
MCH: 29.4 pg (ref 26.0–34.0)
MCHC: 30.7 g/dL (ref 30.0–36.0)
MCV: 95.6 fL (ref 80.0–100.0)
Platelets: 569 10*3/uL — ABNORMAL HIGH (ref 150–400)
RBC: 2.28 MIL/uL — ABNORMAL LOW (ref 3.87–5.11)
RDW: 18.9 % — ABNORMAL HIGH (ref 11.5–15.5)
WBC: 16 10*3/uL — ABNORMAL HIGH (ref 4.0–10.5)
nRBC: 0 % (ref 0.0–0.2)

## 2019-06-30 LAB — BASIC METABOLIC PANEL
Anion gap: 5 (ref 5–15)
BUN: 9 mg/dL (ref 6–20)
CO2: 29 mmol/L (ref 22–32)
Calcium: 7.6 mg/dL — ABNORMAL LOW (ref 8.9–10.3)
Chloride: 103 mmol/L (ref 98–111)
Creatinine, Ser: 0.55 mg/dL (ref 0.44–1.00)
GFR calc Af Amer: >60 mL/min (ref >60)
GFR calc non Af Amer: >60 mL/min (ref >60)
Glucose, Bld: 92 mg/dL (ref 70–99)
Potassium: 3.7 mmol/L (ref 3.5–5.1)
Sodium: 137 mmol/L (ref 135–145)

## 2019-06-30 LAB — HEMOGLOBIN AND HEMATOCRIT, BLOOD
HCT: 26.3 % — ABNORMAL LOW (ref 36.0–46.0)
Hemoglobin: 8.4 g/dL — ABNORMAL LOW (ref 12.0–15.0)

## 2019-06-30 LAB — PREPARE RBC (CROSSMATCH)

## 2019-06-30 MED ORDER — DICLOFENAC SODIUM 1 % TD GEL
2.0000 g | Freq: Two times a day (BID) | TRANSDERMAL | Status: DC | PRN
  Filled 2019-06-30: qty 100

## 2019-06-30 MED ORDER — SODIUM CHLORIDE 0.9% IV SOLUTION
Freq: Once | INTRAVENOUS | Status: AC
  Administered 2019-06-30: 16:00:00 via INTRAVENOUS

## 2019-06-30 NOTE — Progress Notes (Signed)
[place] [facility] for Infectious Disease  Date of Admission:  06/14/2019     Total days of antibiotics 17         ASSESSMENT/PLAN  Monica Stephens is a 38 year old female admitted with bilateral pulmonary septic emboli and suspected endocarditis complicated by MRSA bacteremia.  Hospitalization course has been complicated by development of hypercapnia and pleural effusion status post thoracentesis which have resolved.  Has remained afebrile over the last 24 hours with repeat blood cultures pending.  No evidence of DVT noted on vascular ultrasounds.  Currently on day 17 of vancomycin.  MRSA bacteremia -repeat cultures from 6/29 without growth to date.  Has disseminated infection.  Continue to monitor cultures.  Continue current dose of vancomycin  Disseminated MRSA infection -pulmonary septic emboli and suspected endocarditis.  Plan for treatment with 6 weeks of IV therapy followed by 2 weeks of oral therapy at discharge.  End date tentatively 7/31, which may change if cultures are positive.  Leukocytosis -stable today and slightly improved and likely from disseminated MRSA infection.  No evidence of DVT on vascular ultrasounds.  Continue to monitor fever curve and white blood cell count.  Therapeutic drug monitoring -renal function remained stable with no evidence of nephrotoxicity.  Continue current dose of vancomycin per pharmacy protocol.   Principal Problem:   MRSA bacteremia Active Problems:   Opioid overdose ([place])   Septic pulmonary embolism, bilateral    Suspected endocarditis   Active intravenous drug use   Severe opioid dependence ([place])   Pericardial effusion   Rash/skin eruption   . sodium chloride   Intravenous Once  . busPIRone  10 mg Oral BID  . feeding supplement (ENSURE ENLIVE)  237 mL Oral TID BM  . heparin  5,000 Units Subcutaneous Q8H  . levothyroxine  150 mcg Oral Once per day on Sun Tue Wed Thu Fri Sat  . levothyroxine  225 mcg Oral Q Mon  . multivitamin with  minerals  1 tablet Oral Daily  . polyethylene glycol  17 g Oral Daily  . sodium chloride flush  10-40 mL Intracatheter Q12H    SUBJECTIVE:  Afebrile overnight with stable leukocytosis. Feeling better today.   No Known Allergies   Review of Systems: Review of Systems  Constitutional: Negative for chills, fever and weight loss.  Respiratory: Negative for cough, shortness of breath and wheezing.   Cardiovascular: Negative for chest pain and leg swelling.  Gastrointestinal: Negative for abdominal pain, constipation, diarrhea, nausea and vomiting.  Skin: Negative for rash.      OBJECTIVE: Vitals:   06/29/19 2028 06/29/19 2325 06/30/19 0623 06/30/19 1005  BP: 119/77 111/73 109/72 110/69  Pulse: (!) 108 (!) 105 94 (!) 122  Resp: (!) 22 (!) 21 (!) 25 (!) 22  Temp: 98.9 F (37.2 C) 98.4 F (36.9 C) 97.9 F (36.6 C)   TempSrc: Oral Oral Oral   SpO2: 98% 100% 99% 99%  Weight:   50.4 kg   Height:       Body mass index is 20.32 kg/m.  Physical Exam Constitutional:      General: She is not in acute distress.    Appearance: She is well-developed. She is ill-appearing.     Comments: Lying in bed with head of bed elevated; pleasant.   Cardiovascular:     Rate and Rhythm: Normal rate and regular rhythm.     Heart sounds: Normal heart sounds.  Pulmonary:     Effort: Pulmonary effort is normal.  Breath sounds: Normal breath sounds.  Skin:    General: Skin is warm and dry.  Neurological:     Mental Status: She is alert and oriented to person, place, and time.  Psychiatric:        Mood and Affect: Mood normal.     Lab Results Lab Results  Component Value Date   WBC 16.0 (H) 06/30/2019   HGB 6.7 (LL) 06/30/2019   HCT 21.8 (L) 06/30/2019   MCV 95.6 06/30/2019   PLT 569 (H) 06/30/2019    Lab Results  Component Value Date   CREATININE 0.55 06/30/2019   BUN 9 06/30/2019   NA 137 06/30/2019   K 3.7 06/30/2019   CL 103 06/30/2019   CO2 29 06/30/2019    Lab Results   Component Value Date   ALT 20 06/21/2019   AST 33 06/21/2019   ALKPHOS 87 06/21/2019   BILITOT 0.3 06/21/2019     Microbiology: Recent Results (from the past 240 hour(s))  MRSA PCR Screening     Status: None   Collection Time: 06/21/19  9:22 AM   Specimen: Nasal Mucosa; Nasopharyngeal  Result Value Ref Range Status   MRSA by PCR NEGATIVE NEGATIVE Final    Comment:        The GeneXpert MRSA Assay (FDA approved for NASAL specimens only), is one component of a comprehensive MRSA colonization surveillance program. It is not intended to diagnose MRSA infection nor to guide or monitor treatment for MRSA infections. Performed at [facility], [place] [address], [place], [place] [address]   SARS Coronavirus 2 (CEPHEID - Performed in [facility] lab), Hosp Order     Status: None   Collection Time: 06/24/19 10:53 AM   Specimen: Nasopharyngeal Swab  Result Value Ref Range Status   SARS Coronavirus 2 NEGATIVE NEGATIVE Final    Comment: (NOTE) If result is NEGATIVE SARS-CoV-2 target nucleic acids are NOT DETECTED. The SARS-CoV-2 RNA is generally detectable in upper and lower  respiratory specimens during the acute phase of infection. The lowest  concentration of SARS-CoV-2 viral copies this assay can detect is 250  copies / mL. A negative result does not preclude SARS-CoV-2 infection  and should not be used as the sole basis for treatment or other  patient management decisions.  A negative result may occur with  improper specimen collection / handling, submission of specimen other  than nasopharyngeal swab, presence of viral mutation(s) within the  areas targeted by this assay, and inadequate number of viral copies  (<250 copies / mL). A negative result must be combined with clinical  observations, patient history, and epidemiological information. If result is POSITIVE SARS-CoV-2 target nucleic acids are DETECTED. The SARS-CoV-2 RNA is generally detectable in upper and  lower  respiratory specimens dur ing the acute phase of infection.  Positive  results are indicative of active infection with SARS-CoV-2.  Clinical  correlation with patient history and other diagnostic information is  necessary to determine patient infection status.  Positive results do  not rule out bacterial infection or co-infection with other viruses. If result is PRESUMPTIVE POSTIVE SARS-CoV-2 nucleic acids MAY BE PRESENT.   A presumptive positive result was obtained on the submitted specimen  and confirmed on repeat testing.  While 2019 novel coronavirus  (SARS-CoV-2) nucleic acids may be present in the submitted sample  additional confirmatory testing may be necessary for epidemiological  and / or clinical management purposes  to differentiate between  SARS-CoV-2 and other Sarbecovirus  currently known to infect humans.  If clinically indicated additional testing with an alternate test  methodology [id number]) is advised. The SARS-CoV-2 RNA is generally  detectable in upper and lower respiratory sp ecimens during the acute  phase of infection. The expected result is Negative. Fact Sheet for Patients:  StrictlyIdeas.no Fact Sheet for Healthcare Providers: BankingDealers.co.za This test is not yet approved or cleared by the [place] FDA and has been authorized for detection and/or diagnosis of SARS-CoV-2 by FDA under an Emergency Use Authorization (EUA).  This EUA will remain in effect (meaning this test can be used) for the duration of the COVID-19 declaration under Section 564(b)(1) of the Act, 21 U.S.C. section 360bbb-3(b)(1), unless the authorization is terminated or revoked sooner. Performed at [facility], [place] [address], [place], [place] [address]   Gram stain     Status: None   Collection Time: 06/24/19  2:32 PM   Specimen: Pleural, Left; Pleural Fluid  Result Value Ref Range Status   Specimen Description  PLEURAL LEFT  Final   Special Requests NONE  Final   Gram Stain   Final    MODERATE WBC PRESENT, PREDOMINANTLY PMN NO ORGANISMS SEEN Performed at [facility], [place] [address], [place], [place] [address]    Report Status 06/24/2019 FINAL  Final  Fungus Culture With Stain     Status: None (Preliminary result)   Collection Time: 06/24/19  2:32 PM   Specimen: Pleural, Left; Pleural Fluid  Result Value Ref Range Status   Fungus Stain Final report  Final    Comment: (NOTE) Performed At: [facility] [place], [place] [id number] Rush Farmer MD [id number]    Fungus (Mycology) Culture PENDING  Incomplete   Fungal Source PLEURAL  Final    Comment: LEFT Performed at [facility], [place] [address], [place], [place] [address]   Acid Fast Smear (AFB)     Status: None   Collection Time: 06/24/19  2:32 PM   Specimen: Pleural, Left; Pleural Fluid  Result Value Ref Range Status   AFB Specimen Processing Concentration  Final   Acid Fast Smear Negative  Final    Comment: (NOTE) Performed At: [facility] [place], [place] [id number] Rush Farmer MD QM:[id number]    Source (AFB) PLEURAL  Final    Comment: LEFT Performed at [facility], [place] [address] [place], [place] [address]   Culture, body fluid-bottle     Status: None   Collection Time: 06/24/19  2:32 PM   Specimen: Pleura  Result Value Ref Range Status   Specimen Description PLEURAL LEFT  Final   Special Requests NONE  Final   Culture   Final    NO GROWTH 5 DAYS Performed at [facility], [address], [place], [place] [address]    Report Status 06/29/2019 FINAL  Final  Fungus Culture Result     Status: None   Collection Time: 06/24/19  2:32 PM  Result Value Ref Range Status   Result 1 Comment  Final    Comment: (NOTE) KOH/Calcofluor preparation:  no fungus observed. Performed At: [facility] [place], [place] [id number]  Rush Farmer MD ZP:[id number]   Culture, blood (routine x 2)     Status: None (Preliminary result)   Collection Time: 06/29/19 11:20 AM   Specimen: BLOOD  Result Value Ref Range Status   Specimen Description BLOOD LEFT ANTECUBITAL  Final   Special Requests  AEROBIC BOTTLE ONLY Blood Culture adequate volume  Final   Culture   Final    NO GROWTH < 24 HOURS Performed at [facility], [place] [address] [place], [place] [address]    Report Status PENDING  Incomplete  Culture, blood (routine x 2)     Status: None (Preliminary result)   Collection Time: 06/29/19 11:54 AM   Specimen: BLOOD LEFT FOREARM  Result Value Ref Range Status   Specimen Description BLOOD LEFT FOREARM  Final   Special Requests AEROBIC BOTTLE ONLY Blood Culture adequate volume  Final   Culture   Final    NO GROWTH < 24 HOURS Performed at [facility], [place] [address] [place], [place] [address]    Report Status PENDING  Incomplete     Terri Piedra, [place] [facility] [place] Pager  06/30/2019  11:42 AM

## 2019-06-30 NOTE — Progress Notes (Signed)
Internal medicine paged ref to pt wanting pain med due to blood running and single lumen picc.  Oral order for Dilaudid given .5mg  q 4. Order cancelled due to pt stating oral meds will not help her. Pt is constantly demanding iv pain medication. Does not want any other type of pain relief. Jerald Kief, RN

## 2019-06-30 NOTE — Progress Notes (Signed)
Nutrition Follow-up  DOCUMENTATION CODES:   Not applicable  INTERVENTION:   -Monitor for BM (5 days without)   Continue Ensure Enlive po BID, each supplement provides 350 kcal and 20 grams of protein  Continue MVI daily  NUTRITION DIAGNOSIS:   Increased nutrient needs related to acute illness as evidenced by estimated needs.  Ongoing  GOAL:   Patient will meet greater than or equal to 90% of their needs  Progressing  MONITOR:   PO intake, Supplement acceptance, Skin, I & O's, Labs, Weight trends  REASON FOR ASSESSMENT:   Consult Assessment of nutrition requirement/status  ASSESSMENT:   Patient with PMH significant for bipolar disorder, thyroid cancer s/p thyroidectomy, and polysubstance abuse. Presents this admission with right PE with concern for developing empyema.   6/24- thoracentesis   Pt receiving care at time of RD visit. Meal completions charted as 25-100% for pt's last 6 meals. Per flowsheet, pt consuming Ensure BID. Will continue with current interventions. Monitor for BM. Regimen is in place.   Admission weight: 53.4 kg Current weight: 50.4 kg   Generalized +1 pitting edema noted.- improved from last week  Medications: MVI with minerals, miralax Labs: CBG 82-92  Diet Order:   Diet Order            Diet regular Room service appropriate? Yes; Fluid consistency: Thin  Diet effective now              EDUCATION NEEDS:   Education needs have been addressed  Skin:  Skin Assessment: Skin Integrity Issues: Skin Integrity Issues:: Other (Comment) Other: blister-mid chest  Last BM:  6/25  Height:   Ht Readings from Last 1 Encounters:  06/14/19 5\' 2"  (1.575 m)    Weight:   Wt Readings from Last 1 Encounters:  06/30/19 50.4 kg    Ideal Body Weight:  50 kg  BMI:  Body mass index is 20.32 kg/m.  Estimated Nutritional Needs:   Kcal:  1600-1800 kcal  Protein:  80-95 grams  Fluid:  >/= 1.6 L/day   Mariana Single RD,  [facility] Clinical Nutrition Pager # - [phone]

## 2019-06-30 NOTE — Progress Notes (Signed)
   Subjective: Patient was seen and evaluated at bedside on morning rounds. She is doing better. Mentions that her arm swelling and pain improved. No Nausea or vomiting. No bleeding.  Objective:  Vital signs in last 24 hours: Vitals:   06/29/19 1355 06/29/19 1808 06/29/19 2028 06/29/19 2325  BP:   119/77 111/73  Pulse: (!) 108 (!) 103 (!) 108 (!) 105  Resp: (!) 33 (!) 30 (!) 22 (!) 21  Temp:   98.9 F (37.2 C) 98.4 F (36.9 C)  TempSrc:   Oral Oral  SpO2: 99% 99% 98% 100%  Weight:      Height:       Ph/E: No acute distress CV: RRR, no murmur Pulm: Mild decreased breath sound, no crackle, no wheezing, bilateral upper extremity edema  Bilateral arm swelling/pittinf edema. No tenderness on arm. Tendrness and swelling of upper extremity mostly at left side extended to lateral wall of axilla Skin: Erythema on the chest improved.  Assessment/Plan:  Principal Problem:   MRSA bacteremia Active Problems:   Opioid overdose ([place])   Septic pulmonary embolism, bilateral    Suspected endocarditis   Active intravenous drug use   Severe opioid dependence ([facility])   Pericardial effusion   Rash/skin eruption  MRSA bacteremiaw/ septicemboli to lungs, paraspinal muscles &ileum.  Probably hx of rightsided endocarditiswith no acute vegetations on TEE.  BC 6/14MRSA positive BC 6/15 no growthso far [facility] 6/17MRSA growing in 4/4 bottles  BC 6/20negative x 1 BC 6/29 pending  Sites of infection: pulmonary emboli, suspected endocarditis but echo without vegetations.  No septic infection, or intracranial emboli Midline placed 6/26  Patient had increased leukocytosis past few days and low grade fever 2 nights ago. BC repeated yesterda 6/29. She has been afebrile overnight.  -CBC daily -F/u BC result -Continue vancomycin (started 6/15. 6 weeks after negative MRSA BC. ID will follow for duration as well) -Continue pain management -Continue Ensure -ID is following, appreciate  recommendation  Normpocytic anemia Hb 6.6 from 7 yesterday. No evidence of active bleeding. Hb has been gradually decreased.  -Transfuse RBC today -f/u post transfusion CBC -Cbc daily  Parapneumonicpleural effusion Per thoracocentesis done 06/24/19. Thought to be secondary to MRSA infection.  Chest xray done 06/27/19 showed multifocal airspace opacities that have not improved from prior. Small bilateral pleural effusions.   -F/u BC result -Continue to monitor -May re-iamge if have worsening of dyspnea  Bilateral upper extremity edema No DVT un duplex. [place] 6/29 she also has swelling at bilateral axillary area extending to upper arm.  Better today. No evidence of fluctuation.  -Will do POC ultrasound to evaluate soft tissue -Voltaren gel PRN -Continue arms elevation  Opioid Use Disorder: Mild ileus and moderate stool burden onpreviousabdominal Xray  No change in management.  -Continue IV Dilaudid 1 mh q4h PRN -Continue Buspirone 15 mg BID -ContinueHydroxyzine 25 mg TID PRNfor anxiety -Continue PT,OT -Continuebentyl 20mg  bidPRN -Continue bowel regimen as needed -Off ofMethadone6/66may consider starting suboxone once patient is more stable and pain is more controled  Dispo: Anticipated discharge in few weeks, based on antibiotic duration course (pending BC)  Dewayne Hatch, MD 06/30/2019, 4:37 AM Pager: [phone]

## 2019-06-30 NOTE — Progress Notes (Signed)
Lab identified critical hemoglobin of 6.6. will notify provider and await orders.  Jerald Kief, RN

## 2019-06-30 NOTE — Progress Notes (Signed)
Pt constantly requesting pain meds. Pt told numerous times pain meds are q 4hrs. Pt states she needs more dilaudid. RN advised her that she needs to be able to tolerate some pain and that she needs to move more and be upright. Pt given meds as scheduled. Tylenol offered and pt denies. Pt also states she does not want po pain meds, suboxone, or methadone. She states they will not help her. Jerald Kief, RN

## 2019-07-01 ENCOUNTER — Inpatient Hospital Stay (HOSPITAL_COMMUNITY): Payer: Self-pay

## 2019-07-01 DIAGNOSIS — G4700 Insomnia, unspecified: Secondary | ICD-10-CM

## 2019-07-01 DIAGNOSIS — M7989 Other specified soft tissue disorders: Secondary | ICD-10-CM

## 2019-07-01 LAB — IRON AND TIBC
Iron: 68 ug/dL (ref 28–170)
Saturation Ratios: 37 % — ABNORMAL HIGH (ref 10.4–31.8)
TIBC: 183 ug/dL — ABNORMAL LOW (ref 250–450)
UIBC: 115 ug/dL

## 2019-07-01 LAB — BASIC METABOLIC PANEL
Anion gap: 6 (ref 5–15)
BUN: 10 mg/dL (ref 6–20)
CO2: 28 mmol/L (ref 22–32)
Calcium: 8 mg/dL — ABNORMAL LOW (ref 8.9–10.3)
Chloride: 103 mmol/L (ref 98–111)
Creatinine, Ser: 0.61 mg/dL (ref 0.44–1.00)
GFR calc Af Amer: >60 mL/min (ref >60)
GFR calc non Af Amer: >60 mL/min (ref >60)
Glucose, Bld: 108 mg/dL — ABNORMAL HIGH (ref 70–99)
Potassium: 3.6 mmol/L (ref 3.5–5.1)
Sodium: 137 mmol/L (ref 135–145)

## 2019-07-01 LAB — FERRITIN: Ferritin: 369 ng/mL — ABNORMAL HIGH (ref 11–307)

## 2019-07-01 LAB — CBC
HCT: 25.5 % — ABNORMAL LOW (ref 36.0–46.0)
Hemoglobin: 8.2 g/dL — ABNORMAL LOW (ref 12.0–15.0)
MCH: 30.3 pg (ref 26.0–34.0)
MCHC: 32.2 g/dL (ref 30.0–36.0)
MCV: 94.1 fL (ref 80.0–100.0)
Platelets: 555 10*3/uL — ABNORMAL HIGH (ref 150–400)
RBC: 2.71 MIL/uL — ABNORMAL LOW (ref 3.87–5.11)
RDW: 18.1 % — ABNORMAL HIGH (ref 11.5–15.5)
WBC: 18.4 10*3/uL — ABNORMAL HIGH (ref 4.0–10.5)
nRBC: 0 % (ref 0.0–0.2)

## 2019-07-01 LAB — VITAMIN B12: Vitamin B-12: 5410 pg/mL — ABNORMAL HIGH (ref 180–914)

## 2019-07-01 IMAGING — US LEFT UPPER EXTREMITY SOFT TISSUE ULTRASOUND LIMITED
1 series · 14 of 25 positions shown · non-contrast
Comparison: None.

CLINICAL DATA: Left arm swelling and erythema for 4 weeks. MRSA
bacteremia with leukocytosis. Question upper arm abscess.

EXAM:
ULTRASOUND LEFT UPPER EXTREMITY LIMITED
TECHNIQUE: Ultrasound examination of the upper extremity soft tissues was
performed in the area of clinical concern.

[Series 1: left upper extremity soft tissue ultrasound limite · 33 acquisitions, 14 frames shown]
[im 1/33]
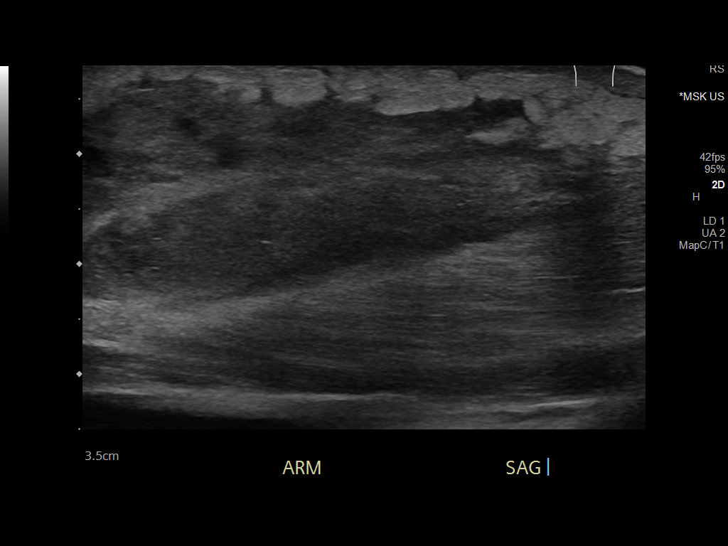
[im 3/33]
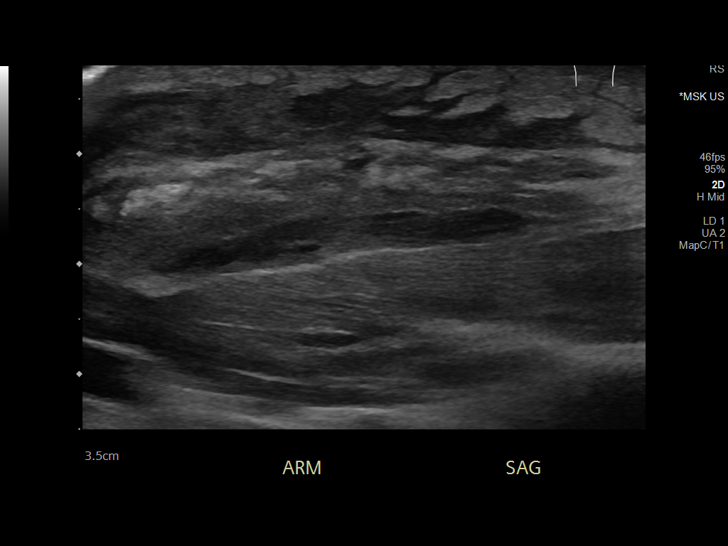
[im 6/33]
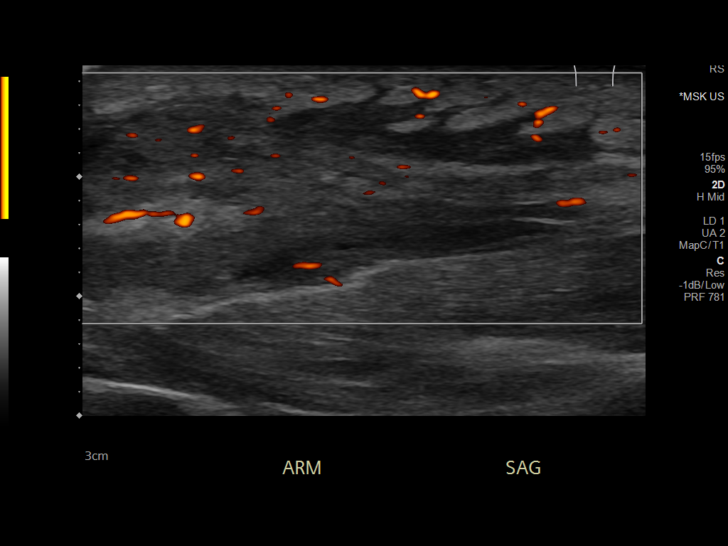
[im 9/33]
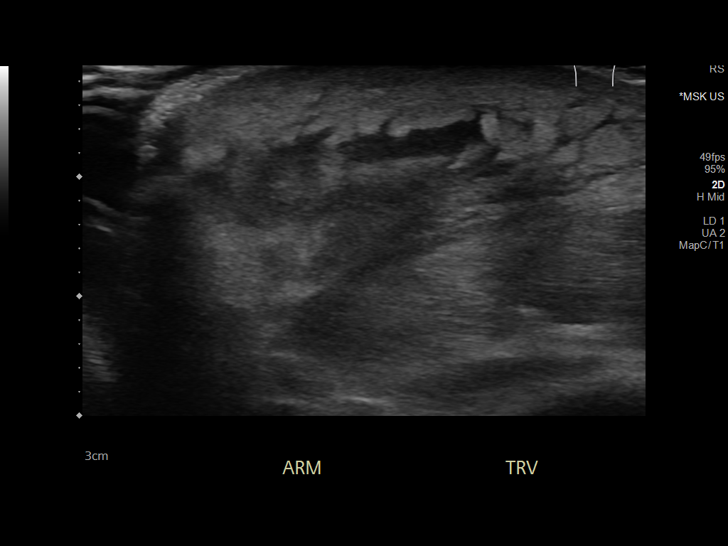
[im 11/33]
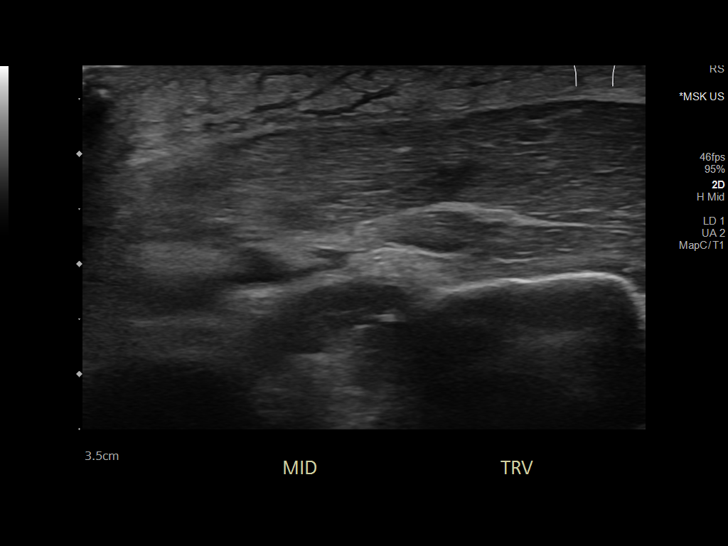
[im 13/33]
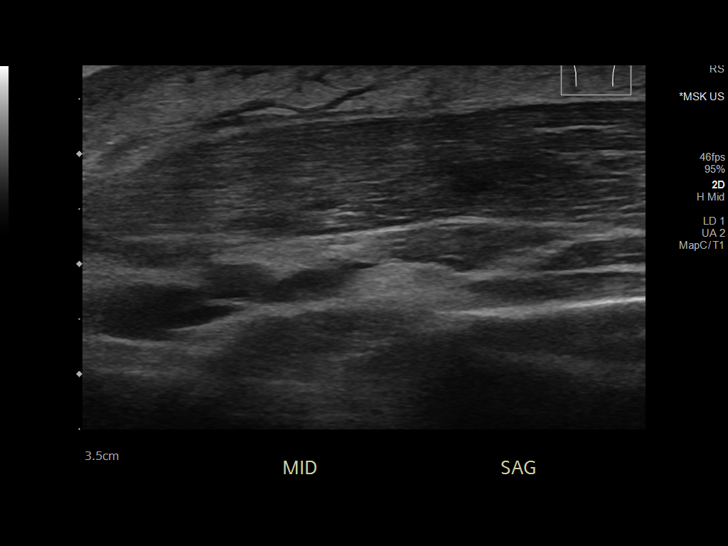
[im 15/33]
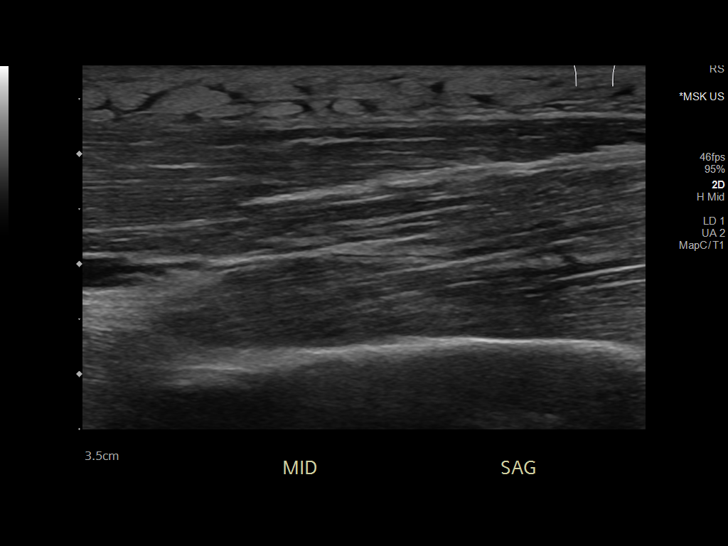
[im 18/33]
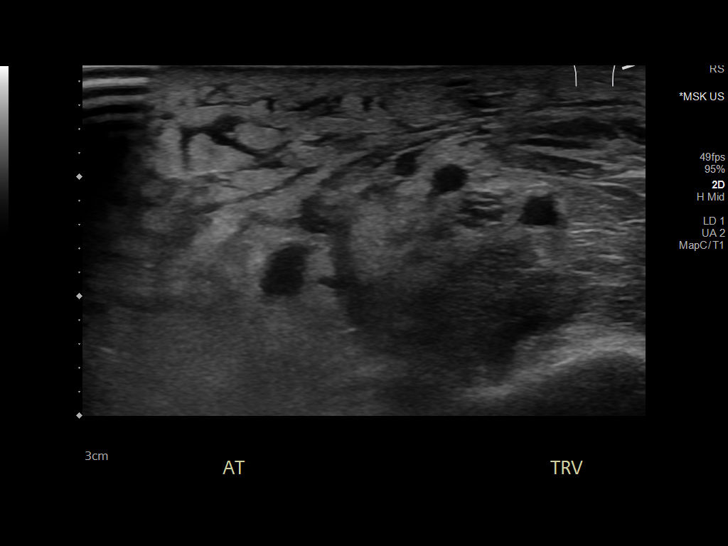
[im 21/33]
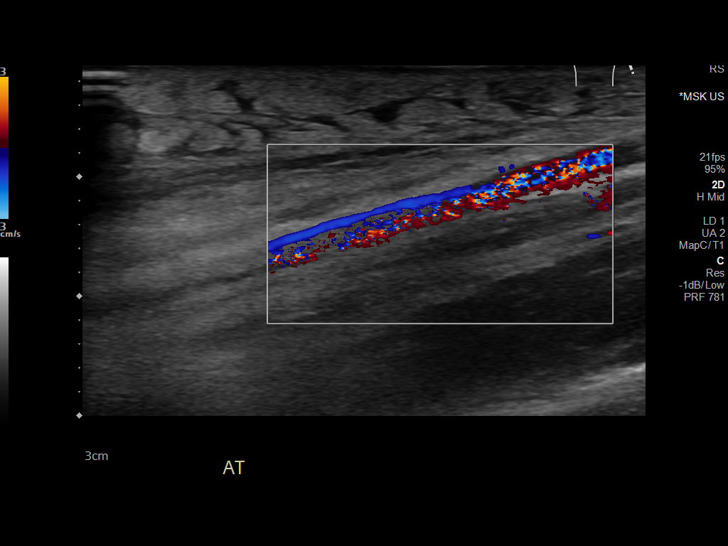
[im 22/33]
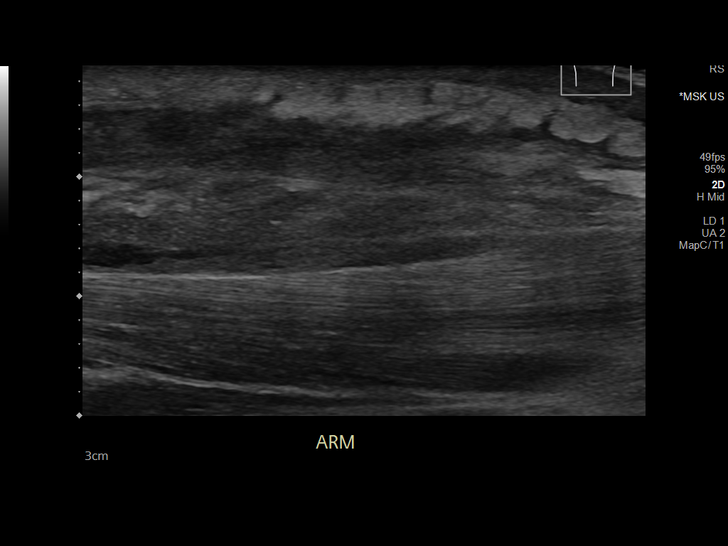
[im 25/33]
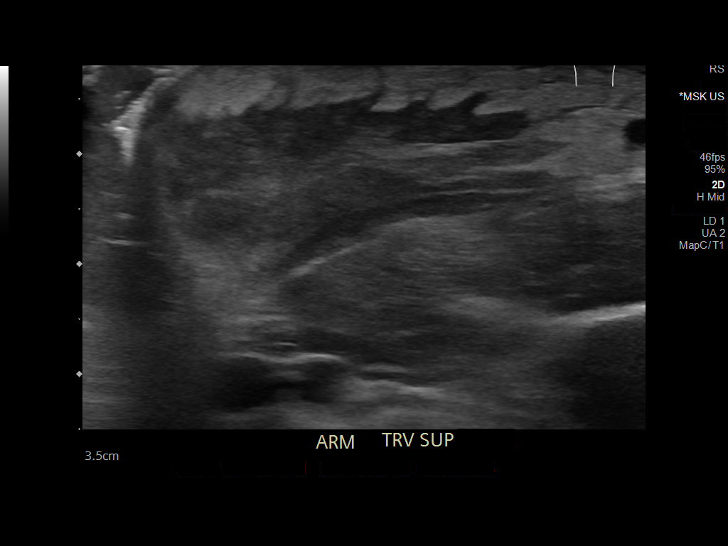
[im 27/33]
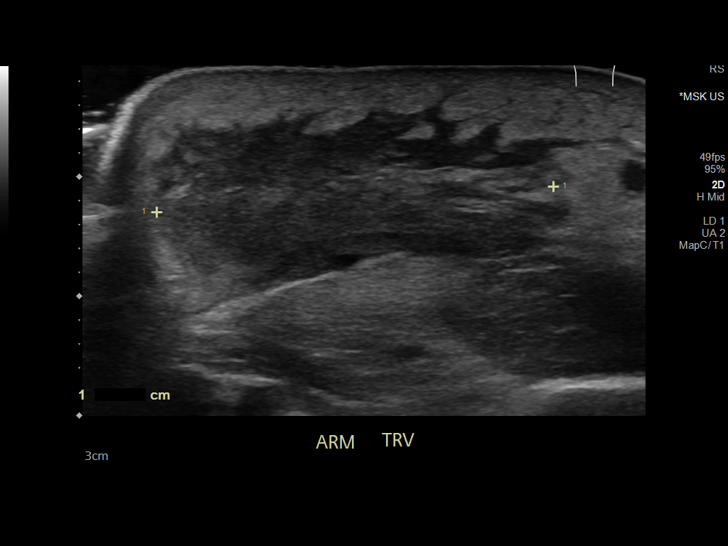
[im 30/33]
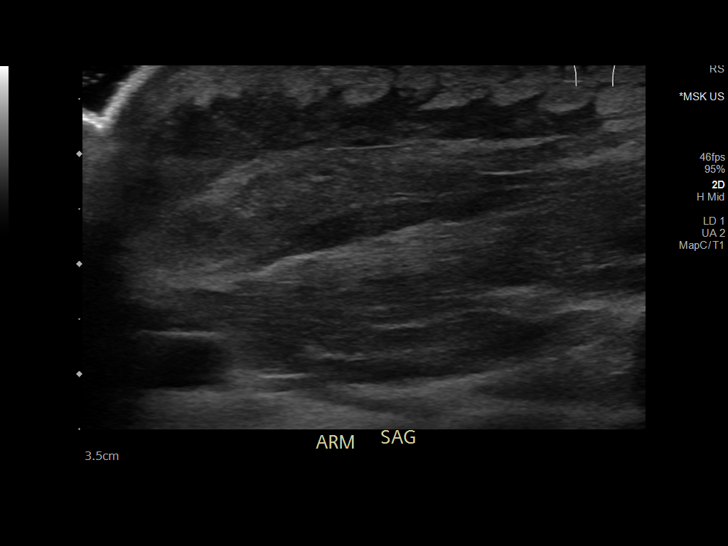
[im 33/33]
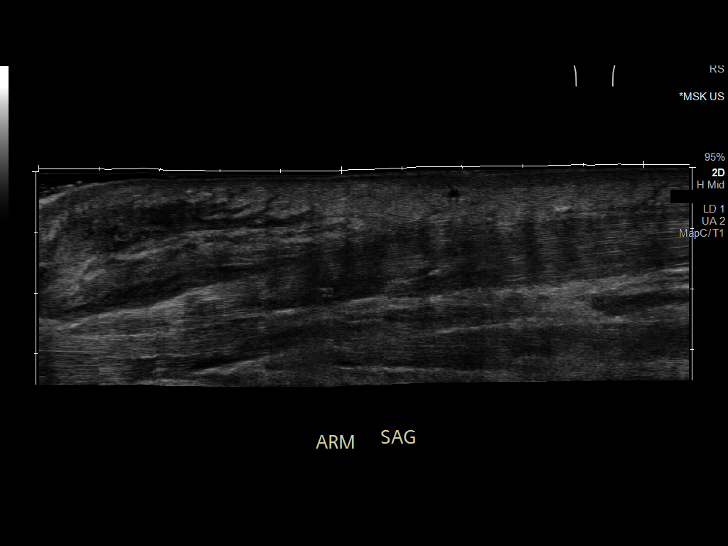

[14 of 25 positions shown; findings below may reference images not displayed]

FINDINGS: In the area of concern, there is a large ill-defined area of
decreased echogenicity interdigitating with the subcutaneous fat.
This measures approximately 4.8 x 1.3 x 3.3 cm. There is no
associated internal blood flow. There is no well-defined fluid
collection to suggest an abscess. No foreign bodies are identified.
IMPRESSION: There is an ill-defined area of decreased echogenicity in the
subcutaneous fat of the left upper arm which could reflect a
phlegmon or infiltrating edema. No well-defined abscess demonstrated
at this time. Consider further evaluation with CT or MRI.

## 2019-07-01 IMAGING — CT CT OF THE UPPER LEFT ARM WITH CONTRAST
2 of 3 series · 9 of 33 positions shown, 11 images · IV contrast (agent unspecified)
Comparison: Extremity ultrasound of [DATE]

CONTRAST:  100mL OMNIPAQUE IOHEXOL 300 MG/ML  SOLN

CLINICAL DATA: Pain and swelling in the left upper extremity near
the axilla, concerning for abscess.

EXAM:
CT OF THE UPPER LEFT EXTREMITY WITH CONTRAST
TECHNIQUE: Multidetector CT imaging of the upper left extremity was performed
according to the standard protocol following intravenous contrast
administration.

[Series 4: extremity soft tissue · axial · 0.44mm/px · z∈[+1002,+1536]mm · 6 of 348 slices shown, 8 images]
[im 54/348  soft-tissue]
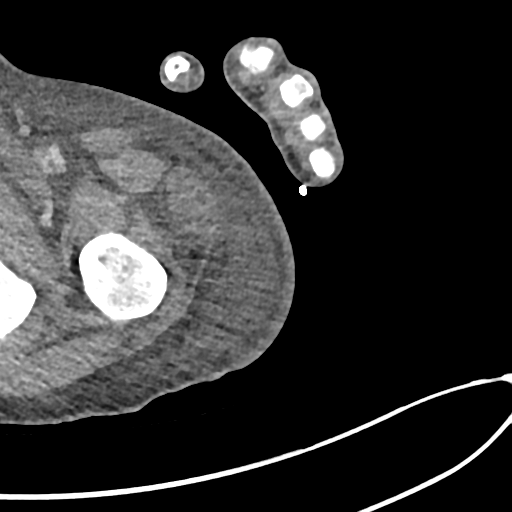
[im 54/348  bone]
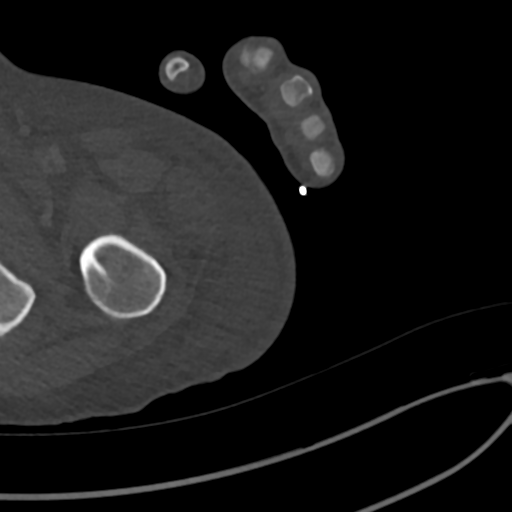
[im 107/348  bone]
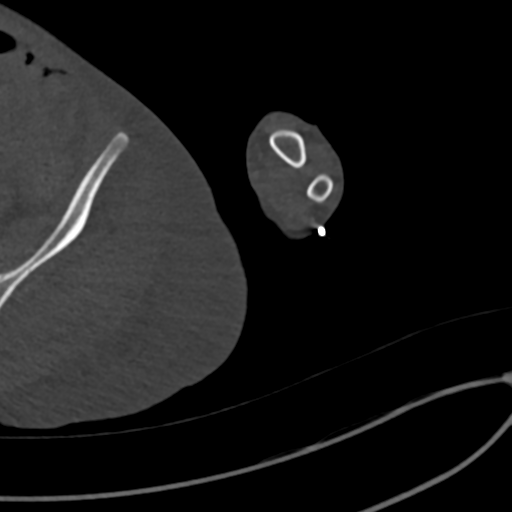
[im 161/348  bone]
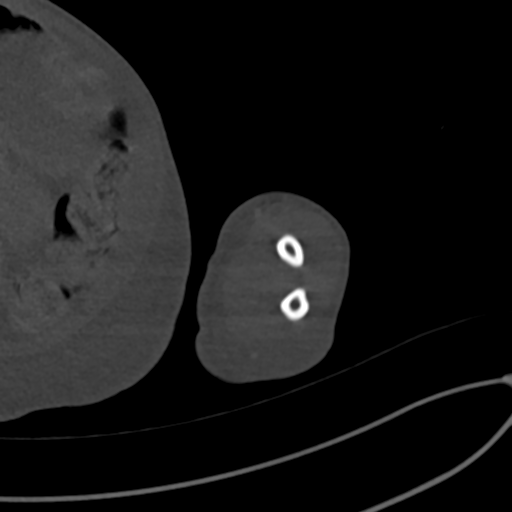
[im 214/348  bone]
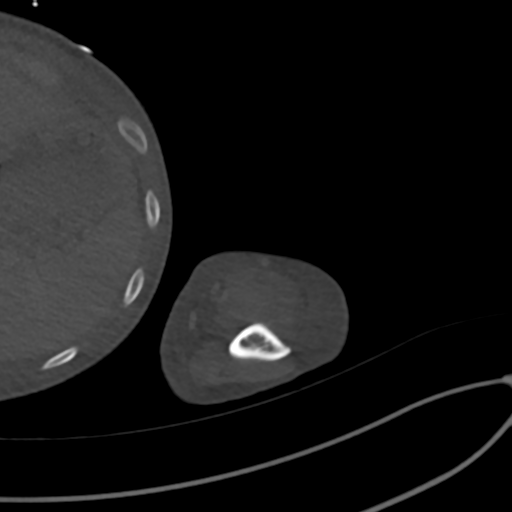
[im 267/348  soft-tissue]
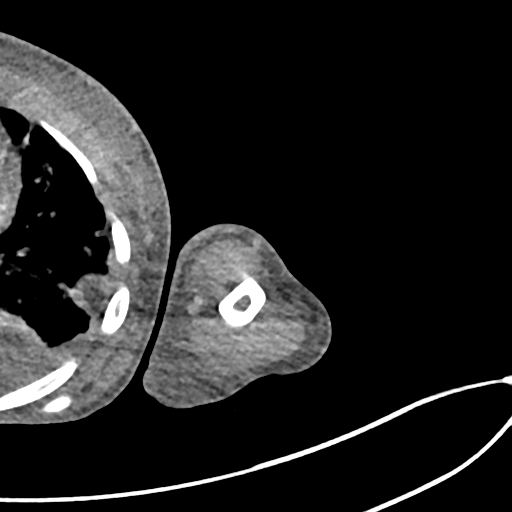
[im 267/348  bone]
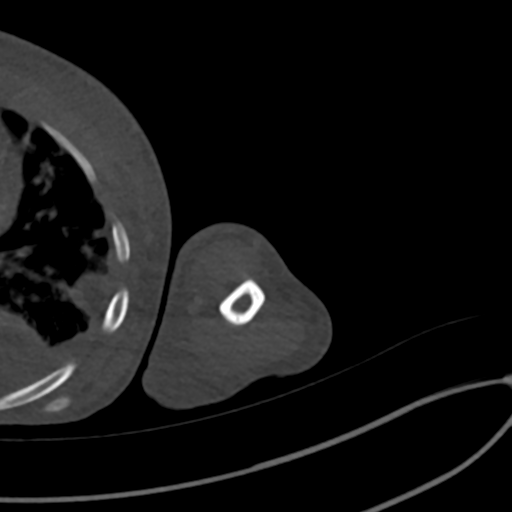
[im 321/348  bone]
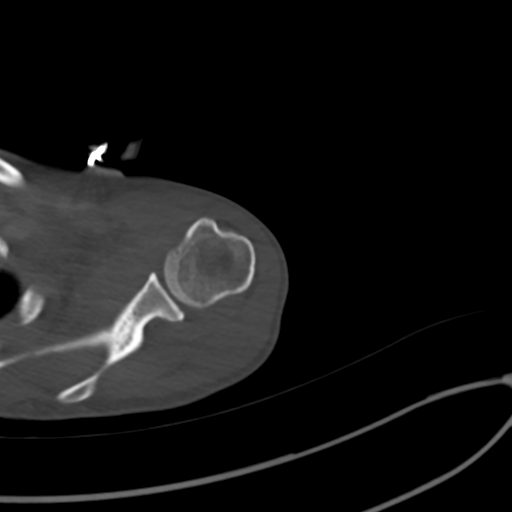

[Series 8: cor soft tissue · coronal · 0.40mm/px · 3 of 103 slices shown]
[im 21/103  bone]
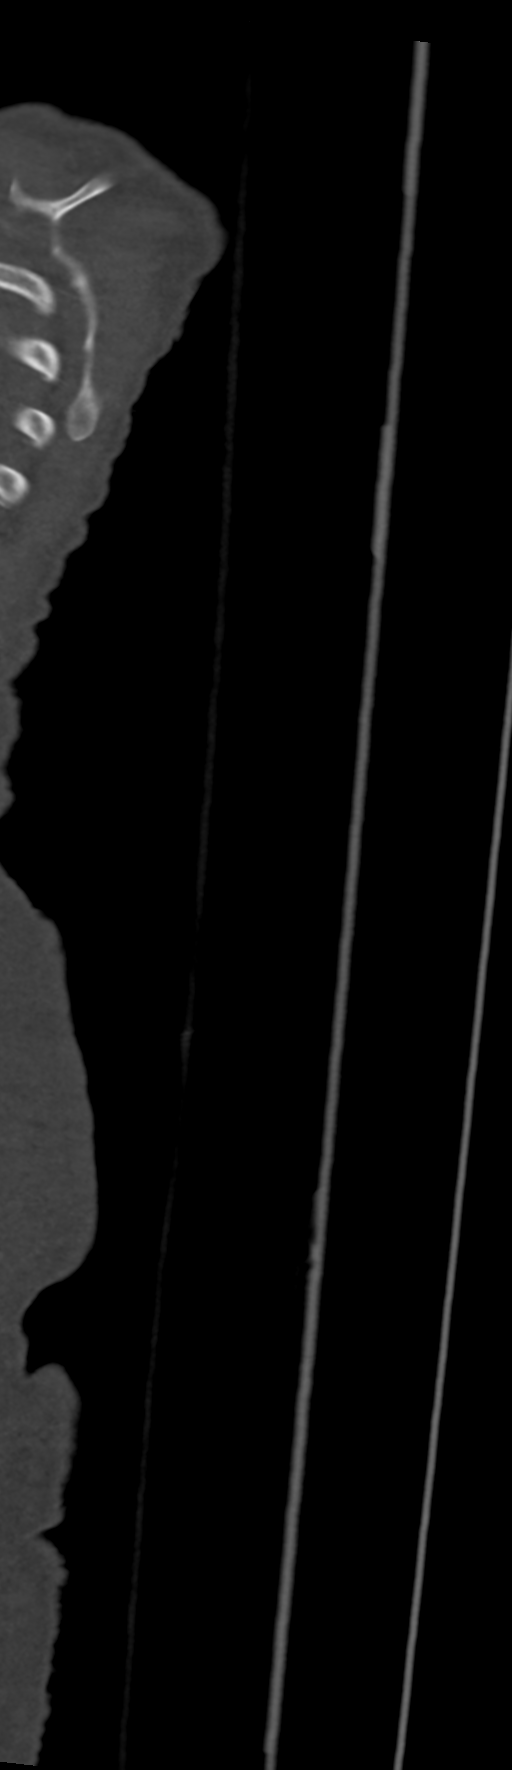
[im 41/103  bone]
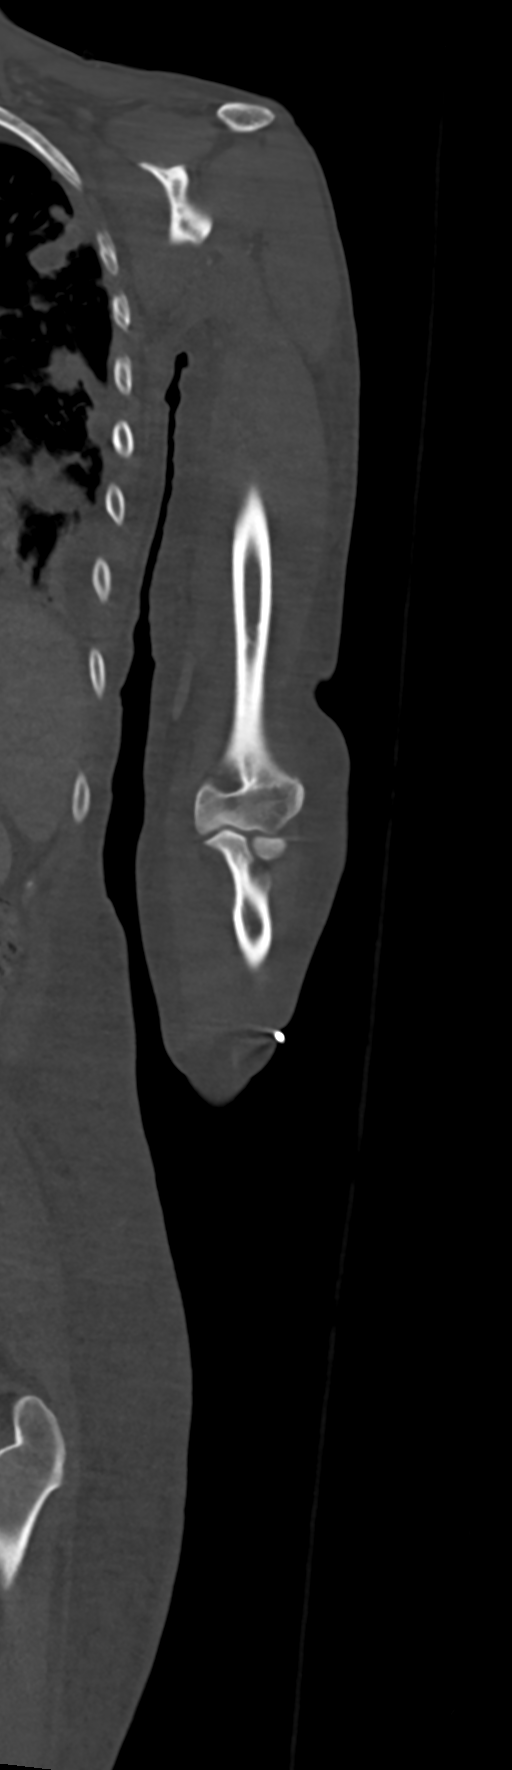
[im 62/103  bone]
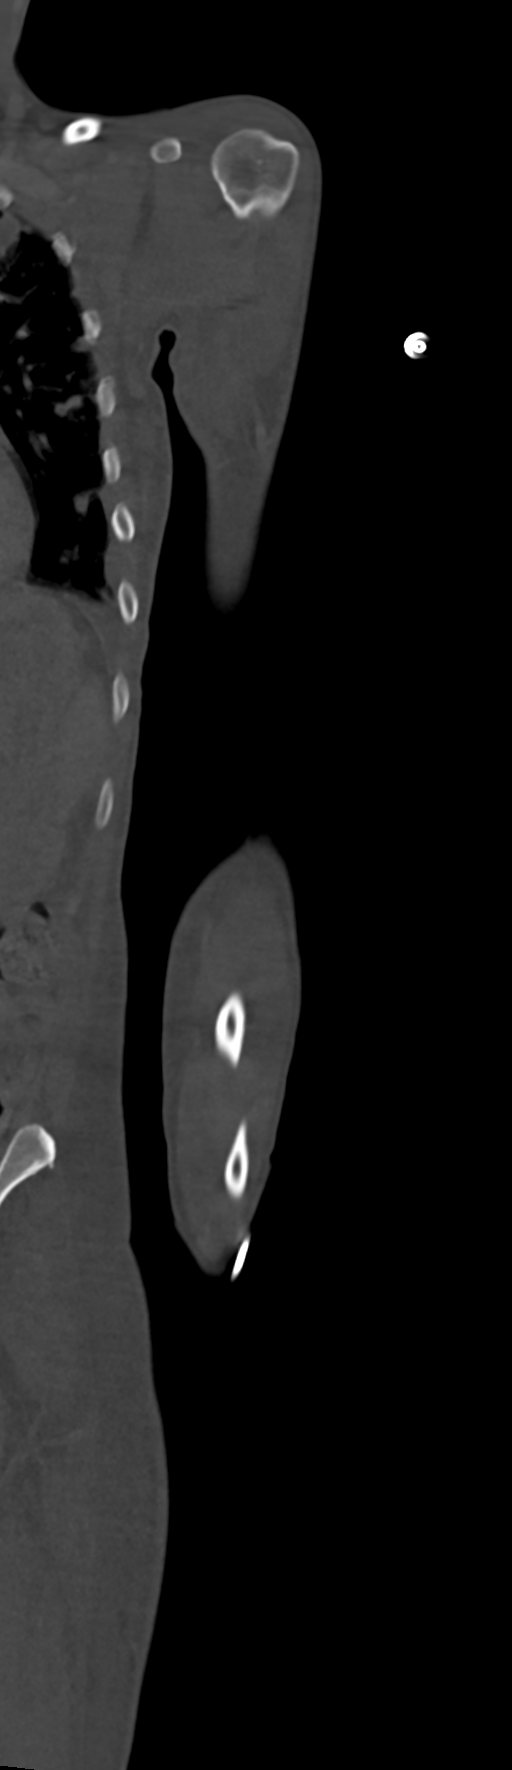

[9 of 33 positions shown; findings below may reference images not displayed]

FINDINGS: Bones/Joint/Cartilage

No bony destructive findings to suggest osteomyelitis.

Ligaments

Suboptimally assessed by CT.

Muscles and Tendons

We partially image fluid collections tracking along left pectoralis
musculature, as were shown on the chest CT of [DATE]. Abscesses
along the pectoralis major and minor are a distinct possibility, and
may be extending down in the breast and chest wall on the left side.
These fluid collections extend towards the humeral insertion of the
pectoralis muscle.

[name] arm is by her side and accordingly we include some of
the left-side of the body and upper thigh. Included in this there
appear to be small abscesses in the left biceps femoris muscle and
lateral portion of the left adductor magnus for example on images

Soft tissues

Diffuse subcutaneous edema in the left arm and along the body wall
compatible with third spacing of fluid.

Scattered rounded airspace opacities in the lungs probably from
septic emboli. Left pleural effusion.
IMPRESSION: 1. Abscesses tracking along the left pectoralis musculature, and
along the left upper chest wall. Pectoralis abscess tracks along the
distal pectoralis muscle as it approaches the humerus. No
osteomyelitis identified.
2. Incidental inclusion of parts of the left lower extremity
indicate abscesses involving the left biceps femoris muscle and
lateral portion of the left adductor magnus muscle.
3. Scattered pulmonary nodules probably from septic emboli, as
suggested on prior CT scan. Moderate left pleural effusion.
4. Generalized subcutaneous edema compatible with third spacing of
fluid.
5. For this exam, imaging was obtained from the shoulder down to the
fingers. In the future, this will require 3 separate musculoskeletal
CT exams, a CT of the humerus, a CT of the forearm, and a CT of the
hand. There is no one exam or CPT code that encompasses a CT exam of
the entire upper extremity, and if multiple different regions need
to be scanned, then they need to be requested individually.

## 2019-07-01 MED ORDER — ENOXAPARIN SODIUM 40 MG/0.4ML ~~LOC~~ SOLN
40.0000 mg | SUBCUTANEOUS | Status: DC
  Administered 2019-07-03 – 2019-07-06 (×4): 40 mg via SUBCUTANEOUS
  Filled 2019-07-01 (×9): qty 0.4

## 2019-07-01 MED ORDER — RAMELTEON 8 MG PO TABS
8.0000 mg | ORAL_TABLET | Freq: Every day | ORAL | Status: AC
  Administered 2019-07-01: 8 mg via ORAL
  Filled 2019-07-01: qty 1

## 2019-07-01 MED ORDER — ENOXAPARIN SODIUM 40 MG/0.4ML ~~LOC~~ SOLN: 40.0000 mg | SUBCUTANEOUS | Status: DC

## 2019-07-01 MED ORDER — IOHEXOL 300 MG/ML  SOLN
100.0000 mL | Freq: Once | INTRAMUSCULAR | Status: AC | PRN
  Administered 2019-07-01: 100 mL via INTRAVENOUS

## 2019-07-01 NOTE — Plan of Care (Signed)
Continue to monitor
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
## 2019-07-01 NOTE — Plan of Care (Signed)
Poc progressing.  

## 2019-07-01 NOTE — Progress Notes (Signed)
   Subjective: Pt laying in bed drowsy and reports unchanged pain and discomfort that is nevertheless adequately controlled with current pain regimen. States that she cannot take deep breaths or yawn due to chest pain and is unable to raise her arms due to bilateral, axillary upper arm pain. She reports her pain has not improved or worsened since being here. She reports regular bowel movements and trouble sleeping. Pt  would like assistance with bedside bath.   Objective:  Vital signs in last 24 hours: Vitals:   06/30/19 1900 06/30/19 1935 07/01/19 0015 07/01/19 0700  BP: 114/74 115/74 123/78 128/87  Pulse:  (!) 102 80 (!) 116  Resp: (!) 33 20 20 (!) 27  Temp:  99.1 F (37.3 C) 98.3 F (36.8 C) 98.1 F (36.7 C)  TempSrc:  Oral Oral Oral  SpO2:  92% 93% 94%  Weight:    49.2 kg  Height:       Physical Exam  Constitutional: No distress.  HENT:  Head: Normocephalic and atraumatic.  Cardiovascular: Normal rate, regular rhythm and normal heart sounds.  No murmur heard. No lower extremity edema  Pulmonary/Chest:  Increased respiratory rate as she is unable to breath deeply, unable to adequately assess breath sounds   Abdominal: Soft. Bowel sounds are normal.  Skin: Skin is warm and dry. She is not diaphoretic.  7x7 centimeter soft tissue swelling in proximal left upper extremity near the axilla, warm to touch, fluctuant, increased in size from yesterday, no erythema, tender to palpation; pt similarly tender to palpation in right axilla but no obvious swelling or fluctuance noted  Nursing note and vitals reviewed.   Assessment/Plan:  Principal Problem:   MRSA bacteremia -pt undergoing treatment with 6 weeks of vanc for MRSA bactermia w/ septic emboli to lungs, paraspinal muscles and ilium -given a recent leukocytosis and low grade fever pt underwent additional blood cx which resulted today and were negative -continue IV vanc for 6 weeks from first negative cx  Anemia  -pt has had  downtrending Hgb and received a blood transfusion yesterday after a Hgb of 6.7, now above 8 -pt reports a history of anemia as a child which she remembers being very severe -RDW and MCV suggestive of a possible nutritional deficiency so we ordered a B12 which showed an elevated B12 level -could also be anemia of chronic disease  -patient denies any bleeding at this time -we will follow CBC and will transfuse again if less than 7  Left Axillary Swelling -patient reports increasing pain in her bilateral axilla with the appearance today of a significant swelling that is tender to palpation, warm and fluctuant in her left axilla specifically  -[place] ordered which demonstrated an ill-defined area which could represent a phlegmon or infiltrating edema so a CT was recommended and ordered - will f/u results as it may be the cause of her recent low grade fever and leukocytosis   Insomnia -pt reported trouble sleeping which could be 2/2 to pain but ordered Ramelteon to help, 1 dose and will continue if useful   Active Problems:   Opioid overdose (HCC)   Septic pulmonary embolism, bilateral    Suspected endocarditis   Active intravenous drug use   Severe opioid dependence (HCC)   Pericardial effusion   Rash/skin eruption  Dispo: Anticipated discharge pending completion of antibiotic therapy.   Al Decant, MD 07/01/2019, 8:44 AM Pager: [phone]

## 2019-07-02 ENCOUNTER — Inpatient Hospital Stay (HOSPITAL_COMMUNITY): Payer: Self-pay

## 2019-07-02 DIAGNOSIS — R0789 Other chest pain: Secondary | ICD-10-CM

## 2019-07-02 DIAGNOSIS — M71012 Abscess of bursa, left shoulder: Secondary | ICD-10-CM

## 2019-07-02 DIAGNOSIS — L02213 Cutaneous abscess of chest wall: Secondary | ICD-10-CM

## 2019-07-02 LAB — TYPE AND SCREEN
ABO/RH(D): A NEG
Antibody Screen: POSITIVE
Unit division: 0

## 2019-07-02 LAB — BASIC METABOLIC PANEL
Anion gap: 8 (ref 5–15)
BUN: 14 mg/dL (ref 6–20)
CO2: 27 mmol/L (ref 22–32)
Calcium: 7.9 mg/dL — ABNORMAL LOW (ref 8.9–10.3)
Chloride: 101 mmol/L (ref 98–111)
Creatinine, Ser: 0.56 mg/dL (ref 0.44–1.00)
GFR calc Af Amer: >60 mL/min (ref >60)
GFR calc non Af Amer: >60 mL/min (ref >60)
Glucose, Bld: 101 mg/dL — ABNORMAL HIGH (ref 70–99)
Potassium: 3.5 mmol/L (ref 3.5–5.1)
Sodium: 136 mmol/L (ref 135–145)

## 2019-07-02 LAB — CBC
HCT: 26.4 % — ABNORMAL LOW (ref 36.0–46.0)
Hemoglobin: 8.4 g/dL — ABNORMAL LOW (ref 12.0–15.0)
MCH: 30.2 pg (ref 26.0–34.0)
MCHC: 31.8 g/dL (ref 30.0–36.0)
MCV: 95 fL (ref 80.0–100.0)
Platelets: 546 10*3/uL — ABNORMAL HIGH (ref 150–400)
RBC: 2.78 MIL/uL — ABNORMAL LOW (ref 3.87–5.11)
RDW: 18.1 % — ABNORMAL HIGH (ref 11.5–15.5)
WBC: 15.7 10*3/uL — ABNORMAL HIGH (ref 4.0–10.5)
nRBC: 0 % (ref 0.0–0.2)

## 2019-07-02 LAB — BPAM RBC
Blood Product Expiration Date: 202007122359
ISSUE DATE / TIME: 202006301607
Unit Type and Rh: 600

## 2019-07-02 IMAGING — CT CT CHEST WITH CONTRAST
2 of 7 series · 12 of 36 positions shown, 15 images · IV contrast (APPLIED)
Comparison: [DATE]
COMPARISON: [DATE]

Addendum:
CLINICAL DATA: Bacteremia.  Enlarging [R8].

EXAM:
CT CHEST WITH CONTRAST
TECHNIQUE: Multidetector CT imaging of the chest was performed during
intravenous contrast administration.
CONTRAST:  75mL OMNIPAQUE IOHEXOL 300 MG/ML  SOLN

[Series 3: thorax 2.0 i31f 2 · axial · 0.68mm/px · z∈[+1083,+1319]mm · 11 of 132 slices shown, 14 images]
[im 7/132  mediastinal]
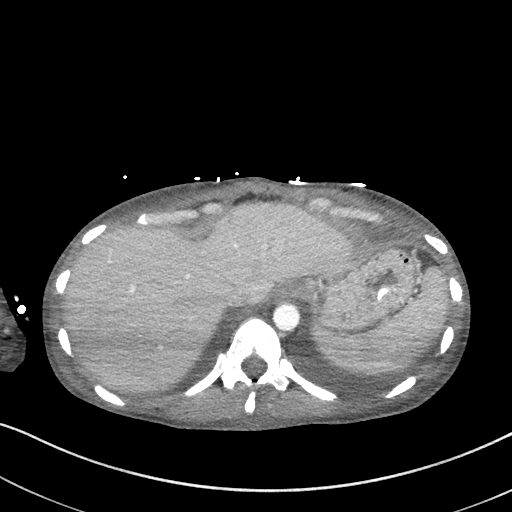
[im 7/132  lung]
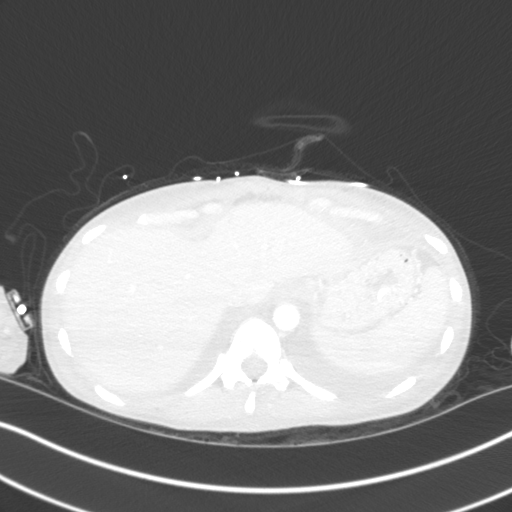
[im 21/132  lung]
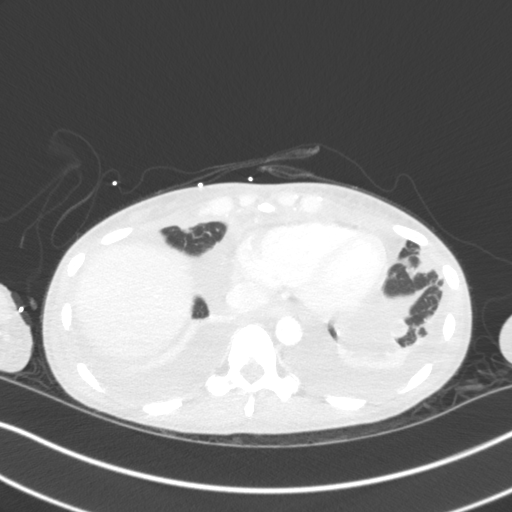
[im 35/132  lung]
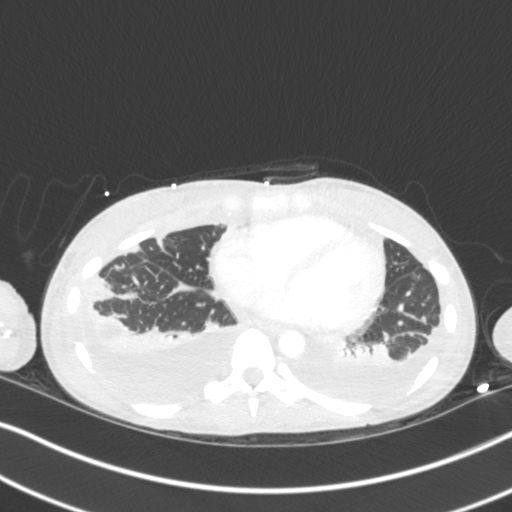
[im 42/132  lung]
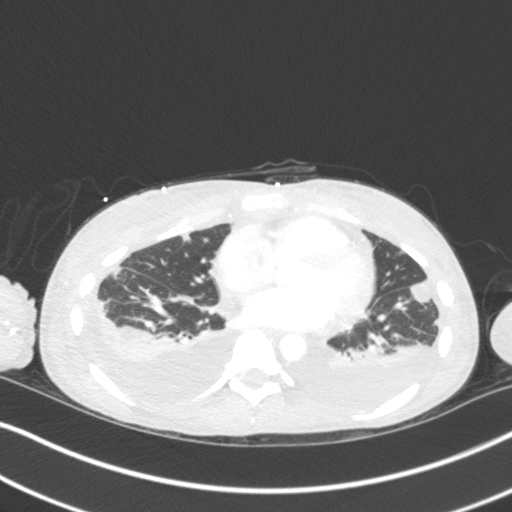
[im 56/132  mediastinal]
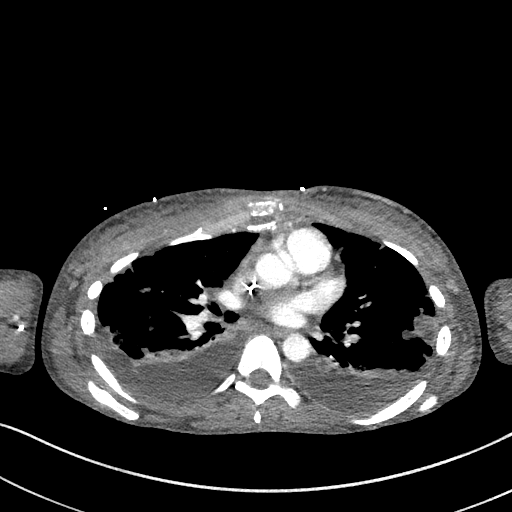
[im 56/132  lung]
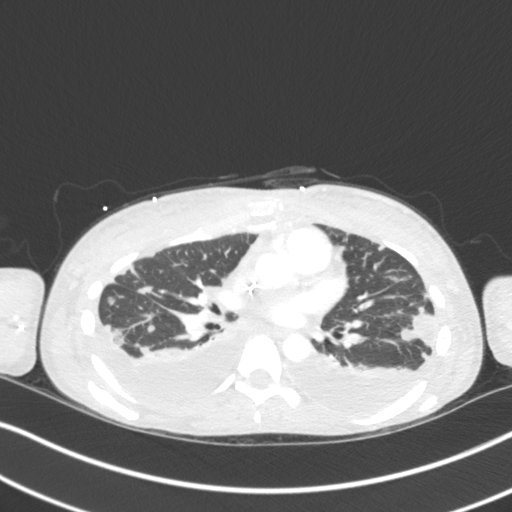
[im 69/132  lung]
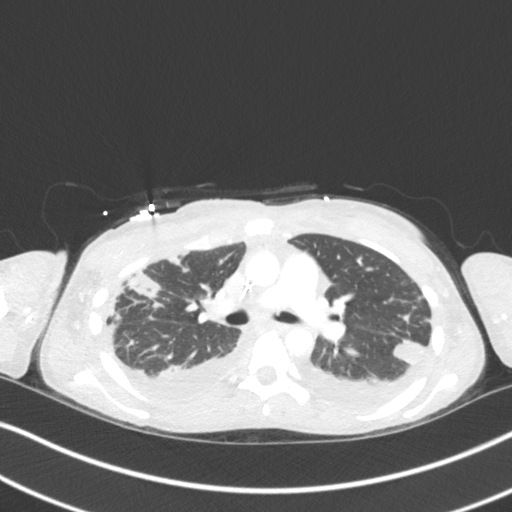
[im 76/132  lung]
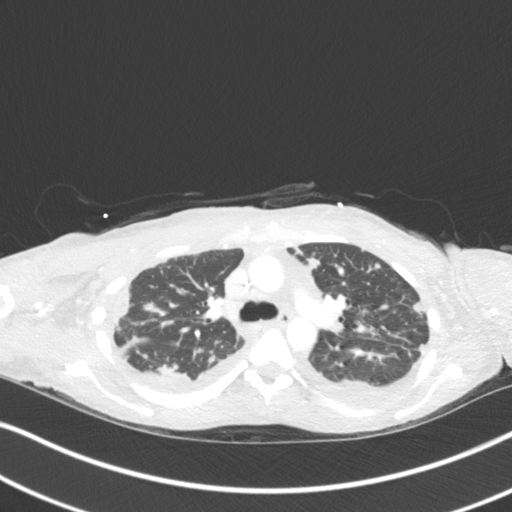
[im 90/132  lung]
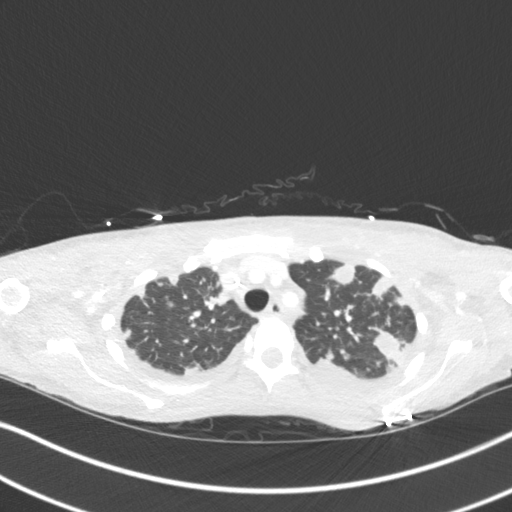
[im 97/132  mediastinal]
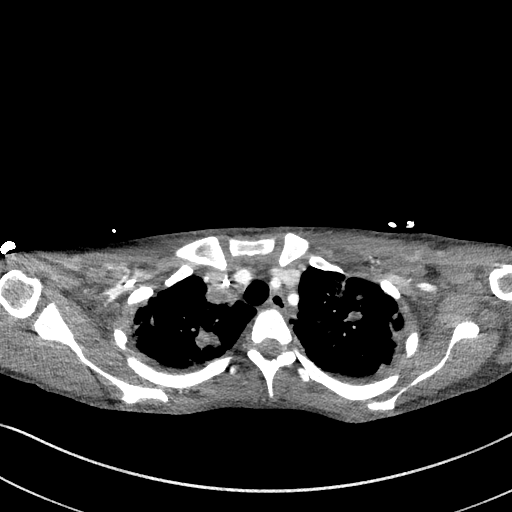
[im 97/132  lung]
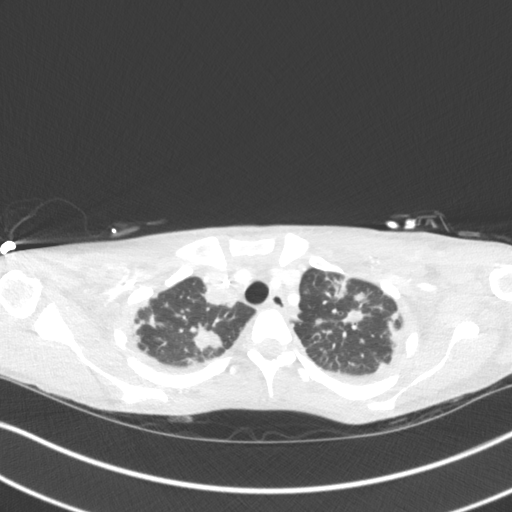
[im 111/132  lung]
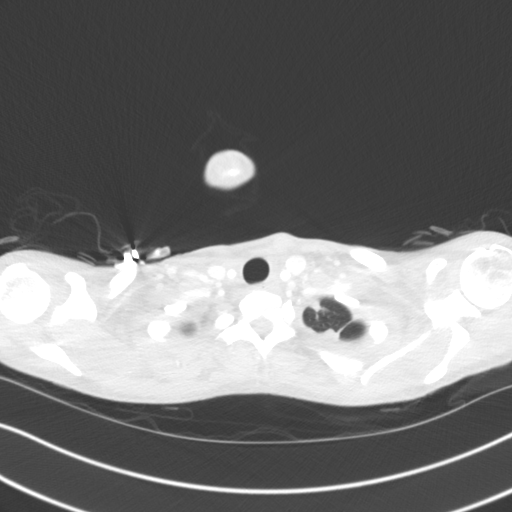
[im 125/132  lung]
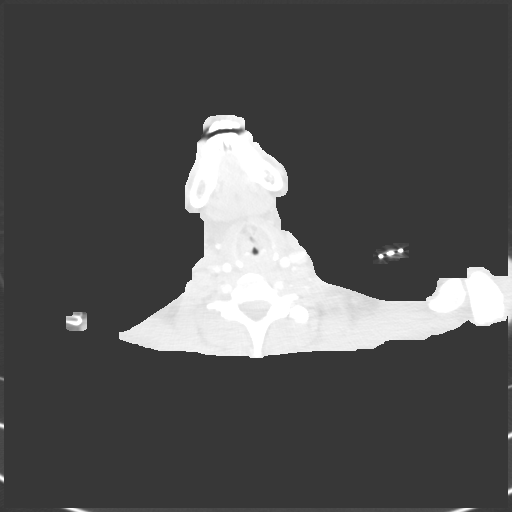

[Series 9: coronal · coronal · 0.59mm/px · 1 of 108 slices shown]
[im 54/108  lung]
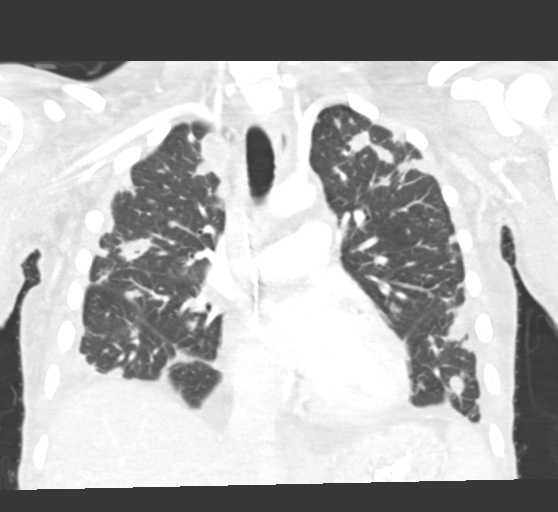

[12 of 36 positions shown; findings below may reference images not displayed]

FINDINGS: Cardiovascular: The heart is unchanged. No coronary artery
calcifications identified. The thoracic aorta is nonaneurysmal.
Streaks of artifact extend through the descending thoracic aorta.
There is no evidence of dissection. Evaluation for pulmonary emboli
is limited due to respiratory motion, particularly in the bases.
Streaks of low attenuation through the pulmonary arteries in the
bases are thought to be artifactual. No central pulmonary emboli.

Mediastinum/Nodes: Surgical clips in the neck are probably due to
previous thyroidectomy. Shotty nodes in the mediastinum are likely
reactive. Bilateral pleural effusions remain, described below. No
pericardial effusion identified. The esophagus is normal.

Lungs/Pleura: Central airways are normal. No pneumothorax. Bilateral
pulmonary nodules are identified, several of which are cavitated.
While the nodules remain numerous in number, many of the nodules are
smaller in the interval. Moderate bilateral pleural effusions are
again identified, essentially stable on the left but increased on
the right in the interval. There is a atelectasis associated with
the effusions. No new nodules, masses, or infiltrates.

Upper Abdomen: Probable small angio myelolipoma in the left kidney.
Probable small cyst in the left kidney. The upper abdomen is
otherwise unremarkable.

Musculoskeletal: Again noted is a comminuted displaced sternal
fracture, essentially stable. The visualized thoracic aorta is
unchanged. Remainder of the bones are unremarkable. In particular,
the sternoclavicular joints are preserved with no erosion. No other
sites of new bony erosion identified.
IMPRESSION: 1. Numerous pulmonary nodules throughout the lungs, many of which
demonstrate cavitation, are consistent with septic emboli given
history. Overall, there has been some mild improvement in the
nodules with several nodules measuring smaller in the interval.
2. The right-sided pleural effusion is a little larger in the
interval. Left-sided pleural effusion is essentially stable. No
convincing evidence of empyema on today's study.
3. Sternal fracture as above. Other bones are unchanged unremarkable
with no CT evidence of osteomyelitis.
4. Shotty nodes in the mediastinum are likely reactive.
5. No other acute abnormalities.

ADDENDUM:
Dr. NIZTHAR called requesting comment on any potential chest wall
abscess. Reported palpable tender 7 cm fluctuant left axillary
region mass.

There are irregular poorly defined curvilinear fluid collections
throughout the pectoralis musculature in the bilateral ventral upper
chest wall, left greater than right, with a dominant fluid pocket in
the left pectoralis muscle measuring 6.6 x 0.8 cm (series 3/image
41). There is no discrete collection within the left axilla.

These results were called by telephone at the time of addendum on
[DATE] at [DATE] to Dr. NIZTHAR, who verbally acknowledged these
results.

*** End of Addendum ***
FINDINGS: Cardiovascular: The heart is unchanged. No coronary artery
calcifications identified. The thoracic aorta is nonaneurysmal.
Streaks of artifact extend through the descending thoracic aorta.
There is no evidence of dissection. Evaluation for pulmonary emboli
is limited due to respiratory motion, particularly in the bases.
Streaks of low attenuation through the pulmonary arteries in the
bases are thought to be artifactual. No central pulmonary emboli.

Mediastinum/Nodes: Surgical clips in the neck are probably due to
previous thyroidectomy. Shotty nodes in the mediastinum are likely
reactive. Bilateral pleural effusions remain, described below. No
pericardial effusion identified. The esophagus is normal.

Lungs/Pleura: Central airways are normal. No pneumothorax. Bilateral
pulmonary nodules are identified, several of which are cavitated.
While the nodules remain numerous in number, many of the nodules are
smaller in the interval. Moderate bilateral pleural effusions are
again identified, essentially stable on the left but increased on
the right in the interval. There is a atelectasis associated with
the effusions. No new nodules, masses, or infiltrates.

Upper Abdomen: Probable small angio myelolipoma in the left kidney.
Probable small cyst in the left kidney. The upper abdomen is
otherwise unremarkable.

Musculoskeletal: Again noted is a comminuted displaced sternal
fracture, essentially stable. The visualized thoracic aorta is
unchanged. Remainder of the bones are unremarkable. In particular,
the sternoclavicular joints are preserved with no erosion. No other
sites of new bony erosion identified.
IMPRESSION: 1. Numerous pulmonary nodules throughout the lungs, many of which
demonstrate cavitation, are consistent with septic emboli given
history. Overall, there has been some mild improvement in the
nodules with several nodules measuring smaller in the interval.
2. The right-sided pleural effusion is a little larger in the
interval. Left-sided pleural effusion is essentially stable. No
convincing evidence of empyema on today's study.
3. Sternal fracture as above. Other bones are unchanged unremarkable
with no CT evidence of osteomyelitis.
4. Shotty nodes in the mediastinum are likely reactive.
5. No other acute abnormalities.

## 2019-07-02 MED ORDER — OXYCODONE HCL 5 MG PO TABS
5.0000 mg | ORAL_TABLET | ORAL | Status: DC | PRN
  Administered 2019-07-02 – 2019-07-03 (×7): 10 mg via ORAL
  Filled 2019-07-02 (×7): qty 2

## 2019-07-02 MED ORDER — RAMELTEON 8 MG PO TABS
8.0000 mg | ORAL_TABLET | Freq: Every day | ORAL | Status: AC
  Administered 2019-07-02 – 2019-07-08 (×7): 8 mg via ORAL
  Filled 2019-07-02 (×7): qty 1

## 2019-07-02 MED ORDER — IOHEXOL 300 MG/ML  SOLN
75.0000 mL | Freq: Once | INTRAMUSCULAR | Status: AC | PRN
  Administered 2019-07-02: 75 mL via INTRAVENOUS

## 2019-07-02 NOTE — Progress Notes (Signed)
Subjective: Ms. Coppess is unchanged. She reports consistent pain and discomfort that prevents her from turning, sitting up and laying on her side. She reports chest pain and bilateral axillary pain. She did sleep better last night after receiving ramelteon. We discussed her hx of anemia in more detail today. She reports irregular periods when she first began menstruating and was put on either iron or birth control to help and once her periods regulated, she has had no other bleeding issues since then. She states she was not given a bedside bath yesterday and would like one. She has no other complaints or concerns at this time.   Objective:  Vital signs in last 24 hours: Vitals:   07/01/19 1940 07/01/19 2308 07/02/19 0221 07/02/19 0310  BP: 114/80 118/76  106/75  Pulse: (!) 103     Resp: 16 20  18   Temp: 98.9 F (37.2 C) 98.6 F (37 C)  98.2 F (36.8 C)  TempSrc: Oral Oral  Oral  SpO2: 100% 98%  98%  Weight:   48.8 kg   Height:       Physical Exam  Constitutional:  Laying still in bed, in too much discomfort to move  Cardiovascular: Normal rate and regular rhythm.  No murmur heard. No edema in lower extremities  Pulmonary/Chest: Effort normal. No respiratory distress.  Chest wall incredibly tender to touch; hard to appreciate breath sounds as patient could not tolerate stethoscope on her chest and could not move from her back but lateral chest wall breath sounds were clear  Abdominal: Soft. She exhibits no distension.  Musculoskeletal:     Comments: 7 x 7 cm swelling in left axilla that is warm and TTP  Skin: Skin is warm and dry.  Nursing note and vitals reviewed.   Assessment/Plan:  Principal Problem:   MRSA bacteremia -pt undergoing treatment with 6 months of vanc for MRSA bacteremia with septic emboli to lungs, paraspinal muscles and ilium  Anemia -pt Hgb stable today above 8 -we discussed in more detail her anemia as a teenager which she reports was limited to when  she first began menstruating and had an irregular period but once her period became regular she has had no problems since; she was treated at that time with either iron or a birth control pill she cannot remember -could be anemia of chronic disease  -patient still denies bleeding -we will follow CBC, transfuse if below 7 and are obtaining hemolysis labs to get a better understanding of the etiology of her anemia   Axillary and chest wall pain -patient reports pain in her bilateral axilla and anterior chest wall so severe she cannot move -she continues to have a swelling in her left axilla that is warm and tender to palpation; it feels tighter today so no fluctuance identified -CT of her LUE yesterday demonstrated an abscess that was tracking throughout her pectoralis muscle almost to her humerus -we have ordered another CT chest as it appears the abscess there is growing -we have consulted General surgery to see if an I&D is possible for this abscess - they are waiting on CT chest results and will determine if surgery appropriate or IR  Insomnia -pt sleep improved with Ramelteon which has been reordered    Active Problems:   Opioid overdose ([place])   Septic pulmonary embolism, bilateral    Suspected endocarditis   Active intravenous drug use   Pericardial effusion   Rash/skin eruption  Dispo: Anticipated discharge in approximately 5  weeks.   Al Decant, MD 07/02/2019, 6:53 AM Pager: [phone]
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
## 2019-07-02 NOTE — Progress Notes (Signed)
  Date: 07/02/2019  Patient name: Monica Stephens  Medical record number: [id number]  Date of birth: 1981-11-07   I have seen and evaluated this patient and I have discussed the plan of care with the house staff. Please see their note for complete details. I concur with their findings with the following additions/corrections: [name] was seen this morning on team rounds.  She will need 6 weeks of vancomycin not 6 months which was a dictation error on Dr. Webb Silversmith note. She continues to have left upper arm, axilla, and left upper chest pain.  It makes it difficult for her to move.  We discussed her case with another health care provider (radiology) who pointed out the abscess in the pectoralis muscle and estimated it was about 4 cm.  Surgery has been consulted and it has not been decided whether she will have a needle aspiration or open I&D.  We will await their final recommendations.  Due to the chest pain and findings on the CT scan, the radiologist was concerned she has sternoclavicular osteomyelitis and recommended a CT scan of the chest.  The CT chest is still pending.  Billing notes : She has a new problem today which is possible sternoclavicular osteomyelitis which is being worked up with a CT scan.  Her MRSA bacteremia, anemia, and pectoralis abscess are stable.  We are ordering lab test, a chest CT scan, and we discussed her upper extremity CT scan with another healthcare provider.  Bartholomew Crews, MD 07/02/2019, 3:13 PM

## 2019-07-03 ENCOUNTER — Inpatient Hospital Stay (HOSPITAL_COMMUNITY): Payer: Self-pay

## 2019-07-03 DIAGNOSIS — R300 Dysuria: Secondary | ICD-10-CM

## 2019-07-03 LAB — COMPREHENSIVE METABOLIC PANEL
ALT: 34 U/L (ref 0–44)
AST: 35 U/L (ref 15–41)
Albumin: 1.6 g/dL — ABNORMAL LOW (ref 3.5–5.0)
Alkaline Phosphatase: 92 U/L (ref 38–126)
Anion gap: 7 (ref 5–15)
BUN: 18 mg/dL (ref 6–20)
CO2: 28 mmol/L (ref 22–32)
Calcium: 7.7 mg/dL — ABNORMAL LOW (ref 8.9–10.3)
Chloride: 103 mmol/L (ref 98–111)
Creatinine, Ser: 0.62 mg/dL (ref 0.44–1.00)
GFR calc Af Amer: >60 mL/min (ref >60)
GFR calc non Af Amer: >60 mL/min (ref >60)
Glucose, Bld: 96 mg/dL (ref 70–99)
Potassium: 3.9 mmol/L (ref 3.5–5.1)
Sodium: 138 mmol/L (ref 135–145)
Total Bilirubin: 0.4 mg/dL (ref 0.3–1.2)
Total Protein: 5.2 g/dL — ABNORMAL LOW (ref 6.5–8.1)

## 2019-07-03 LAB — CBC
HCT: 26.2 % — ABNORMAL LOW (ref 36.0–46.0)
Hemoglobin: 8.2 g/dL — ABNORMAL LOW (ref 12.0–15.0)
MCH: 30.4 pg (ref 26.0–34.0)
MCHC: 31.3 g/dL (ref 30.0–36.0)
MCV: 97 fL (ref 80.0–100.0)
Platelets: 532 10*3/uL — ABNORMAL HIGH (ref 150–400)
RBC: 2.7 MIL/uL — ABNORMAL LOW (ref 3.87–5.11)
RDW: 18.6 % — ABNORMAL HIGH (ref 11.5–15.5)
WBC: 14.9 10*3/uL — ABNORMAL HIGH (ref 4.0–10.5)
nRBC: 0.1 % (ref 0.0–0.2)

## 2019-07-03 LAB — URINALYSIS, ROUTINE W REFLEX MICROSCOPIC
Bilirubin Urine: NEGATIVE
Glucose, UA: NEGATIVE mg/dL
Hgb urine dipstick: NEGATIVE
Ketones, ur: NEGATIVE mg/dL
Nitrite: NEGATIVE
Protein, ur: NEGATIVE mg/dL
Specific Gravity, Urine: 1.024 (ref 1.005–1.030)
pH: 6 (ref 5.0–8.0)

## 2019-07-03 LAB — LACTATE DEHYDROGENASE: LDH: 170 U/L (ref 98–192)

## 2019-07-03 LAB — RETICULOCYTES
Immature Retic Fract: 28 % — ABNORMAL HIGH (ref 2.3–15.9)
RBC.: 2.7 MIL/uL — ABNORMAL LOW (ref 3.87–5.11)
Retic Count, Absolute: 126.1 10*3/uL (ref 19.0–186.0)
Retic Ct Pct: 4.7 % — ABNORMAL HIGH (ref 0.4–3.1)

## 2019-07-03 LAB — VANCOMYCIN, TROUGH: Vancomycin Tr: 5 ug/mL — ABNORMAL LOW (ref 15–20)

## 2019-07-03 LAB — SAVE SMEAR(SSMR), FOR PROVIDER SLIDE REVIEW

## 2019-07-03 LAB — VANCOMYCIN, PEAK: Vancomycin Pk: 31 ug/mL (ref 30–40)

## 2019-07-03 IMAGING — US IR ULTRASOUND GUIDED ASPIRATION/DRAINAGE
1 series · 12 of 12 positions shown · non-contrast
Comparison: none

INDICATION: 37-year-old female with left chest wall abscess. She presents for
ultrasound-guided aspiration.

[Series 1: ir ultrasound guided aspiration/drainage · 12 acquisitions, 12 frames shown]
[im 1/12]
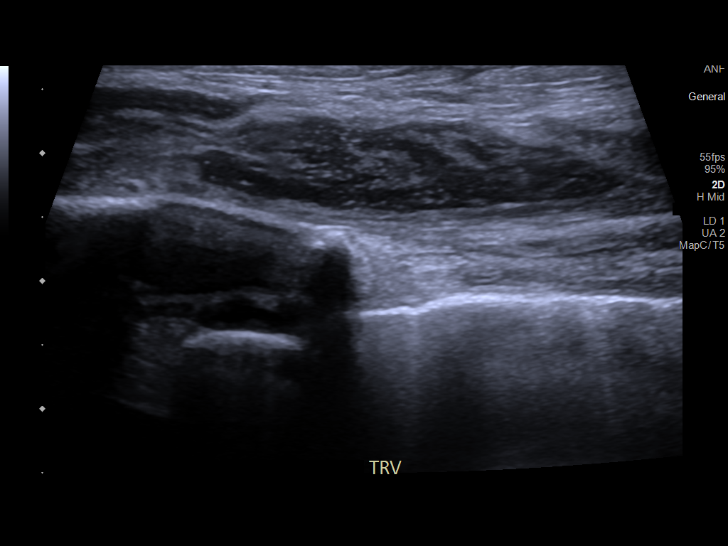
[im 2/12]
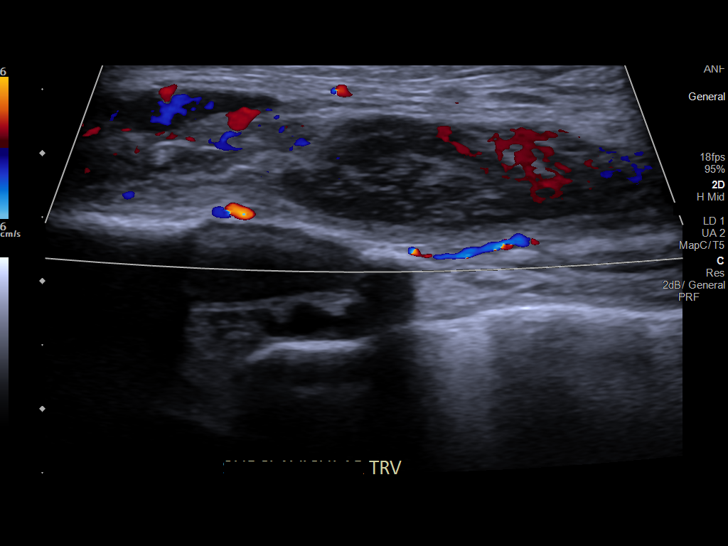
[im 3/12]
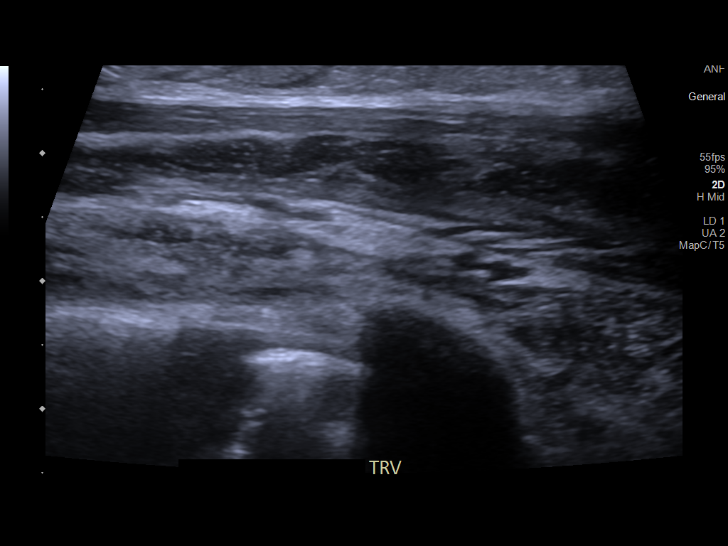
[im 4/12]
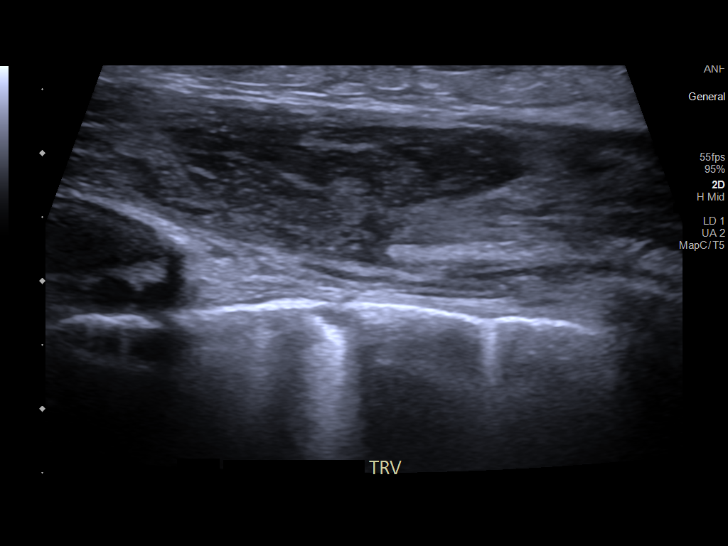
[im 5/12]
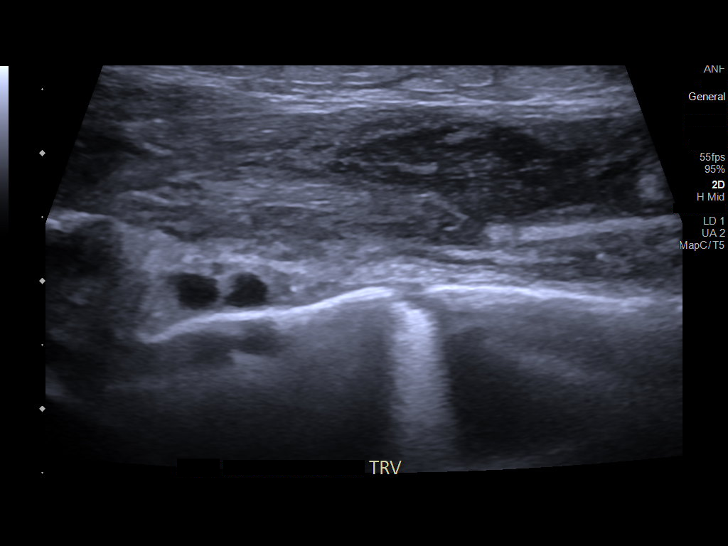
[im 6/12]
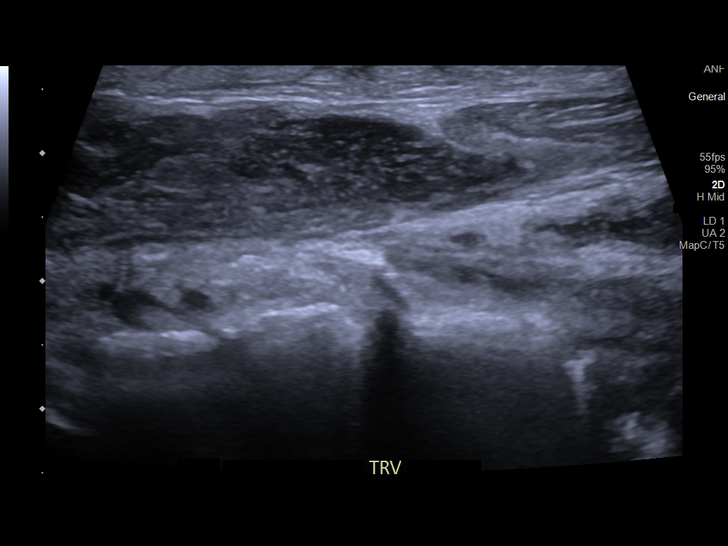
[im 7/12]
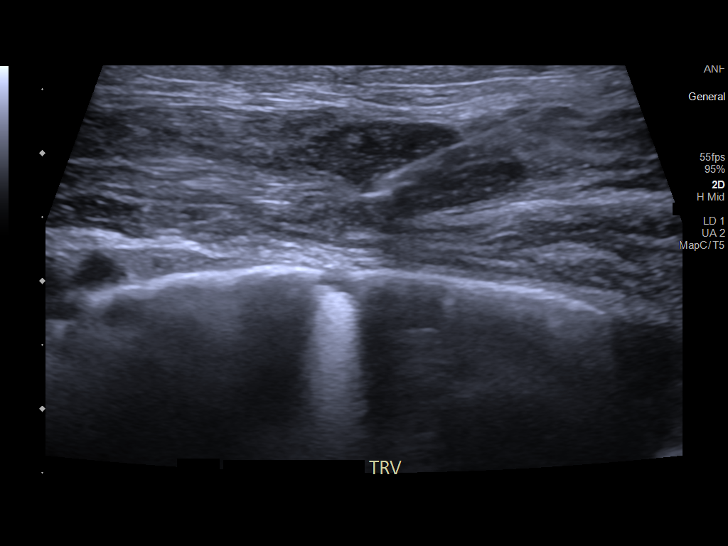
[im 8/12]
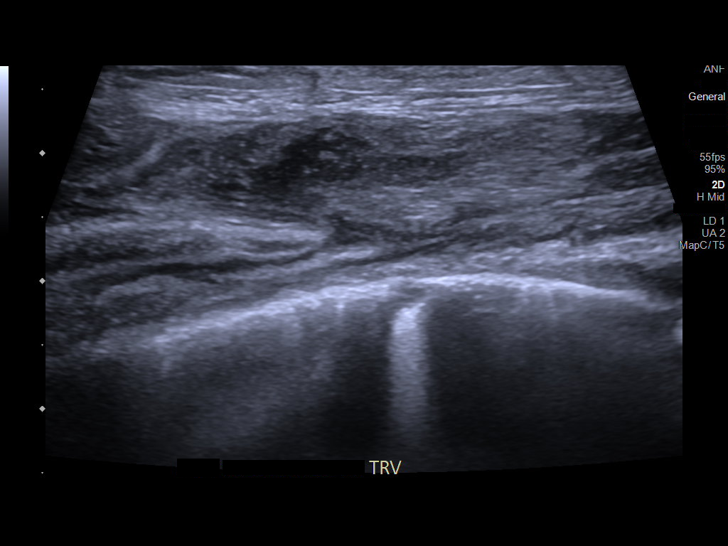
[im 9/12]
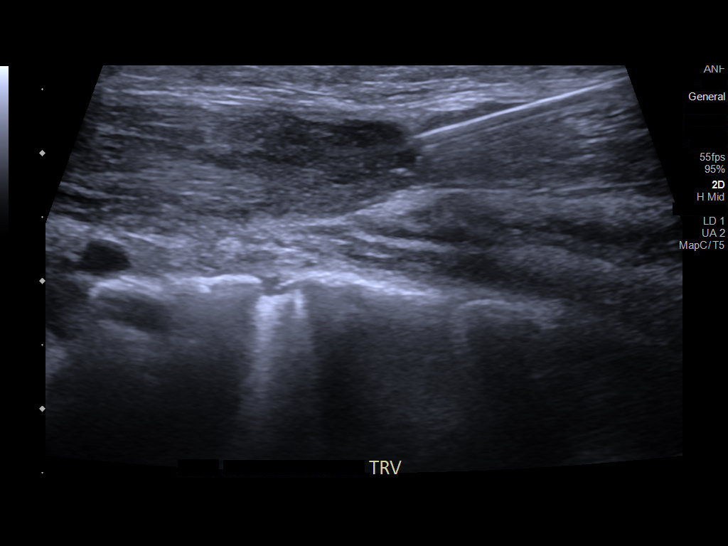
[im 10/12]
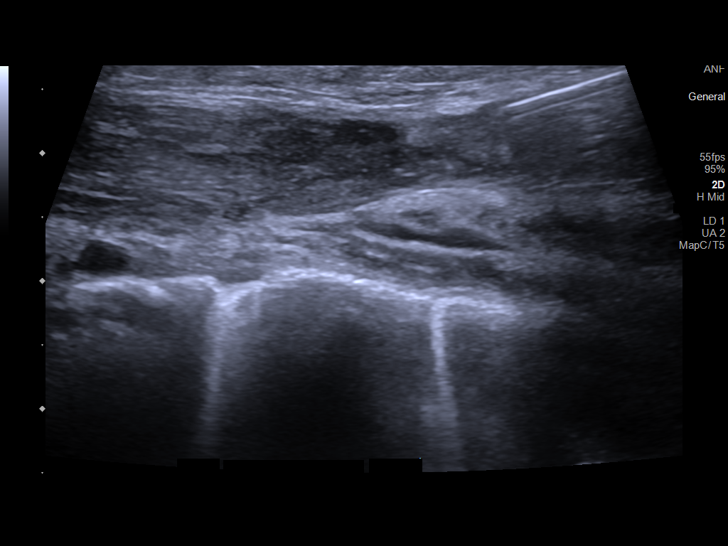
[im 11/12]
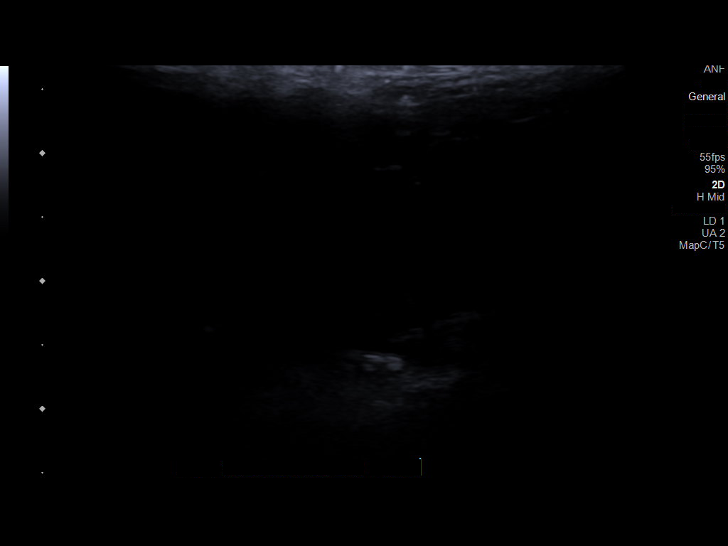
[im 12/12]
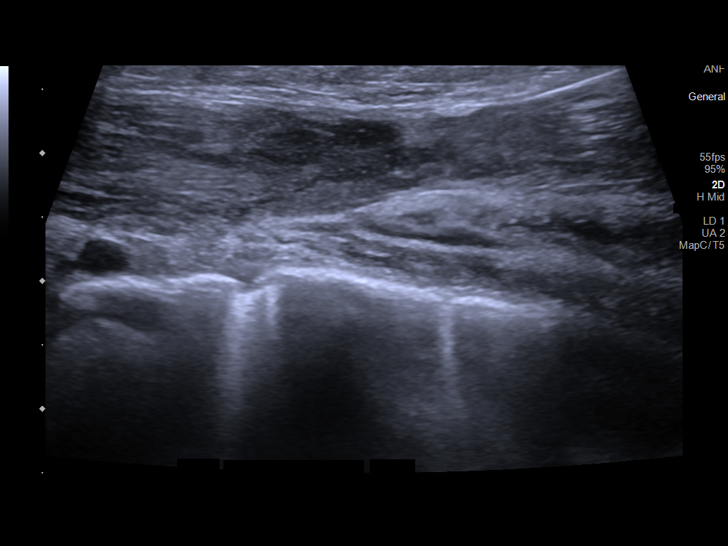

[12 of 12 positions shown; findings below may reference images not displayed]

EXAM:
Ultrasound aspiration

MEDICATIONS:
The patient is currently admitted to the hospital and receiving
intravenous antibiotics. The antibiotics were administered within an
appropriate time frame prior to the initiation of the procedure.

ANESTHESIA/SEDATION:
None

COMPLICATIONS:
None immediate.

PROCEDURE:
Informed written consent was obtained from the patient after a
thorough discussion of the procedural risks, benefits and
alternatives. All questions were addressed. A timeout was performed
prior to the initiation of the procedure.

Ultrasound was used to interrogate the left chest wall. An
ill-defined complex fluid collection is identified in the
subpectoral space. The overlying skin was sterilely prepped and
draped in the standard sterile fashion. Local anesthesia was
attained by infiltration with 1% lidocaine. A small dermatotomy was
made. Under real-time sonographic guidance, an 18 gauge trocar
needle was advanced into the complex fluid collection. Aspiration
was performed yielding approximately 3 mL purulent fluid. The fluid
was sent for Gram stain and culture. The patient tolerated the
procedure well.
IMPRESSION: Successful aspiration of complex fluid collection in the left
subpectoral space yielding 3 mL purulent fluid. Sample sent for
culture.

## 2019-07-03 MED ORDER — GABAPENTIN 600 MG PO TABS
300.0000 mg | ORAL_TABLET | Freq: Two times a day (BID) | ORAL | Status: DC
  Administered 2019-07-03: 300 mg via ORAL
  Filled 2019-07-03 (×2): qty 1

## 2019-07-03 MED ORDER — ACETAMINOPHEN 325 MG PO TABS
650.0000 mg | ORAL_TABLET | Freq: Four times a day (QID) | ORAL | Status: DC
  Administered 2019-07-03 – 2019-07-31 (×92): 650 mg via ORAL
  Filled 2019-07-03 (×93): qty 2

## 2019-07-03 MED ORDER — KETOROLAC TROMETHAMINE 30 MG/ML IJ SOLN
30.0000 mg | Freq: Three times a day (TID) | INTRAMUSCULAR | Status: DC
  Administered 2019-07-03 – 2019-07-04 (×2): 30 mg via INTRAVENOUS
  Filled 2019-07-03 (×2): qty 1

## 2019-07-03 MED ORDER — KETOROLAC TROMETHAMINE 30 MG/ML IJ SOLN
30.0000 mg | Freq: Three times a day (TID) | INTRAMUSCULAR | Status: DC | PRN
  Administered 2019-07-03: 30 mg via INTRAVENOUS
  Filled 2019-07-03: qty 1

## 2019-07-03 MED ORDER — VANCOMYCIN HCL 500 MG IV SOLR
500.0000 mg | INTRAVENOUS | Status: AC
  Administered 2019-07-03: 500 mg via INTRAVENOUS
  Filled 2019-07-03: qty 500

## 2019-07-03 MED ORDER — LIDOCAINE HCL (PF) 1 % IJ SOLN
INTRAMUSCULAR | Status: AC
  Filled 2019-07-03: qty 30
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
## 2019-07-03 NOTE — Progress Notes (Signed)
Patient ID: Monica Stephens, female   DOB: 1981/12/10, 38 y.o.   MRN: [id number]    16 Days Post-Op  Subjective: Patient still complains of pain in her chest and down her left arm particularly.  Objective: Vital signs in last 24 hours: Temp:  [98 F (36.7 C)-99.1 F (37.3 C)] 98.6 F (37 C) (07/03 0826) Pulse Rate:  [89-104] 89 (07/03 0826) Resp:  [18-23] 18 (07/03 0826) BP: (108-114)/(72-78) 110/76 (07/03 0826) SpO2:  [94 %-100 %] 94 % (07/03 0826) Weight:  [48.6 kg] 48.6 kg (07/03 0500) Last BM Date: 07/01/19  Intake/Output from previous day: 07/02 0701 - 07/03 0700 In: 120 [P.O.:120] Out: -  Intake/Output this shift: No intake/output data recorded.  PE: Chest: healing vesicular lesions on her anterior chest wall with some skin thickening changing noted.  Some tenderness out laterally towards her shoulder however no erythema or cellulitis is noted.  No fluctuant areas noted.  Soft lesion type area on the medial aspect of the proximal left arm near her axilla.  Unclear what this is.  Lab Results:  Recent Labs    07/02/19 0302 07/03/19 0209  WBC 15.7* 14.9*  HGB 8.4* 8.2*  HCT 26.4* 26.2*  PLT 546* 532*   BMET Recent Labs    07/02/19 0302 07/03/19 0209  NA 136 138  K 3.5 3.9  CL 101 103  CO2 27 28  GLUCOSE 101* 96  BUN 14 18  CREATININE 0.56 0.62  CALCIUM 7.9* 7.7*   PT/INR No results for input(s): LABPROT, INR in the last 72 hours. CMP     Component Value Date/Time   NA 138 07/03/2019 0209   NA 137 06/25/2012   K 3.9 07/03/2019 0209   K 4.3 06/25/2012   CL 103 07/03/2019 0209   CL 103 06/25/2012   CO2 28 07/03/2019 0209   CO2 28 06/25/2012   GLUCOSE 96 07/03/2019 0209   BUN 18 07/03/2019 0209   BUN 13 06/25/2012   BUN 13 06/24/2012   CREATININE 0.62 07/03/2019 0209   CREATININE 1.04 06/25/2012   CALCIUM 7.7 (L) 07/03/2019 0209   CALCIUM 9.6 06/25/2012   PROT 5.2 (L) 07/03/2019 0209   PROT 7.3 07/24/2011   PROT 7.3 07/24/2011   ALBUMIN 1.6  (L) 07/03/2019 0209   ALBUMIN 4.7 07/24/2011   ALBUMIN 4.7 07/24/2011   AST 35 07/03/2019 0209   AST 13 07/24/2011   AST 13 07/24/2011   ALT 34 07/03/2019 0209   ALKPHOS 92 07/03/2019 0209   BILITOT 0.4 07/03/2019 0209   BILITOT 0.5 07/24/2011   BILITOT 0.5 07/24/2011   GFRNONAA >60 07/03/2019 0209   GFRNONAA 62 06/25/2012   GFRNONAA 75 06/25/2012   GFRAA >60 07/03/2019 0209   GFRAA 75 06/24/2012   Lipase     Component Value Date/Time   LIPASE 29 04/08/2016 1132       Studies/Results: Ct Chest W Contrast  Result Date: 07/02/2019 CLINICAL DATA:  Bacteremia.  Enlarging abscesses2. EXAM: CT CHEST WITH CONTRAST TECHNIQUE: Multidetector CT imaging of the chest was performed during intravenous contrast administration. CONTRAST:  39mL OMNIPAQUE IOHEXOL 300 MG/ML  SOLN COMPARISON:  June 22, 2019 FINDINGS: Cardiovascular: The heart is unchanged. No coronary artery calcifications identified. The thoracic aorta is nonaneurysmal. Streaks of artifact extend through the descending thoracic aorta. There is no evidence of dissection. Evaluation for pulmonary emboli is limited due to respiratory motion, particularly in the bases. Streaks of low attenuation through the pulmonary arteries in the bases are thought  to be artifactual. No central pulmonary emboli. Mediastinum/Nodes: Surgical clips in the neck are probably due to previous thyroidectomy. Shotty nodes in the mediastinum are likely reactive. Bilateral pleural effusions remain, described below. No pericardial effusion identified. The esophagus is normal. Lungs/Pleura: Central airways are normal. No pneumothorax. Bilateral pulmonary nodules are identified, several of which are cavitated. While the nodules remain numerous in number, many of the nodules are smaller in the interval. Moderate bilateral pleural effusions are again identified, essentially stable on the left but increased on the right in the interval. There is a atelectasis associated with  the effusions. No new nodules, masses, or infiltrates. 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 Probable small cyst in the left kidney. The upper abdomen is otherwise unremarkable. Musculoskeletal: Again noted is a comminuted displaced sternal fracture, essentially stable. The visualized thoracic aorta is unchanged. Remainder of the bones are unremarkable. In particular, the sternoclavicular joints are preserved with no erosion. No other sites of new bony erosion identified. IMPRESSION: 1. Numerous pulmonary nodules throughout the lungs, many of which demonstrate cavitation, are consistent with septic emboli given history. Overall, there has been some mild improvement in the nodules with several nodules measuring smaller in the interval. 2. The right-sided pleural effusion is a little larger in the interval. Left-sided pleural effusion is essentially stable. No convincing evidence of empyema on today's study. 3. Sternal fracture as above. Other bones are unchanged unremarkable with no CT evidence of osteomyelitis. 4. Shotty nodes in the mediastinum are likely reactive. 5. No other acute abnormalities. Electronically Signed   By: Dorise Bullion III M.D   On: 07/02/2019 17:35   [place] Lt Upper Extrem Ltd Soft Tissue Non Vascular  Result Date: 07/01/2019 CLINICAL DATA:  Left arm swelling and erythema for 4 weeks. MRSA bacteremia with leukocytosis. Question upper arm abscess. EXAM: ULTRASOUND LEFT UPPER EXTREMITY LIMITED TECHNIQUE: Ultrasound examination of the upper extremity soft tissues was performed in the area of clinical concern. COMPARISON:  None. FINDINGS: In the area of concern, there is a large ill-defined area of decreased echogenicity interdigitating with the subcutaneous fat. This measures approximately 4.8 x 1.3 x 3.3 cm. There is no associated internal blood flow. There is no well-defined fluid collection to suggest an abscess. No foreign bodies are identified. IMPRESSION: There  is an ill-defined area of decreased echogenicity in the subcutaneous fat of the left upper arm which could reflect a phlegmon or infiltrating edema. No well-defined abscess demonstrated at this time. Consider further evaluation with CT or MRI. Electronically Signed   By: Richardean Sale M.D.   On: 07/01/2019 12:34   Ct Extrem Up Entire Arm L W/cm  Result Date: 07/01/2019 CLINICAL DATA:  Pain and swelling in the left upper extremity near the axilla, concerning for abscess. EXAM: CT OF THE UPPER LEFT EXTREMITY WITH CONTRAST TECHNIQUE: Multidetector CT imaging of the upper left extremity was performed according to the standard protocol following intravenous contrast administration. COMPARISON:  Extremity ultrasound of 07/01/2019 CONTRAST:  162mL OMNIPAQUE IOHEXOL 300 MG/ML  SOLN FINDINGS: Bones/Joint/Cartilage No bony destructive findings to suggest osteomyelitis. Ligaments Suboptimally assessed by CT. Muscles and Tendons We partially image fluid collections tracking along left pectoralis musculature, as were shown on the chest CT of 06/22/2019. Abscesses along the pectoralis major and minor are a distinct possibility, and may be extending down in the breast and chest wall on the left side. These fluid collections extend towards the humeral insertion of the pectoralis muscle. [name] arm is  by her side and accordingly we include some of the left-side of the body and upper thigh. Included in this there appear to be small abscesses in the left biceps femoris muscle and lateral portion of the left adductor magnus for example on images 330-354. Soft tissues Diffuse subcutaneous edema in the left arm and along the body wall compatible with third spacing of fluid. Scattered rounded airspace opacities in the lungs probably from septic emboli. Left pleural effusion. IMPRESSION: 1. Abscesses tracking along the left pectoralis musculature, and along the left upper chest wall. Pectoralis abscess tracks along the distal  pectoralis muscle as it approaches the humerus. No osteomyelitis identified. 2. Incidental inclusion of parts of the left lower extremity indicate abscesses involving the left biceps femoris muscle and lateral portion of the left adductor magnus muscle. 3. Scattered pulmonary nodules probably from septic emboli, as suggested on prior CT scan. Moderate left pleural effusion. 4. Generalized subcutaneous edema compatible with third spacing of fluid. 5. For this exam, imaging was obtained from the shoulder down to the fingers. In the future, this will require 3 separate musculoskeletal CT exams, a CT of the humerus, a CT of the forearm, and a CT of the hand. There is no one exam or CPT code that encompasses a CT exam of the entire upper extremity, and if multiple different regions need to be scanned, then they need to be requested individually. Electronically Signed   By: Van Clines M.D.   On: 07/01/2019 21:18    Anti-infectives: Anti-infectives (From admission, onward)   Start     Dose/Rate Route Frequency Ordered Stop   06/20/19 1000  vancomycin (VANCOCIN) IVPB 1000 mg/200 mL premix     1,000 mg 200 mL/hr over 60 Minutes Intravenous Every 24 hours 06/19/19 2333 07/31/19 2359   06/17/19 1130  vancomycin (VANCOCIN) 500 mg in sodium chloride 0.9 % 100 mL IVPB  Status:  Discontinued     500 mg 100 mL/hr over 60 Minutes Intravenous Every 12 hours 06/17/19 0929 06/19/19 2333   06/15/19 1200  vancomycin (VANCOCIN) IVPB 750 mg/150 ml premix  Status:  Discontinued     750 mg 150 mL/hr over 60 Minutes Intravenous Every 24 hours 06/15/19 1112 06/17/19 0929   06/15/19 0151  vancomycin variable dose per unstable renal function (pharmacist dosing)  Status:  Discontinued      Does not apply See admin instructions 06/15/19 0151 06/15/19 1112   06/14/19 1130  acyclovir (ZOVIRAX) 475 mg in dextrose 5 % 100 mL IVPB     10 mg/kg  47.6 kg 109.5 mL/hr over 60 Minutes Intravenous  Once 06/14/19 1052 06/14/19 1414    06/14/19 1100  ceFEPIme (MAXIPIME) 2 g in sodium chloride 0.9 % 100 mL IVPB     2 g 200 mL/hr over 30 Minutes Intravenous  Once 06/14/19 1048 06/14/19 1308   06/14/19 1100  metroNIDAZOLE (FLAGYL) IVPB 500 mg  Status:  Discontinued     500 mg 100 mL/hr over 60 Minutes Intravenous  Once 06/14/19 1048 06/15/19 1054   06/14/19 1100  vancomycin (VANCOCIN) IVPB 1000 mg/200 mL premix     1,000 mg 200 mL/hr over 60 Minutes Intravenous  Once 06/14/19 1048 06/14/19 1327       Assessment/Plan Principal Problem:   MRSA bacteremia Active Problems:   Opioid overdose ([place])   Septic pulmonary embolism, bilateral    Suspected endocarditis   Active intravenous drug use   Pericardial effusion   Rash/skin eruption  L axillary abscess tracking  along pectoral muscle - CT chest does not show any evidence of abscess or other infectious process except for things already known such as septic emboli etc. -unclear of anything to drain or aspirate at this time. -could consider further evaluation of shoulder joint pain and area on her arm by ortho if felt indicated by primary team. -cont abx for now -may have a diet -no surgical intervention  FEN - NPO, but may have a regular diet from our standpoint VTE - Lovenox ID - vanc   LOS: 19 days    Henreitta Cea , [facility] 07/03/2019, 9:23 AM Pager: [phone]

## 2019-07-03 NOTE — Progress Notes (Signed)
Subjective: Ms. Leeb is doing much better today. She had additional pain medications prescribed which helped her pain level. She has been sleeping well with the addition of Ramelteon. She reports some dysuria but denies hematuria and frequency. She is able to move better in the bed today but still reports some discomfort with that. She has questions about her possible surgery for her abscess and she requested that we talk with her mother about our plans which we have done.   Objective:  Vital signs in last 24 hours: Vitals:   07/02/19 2047 07/02/19 2310 07/03/19 0500 07/03/19 0605  BP: 114/73 113/72  109/78  Pulse: (!) 104     Resp: (!) 23 (!) 23  19  Temp: 98.4 F (36.9 C) 99.1 F (37.3 C)  98.4 F (36.9 C)  TempSrc: Oral Oral  Oral  SpO2: 94% 100%  98%  Weight:   48.6 kg   Height:       Physical Exam  Constitutional: No distress.  HENT:  Head: Normocephalic.  Cardiovascular: Normal rate, regular rhythm and normal heart sounds.  Pulmonary/Chest: Effort normal. No respiratory distress.  Chest wall tender to palpation   Abdominal: Bowel sounds are normal. She exhibits no distension.  Musculoskeletal:     Comments: Left axillary swelling unchanged from prior days, still warm to touch with fluctuance, tender to palpation, no erythema  Neurological: She is alert.  Skin: Skin is warm and dry.    Labs: WBC 14.9 Hgb 8.2 T Bili 0.4 LDH 170 Reticulocyte Ct Pct: 4.7  Imaging:   CT Chest W: mild improvement in pulmonary nodules; right sided pleural effusion larger and left sided stable; no empyema; stable comminuted displaced sternal fx without osteomyelitis; multiple thin layers of abscesses across the chest wall musculature and throughout the pectoralis muscle  Assessment/Plan:  Principal Problem:   MRSA bacteremia/abscesses -receiving IV vancomycin for 6 weeks for MRSA bacteremia with septic emboli to lungs, paraspinal muscles and ilium -WBC downtrending to 14.9 today  -pt continues to report pain in her bilateral axilla and chest wall, less severe to days before due to additional pain medication but still quite tender to palpation -CT Chest demonstrated multiple thin layers of abscesses across the chest wall musculature and throughout the pectoralis muscle  -Discussed CT findings with radiologist Dr. Polly Cobia who described the irregular, poorly defined curvilinear fluid collections throughout the pectoralis musculature in the bilateral ventral upper chest wall, left greater than right, with a dominant fluid pocket in the left pectoralis muscle measuring 6.6 x 0.8 cm with no discrete collection visible within the left axilla -General surgery was consulted and has suggested consulting IR which we have done  -General surgery had also prescribed Roxicodone for additional pain control which we replaced with Toradol given history of IVDU   Active Problems:   Anemia -pt Hgb stable at 8.2, was 8.4 yesterday -Has elevated reticulocyte count percentage but otherwise normal hemolysis labs -considered anemia of chronic disease given elevated ferritin and low TIBC but given elevated reticulocyte count this is less likely -unlikely iron deficiency anemia given normal iron levels and high iron saturation  -had previously considered vitamin deficiency but B12 was elevated -pt has a history of hypothyroidism 2/2 thyroidectomy in 2008 and upon admission had a markedly elevated TSH so it is possible her anemia is a result of her hypothyroidism; will consider recheck of TSH at hospital follow up -anemia likely 2/2 to a combination of the above    Dysuria -pt reports new  onset dysuria but denies hematuria and frequency -urinalysis ordered and will fu  Dispo: Anticipated discharge in approximately 5 weeks after completion of IV antibiotics for MRSA bacteremia and presumed   Al Decant, MD 07/03/2019, 6:46 AM Pager: [phone]

## 2019-07-03 NOTE — Procedures (Signed)
Interventional Radiology Procedure Note  Procedure: US guided aspiration of left chest subpectoral abscess yielding 3 mL purulent fluid.   Complications: None  Estimated Blood Loss: None  Recommendations: - Sent for culture  Signed,  Criselda Peaches, MD

## 2019-07-03 NOTE — Progress Notes (Signed)
Pharmacy Antibiotic Note  Monica Stephens is a 38 y.o. female admitted on 06/14/2019 now with bacteremia and endocarditis .  Pharmacy has been consulted for Vancomycin dosing.  ID: Vanc  for MRSA bacteremia and presumed right sided endocarditis. Noted patient has septic emboli on CT chest and possible pericarditis - TEE negative for endocarditis. MRI of spine w/ septic emboli and paraspinal abscesses.  -WBC downtrending 15.7>>14.9, Tm 99.1>afeb, LA 1.8>1.1 Scr stable  -CT chest (7/2) mild improvement in septic emboli  6/14 Vanc>> (minimum 6 weeks per ID ) 6/14 Cefepime X 1 6/14 Acyclovir X 1   6/19 VP/VT 26/13, AUC = 457 on 500mg  q12 >> 1g Q24 for convenience outpt 6/26 VP/VT 27/10, AUC = 458  7/3 Vanco peak /Vanco Trough  31/5: AUC=366.9, increase to 1250mg /24 (next expected AUC 458)   6/14 Bcx x 4/4: staph aureus (BCID MRSA) 6/14 COVID: neg  6/14 Ucx: MRSA 6/16 Wound: MRSA 6/17 Bcx: MRSA 6/20 Bcx: NGF 6/24 Pleual fluid: NGF 6/24 Pleural fluid fungus: NGTD 6/24 COVID: neg  6/29: Bcx: NGF   Plan: - Give Vancomycin extra 500mg  IV x 1 today. Called RN. F/u charting - Increase Vancomycin to 1250mg  IV q 24h starting 7/4 - consider weekly levels (next 7/10)    Height: 5\' 2"  (157.5 cm) Weight: 107 lb 2.3 oz (48.6 kg) IBW/kg (Calculated) : 50.1  Temp (24hrs), Avg:98.5 F (36.9 C), Min:97.8 F (36.6 C), Max:99.1 F (37.3 C)  Recent Labs  Lab 06/29/19 0507 06/30/19 0500 07/01/19 0307 07/02/19 0302 07/03/19 0209 07/03/19 1006 07/03/19 1353  WBC 17.8* 16.0* 18.4* 15.7* 14.9*  --   --   CREATININE 0.62 0.55 0.61 0.56 0.62  --   --   VANCOTROUGH  --   --   --   --   --  5*  --   VANCOPEAK  --   --   --   --   --   --  31    Estimated Creatinine Clearance: 74.6 mL/min (by C-G formula based on SCr of 0.62 mg/dL).    No Known Allergies   Edder Bellanca S. Alford Highland, PharmD, [place] Clinical Staff Pharmacist Eilene Ghazi [place] 07/03/2019 3:23 PM

## 2019-07-04 DIAGNOSIS — I76 Septic arterial embolism: Secondary | ICD-10-CM

## 2019-07-04 LAB — BASIC METABOLIC PANEL
Anion gap: 10 (ref 5–15)
BUN: 29 mg/dL — ABNORMAL HIGH (ref 6–20)
CO2: 26 mmol/L (ref 22–32)
Calcium: 8.1 mg/dL — ABNORMAL LOW (ref 8.9–10.3)
Chloride: 102 mmol/L (ref 98–111)
Creatinine, Ser: 0.55 mg/dL (ref 0.44–1.00)
GFR calc Af Amer: >60 mL/min (ref >60)
GFR calc non Af Amer: >60 mL/min (ref >60)
Glucose, Bld: 116 mg/dL — ABNORMAL HIGH (ref 70–99)
Potassium: 4.2 mmol/L (ref 3.5–5.1)
Sodium: 138 mmol/L (ref 135–145)

## 2019-07-04 LAB — CBC
HCT: 27.6 % — ABNORMAL LOW (ref 36.0–46.0)
Hemoglobin: 8.5 g/dL — ABNORMAL LOW (ref 12.0–15.0)
MCH: 30.6 pg (ref 26.0–34.0)
MCHC: 30.8 g/dL (ref 30.0–36.0)
MCV: 99.3 fL (ref 80.0–100.0)
Platelets: 483 10*3/uL — ABNORMAL HIGH (ref 150–400)
RBC: 2.78 MIL/uL — ABNORMAL LOW (ref 3.87–5.11)
RDW: 18.8 % — ABNORMAL HIGH (ref 11.5–15.5)
WBC: 15 10*3/uL — ABNORMAL HIGH (ref 4.0–10.5)
nRBC: 0 % (ref 0.0–0.2)

## 2019-07-04 LAB — CULTURE, BLOOD (ROUTINE X 2)
Culture: NO GROWTH
Culture: NO GROWTH
Special Requests: ADEQUATE
Special Requests: ADEQUATE

## 2019-07-04 MED ORDER — OXYCODONE HCL 5 MG PO TABS
5.0000 mg | ORAL_TABLET | Freq: Four times a day (QID) | ORAL | Status: DC | PRN
  Administered 2019-07-04: 10 mg via ORAL
  Administered 2019-07-04: 5 mg via ORAL
  Administered 2019-07-04 – 2019-07-05 (×3): 10 mg via ORAL
  Filled 2019-07-04 (×5): qty 2

## 2019-07-04 MED ORDER — SENNA 8.6 MG PO TABS: 1.0000 | ORAL_TABLET | Freq: Every day | ORAL | Status: DC

## 2019-07-04 MED ORDER — SENNA 8.6 MG PO TABS
2.0000 | ORAL_TABLET | Freq: Two times a day (BID) | ORAL | Status: DC
  Administered 2019-07-04 – 2019-07-30 (×13): 17.2 mg via ORAL
  Filled 2019-07-04 (×32): qty 2

## 2019-07-04 MED ORDER — FOSFOMYCIN TROMETHAMINE 3 G PO PACK
3.0000 g | PACK | Freq: Once | ORAL | Status: DC
  Filled 2019-07-04: qty 3

## 2019-07-04 MED ORDER — SODIUM CHLORIDE 0.9 % IV SOLN
2.0000 g | INTRAVENOUS | Status: AC
  Administered 2019-07-04 – 2019-07-08 (×5): 2 g via INTRAVENOUS
  Filled 2019-07-04 (×5): qty 2

## 2019-07-04 NOTE — Progress Notes (Signed)
Subjective: Monica Stephens reports more pain today. She had been feeling better yesterday until one of her pain meds was discontinued. We discussed the addition of a heating pad and ice as well as additional pain medication. She reports continued dysuria but denies discharge or pruritis. She also reports some moderate fullness of her belly, early satiety and infrequent BMs. She has no other complaints or concerns at this time.   Objective:  Vital signs in last 24 hours: Vitals:   07/03/19 1359 07/03/19 1938 07/03/19 2335 07/04/19 0314  BP: 107/72 112/76 107/62 115/69  Pulse: 99 86 (!) 106 (!) 104  Resp: 18 20 (!) 24 (!) 24  Temp: 97.8 F (36.6 C) 98.6 F (37 C) 98.5 F (36.9 C) 98.6 F (37 C)  TempSrc: Oral Oral Oral Oral  SpO2: 95% 93% 96% 95%  Weight:      Height:       Physical Exam  HENT:  Head: Normocephalic.  Cardiovascular: Regular rhythm, normal heart sounds and intact distal pulses.  No murmur heard. Tachycardic; no lower extremity edema  Pulmonary/Chest: Effort normal and breath sounds normal. No respiratory distress. She has no wheezes. She has no rales.  Tender to palpation across chest wall  Abdominal: Soft. She exhibits distension (moderate distention).  Musculoskeletal: Normal range of motion.     Comments: Less evident swelling in her left axilla, still TTP but no fluctuance or erythema s/p draining of 3 ml  Skin: She is not diaphoretic.   Labs: WBC 15.0 Hgb: 8.5 Urinalysis: amber color, cloudy appearance, trace leukocytes, rare bacteria Abscess fluid cx: gram stain showed abundant WBC both PMN and mononuclear and moderate gram positive cocci; culture reincubated for better growth and still pending   Imaging: US guided needle placement demonstrated successful aspiration of complex fluid collection in the left subpectoral space yielding 3 ml purulent fluid   Assessment/Plan: Monica Stephens is our 38 yo F with a PMHx of IVDU here with MRSA bacteremia and  subsequent complications of painful abscesses.  Principal Problem:   MRSA bacteremia -receiving IV vancomycin for 6 weeks for MRSA bacteremia with septic emboli to lungs, paraspinal muscles and ilium and associated chest wall and axillary absessees  -WBC stable today at 15.0 -pt reports continued pain across her chest wall and bilateral axilla which was being better controlled with addition of prn Roxicodone 10 which was dc'ed however given the alternative addition of gabapentin, toradol and tylenol were not helping we have dc'ed the gabapentin and toradol, left the tylenol and added Roxicodone back; we also recommended using her heating pad as well as ice depending on whether one or the other helps with pain relief  -pt was seen by IR yesterday and they drained 3 ml of fluid from her left axilla which she reports did not really help much in terms of pain relief although there is less swelling on exam; purulent fluid was sent for cx and preliminary gram stain shows moderate gram positive cocci which is consistent with her MRSA bacteremia; cx reincubated for better growth suggesting cx did grow and we will fu on those results to ensure proper antibiotic coverage  Active Problems:   Dysuria -pt continues to report dysuria today without frequency, hematuria, discharge or pruritis for the second day in a row  -urinalysis obtained yesterday demonstrated amber color, cloudy appearance, trace leukocytes and rare bacteria; color and cloudy appearance can obscure the biochemical results and given that she is symptomatic we started Ceftriaxone per pharmacy's recommendations  -  we still sent urine for cx and will fu those results    Constipation -pt reports less frequent bowel movements and a feeling of fullness and early satiety since starting additional opioid pain medications; pt was slightly distended on exam  -we have restarted senna for her and will fu tomorrow    Anemia -Hgb slightly increased to 8.5  today, stable -etiology appears to be multifactorial based on history and numerous lab results -still awaiting smear review by pathologists which we will fu on  -in mean time we will keep an eye on Hgb with CBC a few x a week and monitor for sx s  Dispo: Anticipated discharge upon completion of IV antibiotic therapy in approximately 4 weeks.  Monica Decant, MD 07/04/2019, 6:50 AM Pager: [phone]

## 2019-07-05 DIAGNOSIS — L02411 Cutaneous abscess of right axilla: Secondary | ICD-10-CM

## 2019-07-05 DIAGNOSIS — L02412 Cutaneous abscess of left axilla: Secondary | ICD-10-CM

## 2019-07-05 LAB — HAPTOGLOBIN: Haptoglobin: 350 mg/dL — ABNORMAL HIGH (ref 33–278)

## 2019-07-05 MED ORDER — VANCOMYCIN HCL 10 G IV SOLR
1250.0000 mg | INTRAVENOUS | Status: DC
  Administered 2019-07-06 – 2019-07-30 (×25): 1250 mg via INTRAVENOUS
  Filled 2019-07-05 (×25): qty 1250

## 2019-07-05 MED ORDER — HYDROMORPHONE HCL 1 MG/ML IJ SOLN
1.5000 mg | INTRAMUSCULAR | Status: DC | PRN
  Administered 2019-07-05 – 2019-07-17 (×71): 1.5 mg via INTRAVENOUS
  Filled 2019-07-05 (×72): qty 2

## 2019-07-05 MED ORDER — HYDROMORPHONE 1 MG/ML IV SOLN
INTRAVENOUS | Status: DC
  Administered 2019-07-05: 30 mg via INTRAVENOUS
  Filled 2019-07-05: qty 30

## 2019-07-05 MED ORDER — NALOXONE HCL 0.4 MG/ML IJ SOLN: 0.4000 mg | INTRAMUSCULAR | Status: DC | PRN

## 2019-07-05 MED ORDER — SODIUM CHLORIDE 0.9% FLUSH: 9.0000 mL | INTRAVENOUS | Status: DC | PRN

## 2019-07-05 MED ORDER — HYDROMORPHONE 1 MG/ML IV SOLN: INTRAVENOUS | Status: DC

## 2019-07-05 MED ORDER — OXYCODONE HCL 5 MG PO TABS
10.0000 mg | ORAL_TABLET | Freq: Four times a day (QID) | ORAL | Status: DC | PRN
  Administered 2019-07-05 – 2019-07-18 (×46): 10 mg via ORAL
  Filled 2019-07-05 (×49): qty 2

## 2019-07-05 NOTE — Progress Notes (Signed)
   Subjective: Monica Stephens reports frustration with her pain management today. Her current pain coverage is adequate except when there are delays in receiving it. Otherwise her pain has not worsened. She reports that her dysuria has decreased. She did get help washing her hair yesterday which she appreciated. She reports that she hasn't had a bowel movement since we prescribed her something for her constipation but that she doesn't feel full or bloated anymore. She has no other concerns or complaints.   Objective:  Vital signs in last 24 hours: Vitals:   07/04/19 1816 07/04/19 1943 07/05/19 0011 07/05/19 0418  BP: 114/76 118/73 109/72 105/68  Pulse: (!) 104 (!) 108 (!) 109 97  Resp: (!) 25 (!) 31 19 19   Temp: 99.3 F (37.4 C) 98.4 F (36.9 C) 98.8 F (37.1 C) 98.8 F (37.1 C)  TempSrc: Oral Oral Oral Oral  SpO2: 97% 95% 93% 92%  Weight:    48.5 kg  Height:       Physical Exam  Constitutional: She is oriented to person, place, and time.  Thin appearing female lying in bed  HENT:  Head: Normocephalic and atraumatic.  Cardiovascular: Regular rhythm, normal heart sounds and intact distal pulses.  Tachycardic; no lower extremity edema  Pulmonary/Chest: Effort normal and breath sounds normal. No respiratory distress. She exhibits tenderness.  Chest wall tender to palpation  Abdominal: Soft. Bowel sounds are normal. She exhibits no distension. There is no abdominal tenderness.  Neurological: She is alert and oriented to person, place, and time.  Skin: Skin is warm and dry.  Nursing note and vitals reviewed.  Labs: no labs today, urine cx pending, abscess fluid cx pending   Imaging: no new imaging studies  Assessment/Plan: Monica Stephens is our 38 yo F w/ a PMHx of IVDU here with MRSA bacteremia and subsequent complications of painful abscesses.   Principal Problem:   MRSA bacteremia/abscesses -receiving IV vancomycin for 6 weeks -pt reports continued pain across her chest wall and  bilateral axilla; reports frustration having to wait for pain medications to be brought to her so we set her up for a PCA -Gen surg reconsulted for abscesses and they recommend ortho's input; we will consult them tomorrow -cx results from IR drain of left axillary swelling: gram stain shows moderate gram positive cocci which is consistent with her MRSA bacteremia; culture shows few staphylococcus aureus but no anaerobes isolated; cx still in progress for 5 days; will fu on cx results  Active Problems   Dysuria -pt reports less dysuria than yesterday since addition of rocephin for presumed UTI -fu patient sx and urine cx to ensure proper coverage    Constipation -pt reports that she still hasn't had a BM in a few days but since starting Senna yesterday she feels less bloated and uncomfortable -we will continue to fu on patient sx and exam  Dispo: Anticipated discharge upon completion of IV antibiotics in around 4 weeks.  Al Decant, MD 07/05/2019, 6:02 AM Pager: [phone]

## 2019-07-05 NOTE — Progress Notes (Signed)
28 mL of PCA Dilaudid wasted with Jerald Kief RN

## 2019-07-05 NOTE — Progress Notes (Signed)
18 Days Post-Op   Subjective/Chief Complaint: IR aspirated left chest abscess yesterday. Patient reports pain unchanged   Objective: Vital signs in last 24 hours: Temp:  [98.4 F (36.9 C)-99.3 F (37.4 C)] 98.8 F (37.1 C) (07/05 0418) Pulse Rate:  [97-109] 97 (07/05 0418) Resp:  [19-31] 19 (07/05 0418) BP: (105-118)/(68-76) 105/68 (07/05 0418) SpO2:  [92 %-97 %] 92 % (07/05 0418) Weight:  [48.5 kg] 48.5 kg (07/05 0418) Last BM Date: 07/01/19  Intake/Output from previous day: 07/04 0701 - 07/05 0700 In: 1132 [P.O.:1132] Out: 2068 [Urine:2068] Intake/Output this shift: Total I/O In: -  Out: 700 [Urine:700]  Exam: Awake and alert Chest exam unchanged.  No cellulitis   Lab Results:  Recent Labs    07/03/19 0209 07/04/19 0554  WBC 14.9* 15.0*  HGB 8.2* 8.5*  HCT 26.2* 27.6*  PLT 532* 483*   BMET Recent Labs    07/03/19 0209 07/04/19 0554  NA 138 138  K 3.9 4.2  CL 103 102  CO2 28 26  GLUCOSE 96 116*  BUN 18 29*  CREATININE 0.62 0.55  CALCIUM 7.7* 8.1*   PT/INR No results for input(s): LABPROT, INR in the last 72 hours. ABG No results for input(s): PHART, HCO3 in the last 72 hours.  Invalid input(s): PCO2, PO2  Studies/Results: US Guided Needle Placement  Result Date: 07/03/2019 INDICATION: 38 year old female with left chest wall abscess. She presents for ultrasound-guided aspiration. EXAM: Ultrasound aspiration MEDICATIONS: The patient is currently admitted to the hospital and receiving intravenous antibiotics. The antibiotics were administered within an appropriate time frame prior to the initiation of the procedure. ANESTHESIA/SEDATION: None COMPLICATIONS: None immediate. PROCEDURE: Informed written consent was obtained from the patient after a thorough discussion of the procedural risks, benefits and alternatives. All questions were addressed. A timeout was performed prior to the initiation of the procedure. Ultrasound was used to interrogate the left  chest wall. An ill-defined complex fluid collection is identified in the subpectoral space. The overlying skin was sterilely prepped and draped in the standard sterile fashion. Local anesthesia was attained by infiltration with 1% lidocaine. A small dermatotomy was made. Under real-time sonographic guidance, an 18 gauge trocar needle was advanced into the complex fluid collection. Aspiration was performed yielding approximately 3 mL purulent fluid. The fluid was sent for Gram stain and culture. The patient tolerated the procedure well. IMPRESSION: Successful aspiration of complex fluid collection in the left subpectoral space yielding 3 mL purulent fluid. Sample sent for culture. Electronically Signed   By: Jacqulynn Cadet M.D.   On: 07/03/2019 15:27    Anti-infectives: Anti-infectives (From admission, onward)   Start     Dose/Rate Route Frequency Ordered Stop   07/06/19 1000  vancomycin (VANCOCIN) 1,250 mg in sodium chloride 0.9 % 250 mL IVPB     1,250 mg 166.7 mL/hr over 90 Minutes Intravenous Every 24 hours 07/05/19 1047 07/31/19 2359   07/04/19 1000  cefTRIAXone (ROCEPHIN) 2 g in sodium chloride 0.9 % 100 mL IVPB     2 g 200 mL/hr over 30 Minutes Intravenous Every 24 hours 07/04/19 0955     07/03/19 1530  vancomycin (VANCOCIN) 500 mg in sodium chloride 0.9 % 100 mL IVPB     500 mg 100 mL/hr over 60 Minutes Intravenous NOW 07/03/19 1521 07/03/19 1655   06/20/19 1000  vancomycin (VANCOCIN) IVPB 1000 mg/200 mL premix  Status:  Discontinued     1,000 mg 200 mL/hr over 60 Minutes Intravenous Every 24 hours 06/19/19 2333  07/05/19 1047   06/17/19 1130  vancomycin (VANCOCIN) 500 mg in sodium chloride 0.9 % 100 mL IVPB  Status:  Discontinued     500 mg 100 mL/hr over 60 Minutes Intravenous Every 12 hours 06/17/19 0929 06/19/19 2333   06/15/19 1200  vancomycin (VANCOCIN) IVPB 750 mg/150 ml premix  Status:  Discontinued     750 mg 150 mL/hr over 60 Minutes Intravenous Every 24 hours 06/15/19 1112  06/17/19 0929   06/15/19 0151  vancomycin variable dose per unstable renal function (pharmacist dosing)  Status:  Discontinued      Does not apply See admin instructions 06/15/19 0151 06/15/19 1112   06/14/19 1130  acyclovir (ZOVIRAX) 475 mg in dextrose 5 % 100 mL IVPB     10 mg/kg  47.6 kg 109.5 mL/hr over 60 Minutes Intravenous  Once 06/14/19 1052 06/14/19 1414   06/14/19 1100  ceFEPIme (MAXIPIME) 2 g in sodium chloride 0.9 % 100 mL IVPB     2 g 200 mL/hr over 30 Minutes Intravenous  Once 06/14/19 1048 06/14/19 1308   06/14/19 1100  metroNIDAZOLE (FLAGYL) IVPB 500 mg  Status:  Discontinued     500 mg 100 mL/hr over 60 Minutes Intravenous  Once 06/14/19 1048 06/15/19 1054   06/14/19 1100  vancomycin (VANCOCIN) IVPB 1000 mg/200 mL premix     1,000 mg 200 mL/hr over 60 Minutes Intravenous  Once 06/14/19 1048 06/14/19 1327      Assessment/Plan: s/p Procedure(s): TRANSESOPHAGEAL ECHOCARDIOGRAM (TEE) (N/A)  Principal Problem: MRSA bacteremia Active Problems: Opioid overdose ([place]) Septic pulmonary embolism, bilateral  Suspected endocarditis Active intravenous drug use Pericardial effusion Rash/skin eruption  L axillary abscess tracking along pectoral muscle S/p aspiration yesterday by IR.  Awaiting cultures. Continue Abx. May need Ortho's input regarding pectoralis involvement.   LOS: 21 days    Coralie Keens 07/05/2019

## 2019-07-06 DIAGNOSIS — R1310 Dysphagia, unspecified: Secondary | ICD-10-CM

## 2019-07-06 MED ORDER — ENSURE PRE-SURGERY PO LIQD
296.0000 mL | Freq: Once | ORAL | Status: DC
  Filled 2019-07-06: qty 296

## 2019-07-06 MED ORDER — CHLORHEXIDINE GLUCONATE 4 % EX LIQD: 60.0000 mL | Freq: Once | CUTANEOUS | Status: DC

## 2019-07-06 MED ORDER — LIDOCAINE 5 % EX PTCH
2.0000 | MEDICATED_PATCH | CUTANEOUS | Status: AC
  Administered 2019-07-06: 2 via TRANSDERMAL
  Filled 2019-07-06: qty 2

## 2019-07-06 MED ORDER — METHOCARBAMOL 500 MG PO TABS
500.0000 mg | ORAL_TABLET | Freq: Three times a day (TID) | ORAL | Status: DC
  Administered 2019-07-06 – 2019-07-31 (×76): 500 mg via ORAL
  Filled 2019-07-06 (×75): qty 1

## 2019-07-06 MED ORDER — POVIDONE-IODINE 10 % EX SWAB: 2.0000 "application " | Freq: Once | CUTANEOUS | Status: DC

## 2019-07-06 NOTE — Progress Notes (Signed)
Subjective: Monica Stephens reports worsening of her pain but states that her current pain medicines are sufficent at covering it. She reports new pain that she feels in her chest and back when she moves, almost like she strained a muscle but also the same pain she feels in her chest it just can be felt through her chest to her back. She reports continued pain in her left axilla. She states that her dysuria has essentially resolved. She denies having a BM in 3 days but states she does not feel full or back up. However, she does report feeling full quickly when eating and pain in her throat with soft drinks. She has no other complaints or concerns at this time.  Consults: Ortho seeing patient today for painful abscess approaching the humerus    Objective:  Vital signs in last 24 hours: Vitals:   07/05/19 1756 07/05/19 1921 07/06/19 0014 07/06/19 0517  BP: 108/73 107/70 105/67 102/69  Pulse:  99 94 93  Resp: 15 16 15 13   Temp: 98.5 F (36.9 C) 98.7 F (37.1 C) 98.2 F (36.8 C) 98.3 F (36.8 C)  TempSrc: Oral Oral Oral Oral  SpO2:  100% 100% 96%  Weight:    47.4 kg  Height:        Physical Exam  Constitutional: She is oriented to person, place, and time.  Laying in bed, somewhat uncomfortable appearing   HENT:  Head: Normocephalic and atraumatic.  Moist oropharynx, no erythema or candida   Cardiovascular: Regular rhythm, normal heart sounds and intact distal pulses.  Tachycardic; no lower extremity edema  Pulmonary/Chest: Breath sounds normal. She exhibits tenderness.  Takes shallow breaths 2/2 to pain; tender to palpation on anterior chest wall but not posteriorly   Abdominal: Bowel sounds are normal. There is no abdominal tenderness.  Musculoskeletal:     Comments: Swelling in left axilla that is warm and fluctuant but otherwise unchanged from previous days; continues to be ttp there; no tenderness to palpation of her back along her spine  Neurological: She is alert and oriented to  person, place, and time.  Skin: Skin is warm and dry. No erythema.  Nursing note and vitals reviewed.  Labs: cx results of left axillary abscess still show few staph aureus but no anaerobes isolated, still pending will fu   Imaging: No new imaging   Assessment/Plan:  Assessment: Ms. Sisler is a 38 yo F w/ a PMHx of IVDU here with MRSA bacteremia and subsequent complications of painful abscesses.   Plan: Principal Problem:   MRSA bacteremia/abscesses  -receiving IV vancomycin for 6 weeks -pt reports worsening pain across her chest wall that she can now feel through to her back as well as her bilateral axilla; tried PCA for pain control yesterday but opted to not continue given decreased pain relief; increased dilaudid to 1.5 which she said improved her pain relief, will also add lidocaine patches to chest wall -Per Gen Surg's recs, ortho consulted and planning I&D tomorrow so made NPO at midnight, lovenox dc'ed before surgery but will need to be added again tomorrow -cx results of left axillary abscess still show few staph aureus but no anaerobes isolated, still pending will fu   Active Problems:   Dysuria  -pt reports resolution of dysuria on Rocephin for presumed UTI  -will continue antibiotic and fu pt sx    Constipation/fullness/odynophagia -pt still hasn't had a BM but not concerned about it at this moment and doesn't want additional meds as she  doesn't want it to cause GI distress -does not feel bloated or report any abdominal pain -does endorse some fullness with eating so is mostly drinking ensure; likely secondary to decreased GI transit time from pain medication -also states she has some pain with swallowing, worse with soft drinks, but her oropharynx is clear without erythema or thrush  -will continue monitoring pt BMs and sx  Dispo: Anticipated discharge in approximately 4 weeks when IV antibiotic treatment is complete.   Al Decant, MD 07/06/2019, 6:13 AM Pager:  [phone]
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
## 2019-07-06 NOTE — Progress Notes (Signed)
Pharmacy Antibiotic Note  Monica Stephens is a 38 y.o. female admitted on 06/14/2019 with MRSA bacteremia, endocarditis, axillary abscess .  Pharmacy has been consulted for Vancomycin dosing.  ID: Vanc for MRSA bacteremia and presumed right sided endocarditis. Noted patient has septic emboli on CT chest and possible pericarditis - TEE negative for endocarditis. MRI of spine w/ septic emboli and paraspinal abscesses. L axillary abscess tracking along pectoral muscle (drained7/4) -WBC downtrending 15.7>>15, Tm 99.1>afeb, LA 1.8>1.1, Scr stable and WNL -CT chest (7/2) mild improvement in septic emboli -7/4 pt complaining of dysuria per MD, UA not a clean catch, treating for UTI based on symptom report  6/14 Vanc>> (minimum 6 weeks per ID ) 6/14 Cefepime X 1 6/14 Acyclovir X 1  7/4 CTX >>  6/19 VP/VT 26/13, AUC = 457 on 500mg  q12 >> 1g Q24 for convenience outpt 6/26 VP/VT 27/10, AUC = 458  7/3 VP/VT 31/5: AUC=366.9, 500 mg IV x 1, then increase to 1250mg /24 (not done until 7/6 (next expected AUC 458)  6/14 Bcx x 4/4: staph aureus (BCID MRSA) 6/14 COVID: neg  6/14 Ucx: MRSA 6/16 Wound: MRSA 6/17 Bcx: MRSA 6/20 Bcx: NGF 6/24 Pleual fluid: NGF 6/24 Pleural fluid fungus: NGTD 6/24 COVID: neg  6/29: Bcx: NGTD 7/3 Abscess: SA  Plan: - Vancomycin 1250 mg IV q 24h (I did not change dose 7/3 as intended, changed for 7/6) - consider weekly levels (next 7/13)    Height: 5\' 2"  (157.5 cm) Weight: 104 lb 6.4 oz (47.4 kg) IBW/kg (Calculated) : 50.1  Temp (24hrs), Avg:98.3 F (36.8 C), Min:98 F (36.7 C), Max:98.7 F (37.1 C)  Recent Labs  Lab 06/30/19 0500 07/01/19 0307 07/02/19 0302 07/03/19 0209 07/03/19 1006 07/03/19 1353 07/04/19 0554  WBC 16.0* 18.4* 15.7* 14.9*  --   --  15.0*  CREATININE 0.55 0.61 0.56 0.62  --   --  0.55  VANCOTROUGH  --   --   --   --  5*  --   --   VANCOPEAK  --   --   --   --   --  31  --     Estimated Creatinine Clearance: 72.7 mL/min (by C-G formula  based on SCr of 0.55 mg/dL).    No Known Allergies  Tramell Piechota S. Alford Highland, PharmD, [place] Clinical Staff Pharmacist Eilene Ghazi Stillinger 07/06/2019 8:53 AM

## 2019-07-07 ENCOUNTER — Encounter (HOSPITAL_COMMUNITY): Payer: Self-pay | Admitting: Anesthesiology

## 2019-07-07 ENCOUNTER — Encounter (HOSPITAL_COMMUNITY)
Admission: EM | Disposition: A | Discharge status: Home / Self Care | Payer: Self-pay | Source: Home / Self Care | Attending: Internal Medicine

## 2019-07-07 DIAGNOSIS — F1910 Other psychoactive substance abuse, uncomplicated: Secondary | ICD-10-CM | POA: Diagnosis present

## 2019-07-07 LAB — BASIC METABOLIC PANEL
Anion gap: 10 (ref 5–15)
BUN: 28 mg/dL — ABNORMAL HIGH (ref 6–20)
CO2: 29 mmol/L (ref 22–32)
Calcium: 8.7 mg/dL — ABNORMAL LOW (ref 8.9–10.3)
Chloride: 99 mmol/L (ref 98–111)
Creatinine, Ser: 0.61 mg/dL (ref 0.44–1.00)
GFR calc Af Amer: >60 mL/min (ref >60)
GFR calc non Af Amer: >60 mL/min (ref >60)
Glucose, Bld: 99 mg/dL (ref 70–99)
Potassium: 4.2 mmol/L (ref 3.5–5.1)
Sodium: 138 mmol/L (ref 135–145)

## 2019-07-07 LAB — CBC
HCT: 26.5 % — ABNORMAL LOW (ref 36.0–46.0)
Hemoglobin: 8.2 g/dL — ABNORMAL LOW (ref 12.0–15.0)
MCH: 30.4 pg (ref 26.0–34.0)
MCHC: 30.9 g/dL (ref 30.0–36.0)
MCV: 98.1 fL (ref 80.0–100.0)
Platelets: 375 10*3/uL (ref 150–400)
RBC: 2.7 MIL/uL — ABNORMAL LOW (ref 3.87–5.11)
RDW: 18.8 % — ABNORMAL HIGH (ref 11.5–15.5)
WBC: 10.7 10*3/uL — ABNORMAL HIGH (ref 4.0–10.5)
nRBC: 0 % (ref 0.0–0.2)

## 2019-07-07 LAB — SURGICAL PCR SCREEN
MRSA, PCR: NEGATIVE
Staphylococcus aureus: NEGATIVE

## 2019-07-07 LAB — PATHOLOGIST SMEAR REVIEW

## 2019-07-07 SURGERY — IRRIGATION AND DEBRIDEMENT EXTREMITY
Anesthesia: Choice | Laterality: Left

## 2019-07-07 MED ORDER — MUPIROCIN 2 % EX OINT: 1.0000 "application " | TOPICAL_OINTMENT | Freq: Two times a day (BID) | CUTANEOUS | Status: DC

## 2019-07-07 NOTE — Plan of Care (Signed)
Patient making progressing

## 2019-07-07 NOTE — Progress Notes (Signed)
   Subjective: Monica Stephens reports some anxiety this morning prior to her upcoming surgery today. She has some questions about duration and whether she'll be left open or closed. She reports some improved pain relief with lidocaine patches which we will revisit after surgery. She denies dysuria and states that although she still hasn't had a BM she does not have abdominal pain or otherwise feel constipated. She has no other complaints or concerns.   Consults: Ortho consulted and doing surgery today  Objective:  Vital signs in last 24 hours: Vitals:   07/06/19 1944 07/06/19 2333 07/07/19 0510 07/07/19 0515  BP: 108/77 119/66  116/69  Pulse: 93 97  99  Resp: 18 13  16   Temp: 98 F (36.7 C) 98.2 F (36.8 C)  97.8 F (36.6 C)  TempSrc: Axillary Oral  Oral  SpO2: 97% 93%  97%  Weight:   46.5 kg   Height:        Physical Exam  Constitutional: She is oriented to person, place, and time. No distress.  HENT:  Head: Normocephalic and atraumatic.  Cardiovascular: Regular rhythm.  tachycardic  Pulmonary/Chest: Effort normal. No respiratory distress. She has no wheezes.  Chest wall tender to palpation; respiratory exam limited by chest wall pain with deep inspiration   Abdominal: Soft. Bowel sounds are normal. She exhibits no distension. There is no abdominal tenderness.  Neurological: She is alert and oriented to person, place, and time.  Skin: Skin is warm and dry. No rash noted. She is not diaphoretic. No erythema.  Nursing note and vitals reviewed.  Labs: WBC 10.7 Hgb 8.2  Imaging: No new imaging  Assessment/Plan:  Assessment: Monica Stephens is our 38 yo F w/ a PMHx of IVDU here with MRSA bacteremia and subsequent complications of painful abscesses.   Plan: Principal Problem:   MRSA bacteremia/painful abscesses  -pt receiving IV vancomycin for 6 weeks -continues to have pain in chest and bilateral axilla from known abscesses 2/2 to MRSA bacteremia  -lidocaine patches improved  pain control -ortho saw pt yesterday and will do I&D today so will fu how that procedure goes -cx of aspirated fluid from IR drainage shows MRSA susceptible to vanc  Active Problems:   UTI -pt says dysuria resolved -will continue rocephin for 1 more day for total of 5 days of treatment    Constipation -pt has not had BM in 4 days but states she does not feel constipated and does not want any additional medicines to precipitate a BM -will continue senna and fu on patient sx   Dispo: Anticipated discharge in approximately 4 weeks upon completion of her antibiotic treatment.   Al Decant, MD 07/07/2019, 6:06 AM Pager: [phone]

## 2019-07-07 NOTE — Anesthesia Preprocedure Evaluation (Deleted)
Anesthesia Evaluation    Reviewed: Allergy & Precautions, Patient's Chart, lab work & pertinent test results  Airway        Dental   Pulmonary Current Smoker,           Cardiovascular negative cardio ROS       Neuro/Psych Anxiety Depression Bipolar Disorder negative neurological ROS     GI/Hepatic negative GI ROS, (+)     substance abuse  IV drug use,   Endo/Other  negative endocrine ROS  Renal/GU negative Renal ROS     Musculoskeletal negative musculoskeletal ROS (+) narcotic dependent  Abdominal   Peds  Hematology negative hematology ROS (+)   Anesthesia Other Findings Day of surgery medications reviewed with the patient.  Reproductive/Obstetrics                            Anesthesia Physical Anesthesia Plan Anesthesia Quick Evaluation

## 2019-07-07 NOTE — Progress Notes (Addendum)
Nutrition Follow-up  DOCUMENTATION CODES:   Not applicable  INTERVENTION:   -Monitor for BM (6 days without)   Continue Ensure Enlive po BID, each supplement provides 350 kcal and 20 grams of protein  Continue MVI daily  NUTRITION DIAGNOSIS:   Increased nutrient needs related to acute illness as evidenced by estimated needs.  Ongoing  GOAL:   Patient will meet greater than or equal to 90% of their needs  Progressing  MONITOR:   PO intake, Supplement acceptance, Skin, I & O's, Labs, Weight trends  REASON FOR ASSESSMENT:   Consult Assessment of nutrition requirement/status  ASSESSMENT:   Patient with PMH significant for bipolar disorder, thyroid cancer s/p thyroidectomy, and polysubstance abuse. Presents this admission with right PE with concern for developing empyema.   6/24- thoracentesis   Pt currently NPO for I&D of upper left extremity. Meal completions charted as 50-100% for pt's last four meals prior. She is having trouble with regular BMs and feels constipated. She is drinking Ensure provided per flowsheet.   Admission weight: 53.4 kg Current weight: 46.5 kg   Suspect weight loss is fluid related. Pt continues with +1 edema- continues to improve.   Medications: MVI with minerals, miralax, senokot Labs: reviewed   Diet Order:   Diet Order            Diet NPO time specified  Diet effective midnight              EDUCATION NEEDS:   Education needs have been addressed  Skin:  Skin Assessment: Skin Integrity Issues: Skin Integrity Issues:: Other (Comment) Other: blister-mid chest  Last BM:  7/1  Height:   Ht Readings from Last 1 Encounters:  06/14/19 5\' 2"  (1.575 m)    Weight:   Wt Readings from Last 1 Encounters:  07/07/19 46.5 kg    Ideal Body Weight:  50 kg  BMI:  Body mass index is 18.75 kg/m.  Estimated Nutritional Needs:   Kcal:  1600-1800 kcal  Protein:  80-95 grams  Fluid:  >/= 1.6 L/day  Mariana Single RD,  [facility] Clinical Nutrition Pager # - [phone]

## 2019-07-08 DIAGNOSIS — M546 Pain in thoracic spine: Secondary | ICD-10-CM

## 2019-07-08 LAB — AEROBIC/ANAEROBIC CULTURE W GRAM STAIN (SURGICAL/DEEP WOUND)

## 2019-07-08 MED ORDER — NALOXEGOL OXALATE 25 MG PO TABS
25.0000 mg | ORAL_TABLET | Freq: Every day | ORAL | Status: DC
  Administered 2019-07-08 – 2019-07-30 (×22): 25 mg via ORAL
  Filled 2019-07-08 (×25): qty 1

## 2019-07-08 MED ORDER — LIDOCAINE 5 % EX PTCH
2.0000 | MEDICATED_PATCH | CUTANEOUS | Status: AC
  Administered 2019-07-08: 2 via TRANSDERMAL
  Filled 2019-07-08: qty 2
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
## 2019-07-08 NOTE — Progress Notes (Signed)
   Subjective: Monica Stephens continues to be experiencing lots of pain from her chest and axillary abscesses. She continues to state that she feels her chest pain when she moves her back. She says the lidocaine patches were somewhat helpful and would like those again. She is disappointed that her surgery was canceled. She reports some frustration from delays in getting her pain medications and feelings of judgement from nursing. She reports having her mother and a friend to talk to. She has had some continued dysuria and still has not had a BM in about a week. She is amenable to something to help her bowels. She has no other complaints or concerns.   Consults: gen surg, ortho, IR  Ortho decided to cancel procedure after reviewing imaging. They determined it was unlikely to be beneficial and we already had aspiration cx to follow from her IR procedure days ago.   Objective:  Vital signs in last 24 hours: Vitals:   07/07/19 1942 07/07/19 2339 07/08/19 0338 07/08/19 0500  BP: 133/80 110/76 107/75   Pulse: 99 (!) 104 92   Resp: 16 20 16    Temp: 98.4 F (36.9 C) 99.1 F (37.3 C) 98.1 F (36.7 C)   TempSrc: Oral Oral Oral   SpO2: 95% 93% 95%   Weight:    44.8 kg  Height:        Physical Exam  Constitutional: She is oriented to person, place, and time.  Thin appearing female lying in bed  HENT:  Head: Normocephalic and atraumatic.  Cardiovascular: Normal rate, regular rhythm, normal heart sounds and intact distal pulses.  No murmur heard. No lower extremity edema  Pulmonary/Chest: Effort normal and breath sounds normal. No respiratory distress. She has no wheezes. She has no rales.  Chest wall tenderness  Abdominal: Soft. She exhibits no distension. There is no abdominal tenderness.  Neurological: She is alert and oriented to person, place, and time.  Skin: Skin is warm and dry. She is not diaphoretic. No erythema.  Yellow ecchymose on lateral right lower leg   Labs: No new labs   Imaging: No new imaging   Assessment/Plan:  Assessment: Ms. Ullmer is a 38 yo F w/ a PMHx of IVDU here with MRSA bacteremia and subsequent complications of painful abscesses.   Plan: Principal Problem:   MRSA bacteremia -receiving IV vancomycin for 6 weeks -pt reports continued pain with pain felt in her back as well -will add lidocaine patches again as they have been helpful before -followed up with ortho who cancelled I&D; they did not think it would be therapeutic after reviewing imaging  Active Problems:   Dysuria -pt reports an instance of dysuria yesterday after telling us her dysuria had resolved -pt finished rocephin course today -will fu sx tomorrow    Constipation -patient hasn't had a BM in a week -we discussed with her wanting to avoid any additional problems from not having a BM so we added an additional medication to help with having a BM and she was amenable -also discussed that constipation can cause UTIs  Dispo: Anticipated discharge in approximately 3 weeks after completion of IV antibiotic regimen.   Al Decant, MD 07/08/2019, 6:04 AM Pager: [phone]

## 2019-07-09 ENCOUNTER — Inpatient Hospital Stay (HOSPITAL_COMMUNITY): Payer: Self-pay

## 2019-07-09 LAB — VANCOMYCIN, PEAK: Vancomycin Pk: 37 ug/mL (ref 30–40)

## 2019-07-09 IMAGING — MR MRI THORACIC SPINE WITHOUT AND WITH CONTRAST
6 of 10 series · 25 of 48 positions shown · IV contrast (gadavist)
Comparison: Chest CT [DATE].  Thoracic spine MRI [DATE].

CLINICAL DATA: 37-year-old female with thoracic spine pain.
Bacteremia with lumbar paraspinal muscle emboli, septic pulmonary
emboli. Right side pleural effusion.

EXAM:
MRI THORACIC WITHOUT AND WITH CONTRAST
TECHNIQUE: Multiplanar and multiecho pulse sequences of the thoracic spine were
obtained without and with intravenous contrast.
CONTRAST:  5 milliliters Gadavist

[Series 12: T1 · sagittal · 5.0mm · 1.46mm/px · 2 of 9 slices shown (1 of 4)]
[im 1/9]
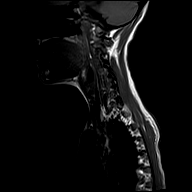
[im 9/9]
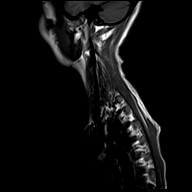

[Series 14: T1 · sagittal · 6.0mm · 1.23mm/px · 2 of 9 slices shown (2 of 4)]
[im 1/9]
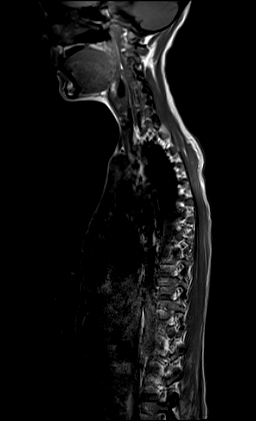
[im 9/9]
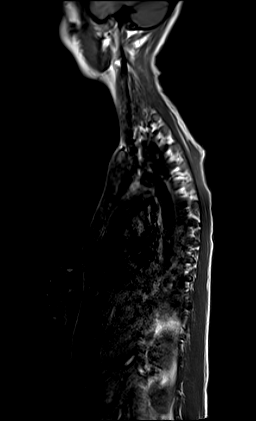

[Series 15: T2 · sagittal · 3.0mm · 0.76mm/px · 4 of 19 slices shown (1 of 2)]
[im 1/19]
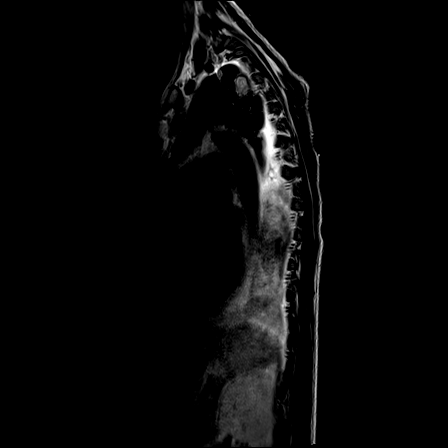
[im 7/19]
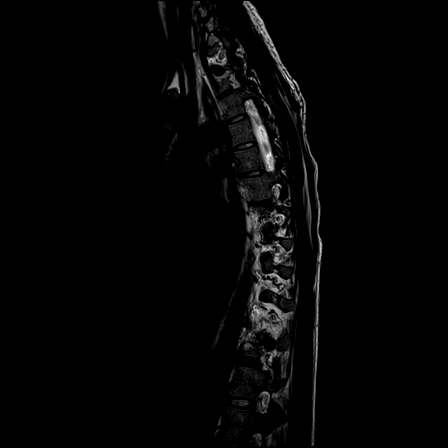
[im 13/19]
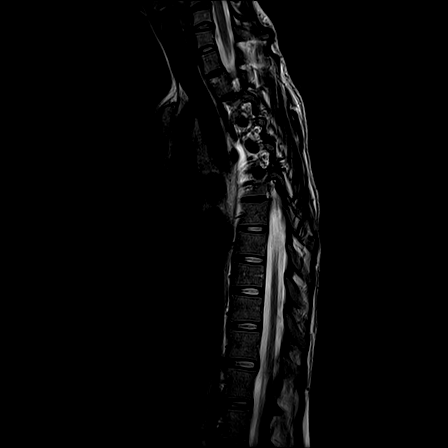
[im 19/19]
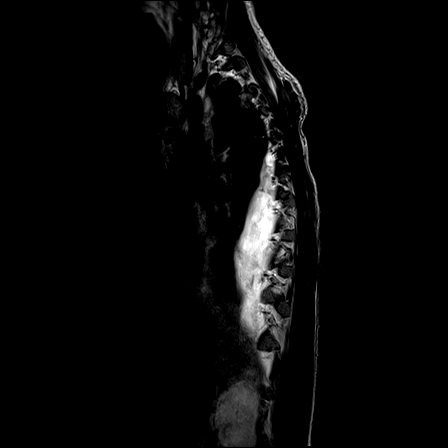

[Series 16: T1 · sagittal · 3.0mm · 0.76mm/px · 4 of 19 slices shown (3 of 4)]
[im 1/19]
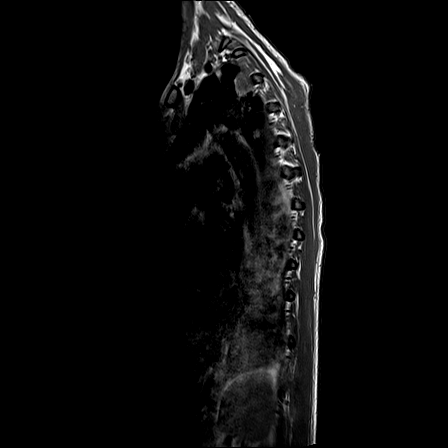
[im 7/19]
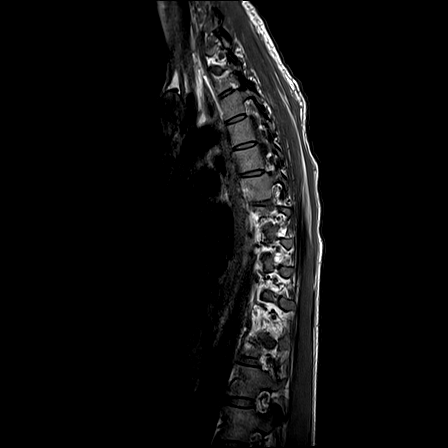
[im 13/19]
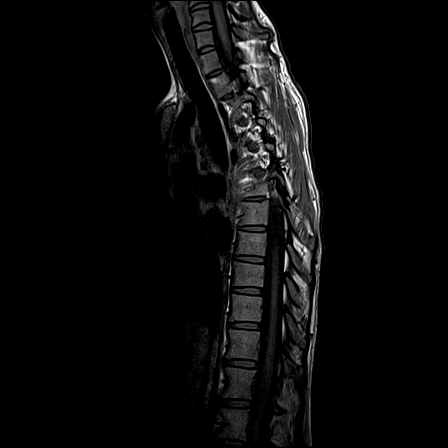
[im 19/19]
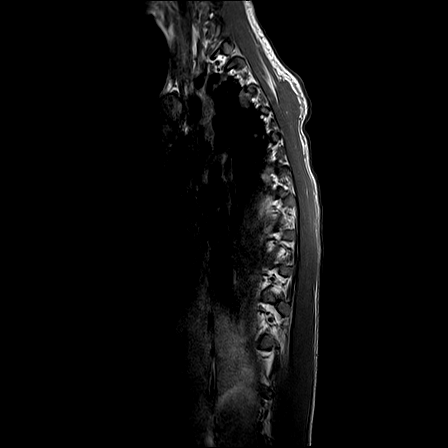

[Series 21: T1 · axial · non-contrast · 4.0mm · 0.31mm/px · z∈[-169,+28]mm · 6 of 39 slices shown (4 of 4)]
[im 1/39]
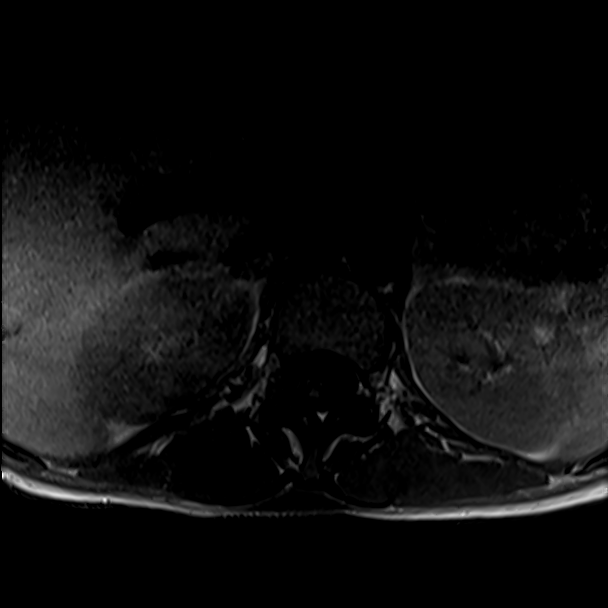
[im 7/39]
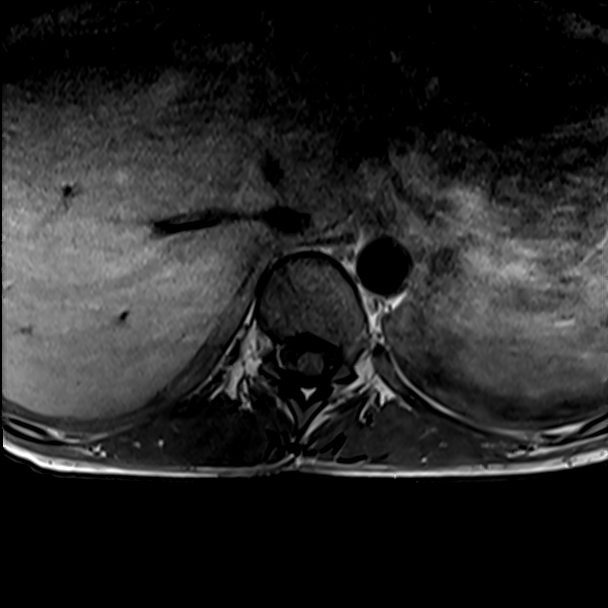
[im 13/39]
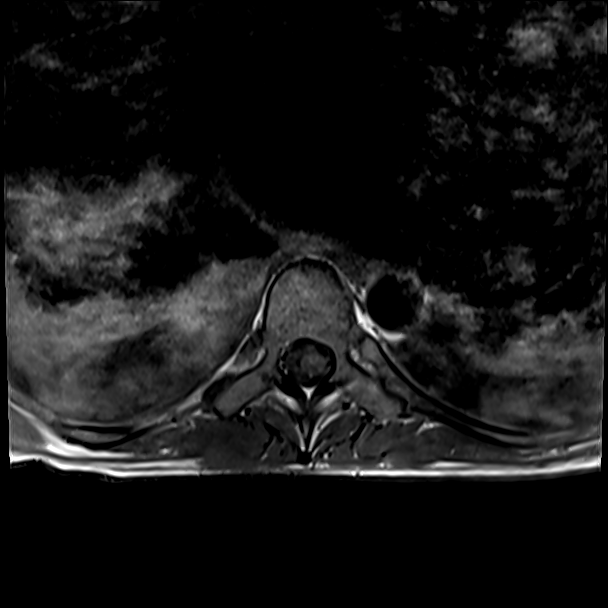
[im 20/39]
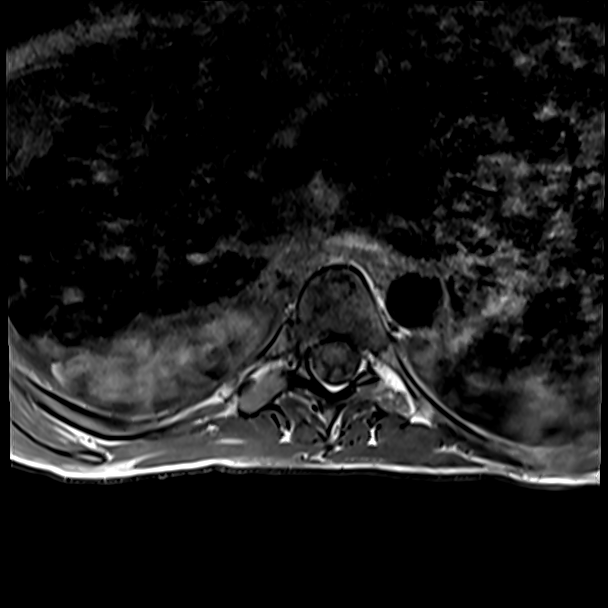
[im 26/39]
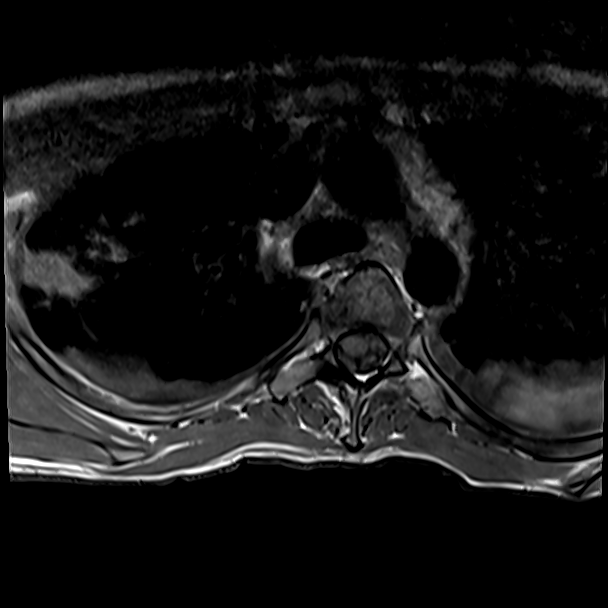
[im 32/39]
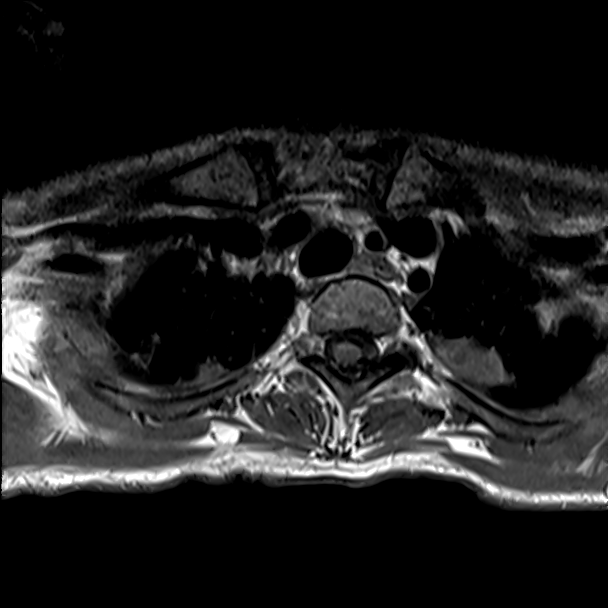

[Series 22: T2 · axial · 4.0mm · 0.59mm/px · z∈[-169,+77]mm · 7 of 39 slices shown (2 of 2)]
[im 1/39]
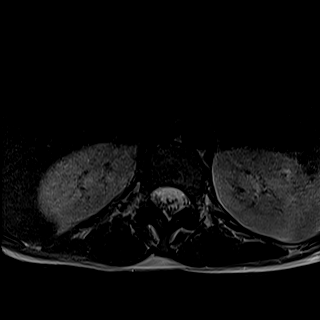
[im 7/39]
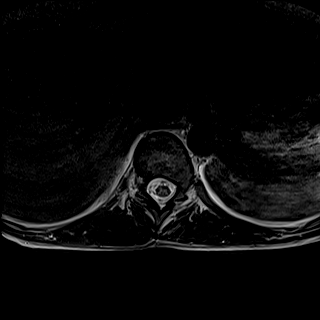
[im 13/39]
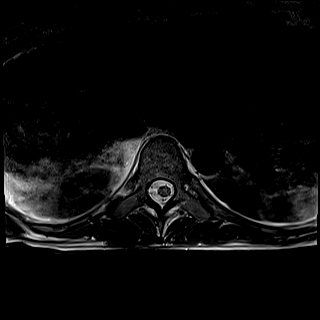
[im 20/39]
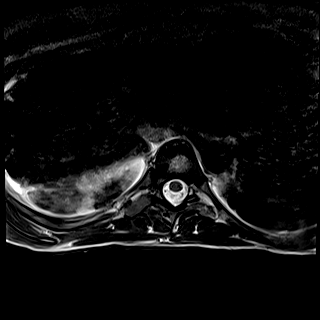
[im 26/39]
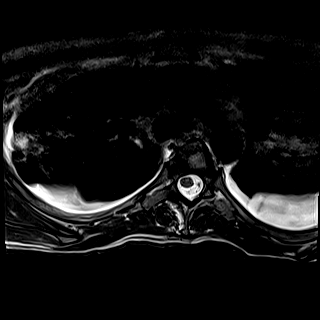
[im 32/39]
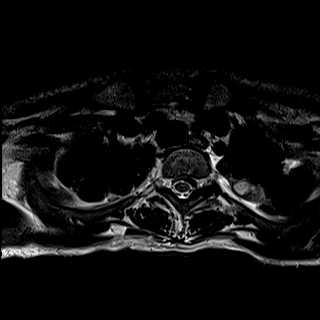
[im 39/39]
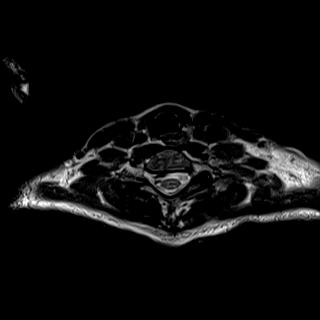

[25 of 48 positions shown; findings below may reference images not displayed]

FINDINGS: Limited cervical spine imaging: Mild reversal of cervical lordosis.
Otherwise grossly negative.

Thoracic spine segmentation:  Appears to be normal.

Alignment: Stable mild thoracic scoliosis. Stable thoracic kyphosis.
Preserved thoracic vertebral height and alignment. No
spondylolisthesis.

Vertebrae: Visualized bone marrow signal is within normal limits. No
marrow edema or evidence of acute osseous abnormality.

Cord: Spinal cord signal is within normal limits at all visualized
levels. No abnormal intradural enhancement. No thoracic dural
thickening. Capacious spinal canal. The conus medullaris appears
normal at T12.

Paraspinal and other soft tissues: Abnormal lungs with bilateral
pleural fluid. Confluent partially enhancing partially necrotic
appearing probable septic embolus in the posterior subpleural left
upper lobe (series 23, image 19).

No pericardial effusion is evident. Negative visible upper abdominal
viscera.

The thoracic paraspinal muscles demonstrates subtle if any T2
heterogeneity and enhancement, regressed since [DATE].

Disc levels:

Normal intervertebral disc signal and morphology from C6-C7 through
T12-L1.

There is some scoliosis related upper thoracic facet hypertrophy on
the left.
IMPRESSION: 1. Negative for thoracic discitis osteomyelitis. No acute osseous
abnormality. Normal thoracic disc spaces and spinal cord.
2. Thoracic paraspinal muscle septic emboli have regressed since
[DATE]. Continued abnormal lungs compatible with pulmonary septic emboli.
Bilateral pleural fluid.

## 2019-07-09 MED ORDER — HYDROMORPHONE HCL 1 MG/ML IJ SOLN: 1.5000 mg | Freq: Once | INTRAMUSCULAR | Status: AC

## 2019-07-09 MED ORDER — RAMELTEON 8 MG PO TABS
8.0000 mg | ORAL_TABLET | Freq: Every day | ORAL | Status: DC
  Administered 2019-07-09 – 2019-07-30 (×22): 8 mg via ORAL
  Filled 2019-07-09 (×23): qty 1

## 2019-07-09 MED ORDER — GADOBUTROL 1 MMOL/ML IV SOLN
5.0000 mL | Freq: Once | INTRAVENOUS | Status: AC | PRN
  Administered 2019-07-09: 5 mL via INTRAVENOUS

## 2019-07-09 MED ORDER — LIDOCAINE 5 % EX PTCH
2.0000 | MEDICATED_PATCH | CUTANEOUS | Status: AC
  Administered 2019-07-09: 2 via TRANSDERMAL
  Filled 2019-07-09: qty 2

## 2019-07-09 NOTE — Care Management (Signed)
LM w Shanon Rosser in financial counseling to address Medicaid application/ ensure it has been initiated.
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
## 2019-07-09 NOTE — Progress Notes (Signed)
   Subjective: Monica Stephens was sitting up brushing her teeth this morning. She had just washed her hair and felt clean and refreshed although she was concerned her hair was falling out. She has continued pain in her chest and back. She denies dysuria. She still has not had a BM and has had some abdominal pain and so is amenable to continued additional medications for that. When asked if she would like to talk to a spiritual counselor she was interested. She has no other complaints or concerns at this time.  Consults: spiritual counseling   Objective:  Vital signs in last 24 hours: Vitals:   07/08/19 2300 07/09/19 0321 07/09/19 0500 07/09/19 0747  BP: (!) 143/79 106/70  107/72  Pulse: 100 97  99  Resp: 19   18  Temp: 98.7 F (37.1 C) 98.2 F (36.8 C)  98.8 F (37.1 C)  TempSrc: Oral Oral  Oral  SpO2: 98% 95%  95%  Weight:   44.4 kg   Height:        Physical Exam  Constitutional: She is oriented to person, place, and time.  Thin appearing female   HENT:  Head: Normocephalic and atraumatic.  Cardiovascular: Regular rhythm.  tachycardic  Pulmonary/Chest: Effort normal and breath sounds normal. No respiratory distress.  Chest wall tenderness; shallow breaths secondary to pain  Abdominal: Soft. Bowel sounds are normal. She exhibits no distension. There is no abdominal tenderness.  Musculoskeletal:     Comments: Bony tenderness over thoracic vertebra with mild tenderness to palpation over proximal paraspinal muscles bilaterally   Neurological: She is alert and oriented to person, place, and time.  Skin: Skin is warm and dry. No rash noted. No erythema.  Nursing note and vitals reviewed.  I/Os: no BMs in 7 days   Labs: no new labs  Imaging: no new imaging   Assessment/Plan:  Assessment: Monica Stephens is a 38 yo F with a PMHx of IVDU here with MRSA bacteremia and subsequent complications of painful abscesses.  Plan: Principal Problem:   MRSA bacteremia -receiving IV vancomycin  for 6 weeks -pt reports continued pain in chest and new pain in her back for the last 7 days worsened with movement -continuing to use lidocaine in addition to IV dilaudid and po oxycodone for pain relief -patient had bony tenderness in her thoracic vertebra on exam and given this new exam finding we will image her thoracic spine with MRI to evaluate for osteo and discitis, neither of which were present on admission MRI - will add one time extra dose of dilaudid prior to MRI to help with pain relief during procedure  -pt is feeling somewhat down and frustrated given she is alone in the hospital for so long, she has lots of pain and has felt judgment from staff about pain med usage; she is interested in talking to someone; spiritual counseling was consulted -fu MRI results   Active Problems:   Constipation -pt still has not had a BM in one week and she is experiencing some abdominal pain  -continue daily senna and daily movantik which was added yesterday -will fu patient output and sx   Dispo: Anticipated discharge in approximately 3 weeks upon completion of IV antibiotic regimen.   Al Decant, MD 07/09/2019, 10:23 AM Pager: [phone]

## 2019-07-10 DIAGNOSIS — L0291 Cutaneous abscess, unspecified: Secondary | ICD-10-CM

## 2019-07-10 LAB — CBC
HCT: 26.3 % — ABNORMAL LOW (ref 36.0–46.0)
Hemoglobin: 8 g/dL — ABNORMAL LOW (ref 12.0–15.0)
MCH: 30.8 pg (ref 26.0–34.0)
MCHC: 30.4 g/dL (ref 30.0–36.0)
MCV: 101.2 fL — ABNORMAL HIGH (ref 80.0–100.0)
Platelets: 317 10*3/uL (ref 150–400)
RBC: 2.6 MIL/uL — ABNORMAL LOW (ref 3.87–5.11)
RDW: 18.6 % — ABNORMAL HIGH (ref 11.5–15.5)
WBC: 8.3 10*3/uL (ref 4.0–10.5)
nRBC: 0 % (ref 0.0–0.2)

## 2019-07-10 LAB — BASIC METABOLIC PANEL
Anion gap: 9 (ref 5–15)
BUN: 29 mg/dL — ABNORMAL HIGH (ref 6–20)
CO2: 27 mmol/L (ref 22–32)
Calcium: 8.6 mg/dL — ABNORMAL LOW (ref 8.9–10.3)
Chloride: 101 mmol/L (ref 98–111)
Creatinine, Ser: 0.56 mg/dL (ref 0.44–1.00)
GFR calc Af Amer: >60 mL/min (ref >60)
GFR calc non Af Amer: >60 mL/min (ref >60)
Glucose, Bld: 95 mg/dL (ref 70–99)
Potassium: 4.1 mmol/L (ref 3.5–5.1)
Sodium: 137 mmol/L (ref 135–145)

## 2019-07-10 LAB — VANCOMYCIN, TROUGH: Vancomycin Tr: 11 ug/mL — ABNORMAL LOW (ref 15–20)

## 2019-07-10 MED ORDER — FOLIC ACID 1 MG PO TABS
1.0000 mg | ORAL_TABLET | Freq: Every day | ORAL | Status: DC
  Administered 2019-07-10 – 2019-07-31 (×22): 1 mg via ORAL
  Filled 2019-07-10 (×23): qty 1

## 2019-07-10 MED ORDER — LIDOCAINE 5 % EX PTCH
2.0000 | MEDICATED_PATCH | CUTANEOUS | Status: DC
  Administered 2019-07-10 – 2019-07-11 (×2): 2 via TRANSDERMAL
  Filled 2019-07-10 (×3): qty 2

## 2019-07-10 MED ORDER — ENOXAPARIN SODIUM 30 MG/0.3ML ~~LOC~~ SOLN
30.0000 mg | SUBCUTANEOUS | Status: DC
  Administered 2019-07-12: 30 mg via SUBCUTANEOUS
  Filled 2019-07-10 (×2): qty 0.3
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
## 2019-07-10 NOTE — Plan of Care (Signed)
POC in place and progressing 

## 2019-07-10 NOTE — Progress Notes (Signed)
Subjective: Monica Stephens reports feeling tired this morning. She says she had a small bowel movement and that she has no abdominal pain or distention. She reports unchanged pain. She would like lidocaine patches again today. Pt states her mom is bringing her some books today. She would like [place] to call her mom to give an update which we have done. She is still interested in talking to a spiritual counselor/non denominational clergy. She has no other complaints or concerns.   Consults: spiritual counseling   Objective:  Vital signs in last 24 hours: Vitals:   07/09/19 1837 07/09/19 2027 07/09/19 2342 07/10/19 0346  BP: 107/72 111/83 115/74 101/75  Pulse: 94 (!) 109 (!) 105 96  Resp: 18 20 18 16   Temp: 98.5 F (36.9 C) 98.3 F (36.8 C) 98.8 F (37.1 C) 98.5 F (36.9 C)  TempSrc: Oral Oral Oral Oral  SpO2: 96% 95% 97% 93%  Weight:    46.2 kg  Height:       Physical Exam  Constitutional: She is oriented to person, place, and time.  Thin appearing female  Cardiovascular: Regular rhythm, normal heart sounds and intact distal pulses.  tachy  Pulmonary/Chest: Effort normal and breath sounds normal. No respiratory distress. She exhibits tenderness.  Difficult to assess due to pain with inspiration  Abdominal: Soft. Bowel sounds are normal. She exhibits distension (moderately distended). There is no abdominal tenderness.  Neurological: She is alert and oriented to person, place, and time.  Skin: Skin is warm and dry. No rash noted. No erythema.  Psychiatric:  Depressed affect  Nursing note and vitals reviewed.  I/Os: Reports a small bowel movement   Labs: CBC Latest Ref Rng & Units 07/10/2019 07/07/2019 07/04/2019  WBC 4.0 - 10.5 K/uL 8.3 10.7(H) 15.0(H)  Hemoglobin 12.0 - 15.0 g/dL 8.0(L) 8.2(L) 8.5(L)  Hematocrit 36.0 - 46.0 % 26.3(L) 26.5(L) 27.6(L)  Platelets 150 - 400 K/uL 317 375 483(H)   BMP Latest Ref Rng & Units 07/10/2019 07/07/2019 07/04/2019  Glucose 70 - 99 mg/dL 95 99 116(H)   BUN 6 - 20 mg/dL 29(H) 28(H) 29(H)  Creatinine 0.44 - 1.00 mg/dL 0.56 0.61 0.55  Sodium 135 - 145 mmol/L 137 138 138  Potassium 3.5 - 5.1 mmol/L 4.1 4.2 4.2  Chloride 98 - 111 mmol/L 101 99 102  CO2 22 - 32 mmol/L 27 29 26   Calcium 8.9 - 10.3 mg/dL 8.6(L) 8.7(L) 8.1(L)   Imaging: Negative for thoracic discitis and osteomyelitis. No acute osseous abnormality. Normal thoracic disc spaces and spinal cord. Thoracic paraspinal muscle septic emboli have regressed since June. Continued abnormal lungs compatible with pulmonary septic emboli. Bilateral pleural fluid.  Assessment/Plan:  Assessment: Monica Stephens is our 38 yo F with a PMHx of IVDU here with MRSA bacteremia and subsequent complications of painful abscesses with resolution of elevated WBC and  regression of known septic emboli on imaging.   Plan: Principal Problem:   MRSA bacteremia/painful abscesss -receiving IV vanc for 6 weeks -pt reports continued pain in chest and back which is tolerable on current pain regiment; she reports additional relief with lidocaine patches that we'll continue to order -we obtained an MRI of the thoracic spine due to a complaint of new back pain the last few days which was negative for thoracic discitis and osteomyelitis and showed interval regression of paraspinal muscle septic emboli noted back in June -she asked that we update her mother which we've done   Active Problems:   Constipation -pt had not  had a BM in a week with chronic opioid use as a likely precipitant -she is on two medications to assist with bowel movements and finally had a small one -she denies any abdominal pain or distention although she did feel somewhat distended on my exam -we will continue to treat her constipation and will fu on stool output    Depression -patient has a hx of depression for which she is receiving treatment with buspirone while here  -she has started to seem more depressed the last few days which is somewhat  understandable given she's been alone in the hospital for weeks; she's also feeling judgment from some staff when she asks for pain medication -we have consulted spiritual counseling and will fu to see when they can talk with her  -we will fu on how she's feeling and if it tends to get worse we may add more medications    Anemia  -pt has known stable anemia likely multifactorial, anemia of chronic disease vs poor nutrition vs iron deficiency which has been worked up extensively -recent smear showed normocytic anemia but today's CBC showed MCV > 449 -added folic acid in case that is contributing   Dispo: Anticipated discharge in approximately 3 weeks upon completion of IV antibiotic course.   Al Decant, MD 07/10/2019, 5:39 AM Pager: [phone]

## 2019-07-10 NOTE — Progress Notes (Signed)
Pharmacy Antibiotic Note  Monica Stephens is a 38 y.o. female admitted on 06/14/2019 with MRSA bacteremia, endocarditis, axillary abscess.  Pharmacy has been consulted for Vancomycin dosing.  Found to have MRSA bacteremia and presumed right sided endocarditis. Noted patient has septic emboli on CT chest and possible pericarditis - TEE negative for endocarditis. MRI of spine w/ septic emboli and paraspinal abscesses. L axillary abscess tracking along pectoral muscle (drained 7/4).   7/10 VP/VT 37/11, AUC 452 - continue same dose since in therapeutic range - Afebrile; WBC, LA, Scr stable and WNL  Plan: - Vancomycin 1250 mg IV q 24h for min 6 weeks per ID - Consider weekly levels (next 7/17)   Height: 5\' 2"  (157.5 cm) Weight: 101 lb 13.6 oz (46.2 kg) IBW/kg (Calculated) : 50.1  Temp (24hrs), Avg:98.4 F (36.9 C), Min:98 F (36.7 C), Max:98.8 F (37.1 C)  Recent Labs  Lab 07/03/19 1006 07/03/19 1353 07/04/19 0554 07/07/19 0500 07/09/19 1348 07/10/19 0414 07/10/19 0415  WBC  --   --  15.0* 10.7*  --  8.3  --   CREATININE  --   --  0.55 0.61  --  0.56  --   VANCOTROUGH 5*  --   --   --   --   --  11*  VANCOPEAK  --  31  --   --  37  --   --     Estimated Creatinine Clearance: 70.9 mL/min (by C-G formula based on SCr of 0.56 mg/dL).    No Known Allergies   6/14 Vanc >> (min 6 weeks per ID) 6/14 Cefepime x1 6/14 Acyclovir x1  7/4 CTX >> 7/8 d/c'd  6/14 Bcx x 4/4: staph aureus (BCID MRSA) 6/14 COVID: neg  6/14 Ucx: MRSA 6/16 Wound: MRSA 6/17 Bcx: MRSA 6/20 Bcx: NGF 6/24 Pleual fluid: NGF 6/24 Pleural fluid fungus: NGTD 6/24 COVID: neg  6/29: Bcx: NGTD 7/3 Abscess: SA  Agnes Lawrence, PharmD PGY1 Pharmacy Resident

## 2019-07-11 NOTE — Plan of Care (Signed)
  Problem: Education: Goal: Knowledge of General Education information will improve Description: Including pain rating scale, medication(s)/side effects and non-pharmacologic comfort measures Outcome: Progressing   Problem: Health Behavior/Discharge Planning: Goal: Ability to manage health-related needs will improve Outcome: Progressing   Problem: Clinical Measurements: Goal: Ability to maintain clinical measurements within normal limits will improve Outcome: Progressing Goal: Will remain free from infection Outcome: Progressing Goal: Diagnostic test results will improve Outcome: Progressing Goal: Respiratory complications will improve Outcome: Progressing Goal: Cardiovascular complication will be avoided Outcome: Progressing   Problem: Activity: Goal: Risk for activity intolerance will decrease Outcome: Progressing   Problem: Nutrition: Goal: Adequate nutrition will be maintained Outcome: Progressing   Problem: Coping: Goal: Level of anxiety will decrease Outcome: Progressing   Problem: Pain Managment: Goal: General experience of comfort will improve Outcome: Progressing   Problem: Safety: Goal: Ability to remain free from injury will improve Outcome: Progressing   Problem: Skin Integrity: Goal: Risk for impaired skin integrity will decrease Outcome: Progressing   Problem: Fluid Volume: Goal: Hemodynamic stability will improve Outcome: Progressing   Problem: Clinical Measurements: Goal: Diagnostic test results will improve Outcome: Progressing Goal: Signs and symptoms of infection will decrease Outcome: Progressing   Problem: Respiratory: Goal: Ability to maintain adequate ventilation will improve Outcome: Progressing

## 2019-07-11 NOTE — Progress Notes (Signed)
Ms Monica Stephens called out at 1905 to state that she fells like is having a panic attack.  Encouraged to slow her breathing and try to relax.  Asked Ms Monica Stephens what I could do to help her.  Offered her Vistaril and she accepted.  Medication was given.

## 2019-07-11 NOTE — Progress Notes (Signed)
Monica Stephens is constantly asking for pain medications calling every two hours or asking this nurse each time she is in the room.  Requests to have her OxyContin and Hydrocodone at the same time.  Instructed that she will have to wait at least two hours in between each medication.  Monica Stephens is resting comfortably in the bed and does not appear to be in any pain or distress.

## 2019-07-11 NOTE — Progress Notes (Signed)
   Subjective:  No acute events overnight. She is endorsing pain in her chest this morning. She was due for her pain medicine. Otherwise no acute complaints. She had a bowel movement yesterday. Eating well.   Objective:  Vital signs in last 24 hours: Vitals:   07/10/19 0346 07/10/19 0734 07/10/19 1221 07/10/19 1928  BP: 101/75 104/76 95/69 112/82  Pulse: 96  91 100  Resp: 16 18 18 20   Temp: 98.5 F (36.9 C) 98.4 F (36.9 C) 98.9 F (37.2 C) 98.8 F (37.1 C)  TempSrc: Oral Oral Oral Oral  SpO2: 93%  95% 97%  Weight: 46.2 kg     Height:       General: chronically ill appearing female in no acute distress  CV: RRR, no mrg, chest wall tenderness  Pulm: clear anteriorly, comfortable on RA  Abd: mildly distended but soft and non-tender, normoactive bowel sounds  Ext: warm and well perfused without edema   Assessment/Plan:  Principal Problem:   MRSA bacteremia Active Problems:   Opioid overdose (HCC)   Septic pulmonary embolism, bilateral    Suspected endocarditis   Active intravenous drug use   Pericardial effusion   Rash/skin eruption   Abscess of bursa of left shoulder extending into chest and upper arm    Intravenous drug abuse (HCC)  # MRSA bacteremia with suspected IE: On a 6-week course of IV antibiotics. Last dose scheduled for 7/31. Renal function stable. We are working on pain management as below.   # Opioid-induced constipation: Had a bowel movement yesterday. Will continue Senokot BID and Movantik.   # OUD: Pain usually well-controlled when she receives medications on time. We will work on this. Continue IV Dilaudid q4h PRN and oxycodone 10 mg q6h PRN. Will need wean and discuss future plans (Suboxone vs methadone clinic) close to discharge.    # Depression  - Continue Buspirone - Spiritual care consult   Dispo: Pending completion of IV antibiotic therapy, last dose 7/31.   Welford Roche, MD 07/11/2019, 5:52 AM Pager: [phone]
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
## 2019-07-11 NOTE — Significant Event (Signed)
Pt has the presumption that she is being judged by the nursing staff, in reference to pain medication. Patient wants the medications to be administered as scheduled meds. A re-evaluation or re-scheduling of patient pain medication  would alleviate any presumption the patient may have. Patient ask "to be wake up" while she is asleep to give her the PRN pain medication. Patient refuses her Lovenox because it is too painfull.

## 2019-07-11 NOTE — Plan of Care (Signed)
POC in place and progressing 

## 2019-07-11 NOTE — Progress Notes (Signed)
Monica Stephens, became upset with that she had to wait 30 minutes for her IV pain medication.  This nurse was unable to give her the prn medication at 5pm due to obligations with a confused patient in another room.  Monica Stephens called to the nursing station 4-5 times within 30 minutes and called this nurse on her cell phone demanding her medications.  Explained that I would get the medication asap.  Upon entering the room, at 5:30 pm, she was angry that she had to wait, stating her pain was extremely bad.  I connected the NS bag to her picc line and gave the dilaudid slowly over 2 minutes.  Monica Stephens became angry that I did not push the medication fast and follow it with a NS flush.  I explained that this was a more efficient way of giving the medication and that I do not push pain medications fast.  Monica Stephens told me she did not like to receive her pain medication this way.  I asked her how I was supposed to give her this medication and she stated that she "was not stupid and that I had better shut up."  Monica Stephens stated that I had judged her all day and that this was the last day that I would take care of her.  I explained to her that I felt she was watching the clock for her next dose of pain medications, refusing to take the po pain medications over the IV medications.  During morning report she demanded to have her scheduled pain medications within 15 minutes, I explained that the pain medications were not scheduled, but were to be used as needed.  Monica Stephens rolled her eyes at me and told me that each nurse gave the pain medications differently.  I also discussed a plan with her earlier in the day that she could have pain medications every 3-4 hours by waiting in between doses and using the po meds with the IV meds.  She was agreeable to this at the time.

## 2019-07-12 DIAGNOSIS — S2220XD Unspecified fracture of sternum, subsequent encounter for fracture with routine healing: Secondary | ICD-10-CM

## 2019-07-12 DIAGNOSIS — F329 Major depressive disorder, single episode, unspecified: Secondary | ICD-10-CM

## 2019-07-12 DIAGNOSIS — T402X5A Adverse effect of other opioids, initial encounter: Secondary | ICD-10-CM

## 2019-07-12 DIAGNOSIS — K5903 Drug induced constipation: Secondary | ICD-10-CM

## 2019-07-12 DIAGNOSIS — X58XXXD Exposure to other specified factors, subsequent encounter: Secondary | ICD-10-CM

## 2019-07-12 MED ORDER — LIDOCAINE 5 % EX PTCH
2.0000 | MEDICATED_PATCH | CUTANEOUS | Status: DC
  Administered 2019-07-12 – 2019-07-20 (×8): 2 via TRANSDERMAL
  Filled 2019-07-12 (×9): qty 2

## 2019-07-12 NOTE — Progress Notes (Signed)
Subjective: Ms. Lamontagne reports feeling fine today. She had a very stressful day yesterday. She relayed a distressing experience asking for pain medication and reports feeling judged and frustrated. We discussed having staff call us if there are any questions about her pain regime and that she shouldn't have to explain herself. She also had a panic attack last night. She reports prior panic attacks but none during this stay until last night. She has great coping mechanisms including deep breathing which helped. She also received Vistaril which helped some. She reports continued chest pain particularly over the sternum where she has a known fracture. She says it is particularly painful over her sternum to take deep breaths and yawn when her medicine is wearing off. She has no other complaints or concerns today.  Objective:  Vital signs in last 24 hours: Vitals:   07/11/19 0809 07/11/19 1430 07/11/19 2032 07/12/19 0420  BP: 102/74 106/76 110/84 113/82  Pulse:  96 (!) 147 (!) 109  Resp:  18 (!) 23 (!) 22  Temp: 99 F (37.2 C) 97.7 F (36.5 C) 99 F (37.2 C) 98.4 F (36.9 C)  TempSrc: Oral Oral Oral Oral  SpO2: 97% 93% 97% 97%  Weight:    43.7 kg  Height:        Physical Exam  Constitutional: She is oriented to person, place, and time.  Thin appearing female lying comfortably in bed  HENT:  Head: Normocephalic and atraumatic.  Cardiovascular: Regular rhythm and normal heart sounds.  tachycardic  Pulmonary/Chest: Effort normal and breath sounds normal. No respiratory distress. She exhibits tenderness.  Shallow breaths secondary to pain  Abdominal: Bowel sounds are normal. She exhibits no distension. There is no abdominal tenderness.  Neurological: She is alert and oriented to person, place, and time.  Skin: Skin is warm and dry. No rash noted. No erythema.  Nursing note and vitals reviewed.  I/Os: patient had a BM two days ago  Labs: CMP Latest Ref Rng & Units 07/10/2019 07/07/2019  07/04/2019  Glucose 70 - 99 mg/dL 95 99 116(H)  BUN 6 - 20 mg/dL 29(H) 28(H) 29(H)  Creatinine 0.44 - 1.00 mg/dL 0.56 0.61 0.55  Sodium 135 - 145 mmol/L 137 138 138  Potassium 3.5 - 5.1 mmol/L 4.1 4.2 4.2  Chloride 98 - 111 mmol/L 101 99 102  CO2 22 - 32 mmol/L 27 29 26   Calcium 8.9 - 10.3 mg/dL 8.6(L) 8.7(L) 8.1(L)  Total Protein 6.5 - 8.1 g/dL - - -  Total Bilirubin 0.3 - 1.2 mg/dL - - -  Alkaline Phos 38 - 126 U/L - - -  AST 15 - 41 U/L - - -  ALT 0 - 44 U/L - - -   CBC Latest Ref Rng & Units 07/10/2019 07/07/2019 07/04/2019  WBC 4.0 - 10.5 K/uL 8.3 10.7(H) 15.0(H)  Hemoglobin 12.0 - 15.0 g/dL 8.0(L) 8.2(L) 8.5(L)  Hematocrit 36.0 - 46.0 % 26.3(L) 26.5(L) 27.6(L)  Platelets 150 - 400 K/uL 317 375 483(H)   Imaging: no new imaging   Assessment/Plan:  Assessment: Ms. Chisum is a 38 yo F here with MRSA bacteremia and subsequent complications of painful abscesses with resolution of elevated WBC and regression of known septic emboli on imaging.   Plan: Principal Problem:   MRSA bacteremia with suspected IE/painful abscesses -receiving IV vanc for 6 weeks, last day on 07/31 -patient reports continued pain with adequate pain control with current regimen as long as meds received at regular intervals -lidocaine patches helping as  well -pt reports improved physical function with PT which she is hopeful means her infection is regressing    Active Problems:   Opioid-induced constipation -had a BM two days ago and will continue Senokot BID and Movantik    Depression -saw spiritual care -continue buspirone -fu patient sx and complaints  Dispo: Anticipated discharge upon completion of IV antibiotic therapy, last dose 07/31.  Al Decant, MD 07/12/2019, 11:31 AM Pager: [phone]

## 2019-07-13 LAB — CBC
HCT: 30.5 % — ABNORMAL LOW (ref 36.0–46.0)
Hemoglobin: 9.6 g/dL — ABNORMAL LOW (ref 12.0–15.0)
MCH: 31 pg (ref 26.0–34.0)
MCHC: 31.5 g/dL (ref 30.0–36.0)
MCV: 98.4 fL (ref 80.0–100.0)
Platelets: 270 10*3/uL (ref 150–400)
RBC: 3.1 MIL/uL — ABNORMAL LOW (ref 3.87–5.11)
RDW: 18.2 % — ABNORMAL HIGH (ref 11.5–15.5)
WBC: 9.2 10*3/uL (ref 4.0–10.5)
nRBC: 0 % (ref 0.0–0.2)

## 2019-07-13 LAB — BASIC METABOLIC PANEL
Anion gap: 8 (ref 5–15)
BUN: 27 mg/dL — ABNORMAL HIGH (ref 6–20)
CO2: 26 mmol/L (ref 22–32)
Calcium: 8.9 mg/dL (ref 8.9–10.3)
Chloride: 102 mmol/L (ref 98–111)
Creatinine, Ser: 0.7 mg/dL (ref 0.44–1.00)
GFR calc Af Amer: >60 mL/min (ref >60)
GFR calc non Af Amer: >60 mL/min (ref >60)
Glucose, Bld: 124 mg/dL — ABNORMAL HIGH (ref 70–99)
Potassium: 4.2 mmol/L (ref 3.5–5.1)
Sodium: 136 mmol/L (ref 135–145)

## 2019-07-13 MED ORDER — HEPARIN SODIUM (PORCINE) 5000 UNIT/ML IJ SOLN
5000.0000 [IU] | Freq: Three times a day (TID) | INTRAMUSCULAR | Status: DC
  Administered 2019-07-13 – 2019-07-28 (×46): 5000 [IU] via SUBCUTANEOUS
  Filled 2019-07-13 (×45): qty 1

## 2019-07-13 NOTE — Progress Notes (Signed)
Pharmacy Antibiotic Note  Monica Stephens is a 38 y.o. female admitted on 06/14/2019 now with MRSA bacteremia and presumed right sided endocarditis.  Pharmacy has been consulted for Vanco dosing.  ID: Vanc for MRSA bacteremia and presumed right sided endocarditis. Noted patient has septic emboli on CT chest and possible pericarditis - TEE negative for endocarditis. MRI of spine w/ septic emboli and paraspinal abscesses. L axillary abscess tracking along pectoral muscle (drained7/7). She has developed several chest and UE abscesses that have been too small for formal I&D.   -WBC 9.2, Afeb, LA 1.8>1.1, Scr stable and WNL -CT chest (7/2) mild improvement in septic emboli  6/14 Vanc>> (minimum 6 weeks per ID ) 6/14 Cefepime X 1 6/14 Acyclovir X 1  7/4 CTX >> 7/8 d/c'd  6/19 VP/VT 26/13, AUC = 457 on 500mg  q12 >> 1g Q24 for convenience outpt 6/26 VP/VT 27/10, AUC = 458  7/3 VP/VT 31/5: AUC=366.9, 500 mg IV x 1, then increase to 1250mg /24 (not done until 7/6 (next expected AUC 458) 7/10 VP/VT 37/11, AUC = 452   6/14 Bcx x 4/4: staph aureus (BCID MRSA) 6/14 COVID: neg  6/14 Ucx: MRSA 6/16 Wound: MRSA 6/17 Bcx: MRSA 6/20 Bcx: NGF 6/24 Pleual fluid: NGF 6/24 Pleural fluid fungus: NGTD 6/24 COVID: neg  6/29: Bcx: NGTD 7/3 Abscess: MRSA   Plan: - Vancomycin 1250 mg IV q 24h through 7/30 - Weekly levels (next 7/17)    Height: 5\' 2"  (157.5 cm) Weight: 96 lb 11.2 oz (43.9 kg) IBW/kg (Calculated) : 50.1  Temp (24hrs), Avg:98.6 F (37 C), Min:98.2 F (36.8 C), Max:98.9 F (37.2 C)  Recent Labs  Lab 07/07/19 0500 07/09/19 1348 07/10/19 0414 07/10/19 0415 07/13/19 0728  WBC 10.7*  --  8.3  --  9.2  CREATININE 0.61  --  0.56  --  0.70  VANCOTROUGH  --   --   --  11*  --   VANCOPEAK  --  37  --   --   --     Estimated Creatinine Clearance: 67.4 mL/min (by C-G formula based on SCr of 0.7 mg/dL).    No Known Allergies  . Laraina Sulton S. Alford Highland, PharmD, BCPS Clinical Staff  Pharmacist Eilene Ghazi Stillinger 07/13/2019 1:47 PM

## 2019-07-13 NOTE — Progress Notes (Signed)
Subjective: Monica Stephens is doing well today. She reports a better day yesterday than the day before. Her mom dropped off some [employer], pajamas, a book and pictures of her kids which she really appreciated. She was also able to wash her hair with real shampoo she said. She is still in pain but thinks it is somewhat improved and didn't require all the oxycodone she was prescribed prn yesterday. She states she had another BM yesterday and has no abdominal pain. She requested that she be given heparin rather than lovenox because the shot is very painful. She has no other complaints or concerns.  Objective:  Vital signs in last 24 hours: Vitals:   07/12/19 0420 07/12/19 1400 07/12/19 2106 07/13/19 0500  BP: 113/82 114/85 106/78 104/78  Pulse: (!) 109 (!) 103 98 (!) 101  Resp: (!) 22 (!) 21 (!) 24 18  Temp: 98.4 F (36.9 C) 98.9 F (37.2 C) 98.7 F (37.1 C) 98.2 F (36.8 C)  TempSrc: Oral Oral Oral Oral  SpO2: 97% 97% 97% 97%  Weight: 43.7 kg   43.9 kg  Height:        Physical Exam  Constitutional: She is oriented to person, place, and time and well-developed, well-nourished, and in no distress.  Thin appearing female lying comfortably in bed  HENT:  Head: Normocephalic and atraumatic.  Cardiovascular: Regular rhythm and normal heart sounds.  tachycardic  Pulmonary/Chest: Effort normal and breath sounds normal. No respiratory distress. She exhibits tenderness.  Shallow breaths secondary to pain  Abdominal: Bowel sounds are normal. She exhibits no distension. There is no abdominal tenderness.  Musculoskeletal:     Comments: Slight left axillary/UE swelling much decreased from prior exams  Neurological: She is alert and oriented to person, place, and time.  Skin: Skin is warm and dry. No rash noted. No erythema.  Nursing note and vitals reviewed.  I/Os: patient had a BM yesterday  Labs: CMP Latest Ref Rng & Units 07/10/2019 07/07/2019 07/04/2019  Glucose 70 - 99 mg/dL 95 99 116(H)   BUN 6 - 20 mg/dL 29(H) 28(H) 29(H)  Creatinine 0.44 - 1.00 mg/dL 0.56 0.61 0.55  Sodium 135 - 145 mmol/L 137 138 138  Potassium 3.5 - 5.1 mmol/L 4.1 4.2 4.2  Chloride 98 - 111 mmol/L 101 99 102  CO2 22 - 32 mmol/L 27 29 26   Calcium 8.9 - 10.3 mg/dL 8.6(L) 8.7(L) 8.1(L)  Total Protein 6.5 - 8.1 g/dL - - -  Total Bilirubin 0.3 - 1.2 mg/dL - - -  Alkaline Phos 38 - 126 U/L - - -  AST 15 - 41 U/L - - -  ALT 0 - 44 U/L - - -   CBC Latest Ref Rng & Units 07/10/2019 07/07/2019 07/04/2019  WBC 4.0 - 10.5 K/uL 8.3 10.7(H) 15.0(H)  Hemoglobin 12.0 - 15.0 g/dL 8.0(L) 8.2(L) 8.5(L)  Hematocrit 36.0 - 46.0 % 26.3(L) 26.5(L) 27.6(L)  Platelets 150 - 400 K/uL 317 375 483(H)   Imaging: no new imaging   Assessment/Plan:  Assessment: Ms. Holcomb is a 38 yo F here with MRSA bacteremia and subsequent complications of painful abscesses with resolution of elevated WBC and regression of known septic emboli on imaging as well as symptomatic improvement.   Plan: Principal Problem:   MRSA bacteremia with suspected IE/painful abscesses -receiving IV vanc for 6 weeks, last day on 07/31 -patient reports continued pain with adequate pain control with current regimen; did not require all doses of prn oxycodone yestrday -lidocaine patches  helping as well -pt reports improved physical function with PT which she is hopeful means her infection is regressing   -decreased left axillary swelling at known site of septic abscess  Active Problems:   Opioid-induced constipation -had a BM yesterday and will continue Senokot BID (patient hasn't taken in a few days) and Movantik    Depression -saw spiritual care a few days ago -continue buspirone -fu patient sx and complaints    DVT prophylaxis -switched from lovenox to heparin  Dispo: Anticipated discharge upon completion of IV antibiotic therapy, last dose 07/31.  Al Decant, MD 07/13/2019, 6:09 AM Pager: [phone]

## 2019-07-14 DIAGNOSIS — Z87440 Personal history of urinary (tract) infections: Secondary | ICD-10-CM

## 2019-07-14 LAB — URINALYSIS, ROUTINE W REFLEX MICROSCOPIC
Bilirubin Urine: NEGATIVE
Glucose, UA: NEGATIVE mg/dL
Hgb urine dipstick: NEGATIVE
Ketones, ur: NEGATIVE mg/dL
Leukocytes,Ua: NEGATIVE
Nitrite: NEGATIVE
Protein, ur: NEGATIVE mg/dL
Specific Gravity, Urine: 1.017 (ref 1.005–1.030)
pH: 7 (ref 5.0–8.0)

## 2019-07-14 MED ORDER — ALTEPLASE 2 MG IJ SOLR
2.0000 mg | Freq: Once | INTRAMUSCULAR | Status: AC
  Administered 2019-07-14: 2 mg
  Filled 2019-07-14: qty 2

## 2019-07-14 MED ORDER — IBUPROFEN 400 MG PO TABS: 400.0000 mg | ORAL_TABLET | Freq: Three times a day (TID) | ORAL | Status: DC | PRN
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
## 2019-07-14 NOTE — Progress Notes (Signed)
Pt states her chest pain is worse than it has been recently.  Pt describes increased sharp pain with squeezing sensation when she talks or moves around.  Will continue to monitor.

## 2019-07-14 NOTE — Progress Notes (Signed)
Subjective: Monica Stephens is doing well today. She reports some increased pain associated with increased movement. She states she is having regular BMs. She reports a return of her dysuria. She has no other complaints or concerns.  Objective:  Vital signs in last 24 hours: Vitals:   07/13/19 2013 07/14/19 0500 07/14/19 0609 07/14/19 1000  BP: 118/76  104/68 101/67  Pulse: (!) 102  (!) 111 96  Resp: 16  20   Temp: 98.2 F (36.8 C)  98.3 F (36.8 C) 98 F (36.7 C)  TempSrc: Axillary  Oral Oral  SpO2: 98%  94% 95%  Weight:  43.5 kg    Height:        Physical Exam  Constitutional: She is oriented to person, place, and time and well-developed, well-nourished, and in no distress.  Thin appearing female lying comfortably in bed  HENT:  Head: Normocephalic and atraumatic.  Cardiovascular: Regular rhythm and normal heart sounds.  tachycardic  Pulmonary/Chest: Effort normal and breath sounds normal. No respiratory distress. She exhibits tenderness.  Shallow breaths secondary to pain  Abdominal: Bowel sounds are normal. She exhibits no distension. There is no abdominal tenderness.  Neurological: She is alert and oriented to person, place, and time.  Skin: Skin is warm and dry. No rash noted. No erythema.  Nursing note and vitals reviewed.  I/Os: regular BMs  Labs: CMP Latest Ref Rng & Units 07/13/2019 07/10/2019 07/07/2019  Glucose 70 - 99 mg/dL 124(H) 95 99  BUN 6 - 20 mg/dL 27(H) 29(H) 28(H)  Creatinine 0.44 - 1.00 mg/dL 0.70 0.56 0.61  Sodium 135 - 145 mmol/L 136 137 138  Potassium 3.5 - 5.1 mmol/L 4.2 4.1 4.2  Chloride 98 - 111 mmol/L 102 101 99  CO2 22 - 32 mmol/L 26 27 29   Calcium 8.9 - 10.3 mg/dL 8.9 8.6(L) 8.7(L)  Total Protein 6.5 - 8.1 g/dL - - -  Total Bilirubin 0.3 - 1.2 mg/dL - - -  Alkaline Phos 38 - 126 U/L - - -  AST 15 - 41 U/L - - -  ALT 0 - 44 U/L - - -   CBC Latest Ref Rng & Units 07/13/2019 07/10/2019 07/07/2019  WBC 4.0 - 10.5 K/uL 9.2 8.3 10.7(H)  Hemoglobin  12.0 - 15.0 g/dL 9.6(L) 8.0(L) 8.2(L)  Hematocrit 36.0 - 46.0 % 30.5(L) 26.3(L) 26.5(L)  Platelets 150 - 400 K/uL 270 317 375   Imaging: no new imaging   Assessment/Plan:  Assessment: Monica Stephens is a 38 yo F here with MRSA bacteremia and subsequent complications of painful abscesses with resolution of elevated WBC and regression of known septic emboli on imaging as well as symptomatic improvement.   Plan: Principal Problem:   MRSA bacteremia with suspected IE/painful abscesses -receiving IV vanc for 6 weeks, last day on 07/31 -patient reports continued pain that is somewhat increased with increased physical activity -will monitor and if it worsens will reimage -plan to discuss tomorrow the need for reduction of IV opioids prior to dc   Active Problems:   Opioid-induced constipation -having regular BMs with Movantik, not taking Senna as too many meds upset ehr stomach     Depression -saw spiritual care a few days ago -continue buspirone -plan to discus with her the addition of an SNRI for depression and additional pain control     Dysuria -pt treated for a UTI last week and completed antibiotics with sx improvement -return of dysuria yesterday with painful burning at the end of urination -urinalysis  today  Dispo: Anticipated discharge upon completion of IV antibiotic therapy, last dose 07/31.  Al Decant, MD 07/14/2019, 10:30 AM Pager: [phone]

## 2019-07-14 NOTE — Progress Notes (Signed)
Pt was walking back from the bathroom and had an unwitnessed fall backwards landing on her bottom.  Nurse was outside room and heard pt go down.  Pt expressed no pain or injury related to the fall.  Vital signs were obtained and were WNL.  Pt was able to ambulate back to her bed and is resting comfortably at this time. MD paged.  Will continue to monitor.

## 2019-07-14 NOTE — Progress Notes (Signed)
Initial Nutrition Assessment  DOCUMENTATION CODES:   Not applicable  INTERVENTION:    Continue Ensure Enlive po TID, each supplement provides 350 kcal and 20 grams of protein  Continue MVI daily  NUTRITION DIAGNOSIS:   Increased nutrient needs related to acute illness as evidenced by estimated needs.  Ongoing  GOAL:   Patient will meet greater than or equal to 90% of their needs  Progressing  MONITOR:   PO intake, Supplement acceptance, Skin, I & O's, Labs, Weight trends  REASON FOR ASSESSMENT:   Consult Assessment of nutrition requirement/status  ASSESSMENT:   Patient with PMH significant for bipolar disorder, thyroid cancer s/p thyroidectomy, and polysubstance abuse. Presents this admission with right PE with concern for developing empyema.   6/24- thoracentesis  I&D canceled after reviewing imaging.   Spoke with pt via phone. Reports appetite is better this week. Feels relief after BMs. Meal completions charted as 50-100% for her last 8 meals. She is taking Ensure BID and would like to continue. RD to increase to TID.    Admission weight: 53.4 kg Current weight: 43.5 kg (will continue to monitor).   Generalized +1 edema continues.   Drips: IV abx Medications: folic acid, MVI with minerals, senokot Labs: CBG 95-124   Diet Order:   Diet Order            Diet regular Room service appropriate? Yes; Fluid consistency: Thin  Diet effective now              EDUCATION NEEDS:   Education needs have been addressed  Skin:  Skin Assessment: Skin Integrity Issues: Skin Integrity Issues:: Other (Comment) Other: blister-mid chest  Last BM:  7/12  Height:   Ht Readings from Last 1 Encounters:  06/14/19 5\' 2"  (1.575 m)    Weight:   Wt Readings from Last 1 Encounters:  07/14/19 43.5 kg    Ideal Body Weight:  50 kg  BMI:  Body mass index is 17.52 kg/m.  Estimated Nutritional Needs:   Kcal:  1600-1800 kcal  Protein:  80-95 grams  Fluid:   >/= 1.6 L/day   Monica Stephens RD, LDN Clinical Nutrition Pager # - [phone]
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
## 2019-07-15 DIAGNOSIS — Z590 Homelessness: Secondary | ICD-10-CM

## 2019-07-15 MED ORDER — DULOXETINE HCL 30 MG PO CPEP
30.0000 mg | ORAL_CAPSULE | Freq: Every day | ORAL | Status: DC
  Administered 2019-07-15 – 2019-07-22 (×8): 30 mg via ORAL
  Filled 2019-07-15 (×8): qty 1

## 2019-07-15 MED ORDER — PHENAZOPYRIDINE HCL 100 MG PO TABS
100.0000 mg | ORAL_TABLET | Freq: Three times a day (TID) | ORAL | Status: DC
  Administered 2019-07-15 – 2019-07-17 (×7): 100 mg via ORAL
  Filled 2019-07-15 (×11): qty 1

## 2019-07-15 NOTE — Progress Notes (Addendum)
Subjective: Monica Stephens is doing well today. She reports continued pain in her chest associated with increased physical activity. She worked with PT OT yesterday who note improvement in her functioning. Currently they still recommend SNF but she may progress to be able to go home with home health. However, patient is homeless and does not have anywhere to go afterwards.   We discussed that she is improving physically and her antibiotics are working and that we need to begin to prepare for discharge. She understand that she can't go out on iv pain medications and that she will have some pain from this for a while. We discussed starting an SNRI for both her depression and pain and she was amenable. She is going to think about suboxone closer to discharge. She used to take it and reports not being able to afford it and the withdrawal from that was worse than from heroin so she is somewhat unsure. We discussed that she could follow up with our clinic where we have discounts for patients. She reports that her parents don't understand suboxone and think it is just as bad as heroin and told us we could talk to them about it.   Talked to mom who stated she is supportive of medical management and is concerned about where patient will go at discharge. Mom is going to talk more to Monica Stephens and if she is agreeable, begin looking into rehabilitation facilities. The mom does not want her discharged to a homeless shelter but based on discussions with family and in the best interest of the patient's children, the patient's mother does not think it would be best for her to stay with them.   Monica Stephens states she is having regular BMs and continues to have dysuria. She has no other complaints or concerns.  Objective:  Vital signs in last 24 hours: Vitals:   07/14/19 1030 07/14/19 1100 07/14/19 1130 07/14/19 2000  BP:    116/85  Pulse:    89  Resp: 19 18 20 18   Temp:    97.9 F (36.6 C)  TempSrc:    Oral  SpO2:     98%  Weight:      Height:        Physical Exam  Constitutional: She is oriented to person, place, and time and well-developed, well-nourished, and in no distress.  Thin appearing female lying comfortably in bed  HENT:  Head: Normocephalic and atraumatic.  Cardiovascular: Regular rhythm and normal heart sounds.  tachycardic  Pulmonary/Chest: Breath sounds normal. She exhibits tenderness.  Abdominal: Bowel sounds are normal. She exhibits no distension. There is no abdominal tenderness.  Neurological: She is alert and oriented to person, place, and time.  Skin: Skin is warm and dry. No rash noted. No erythema.  Nursing note and vitals reviewed.  I/Os: regular BMs  Labs: CMP Latest Ref Rng & Units 07/13/2019 07/10/2019 07/07/2019  Glucose 70 - 99 mg/dL 124(H) 95 99  BUN 6 - 20 mg/dL 27(H) 29(H) 28(H)  Creatinine 0.44 - 1.00 mg/dL 0.70 0.56 0.61  Sodium 135 - 145 mmol/L 136 137 138  Potassium 3.5 - 5.1 mmol/L 4.2 4.1 4.2  Chloride 98 - 111 mmol/L 102 101 99  CO2 22 - 32 mmol/L 26 27 29   Calcium 8.9 - 10.3 mg/dL 8.9 8.6(L) 8.7(L)  Total Protein 6.5 - 8.1 g/dL - - -  Total Bilirubin 0.3 - 1.2 mg/dL - - -  Alkaline Phos 38 - 126 U/L - - -  AST 15 - 41 U/L - - -  ALT 0 - 44 U/L - - -   CBC Latest Ref Rng & Units 07/13/2019 07/10/2019 07/07/2019  WBC 4.0 - 10.5 K/uL 9.2 8.3 10.7(H)  Hemoglobin 12.0 - 15.0 g/dL 9.6(L) 8.0(L) 8.2(L)  Hematocrit 36.0 - 46.0 % 30.5(L) 26.3(L) 26.5(L)  Platelets 150 - 400 K/uL 270 317 375   Imaging: no new imaging   Assessment/Plan:  Assessment: Monica Stephens is a 38 yo F here with MRSA bacteremia and subsequent complications of painful abscesses with resolution of elevated WBC and regression of known septic emboli on imaging as well as symptomatic improvement.   Plan: Principal Problem:   MRSA bacteremia with suspected IE/painful abscesses -receiving IV vanc for 6 weeks, last day on 07/31 -patient reports continued pain that is somewhat increased with  increased physical activity -will monitor and if it worsens will reimage -discussed need for reduction of IV opioids prior to dc and pt understands and in agreement -starting SNRI cymbalta 30 mg daily for depression/pain  Active Problems:   Opioid-induced constipation -having regular BMs with Movantik    Depression -continue buspirone -discussed the addition of an SNRI for depression and additional pain control ad she was amenable -starting cymbalta 30 mg daily with plan to increase in a week    Dysuria -pt treated for a UTI last week and completed antibiotics with sx improvement -return of dysuria with normal urinalysis -ordered pyridium   Dispo: Anticipated discharge upon completion of IV antibiotic therapy, last dose 07/31.  Al Decant, MD 07/15/2019, 6:02 AM Pager: [phone]

## 2019-07-15 NOTE — Plan of Care (Signed)
Will continue to monitor.

## 2019-07-16 MED ORDER — TRAZODONE HCL 100 MG PO TABS
100.0000 mg | ORAL_TABLET | Freq: Every day | ORAL | Status: DC
  Administered 2019-07-16 – 2019-07-30 (×15): 100 mg via ORAL
  Filled 2019-07-16 (×15): qty 1

## 2019-07-16 NOTE — Progress Notes (Signed)
PT Cancellation Note  Patient Details Name: Monica Stephens MRN: [id number] DOB: 1981-10-13   Cancelled Treatment:    Reason Eval/Treat Not Completed: Fatigue/lethargy limiting ability to participate.  Reports she is getting up with nursing to walk and asked to wait until tomorrow.  Follow up at another time.   Ramond Dial 07/16/2019, 3:43 PM   Mee Hives, PT MS Acute Rehab Dept. Number: [place]

## 2019-07-16 NOTE — Progress Notes (Signed)
Pharmacy Antibiotic Note  Monica Stephens is a 38 y.o. female admitted on 06/14/2019 now with MRSA bacteremia and presumed right sided endocarditis.  Pharmacy has been consulted for Vanco dosing.  ID: Vanc for MRSA bacteremia and presumed right sided endocarditis. Noted patient has septic emboli on CT chest and possible pericarditis - TEE negative for endocarditis. MRI of spine w/ septic emboli and paraspinal abscesses. L axillary abscess tracking along pectoral muscle (drained7/7). She has developed several chest and UE abscesses that have been too small for formal I&D.   -WBC 9.2, Afeb, LA 1.8>1.1, Scr stable and WNL -CT chest (7/2) mild improvement in septic emboli  6/14 Vanc>> (minimum 6 weeks per ID ) 6/14 Cefepime X 1 6/14 Acyclovir X 1  7/4 CTX >> 7/8 d/c'd  6/19 VP/VT 26/13, AUC = 457 on 500mg  q12 >> 1g Q24 for convenience outpt 6/26 VP/VT 27/10, AUC = 458  7/3 VP/VT 31/5: AUC=366.9, 500 mg IV x 1, then increase to 1250mg /24 (not done until 7/6 (next expected AUC 458) 7/10 VP/VT 37/11, AUC = 452  6/14 Bcx x 4/4: staph aureus (BCID MRSA) 6/14 COVID: neg  6/14 Ucx: MRSA 6/16 Wound: MRSA 6/17 Bcx: MRSA 6/20 Bcx: NGF 6/24 Pleual fluid: NGF 6/24 Pleural fluid fungus: NGTD 6/24 COVID: neg  6/29: Bcx: NGTD 7/3 Abscess: MRSA   Plan: - Vancomycin 1250 mg IV q 24h through 7/31 - 7/17: VP 1330 - 7/18: VT 1000 - Monitor renal function    Height: 5\' 2"  (157.5 cm) Weight: 96 lb 3.2 oz (43.6 kg) IBW/kg (Calculated) : 50.1  Temp (24hrs), Avg:98 F (36.7 C), Min:97.8 F (36.6 C), Max:98.1 F (36.7 C)  Recent Labs  Lab 07/09/19 1348 07/10/19 0414 07/10/19 0415 07/13/19 0728  WBC  --  8.3  --  9.2  CREATININE  --  0.56  --  0.70  VANCOTROUGH  --   --  11*  --   VANCOPEAK 37  --   --   --     Estimated Creatinine Clearance: 66.9 mL/min (by C-G formula based on SCr of 0.7 mg/dL).    No Known Allergies  Lorel Monaco, PharmD PGY1 Ambulatory Care Resident [employer] #  [phone]

## 2019-07-16 NOTE — Progress Notes (Signed)
   Subjective: [name] is doing well today. She is interested in starting suboxone before she leaves. She is also interested in getting some numbers of folks in local AA or NA groups to have some people to talk to about getting sober. She has had decreased dysuria since starting pyridium. Pt reports some right medial thumb numbness, perhaps since admission, with no change in function. Pt also complains of trouble sleeping and says she is prescribed trazodone outpatient and would like some. She has no other complaints or concerns.  Objective:  Vital signs in last 24 hours: Vitals:   07/15/19 1930 07/15/19 2009 07/16/19 0359 07/16/19 0444  BP: 100/66 104/75 100/66   Pulse: (!) 103 (!) 113 100   Resp: 20 16 15 19   Temp:  97.8 F (36.6 C) 98.1 F (36.7 C)   TempSrc:  Oral Oral   SpO2: 100% 96% 96%   Weight:    43.6 kg  Height:        Physical Exam  Constitutional: She is oriented to person, place, and time and well-developed, well-nourished, and in no distress.  Thin appearing female lying comfortably in bed  HENT:  Head: Normocephalic and atraumatic.  Cardiovascular: Regular rhythm, normal heart sounds and intact distal pulses.  tachycardic  Pulmonary/Chest: Breath sounds normal.  Abdominal: Soft. Bowel sounds are normal. She exhibits distension. There is no abdominal tenderness.  Neurological: She is alert and oriented to person, place, and time.  Right medial thumb numbness, good strength and function  Skin: Skin is warm and dry. No rash noted. No erythema.  Nursing note and vitals reviewed.  I/Os: regular BMs per patient  Assessment/Plan:  Assessment: [name] is a 2 yo F here with MRSA bacteremia and subsequent complications of painful abscesses with resolution of elevated WBC and regression of known septic emboli on imaging as well as symptomatic improvement.   Plan: Principal Problem:   MRSA bacteremia with suspected IE/painful abscesses -receiving IV vanc for 6  weeks, last day on 07/31 -patient does not report worsening pain today -continued our discussion of need to reduce IV pain medication usage prior to discharge and possible initiation of suboxone treatment  Active Problems:   Opioid-induced constipation -having regular BMs with Movantik    Depression -continue buspirone -started cymbalta 30 mg daily yesterday with plan to increase on July 22    Dysuria -pt treated for a UTI last week and completed antibiotics with sx improvement -return of dysuria with normal urinalysis, treated with pyridium  -decreased dysuria noted today    Insomnia -patient having trouble sleeping which was originally treated with ramelteon but patient reports that is no longer working  -patient prescribed trazodone outpatient which we ordered for her  Dispo: Anticipated discharge upon completion of IV antibiotic therapy, last dose 07/31.  Al Decant, MD 07/16/2019, 12:40 PM Pager: [phone]

## 2019-07-17 LAB — CBC
HCT: 27.9 % — ABNORMAL LOW (ref 36.0–46.0)
Hemoglobin: 8.3 g/dL — ABNORMAL LOW (ref 12.0–15.0)
MCH: 30 pg (ref 26.0–34.0)
MCHC: 29.7 g/dL — ABNORMAL LOW (ref 30.0–36.0)
MCV: 100.7 fL — ABNORMAL HIGH (ref 80.0–100.0)
Platelets: 292 10*3/uL (ref 150–400)
RBC: 2.77 MIL/uL — ABNORMAL LOW (ref 3.87–5.11)
RDW: 17.5 % — ABNORMAL HIGH (ref 11.5–15.5)
WBC: 6.3 10*3/uL (ref 4.0–10.5)
nRBC: 0 % (ref 0.0–0.2)

## 2019-07-17 LAB — BASIC METABOLIC PANEL
Anion gap: 7 (ref 5–15)
BUN: 25 mg/dL — ABNORMAL HIGH (ref 6–20)
CO2: 27 mmol/L (ref 22–32)
Calcium: 8.8 mg/dL — ABNORMAL LOW (ref 8.9–10.3)
Chloride: 102 mmol/L (ref 98–111)
Creatinine, Ser: 0.56 mg/dL (ref 0.44–1.00)
GFR calc Af Amer: >60 mL/min (ref >60)
GFR calc non Af Amer: >60 mL/min (ref >60)
Glucose, Bld: 87 mg/dL (ref 70–99)
Potassium: 4 mmol/L (ref 3.5–5.1)
Sodium: 136 mmol/L (ref 135–145)

## 2019-07-17 LAB — VANCOMYCIN, PEAK: Vancomycin Pk: 33 ug/mL (ref 30–40)

## 2019-07-17 MED ORDER — KETOROLAC TROMETHAMINE 15 MG/ML IJ SOLN
15.0000 mg | Freq: Four times a day (QID) | INTRAMUSCULAR | Status: AC | PRN
  Administered 2019-07-17 – 2019-07-18 (×3): 15 mg via INTRAVENOUS
  Filled 2019-07-17 (×4): qty 1

## 2019-07-17 MED ORDER — HYDROMORPHONE HCL 1 MG/ML IJ SOLN
1.0000 mg | INTRAMUSCULAR | Status: DC | PRN
  Administered 2019-07-17 – 2019-07-18 (×6): 1 mg via INTRAVENOUS
  Filled 2019-07-17 (×6): qty 1
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
## 2019-07-17 NOTE — Progress Notes (Signed)
Pt c/o mid, upper chest tightness and pain, increased from before, and tightness and pain in her right ribcage/armpit area.  This pain/tightness gets worse when she talks or moves and she can feel her heart going faster.  Pain meds given.  Attending MD paged and made aware.  Ordered EKG done and placed on the chart.  Will continue to monitor.
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
## 2019-07-18 LAB — VANCOMYCIN, TROUGH: Vancomycin Tr: 8 ug/mL — ABNORMAL LOW (ref 15–20)

## 2019-07-18 MED ORDER — OXYCODONE HCL 5 MG PO TABS
10.0000 mg | ORAL_TABLET | Freq: Two times a day (BID) | ORAL | Status: DC | PRN
  Administered 2019-07-18: 10 mg via ORAL
  Filled 2019-07-18: qty 2

## 2019-07-18 MED ORDER — HYDROMORPHONE HCL 1 MG/ML IJ SOLN
1.5000 mg | INTRAMUSCULAR | Status: DC | PRN
  Administered 2019-07-18 – 2019-07-23 (×30): 1.5 mg via INTRAVENOUS
  Filled 2019-07-18 (×31): qty 2

## 2019-07-18 NOTE — Progress Notes (Signed)
Pharmacy Antibiotic Note  Monica Stephens is a 38 y.o. female admitted on 06/14/2019 now with MRSA bacteremia and presumed right sided endocarditis.  Pharmacy has been consulted for Vanco dosing. Afebrile; CBC and Scr stable.   7/17 VP: 33 7/18 VT: 8 AUC = 486.8   Plan: - Vancomycin 1250 mg IV q 24h through 7/31 - Monitor renal function   Height: 5\' 2"  (157.5 cm) Weight: 94 lb 12.8 oz (43 kg) IBW/kg (Calculated) : 50.1  Temp (24hrs), Avg:98.1 F (36.7 C), Min:97.8 F (36.6 C), Max:98.4 F (36.9 C)  Recent Labs  Lab 07/13/19 0728 07/17/19 0450 07/17/19 1306 07/18/19 1059  WBC 9.2 6.3  --   --   CREATININE 0.70 0.56  --   --   VANCOTROUGH  --   --   --  8*  VANCOPEAK  --   --  33  --     Estimated Creatinine Clearance: 66 mL/min (by C-G formula based on SCr of 0.56 mg/dL).    No Known Allergies  Antimicrobials this admission: 6/14 Vanc>> (minimum 6 weeks per ID ) 6/14 Cefepime X 1 6/14 Acyclovir X 1  7/4 CTX >> 7/8 d/c'd  Microbiology results: 6/14 Bcx x 4/4: staph aureus (BCID MRSA) 6/14 COVID: neg  6/14 Ucx: MRSA 6/16 Wound: MRSA 6/17 Bcx: MRSA 6/20 Bcx: NGF 6/24 Pleual fluid: NGF 6/24 Pleural fluid fungus: NGTD 6/24 COVID: neg  6/29: Bcx: NGTD 7/3 Abscess: MRSA  Thank you for allowing pharmacy to be a part of this patient's care.  Lorel Monaco, PharmD PGY1 Ambulatory Care Resident [employer] # [phone]

## 2019-07-18 NOTE — Progress Notes (Signed)
   Subjective:  Chest pain 9/10 overnight after tapering Dilaudid. EKG negative. Feels she cannot take a deep breath and asking for a change in her pain regimen.   Objective:  Vital signs in last 24 hours: Vitals:   07/18/19 0823 07/18/19 1216 07/18/19 1218 07/18/19 1719  BP: 102/69 105/71 105/71 115/83  Pulse:  95 (!) 58 (!) 101  Resp: 15  12 20   Temp: 98.2 F (36.8 C) 98.4 F (36.9 C)  98 F (36.7 C)  TempSrc: Oral Oral  Oral  SpO2:   90% 94%  Weight:      Height:       General: thin, chronically-ill appearing female in no acute distress  CV: RRR, no mrg  Pulm: comfortable on RA  Derm: violaceous rash below R breast, see pictured in media tab   Assessment/Plan:  Principal Problem:   MRSA bacteremia Active Problems:   Opioid overdose (HCC)   Septic pulmonary embolism, bilateral    Suspected endocarditis   Active intravenous drug use   Pericardial effusion   Rash/skin eruption   Abscess of bursa of left shoulder extending into chest and upper arm    Intravenous drug abuse ([place])   # MRSA bacteremia: Remains hemodynamically stable.  Plan to continue IV vancomycin for total 6 weeks with last day being on 7/31. Will continue working on pain regimen taper as below.   # OUD: Dilaudid decreased 1.5 -> 1 mg every 4 hours as needed yesterday.  States it did not work for her and she experience increased chest pain that was causing shortness of breath.  I increased Dilaudid to 1.5 mg every 4 as needed and decreased oxycodone to 10 twice daily PRN. Will continue to work on taper with goal of switching to Suboxone for OUD treatment.  Dispo: Pending completion of IV antibiotics on 7/31.   Welford Roche, MD 07/18/2019, 5:31 PM Pager: [phone]

## 2019-07-19 MED ORDER — METOCLOPRAMIDE HCL 5 MG/ML IJ SOLN
10.0000 mg | Freq: Three times a day (TID) | INTRAMUSCULAR | Status: DC | PRN
  Administered 2019-07-19 – 2019-07-30 (×8): 10 mg via INTRAVENOUS
  Filled 2019-07-19 (×8): qty 2

## 2019-07-19 NOTE — Progress Notes (Signed)
   Subjective:  No acute events overnight.  Complaining of ongoing nausea this morning that is limiting p.o. intake.  Has tried Phenergan which was initially helping but not anymore.  Objective:  Vital signs in last 24 hours: Vitals:   07/18/19 1719 07/18/19 2018 07/19/19 0326 07/19/19 0437  BP: 115/83 116/71 118/72   Pulse: (!) 101 (!) 106 (!) 106   Resp: 20 (!) 28 18 19   Temp: 98 F (36.7 C) 98.7 F (37.1 C) 98.4 F (36.9 C)   TempSrc: Oral Oral Oral   SpO2: 94% 94% 92%   Weight:    42.8 kg  Height:       General: Thin female sitting in bed in no acute distress CV: Regular rate and rhythm without murmurs, tender of her chest wall Pulm: Clear to auscultation bilaterally Derm: Violaceous rash over her sternum and below right breast unchanged from yesterday  Assessment/Plan:  Principal Problem:   MRSA bacteremia Active Problems:   Opioid overdose (HCC)   Septic pulmonary embolism, bilateral    Suspected endocarditis   Active intravenous drug use   Pericardial effusion   Rash/skin eruption   Abscess of bursa of left shoulder extending into chest and upper arm    Intravenous drug abuse ([place])    # MRSA bacteremia: Doing well and hemodynamically stable.  Continuing with IV 6 weeks of IV vancomycin to be finished on 7/31.  Tapering pain regimen as below. Social work following for placement at the time of discharge.  # OUD: Feeling better after adjustment to pain regimen yesterday. Will continue same regimen today with plan to discontinue oxycodone tomorrow.   Dispo: Pending completion of IV antibiotics on 7/31.   Welford Roche, MD 07/19/2019, 6:51 AM Pager: [phone]

## 2019-07-19 NOTE — Plan of Care (Signed)

## 2019-07-20 LAB — CBC
HCT: 26.7 % — ABNORMAL LOW (ref 36.0–46.0)
Hemoglobin: 8.2 g/dL — ABNORMAL LOW (ref 12.0–15.0)
MCH: 30.5 pg (ref 26.0–34.0)
MCHC: 30.7 g/dL (ref 30.0–36.0)
MCV: 99.3 fL (ref 80.0–100.0)
Platelets: 303 10*3/uL (ref 150–400)
RBC: 2.69 MIL/uL — ABNORMAL LOW (ref 3.87–5.11)
RDW: 16.9 % — ABNORMAL HIGH (ref 11.5–15.5)
WBC: 5.8 10*3/uL (ref 4.0–10.5)
nRBC: 0 % (ref 0.0–0.2)

## 2019-07-20 LAB — BASIC METABOLIC PANEL
Anion gap: 9 (ref 5–15)
BUN: 28 mg/dL — ABNORMAL HIGH (ref 6–20)
CO2: 26 mmol/L (ref 22–32)
Calcium: 8.6 mg/dL — ABNORMAL LOW (ref 8.9–10.3)
Chloride: 100 mmol/L (ref 98–111)
Creatinine, Ser: 0.63 mg/dL (ref 0.44–1.00)
GFR calc Af Amer: >60 mL/min (ref >60)
GFR calc non Af Amer: >60 mL/min (ref >60)
Glucose, Bld: 110 mg/dL — ABNORMAL HIGH (ref 70–99)
Potassium: 4 mmol/L (ref 3.5–5.1)
Sodium: 135 mmol/L (ref 135–145)

## 2019-07-20 MED ORDER — LIDOCAINE 5 % EX PTCH
1.0000 | MEDICATED_PATCH | CUTANEOUS | Status: DC
  Administered 2019-07-21 – 2019-07-30 (×9): 1 via TRANSDERMAL
  Filled 2019-07-20 (×10): qty 1

## 2019-07-20 MED ORDER — ALTEPLASE 2 MG IJ SOLR
2.0000 mg | Freq: Once | INTRAMUSCULAR | Status: AC
  Administered 2019-07-20: 2 mg
  Filled 2019-07-20: qty 2

## 2019-07-20 NOTE — Plan of Care (Signed)

## 2019-07-20 NOTE — Progress Notes (Addendum)
In to give pt pain meds and she was complaining of new onset of pain related to echocardiogram that was just completed about 8min prior.  She noticed a small, painful raised spot on her mid-left chest wall about the size of a quarter. Pain onset during echo and noticed the raised area following the scan. States it continues to be very painful.  Pain meds administered and MD team notified.  Will continue to monitor.
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
## 2019-07-20 NOTE — Progress Notes (Signed)
   Subjective:  No acute events overnight. Nausea is improved from yesterday but still present somewhat. She ordered breakfast and will see how she tolerates it. She was able to shower and wash her own hair yesterday. She has no other complaints or concerns.  Objective:  Vital signs in last 24 hours: Vitals:   07/19/19 0437 07/19/19 1234 07/19/19 2032 07/20/19 0447  BP:  90/72 106/73 104/75  Pulse:   (!) 107 97  Resp: 19 17 (!) 23 14  Temp:  98 F (36.7 C) 98 F (36.7 C) 98.2 F (36.8 C)  TempSrc:  Oral Oral Oral  SpO2:   94% 95%  Weight: 42.8 kg   42 kg  Height:       General: Thin female sitting in bed in no acute distress CV: Regular rate and rhythm without murmurs, tender chest wall Pulm: Clear to auscultation bilaterally Derm: Violaceous rash over her sternum and below right breast unchanged from yesterday  Assessment/Plan:  Principal Problem:   MRSA bacteremia Active Problems:   Opioid overdose (HCC)   Septic pulmonary embolism, bilateral    Suspected endocarditis   Active intravenous drug use   Pericardial effusion   Rash/skin eruption   Abscess of bursa of left shoulder extending into chest and upper arm    Intravenous drug abuse ([place])    # MRSA bacteremia: Doing well and hemodynamically stable.  Continuing with 6 weeks of IV vancomycin to be finished on 7/31.  Tapering pain regimen as below with plan to switch to buprenorphine. Social work following for placement at the time of discharge.  # OUD: Feeling better with 1.5 mg dilaudid q4. Oxy dc'ed.   Will discuss with patient when she would prefer to transition to suboxone (this week or right before discharge).  Plan will be to stop dilaudid on date of patient choosing.   Eight hrs after her last dilaudid dose, we will give 4 mg suboxone, then reevaluate in a few hours and based on sx may give anotehr 4 mg.   The next day she will start 8 mg BID as a maintenance dose.   Dispo: Pending completion of IV  antibiotics on 7/31.   Al Decant, MD 07/20/2019, 9:24 AM Pager: [phone]

## 2019-07-21 NOTE — Progress Notes (Signed)
Pt requesting to have teaching service call her room ref to increasing her pain medicine or frequency. Q3 hrs due to pain med wearing off before the last hour. Pt stated she "could not talk in front of RN while dr's were in room".  Advised the pt she should really be weaning herself. She continually has pain score of 7/8. No change in vital signs. No obvious sign of pain. Will page teaching service and await orders.  Jerald Kief, RN
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
## 2019-07-21 NOTE — Progress Notes (Signed)
Nutrition Follow-up  DOCUMENTATION CODES:   Not applicable  INTERVENTION:    ContinueEnsure Enlive po TID, each supplement provides 350 kcal and 20 grams of protein  Add Magic cup TID with meals, each supplement provides 290 kcal and 9 grams of protein  ContinueMVI daily  NUTRITION DIAGNOSIS:   Increased nutrient needs related to acute illness as evidenced by estimated needs.  Ongoing  GOAL:   Patient will meet greater than or equal to 90% of their needs  Progressing   MONITOR:   PO intake, Supplement acceptance, Skin, I & O's, Labs, Weight trends  REASON FOR ASSESSMENT:   Consult Assessment of nutrition requirement/status  ASSESSMENT:   Patient with PMH significant for bipolar disorder, thyroid cancer s/p thyroidectomy, and polysubstance abuse. Presents this admission with right PE with concern for developing empyema.   6/24- thoracentesis  Spoke with pt via phone. Reports being nauseous for the last couple of days but feels better this am with medication. Meal completions charted as 25-100% for pt's last eight meals. She reports drinking Ensure Enlive 3-4 times daily. RD offered snacks as pt's weight continues to trend down. Pt refused.   Admission weight: 53.4 kg Current weight: 41.8 kg  Weight continues to trend down. Edema noted to have resolved. Suspect with decreased intake, some could be dry wt loss.   Medications: folic acid, MVI with minerals, abx Labs: CBG 87-124  Diet Order:   Diet Order            Diet regular Room service appropriate? Yes; Fluid consistency: Thin  Diet effective now              EDUCATION NEEDS:   Education needs have been addressed  Skin:  Skin Assessment: Skin Integrity Issues: Skin Integrity Issues:: Other (Comment) Other: blister-mid chest  Last BM:  7/20  Height:   Ht Readings from Last 1 Encounters:  06/14/19 5\' 2"  (1.575 m)    Weight:   Wt Readings from Last 1 Encounters:  07/21/19 41.8 kg     Ideal Body Weight:  50 kg  BMI:  Body mass index is 16.85 kg/m.  Estimated Nutritional Needs:   Kcal:  1600-1800 kcal  Protein:  80-95 grams  Fluid:  >/= 1.6 L/day  Mariana Single RD, LDN Clinical Nutrition Pager # - [phone]

## 2019-07-21 NOTE — Progress Notes (Signed)
Pt heard telling teaching service that she had no way of dialing out to make contact with rehab facilities. Advised pt to dial 0 and give number to operator to call out. Jerald Kief, RN .

## 2019-07-21 NOTE — Progress Notes (Signed)
Pharmacy Antibiotic Note  Monica Stephens is a 38 y.o. female admitted on 06/14/2019 with MRSA bacteremia and endocarditis.  Pharmacy has been consulted for Vanco dosing.  ID: Vanc for MRSA bacteremia and presumed right sided endocarditis.  38 y.o female with h/o IVDU. Noted patient has septic emboli on CT chest and possible pericarditis - TEE negative for endocarditis. MRI of spine w/ septic emboli and paraspinal abscesses. L axillary abscess tracking along pectoral muscle (drained7/7). She has developed several chest and UE abscesses that have been too small for formal I&D.  -WBC wnl, Afeb, LA 1.8>1.1, Scr stable and WNL - Appears will stay here for abx; 6 weeks total (stop date entered 7/31)  6/14 Vanc>> 7/31 (minimum 6 weeks per ID ) 6/14 Cefepime X 1 6/14 Acyclovir X 1  7/4 CTX >> 7/8 d/c'd  6/19 VP/VT 26/13, AUC = 457 on 500mg  q12 >> 1g Q24 for convenience outpt 6/26 VP/VT 27/10, AUC = 458  7/3 VP/VT 31/5: AUC=366.9, 500 mg IV x 1, then increase to 1250mg /24 (not done until 7/6 (next expected AUC 458) 7/10 VP/VT 37/11, AUC = 452 7/17 VP: 33; 7/18 VT: 8, AUC = 486.8 - continue 1250 mg IV q24  6/14 Bcx x 4/4: staph aureus (BCID MRSA) 6/14 COVID: neg  6/14 Ucx: MRSA 6/16 Wound: MRSA 6/17 Bcx: MRSA 6/20 Bcx: NGF 6/24 Pleual fluid: NGF 6/24 Pleural fluid fungus: NGTD 6/24 COVID: neg  6/29: Bcx: NGTD 7/3 Abscess left chest: MRSA 7/7 surgical PCR: MRSA pcr neg; SA neg   Plan: - No changes - Continue Vancomycin 1250 mg IV q 24h through 7/31 - May/may not check one more level prior to d/c   Height: 5\' 2"  (157.5 cm) Weight: 92 lb 2.4 oz (41.8 kg) IBW/kg (Calculated) : 50.1  Temp (24hrs), Avg:98.3 F (36.8 C), Min:98.2 F (36.8 C), Max:98.4 F (36.9 C)  Recent Labs  Lab 07/17/19 0450 07/17/19 1306 07/18/19 1059 07/20/19 1119  WBC 6.3  --   --  5.8  CREATININE 0.56  --   --  0.63  VANCOTROUGH  --   --  8*  --   VANCOPEAK  --  33  --   --     Estimated Creatinine  Clearance: 64.2 mL/min (by C-G formula based on SCr of 0.63 mg/dL).    No Known Allergies   Cortland Crehan S. Alford Highland, PharmD, BCPS Clinical Staff Pharmacist Eilene Ghazi Stillinger 07/21/2019 9:10 AM
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
## 2019-07-21 NOTE — Progress Notes (Signed)
Pt called RN while RN was in room 2. Pt asked if she could have pain med early b/c it was near shift change. RN advised pt pain med is every 4 hrs and pt would receive as soon as rn could get to room. Pt then called desk and advised  rn was not moving quick enough and that she was in severe pain and needed med now. Upon entering room, RN heard pt using foul language about rn and pain med. No obvious sign of pain all vitals stable.  Med giving without incident.  Jerald Kief, RN

## 2019-07-21 NOTE — Progress Notes (Signed)
   Subjective:  No acute events overnight. Patient has some tenderness over chest since getting an ultrasound yesterday. She has no new pain otherwise. She reports the violaceous rashes on her chest have been stable since admission. She plans to call some rehabilitation facilities today. She has no other complaints or concerns.   Objective:  Vital signs in last 24 hours: Vitals:   07/19/19 1234 07/19/19 2032 07/20/19 0447 07/20/19 1900  BP: 90/72 106/73 104/75 98/66  Pulse:  (!) 107 97 96  Resp: 17 (!) 23 14 (!) 21  Temp: 98 F (36.7 C) 98 F (36.7 C) 98.2 F (36.8 C) 98.4 F (36.9 C)  TempSrc: Oral Oral Oral Oral  SpO2:  94% 95% 95%  Weight:   42 kg   Height:       General: Thin female sitting in bed in no acute distress CV: Regular rate and rhythm without murmurs, tender chest wall Pulm: Clear to auscultation bilaterally Derm: Violaceous rash below left breast unchanged from yesterday; erythematous rash with telangiectasias and ruptured bullae over sternum stable from yesteray  Assessment/Plan:  Principal Problem:   MRSA bacteremia Active Problems:   Opioid overdose (HCC)   Septic pulmonary embolism, bilateral    Suspected endocarditis   Active intravenous drug use   Pericardial effusion   Rash/skin eruption   Abscess of bursa of left shoulder extending into chest and upper arm    Intravenous drug abuse ([place])    # MRSA bacteremia: Doing well and hemodynamically stable.  Continuing with 6 weeks of IV vancomycin to be finished on 7/31.   # OUD: 1.5 mg dilaudid q4 prn. Plan to transition to suboxone right before discharge.  # Rash: -originally thought to be shingles in the ED then presumed to be a dermatologic manifestation of MRSA, unchanged since admission -erythematous rash with telangiectasis and ruptured bullae present and stable over sternum -violaceous rash present and stable below left breast -fu on exam  Dispo: Pending completion of IV antibiotics on 7/31.    Al Decant, MD 07/21/2019, 5:37 AM Pager: [phone]

## 2019-07-22 MED ORDER — GABAPENTIN 600 MG PO TABS
300.0000 mg | ORAL_TABLET | Freq: Three times a day (TID) | ORAL | Status: DC
  Administered 2019-07-22 – 2019-07-31 (×28): 300 mg via ORAL
  Filled 2019-07-22 (×28): qty 1

## 2019-07-22 MED ORDER — OXYCODONE HCL 5 MG PO TABS
10.0000 mg | ORAL_TABLET | Freq: Two times a day (BID) | ORAL | Status: AC
  Administered 2019-07-22 (×2): 10 mg via ORAL
  Filled 2019-07-22 (×2): qty 2

## 2019-07-22 NOTE — Progress Notes (Signed)
   Subjective:  No acute events overnight. Patient continues to have some pain over chest which she describes as stabbing and burning. She says the rash under her left breast isn't usually painful although occasionally it is tender and burning as well. She reports normal bowel movements and infrequent dysuria. Pt requested more frequent IV pain medication due to breakthrough pain as doses is wearing off. She has no other complaints or concerns.   Objective:  Vital signs in last 24 hours: Vitals:   07/21/19 1923 07/21/19 1945 07/22/19 0624 07/22/19 0745  BP:  100/66 110/70 97/68  Pulse:  98 (!) 107 (!) 106  Resp: (!) 24 (!) 23 (!) 24 18  Temp:  98.2 F (36.8 C) 98 F (36.7 C) 98.6 F (37 C)  TempSrc:  Oral Oral Oral  SpO2:  98% 96% 95%  Weight:   41.7 kg   Height:       General: Thin female sitting in bed in no acute distress CV: Regular rate and rhythm without murmurs, tender chest wall Pulm: Clear to auscultation bilaterally Derm: Violaceous rash below left breast unchanged from yesterday; erythematous rash with telangiectasias and ruptured bullae over sternum stable from yesteray  Assessment/Plan:  Principal Problem:   MRSA bacteremia Active Problems:   Opioid overdose (HCC)   Septic pulmonary embolism, bilateral    Suspected endocarditis   Active intravenous drug use   Pericardial effusion   Rash/skin eruption   Abscess of bursa of left shoulder extending into chest and upper arm    Intravenous drug abuse ([place])   # MRSA bacteremia: Doing well and hemodynamically stable.  Continuing with 6 weeks of IV vancomycin to be finished on 7/31.   # OUD: 1.5 mg dilaudid q4 prn. Patient reports increased need for pain management between doses with request for q3 dilaudid. We added gabapentin for neuropathic pain from presumed shingles. Will reevaluate pain this afternoon before escalating iv opioid pain medicine. Plan to transition to suboxone right before discharge.  #  Rash/Disseminated Zoster: -originally thought to be shingles in the ED then presumed to be a dermatologic manifestation of MRSA, unchanged since admission -given lack of improvement on antibiotics and description of burning pain, will treat with gabapentin for neuropathic pain from presumed disseminated herpes zoster  Dispo: Pending completion of IV antibiotics on 7/31.   Al Decant, MD 07/22/2019, 11:11 AM Pager: [phone]

## 2019-07-22 NOTE — Progress Notes (Signed)
PT Cancellation Note  Patient Details Name: Monica Stephens MRN: [id number] DOB: Aug 26, 1981   Cancelled Treatment:    Reason Eval/Treat Not Completed: Fatigue/lethargy limiting ability to participate;Patient declined, no reason specified  Pt states she just took a shower and is fatigued and that she "already walked with PT yesterday".  Pt refuses despite encouragement.  Will attempt treatment later as appropriate.  [name] 07/22/2019, 11:42 AM

## 2019-07-23 DIAGNOSIS — Z87311 Personal history of (healed) other pathological fracture: Secondary | ICD-10-CM

## 2019-07-23 DIAGNOSIS — Z86711 Personal history of pulmonary embolism: Secondary | ICD-10-CM

## 2019-07-23 LAB — CBC
HCT: 27.2 % — ABNORMAL LOW (ref 36.0–46.0)
Hemoglobin: 8.4 g/dL — ABNORMAL LOW (ref 12.0–15.0)
MCH: 30.4 pg (ref 26.0–34.0)
MCHC: 30.9 g/dL (ref 30.0–36.0)
MCV: 98.6 fL (ref 80.0–100.0)
Platelets: 397 10*3/uL (ref 150–400)
RBC: 2.76 MIL/uL — ABNORMAL LOW (ref 3.87–5.11)
RDW: 16.3 % — ABNORMAL HIGH (ref 11.5–15.5)
WBC: 6.2 10*3/uL (ref 4.0–10.5)
nRBC: 0 % (ref 0.0–0.2)

## 2019-07-23 LAB — BASIC METABOLIC PANEL
Anion gap: 10 (ref 5–15)
BUN: 30 mg/dL — ABNORMAL HIGH (ref 6–20)
CO2: 26 mmol/L (ref 22–32)
Calcium: 9 mg/dL (ref 8.9–10.3)
Chloride: 101 mmol/L (ref 98–111)
Creatinine, Ser: 0.66 mg/dL (ref 0.44–1.00)
GFR calc Af Amer: >60 mL/min (ref >60)
GFR calc non Af Amer: >60 mL/min (ref >60)
Glucose, Bld: 95 mg/dL (ref 70–99)
Potassium: 4.3 mmol/L (ref 3.5–5.1)
Sodium: 137 mmol/L (ref 135–145)

## 2019-07-23 LAB — FUNGAL ORGANISM REFLEX

## 2019-07-23 LAB — FUNGUS CULTURE WITH STAIN

## 2019-07-23 LAB — FUNGUS CULTURE RESULT

## 2019-07-23 MED ORDER — HYDROMORPHONE HCL 1 MG/ML IJ SOLN
1.2500 mg | INTRAMUSCULAR | Status: DC | PRN
  Administered 2019-07-23 – 2019-07-28 (×43): 1.25 mg via INTRAVENOUS
  Filled 2019-07-23 (×44): qty 2

## 2019-07-23 MED ORDER — DULOXETINE HCL 60 MG PO CPEP
60.0000 mg | ORAL_CAPSULE | Freq: Every day | ORAL | Status: DC
  Administered 2019-07-23 – 2019-07-30 (×8): 60 mg via ORAL
  Filled 2019-07-23 (×8): qty 1

## 2019-07-23 NOTE — Discharge Summary (Signed)
Name: Monica Stephens MRN: [id number] DOB: 12/28/1981 38 y.o. PCP: Patient, No Pcp Per  Date of Admission: 06/14/2019 10:12 AM Date of Discharge:  07/31/2019 Attending Physician: Bartholomew Crews, MD  Discharge Diagnosis:  1. MRSA bacteremia/suspected IE/septic emboli and abscesses  2. OUD/opioid dependence  3. Sternal fracture 4. Depression/Anxiety 5. Rash/skin eruption 6. Anemia   Discharge Medications: Allergies as of 07/31/2019   No Known Allergies     Medication List    STOP taking these medications   acetaminophen 500 MG tablet Commonly known as: TYLENOL   calcium carbonate 500 MG chewable tablet Commonly known as: TUMS - dosed in mg elemental calcium   cephALEXin 500 MG capsule Commonly known as: KEFLEX   FLUoxetine 40 MG capsule Commonly known as: PROZAC   ibuprofen 200 MG tablet Commonly known as: ADVIL   LORazepam 1 MG tablet Commonly known as: ATIVAN   mirtazapine 15 MG disintegrating tablet Commonly known as: REMERON SOL-TAB   ondansetron 4 MG tablet Commonly known as: ZOFRAN     TAKE these medications   buprenorphine-naloxone 8-2 mg Subl SL tablet Commonly known as: SUBOXONE Take one half pill twice a day (every 12 hours)   DULoxetine 60 MG capsule Commonly known as: CYMBALTA Take 1 capsule (60 mg total) by mouth daily.   gabapentin 600 MG tablet Commonly known as: NEURONTIN Take 0.5 tablets (300 mg total) by mouth 3 (three) times daily.   levothyroxine 150 MCG tablet Commonly known as: SYNTHROID Take 1 tablet (150 mcg total) by mouth daily before breakfast. What changed: You were already taking a medication with the same name, and this prescription was added. Make sure you understand how and when to take each.   levothyroxine 75 MCG tablet Commonly known as: SYNTHROID Take 3 tablets (225 mcg total) by mouth every Monday. Start taking on: August 03, 2019 What changed:   medication strength  how much to take  when to take  this  additional instructions   traZODone 100 MG tablet Commonly known as: DESYREL Take 1 tablet (100 mg total) by mouth at bedtime.      Disposition and follow-up:   [name] was discharged from [facility] in Stable condition.  At the hospital follow up visit please address:   1.  Please address her OUD and her pain control with suboxone. Please also address her living situation, ability to keep appointments in the [place] [facility], ability to afford suboxone and counseling follow up.  2.  Labs / imaging needed at time of follow-up: CBC and TSH  3.  Pending labs/ test needing follow-up: none  Follow-up Appointments: [facility] Follow up.   Specialty: Internal Medicine Why: Please follow up in our suboxone clinic on Tuesday, August 4th, at 9:15 AM. Our clinic will call you to schedule a general hospital follow up.  Contact information: [address] [phone] [place] [phone]        Suboxone clinic follow up appointment on August 4th at 9:15 AM. You will be called to schedule a general hospital follow up appointment.   Hospital Course by problem list:  1. MRSA bacteremia/suspected IE/septic emboli and abscesses  -Presented with chest pain and dyspnea in setting of IVDU -Found to have bilateral pulmonary septic emboli and MRSA bacteremia due to suspected IE -course complicated by loculated right pleural effusion s/p thoracentesis, septic abscesses s/p I&D -underwent treatment with 6 weeks minimum  vancomycin with resolution of WBC, fevers and radiologic improvement of known septic emboli  2. Sternal fracture -sternal fracture noted on admission imaging, thought to be secondary to receiving CPR for overdose 3 weeks prior to admission  -treated symptomatically with pain medications with improvement in functional status during admission  3. Rash/skin eruption  -Presented with vesicular skin lesions on her chest initially concerning for shingles then presumed to be secondary to MRSA  -Neuropathic pain associated with these rashes treated with gabapentin -Slight improvement in appearances of rashes near the end of her 6 week course of vancomycin   4. OUD/opioid dependence  -Pt has a hx of IVDU and overdose now complicated by IE with desire to make a change in her life -required 1.25 mg dilaudid q3 hrs up until discharge at which point we transitioned her to suboxone, 4 mg BID -patient has follow up in our [place] clinic with plans to stay in an [place] on discharge  5. Depression/Anxiety -Pt has complex mental health history with diagnoses of bipolar, depression, anxiety, abuse and reports mental illness as a catalyst to the use of drugs -She was started on Cymbalta inpatient for depression and pain control as well as gabapentin for neuropathic pain which patient reports has helped with her anxiety  -in touch with our counselor in the clinic  6. Anemia  -Pt presented with Hgb 11.9 on admission which downtrended slowly -Lowest Hgb of 6.6 on 06/30 without evidence of active bleeding and received a transfusion -Possibly patient had this severe anemia on admission but was hemoconcentrated when she came in -pt sp thyroidectomy for thyroid cancer in 2008 with elevated TSH on admission so anemia possibly secondary to hypothyroidism; treated with synthroid while admitted; no TSH recheck while in the hospital; fu outpatient -patient has maintained a Hgb around 8 while admitted with iron studies suggestive of anemia of chronic disease given elevated ferritin and low TIBC and normal iron and iron saturation    Discharge Vitals:   BP 100/70 (BP Location: Left Arm)   Pulse (!) 151   Temp 98.2 F (36.8 C) (Oral)   Resp 16   Ht 5\' 2"  (1.575 m)   Wt 44.2 kg   LMP 06/13/2018   SpO2 91%   BMI 17.83 kg/m   Pertinent Labs, Studies, and Procedures:    CMP Latest Ref Rng & Units 07/26/2019 07/23/2019 07/20/2019  Glucose 70 - 99 mg/dL 95 95 110(H)  BUN 6 - 20 mg/dL 28(H) 30(H) 28(H)  Creatinine 0.44 - 1.00 mg/dL 0.50 0.66 0.63  Sodium 135 - 145 mmol/L 139 137 135  Potassium 3.5 - 5.1 mmol/L 4.1 4.3 4.0  Chloride 98 - 111 mmol/L 101 101 100  CO2 22 - 32 mmol/L 29 26 26   Calcium 8.9 - 10.3 mg/dL 9.0 9.0 8.6(L)  Total Protein 6.5 - 8.1 g/dL - - -  Total Bilirubin 0.3 - 1.2 mg/dL - - -  Alkaline Phos 38 - 126 U/L - - -  AST 15 - 41 U/L - - -  ALT 0 - 44 U/L - - -   CBC Latest Ref Rng & Units 07/29/2019 07/28/2019 07/26/2019  WBC 4.0 - 10.5 K/uL 5.8 4.8 4.7  Hemoglobin 12.0 - 15.0 g/dL 7.7(L) 7.2(L) 7.4(L)  Hematocrit 36.0 - 46.0 % 24.9(L) 23.3(L) 24.0(L)  Platelets 150 - 400 K/uL 490(H) 425(H) 427(H)    Discharge Instructions: Discharge Instructions    Activity as tolerated - No restrictions   Complete by: As directed  Activity as tolerated - No restrictions   Complete by: As directed    Diet general   Complete by: As directed       Signed: Al Decant, MD 07/31/2019, 1:28 PM   Pager: [phone]

## 2019-07-23 NOTE — Progress Notes (Signed)
   Subjective:  No acute events overnight. Patient was walking up and down the hallway before we entered. Patient endorses severe pain in chest such that she cannot take deep breaths or yawn, particularly when sitting down after walking and with rising from the chair. She has to take short shallow breaths. She denies change in her known chest rashes. She is planning to go to an [facility] after discharge and is excited to be working towards having her children back in her life. Patient was excited to have pictures of her children hung up in her room. She has no other complaints or concerns.   Objective:  Vital signs in last 24 hours: Vitals:   07/22/19 1815 07/22/19 2036 07/23/19 0414 07/23/19 0719  BP: 113/76 106/74    Pulse: (!) 108 60    Resp:  (!) 23  17  Temp: 98.2 F (36.8 C) 98.4 F (36.9 C) 98.7 F (37.1 C)   TempSrc: Oral Oral Oral   SpO2: 95% 97%    Weight:   41.8 kg   Height:       General: Thin female sitting up in chair in no acute distress CV: Regular rate and rhythm without murmurs, tender chest wall Pulm: Clear to auscultation bilaterally Derm: Violaceous rash below left breast unchanged; erythematous rash with telangiectasias and ruptured bullae over sternum stable  Labs: CBC Latest Ref Rng & Units 07/23/2019 07/20/2019 07/17/2019  WBC 4.0 - 10.5 K/uL 6.2 5.8 6.3  Hemoglobin 12.0 - 15.0 g/dL 8.4(L) 8.2(L) 8.3(L)  Hematocrit 36.0 - 46.0 % 27.2(L) 26.7(L) 27.9(L)  Platelets 150 - 400 K/uL 397 303 292   BMP Latest Ref Rng & Units 07/23/2019 07/20/2019 07/17/2019  Glucose 70 - 99 mg/dL 95 110(H) 87  BUN 6 - 20 mg/dL 30(H) 28(H) 25(H)  Creatinine 0.44 - 1.00 mg/dL 0.66 0.63 0.56  Sodium 135 - 145 mmol/L 137 135 136  Potassium 3.5 - 5.1 mmol/L 4.3 4.0 4.0  Chloride 98 - 111 mmol/L 101 100 102  CO2 22 - 32 mmol/L 26 26 27   Calcium 8.9 - 10.3 mg/dL 9.0 8.6(L) 8.8(L)   Assessment/Plan:  Principal Problem:   MRSA bacteremia Active Problems:   Opioid overdose (HCC)   Septic pulmonary embolism, bilateral    Suspected endocarditis   Active intravenous drug use   Pericardial effusion   Rash/skin eruption   Abscess of bursa of left shoulder extending into chest and upper arm    Intravenous drug abuse ([place])  # MRSA bacteremia: Doing well and hemodynamically stable.  Continuing with 6 weeks of IV vancomycin to be finished on 7/31.   # OUD: altered pain regimen to 1.25 mg dilaudid q3 prn. Gabapentin added yesterday has not helped pain but has improved her mood. Will reevaluate pain tomorrow. Plan to transition to suboxone right before discharge.  # Rash/Presumed Post Herpetic Neuralgia: -originally thought to be shingles in the ED then presumed to be a dermatologic manifestation of MRSA, unchanged since admission -given lack of improvement on antibiotics and description of burning pain, will treat with gabapentin for neuropathic pain   Dispo: Pending completion of IV antibiotics on 7/31.   Al Decant, MD 07/23/2019, 11:17 AM Pager: [phone]
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
## 2019-07-24 LAB — VANCOMYCIN, TROUGH: Vancomycin Tr: 5 ug/mL — ABNORMAL LOW (ref 15–20)

## 2019-07-24 NOTE — Progress Notes (Addendum)
   Subjective:  No acute events overnight. Patient reports continued sharp stabbing pain of her chest. She says that having dilaudid every 3 hours is better than every 4. She requested to have the 1.5 dilaudid every 3 hours but we told patient we would prefer to have her on the lower dose as we get ready to transition to her to oral medication before her discharge. Patient was ok with this plan. Patient reports mild nausea but no vomiting.   Objective:  Vital signs in last 24 hours: Vitals:   07/23/19 1157 07/23/19 2021 07/24/19 0309 07/24/19 0312  BP: 103/67 98/63 99/62  99/62  Pulse: (!) 107 (!) 105 97 97  Resp:  (!) 26 14 14   Temp: 98.1 F (36.7 C) 98.3 F (36.8 C) 98.1 F (36.7 C)   TempSrc: Oral Oral Oral   SpO2: 97% 98% 94% 91%  Weight:    42.8 kg  Height:       General: Thin female sitting up in chair in no acute distress CV: Regular rate and rhythm without murmurs, tender chest wall Pulm: Clear to auscultation bilaterally GI: Soft, NT, ND, bowel sounds present  Labs: CBC Latest Ref Rng & Units 07/23/2019 07/20/2019 07/17/2019  WBC 4.0 - 10.5 K/uL 6.2 5.8 6.3  Hemoglobin 12.0 - 15.0 g/dL 8.4(L) 8.2(L) 8.3(L)  Hematocrit 36.0 - 46.0 % 27.2(L) 26.7(L) 27.9(L)  Platelets 150 - 400 K/uL 397 303 292   BMP Latest Ref Rng & Units 07/23/2019 07/20/2019 07/17/2019  Glucose 70 - 99 mg/dL 95 110(H) 87  BUN 6 - 20 mg/dL 30(H) 28(H) 25(H)  Creatinine 0.44 - 1.00 mg/dL 0.66 0.63 0.56  Sodium 135 - 145 mmol/L 137 135 136  Potassium 3.5 - 5.1 mmol/L 4.3 4.0 4.0  Chloride 98 - 111 mmol/L 101 100 102  CO2 22 - 32 mmol/L 26 26 27   Calcium 8.9 - 10.3 mg/dL 9.0 8.6(L) 8.8(L)   Assessment/Plan:  Principal Problem:   MRSA bacteremia Active Problems:   Opioid overdose (HCC)   Septic pulmonary embolism, bilateral    Suspected endocarditis   Active intravenous drug use   Pericardial effusion   Rash/skin eruption   Abscess of bursa of left shoulder extending into chest and upper arm   Intravenous drug abuse ([place])  # MRSA bacteremia: Doing well and hemodynamically stable.  Continuing with 6 weeks of IV vancomycin to be finished on 7/31.   # OUD: altered pain regimen to 1.25 mg dilaudid q3 prn yesterday from 1.50 mg dilaudid q4 prn. Gabapentin added on 7/22. Will continue to evaluate pain. Plan to transition to suboxone right before discharge.  # Rash/Presumed Post Herpetic Neuralgia: -originally thought to be shingles in the ED then presumed to be a dermatologic manifestation of MRSA, unchanged since admission -given lack of improvement on antibiotics and description of burning pain, patient being treated with gabapentin  Dispo: Pending completion of IV antibiotics on 7/31.   Jeanmarie Hubert, MD 07/24/2019, 11:09 AM Pager: [phone]

## 2019-07-24 NOTE — Progress Notes (Signed)
  Date: 07/24/2019  Patient name: Monica Stephens  Medical record number: [id number]  Date of birth: Feb 04, 1981   I have seen and evaluated this patient and I have discussed the plan of care with the house staff. Please see their note for complete details. I concur with their findings with the following additions/corrections: Ms. Dunckel was seen this morning on team rounds.  She states her sternal fracture was a result of CPR.  Literature review indicates this can have long-term disability and pain.  Agree with keeping opioid regimen as is, 1.25 mg every 3 hours.  Plan is to transition to Suboxone before her discharge on July 31.  Bartholomew Crews, MD 07/24/2019, 1:00 PM

## 2019-07-24 NOTE — Progress Notes (Signed)
Pharmacy Antibiotic Note  Monica Stephens is a 38 y.o. female admitted on 06/14/2019 with MRSA bacteremia and endocarditis.  Pharmacy has been consulted for Vanco dosing.  ID: Vanc for MRSA bacteremia and presumed right sided endocarditis.  38 y.o female with h/o IVDU. Noted patient has septic emboli on CT chest and possible pericarditis - TEE negative for endocarditis. MRI of spine w/ septic emboli and paraspinal abscesses. L axillary abscess tracking along pectoral muscle (drained7/7). She has developed several chest and UE abscesses that have been too small for formal I&D.   -WBC wnl, Afeb, LA 1.8>1.1, Scr stable and WNL - Appears will stay here for abx; 6 weeks total (stop date entered 7/31)  6/14 Vanc>> 7/31 (minimum 6 weeks per ID ) 6/14 Cefepime X 1 6/14 Acyclovir X 1  7/4 CTX >> 7/8 d/c'd  6/19 VP/VT 26/13, AUC = 457 on 500mg  q12 >> 1g Q24 for convenience outpt 6/26 VP/VT 27/10, AUC = 458  7/3 VP/VT 31/5: AUC=366.9, 500 mg IV x 1, then increase to 1250mg /24 (not done until 7/6 (next expected AUC 458) 7/10 VP/VT 37/11, AUC = 452 7/17 VP: 33; 7/18 VT: 8, AUC = 486.8 - continue 1250 mg IV q24 7/24 Vanc trough 5 - drawn 1 hour late, level very close to previous levels. Will not change dose at this time.   6/14 Bcx x 4/4: staph aureus (BCID MRSA) 6/14 COVID: neg  6/14 Ucx: MRSA 6/16 Wound: MRSA 6/17 Bcx: MRSA 6/20 Bcx: NGF 6/24 Pleual fluid: NGF 6/24 Pleural fluid fungus: NGTD 6/24 COVID: neg  6/29: Bcx: NGTD 7/3 Abscess left chest: MRSA 7/7 surgical PCR: MRSA pcr neg; SA neg   Plan: - No changes - Continue Vancomycin 1250 mg IV q 24h through 7/31 - Do not plan on rechecking level prior to stopping   Height: 5\' 2"  (157.5 cm) Weight: 94 lb 6.4 oz (42.8 kg) IBW/kg (Calculated) : 50.1  Temp (24hrs), Avg:98.2 F (36.8 C), Min:98.1 F (36.7 C), Max:98.3 F (36.8 C)  Recent Labs  Lab 07/18/19 1059 07/20/19 1119 07/23/19 0430 07/24/19 1000  WBC  --  5.8 6.2  --    CREATININE  --  0.63 0.66  --   VANCOTROUGH 8*  --   --  5*    Estimated Creatinine Clearance: 65.7 mL/min (by C-G formula based on SCr of 0.66 mg/dL).    No Known Allergies  Erin Hearing PharmD., BCPS Clinical Pharmacist 07/24/2019 2:01 PM
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
## 2019-07-25 NOTE — Progress Notes (Signed)
   Subjective:  No acute events overnight.  Patient reports continued pain of her chest similar in quality and amount to before.  She states that she is trying to arrange for her placement in an [facility].  She is inquiring with the facilities to see if it is okay for her to be on Suboxone.  Patient reports that the PRN Dilaudid helps with the pain but has had difficulty in acquiring it at 3-hour intervals. Spoke with RN, and the high frequency of the 3 hour interval is difficult to accommodate.   Objective:  Vital signs in last 24 hours: Vitals:   07/25/19 0023 07/25/19 0025 07/25/19 0613 07/25/19 0845  BP:  (!) 98/57 95/78 97/64   Pulse:    (!) 104  Resp:  (!) 22 (!) 21 19  Temp: 98.3 F (36.8 C)  98.5 F (36.9 C) 98.1 F (36.7 C)  TempSrc:   Oral Oral  SpO2:  94% 97% 97%  Weight:   42.5 kg   Height:       General: Thin female sitting up in chair in no acute distress CV: Regular rate and rhythm without murmurs, tender chest wall Pulm: Clear to auscultation bilaterally GI: Soft, NT, ND  Labs: CBC Latest Ref Rng & Units 07/23/2019 07/20/2019 07/17/2019  WBC 4.0 - 10.5 K/uL 6.2 5.8 6.3  Hemoglobin 12.0 - 15.0 g/dL 8.4(L) 8.2(L) 8.3(L)  Hematocrit 36.0 - 46.0 % 27.2(L) 26.7(L) 27.9(L)  Platelets 150 - 400 K/uL 397 303 292   BMP Latest Ref Rng & Units 07/23/2019 07/20/2019 07/17/2019  Glucose 70 - 99 mg/dL 95 110(H) 87  BUN 6 - 20 mg/dL 30(H) 28(H) 25(H)  Creatinine 0.44 - 1.00 mg/dL 0.66 0.63 0.56  Sodium 135 - 145 mmol/L 137 135 136  Potassium 3.5 - 5.1 mmol/L 4.3 4.0 4.0  Chloride 98 - 111 mmol/L 101 100 102  CO2 22 - 32 mmol/L 26 26 27   Calcium 8.9 - 10.3 mg/dL 9.0 8.6(L) 8.8(L)   Assessment/Plan:  Principal Problem:   MRSA bacteremia Active Problems:   Opioid overdose (HCC)   Septic pulmonary embolism, bilateral    Suspected endocarditis   Active intravenous drug use   Pericardial effusion   Rash/skin eruption   Abscess of bursa of left shoulder extending into  chest and upper arm    Intravenous drug abuse ([place])  Ms. Coss is a 38 year old female with opiate use disorder who presented with chest pain and dyspnea and was found to have MRSA bacteremia.  Patient is stable and continuing treatment with IV antibiotics.  # MRSA bacteremia: Doing well and hemodynamically stable.  Continuing with 6 weeks of IV vancomycin to be finished on 7/31.   # OUD: altered pain regimen to 1.25 mg dilaudid q3 prn on 7/23 from 1.50 mg dilaudid q4 prn. Gabapentin added on 7/22. Will continue to evaluate pain.  The plan to transition to suboxone before discharge.  # Rash/Presumed Post Herpetic Neuralgia: -originally thought to be shingles in the ED then presumed to be a dermatologic manifestation of MRSA, unchanged since admission -given lack of improvement on antibiotics and description of burning pain, patient being treated with gabapentin  Dispo: Pending completion of IV antibiotics on 7/31.   Jeanmarie Hubert, MD 07/25/2019, 12:38 PM Pager: [phone]

## 2019-07-26 ENCOUNTER — Inpatient Hospital Stay (HOSPITAL_COMMUNITY): Payer: Self-pay

## 2019-07-26 LAB — BASIC METABOLIC PANEL
Anion gap: 9 (ref 5–15)
BUN: 28 mg/dL — ABNORMAL HIGH (ref 6–20)
CO2: 29 mmol/L (ref 22–32)
Calcium: 9 mg/dL (ref 8.9–10.3)
Chloride: 101 mmol/L (ref 98–111)
Creatinine, Ser: 0.5 mg/dL (ref 0.44–1.00)
GFR calc Af Amer: >60 mL/min (ref >60)
GFR calc non Af Amer: >60 mL/min (ref >60)
Glucose, Bld: 95 mg/dL (ref 70–99)
Potassium: 4.1 mmol/L (ref 3.5–5.1)
Sodium: 139 mmol/L (ref 135–145)

## 2019-07-26 LAB — CBC
HCT: 24 % — ABNORMAL LOW (ref 36.0–46.0)
Hemoglobin: 7.4 g/dL — ABNORMAL LOW (ref 12.0–15.0)
MCH: 30.5 pg (ref 26.0–34.0)
MCHC: 30.8 g/dL (ref 30.0–36.0)
MCV: 98.8 fL (ref 80.0–100.0)
Platelets: 427 10*3/uL — ABNORMAL HIGH (ref 150–400)
RBC: 2.43 MIL/uL — ABNORMAL LOW (ref 3.87–5.11)
RDW: 15.6 % — ABNORMAL HIGH (ref 11.5–15.5)
WBC: 4.7 10*3/uL (ref 4.0–10.5)
nRBC: 0 % (ref 0.0–0.2)

## 2019-07-26 LAB — FERRITIN: Ferritin: 167 ng/mL (ref 11–307)

## 2019-07-26 IMAGING — DX CHEST  1 VIEW
1 series · 1 of 1 positions shown · non-contrast
Comparison: Portable exam [4K] hours compared to [DATE]

CLINICAL DATA: Occluded PICC line

EXAM:
CHEST  1 VIEW

[chest ap]
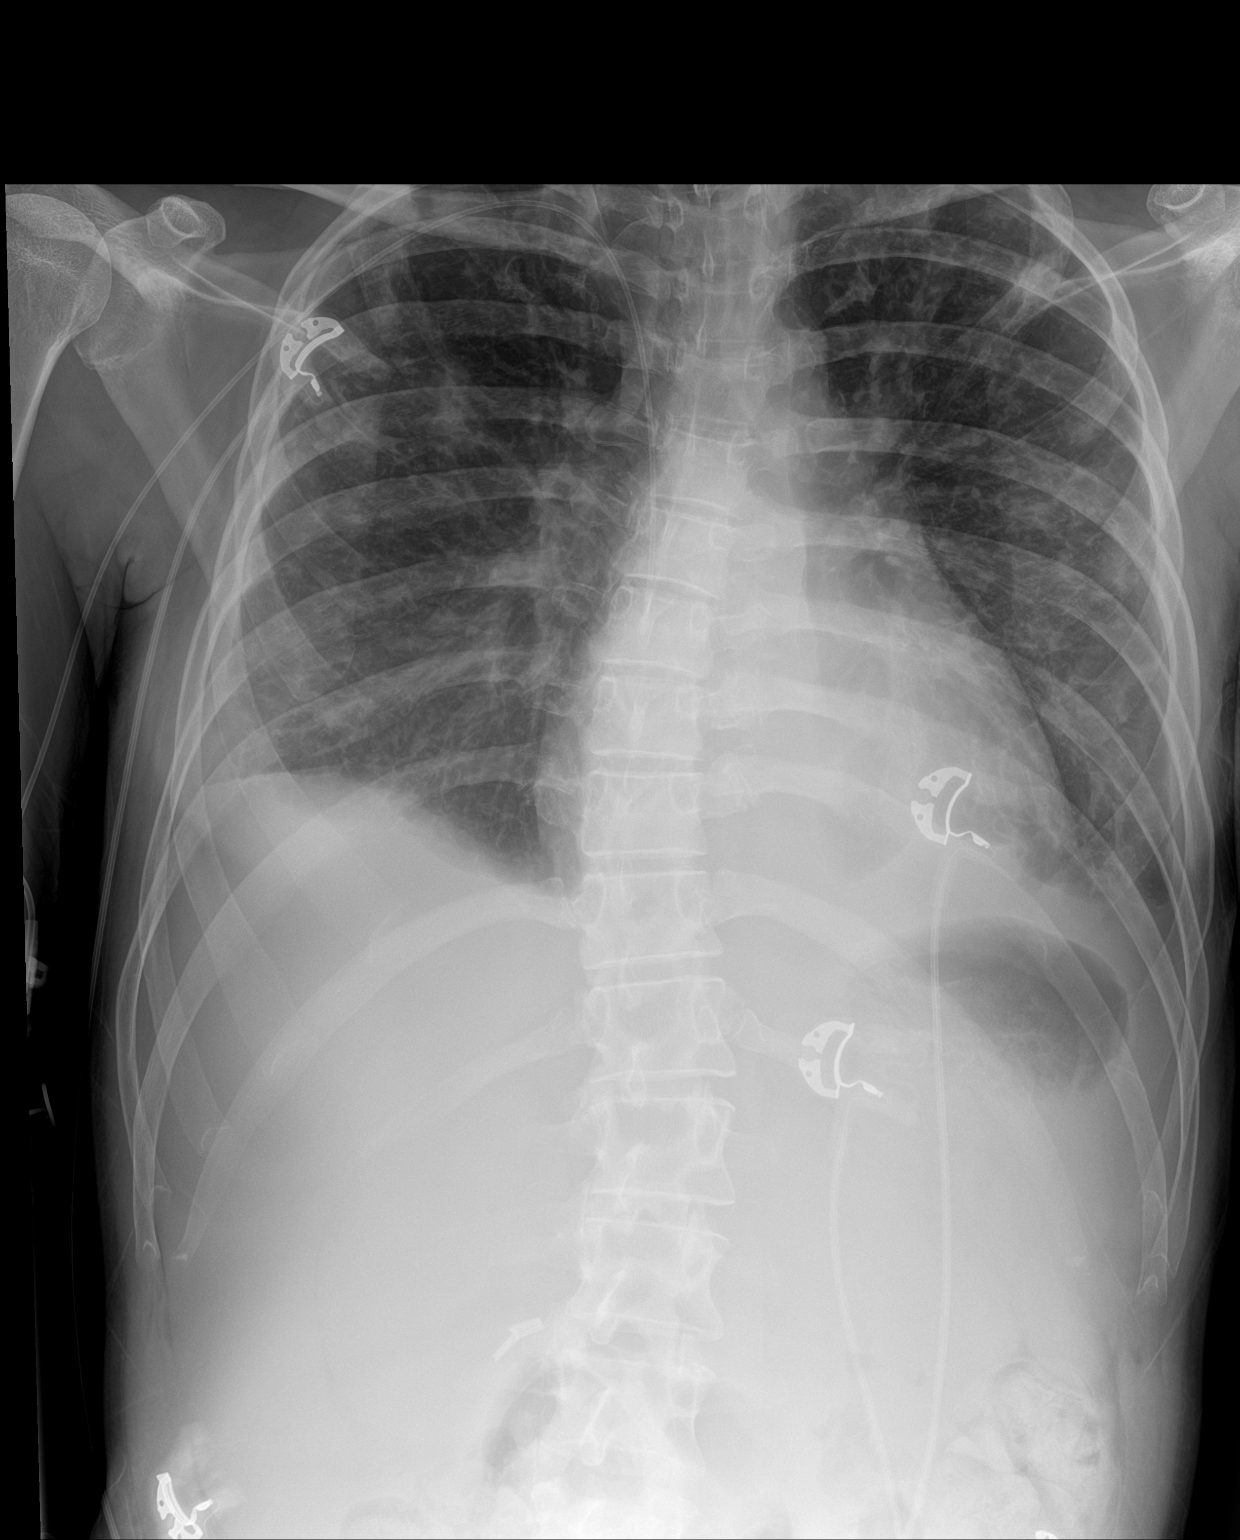

[1 of 1 positions shown; findings below may reference images not displayed]

FINDINGS: RIGHT arm PICC line tip projects over SVC.

Normal heart size and mediastinal contours.

Improved BILATERAL pulmonary infiltrates.

Bibasilar effusions and mild RIGHT basilar atelectasis.

No pneumothorax or acute osseous findings.
IMPRESSION: Improved pulmonary infiltrates.

Small bibasilar pleural effusions and atelectasis greater on RIGHT.

## 2019-07-26 MED ORDER — ALTEPLASE 2 MG IJ SOLR
2.0000 mg | Freq: Once | INTRAMUSCULAR | Status: AC
  Administered 2019-07-26: 2 mg

## 2019-07-26 NOTE — Progress Notes (Signed)
PICC line with no blood return x 2. Unable to collect AM labs. Will contact lab and have someone come to draw. Will continue to monitor.

## 2019-07-26 NOTE — Progress Notes (Signed)
   Subjective:  No acute events overnight.  Patient doing very well this morning reports improvement in pain.  Tolerating PO intake.  Last bowel movement was yesterday.  Objective:  Vital signs in last 24 hours: Vitals:   07/25/19 0613 07/25/19 0845 07/25/19 1950 07/26/19 0439  BP: 95/78 97/64 118/75 113/73  Pulse:  (!) 104 (!) 106 98  Resp: (!) 21 19 15 12   Temp: 98.5 F (36.9 C) 98.1 F (36.7 C) 98.7 F (37.1 C) 98.2 F (36.8 C)  TempSrc: Oral Oral Oral Oral  SpO2: 97% 97% 97%   Weight: 42.5 kg   42.3 kg  Height:       General: Chronically ill-appearing female, thin, no acute distress CV: Regular rate and rhythm without murmurs Pulm: Clear to auscultation bilaterally Ext: Warm and well-perfused without edema  Assessment/Plan:  Principal Problem:   MRSA bacteremia Active Problems:   Opioid overdose (HCC)   Septic pulmonary embolism, bilateral    Suspected endocarditis   Active intravenous drug use   Pericardial effusion   Rash/skin eruption   Abscess of bursa of left shoulder extending into chest and upper arm    Intravenous drug abuse (HCC)   # MRSA bacteremia with suspicion for IE: No changes in management at this time. Continue IV vancomycin until 7/31.   # OUD: We will continue IV Dilaudid 1.25 mg q3h PRN for pain with plan to transition to Suboxone closer to discharge.   # Normocytic anemia: Hgb 7.4 this AM with increase platelets and RDW. Will check ferritin.   Dispo: Pending completion of antibiotics on 7/31.  Welford Roche, MD 07/26/2019, 6:23 AM Pager: [phone]

## 2019-07-27 MED ORDER — PHENYLEPHRINE HCL-NACL 10-0.9 MG/250ML-% IV SOLN
INTRAVENOUS | Status: AC
  Filled 2019-07-27: qty 250

## 2019-07-27 NOTE — Progress Notes (Signed)
Pharmacy Antibiotic Note  KATHELYN GOMBOS is a 38 y.o. female admitted on 06/14/2019 with MRSA bacteremia and endocarditis.  Pharmacy has been consulted for vancomycin dosing.  Renal function is stable and previous vancomycin levels were within desired range.  Afebrile, WBC WNL.  Plan: Continue vanc 1250mg  IV Q24H through 07/31/19 Monitor renal fxn, clinical progress No plan to repeat vanc AUC given therapy will end this week, unless renal fxn worsens   Height: 5\' 2"  (157.5 cm) Weight: 93 lb 4.8 oz (42.3 kg) IBW/kg (Calculated) : 50.1  Temp (24hrs), Avg:98.3 F (36.8 C), Min:98.3 F (36.8 C), Max:98.3 F (36.8 C)  Recent Labs  Lab 07/20/19 1119 07/23/19 0430 07/24/19 1000 07/26/19 0833  WBC 5.8 6.2  --  4.7  CREATININE 0.63 0.66  --  0.50  VANCOTROUGH  --   --  5*  --     Estimated Creatinine Clearance: 64.9 mL/min (by C-G formula based on SCr of 0.5 mg/dL).    No Known Allergies   6/14 Vanc>> 7/31 (minimum 6 weeks per ID ) 6/14 Cefepime X 1 6/14 Acyclovir X 1  7/4 CTX >> 7/8 d/c'd  6/19 VP/VT 26/13, AUC = 457 on 500mg  q12 >> 1g q24 for convenience outpt 6/26 VP/VT 27/10, AUC = 458  7/3 VP/VT 31/5, AUC = 367 >> 500mg  IV x1, then incr to 1250mg /24 7/10 VP/VT 37/11, AUC = 452 - no change 7/17 VP/VT 33/8, AUC = 486.8 - no change 7/24 VT 5 (drawn slightly late ~1h) - no change  6/14 Bcx x 4/4: staph aureus (BCID MRSA) 6/14 COVID: neg  6/14 Ucx: MRSA 6/16 Wound: MRSA 6/17 Bcx: MRSA 6/20 Bcx: NGF 6/24 Pleual fluid: NGF 6/24 Pleural fluid fungus: NGTD 6/24 COVID: neg  6/29: Bcx: NGTD 7/3 Abscess left chest: MRSA 7/7 surgical PCR: MRSA pcr neg; SA neg  Kaige Whistler D. Mina Marble, PharmD, BCPS, [place] 07/27/2019, 9:41 AM

## 2019-07-27 NOTE — Progress Notes (Signed)
Patient ambulating in hallway independently. Veldon Wager, Bettina Gavia RN

## 2019-07-27 NOTE — Progress Notes (Signed)
Subjective:  No acute events overnight. Patient reports continued chest pain. Mild relief provided by dilaudid. She understands reasoning of keeping dosage on dilaudid low so we can transition to suboxone. Patient desires to make transition as close to discharge as possible. Patient has spoken with several [facility] and has identified one that will allow her to be on suboxone. She is hopeful that her parents will help pay her deposit as she does not have any money. She is happy about [facility] location as there are a good number of jobs in the area that she could apply for. 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 She has another call scheduled with the [facility] today. Patient states that she is determined to stay clean from drugs and do what it takes to stay clean.  Objective:  Vital signs in last 24 hours: Vitals:   07/26/19 2007 07/27/19 0433 07/27/19 1006 07/27/19 1233  BP: 104/71  95/67 103/67  Pulse: (!) 108   89  Resp: 20  (!) 21 19  Temp: 98.3 F (36.8 C)   98.3 F (36.8 C)  TempSrc: Oral   Oral  SpO2: 99%   100%  Weight:  42.3 kg    Height:       General: Thin female sitting up in chair in no acute distress CV: Regular rate and rhythm without murmurs, tender chest wall Pulm: Clear to auscultation bilaterally GI: Soft, NT, ND  Labs: CBC Latest Ref Rng & Units 07/26/2019 07/23/2019 07/20/2019  WBC 4.0 - 10.5 K/uL 4.7 6.2 5.8  Hemoglobin 12.0 - 15.0 g/dL 7.4(L) 8.4(L) 8.2(L)  Hematocrit 36.0 - 46.0 % 24.0(L) 27.2(L) 26.7(L)  Platelets 150 - 400 K/uL 427(H) 397 303   BMP Latest Ref Rng & Units 07/26/2019 07/23/2019 07/20/2019  Glucose 70 - 99 mg/dL 95 95 110(H)  BUN 6 - 20 mg/dL 28(H) 30(H) 28(H)  Creatinine 0.44 - 1.00 mg/dL 0.50 0.66 0.63  Sodium 135 - 145 mmol/L 139 137 135  Potassium 3.5 - 5.1 mmol/L 4.1 4.3 4.0  Chloride 98 - 111 mmol/L 101 101 100  CO2 22 - 32 mmol/L 29 26  26   Calcium 8.9 - 10.3 mg/dL 9.0 9.0 8.6(L)   Assessment/Plan:  Principal Problem:   MRSA bacteremia Active Problems:   Opioid overdose (HCC)   Septic pulmonary embolism, bilateral    Suspected endocarditis   Active intravenous drug use   Pericardial effusion   Rash/skin eruption   Abscess of bursa of left shoulder extending into chest and upper arm    Intravenous drug abuse ([place])  Ms. Paquette is a 38 year old female with opiate use disorder who presented with chest pain and dyspnea and was found to have MRSA bacteremia.  Patient is stable and continuing treatment with IV antibiotics.  # MRSA bacteremia: Doing well and hemodynamically stable. Continuing with 6 weeks of IV vancomycin to be finished on 7/31.   # OUD: altered pain regimen to 1.25 mg dilaudid q3 prn on 7/23 from 1.50 mg dilaudid q4 prn. Gabapentin added on 7/22. Will continue to evaluate pain. The plan to transition to suboxone before discharge. Will continue discussing best time for transition.  # Rash/Presumed Post Herpetic Neuralgia: - originally thought to be shingles in the ED then presumed to be a dermatologic manifestation of MRSA, unchanged since admission - given lack of improvement on antibiotics and  description of burning pain, patient being treated with gabapentin  Dispo: Pending completion of IV antibiotics on 7/31.   Jeanmarie Hubert, MD 07/27/2019, 1:18 PM Pager: [phone]

## 2019-07-27 NOTE — Progress Notes (Signed)
PT Cancellation Note  Patient Details Name: TANARA TURVEY MRN: [id number] DOB: 04-15-1981   Cancelled Treatment:    Reason Eval/Treat Not Completed: Pain limiting ability to participate. Pt politely declining due to pain. Reports her current pain meds are not controlling her pain. Awaiting change to pain regimen.    Lorriane Shire 07/27/2019, 12:35 PM  Lorrin Goodell, PT  Office # [phone] Pager [phone]

## 2019-07-27 NOTE — Progress Notes (Addendum)
Patient states pain medication as written is not helping to relieve pain. Patient states she is still in pain MD on call paged to make aware. Md stated that they would address on rounds with patient. Aunika Kirsten, Bettina Gavia RN

## 2019-07-28 ENCOUNTER — Telehealth: Payer: Self-pay | Admitting: *Deleted

## 2019-07-28 LAB — CBC
HCT: 23.3 % — ABNORMAL LOW (ref 36.0–46.0)
Hemoglobin: 7.2 g/dL — ABNORMAL LOW (ref 12.0–15.0)
MCH: 30.4 pg (ref 26.0–34.0)
MCHC: 30.9 g/dL (ref 30.0–36.0)
MCV: 98.3 fL (ref 80.0–100.0)
Platelets: 425 10*3/uL — ABNORMAL HIGH (ref 150–400)
RBC: 2.37 MIL/uL — ABNORMAL LOW (ref 3.87–5.11)
RDW: 15.3 % (ref 11.5–15.5)
WBC: 4.8 10*3/uL (ref 4.0–10.5)
nRBC: 0 % (ref 0.0–0.2)

## 2019-07-28 MED ORDER — ZOLPIDEM TARTRATE 5 MG PO TABS
5.0000 mg | ORAL_TABLET | Freq: Every day | ORAL | Status: AC
  Administered 2019-07-28: 5 mg via ORAL
  Filled 2019-07-28: qty 1

## 2019-07-28 MED ORDER — RIVAROXABAN 10 MG PO TABS
10.0000 mg | ORAL_TABLET | Freq: Every day | ORAL | Status: DC
  Administered 2019-07-29 – 2019-07-30 (×3): 10 mg via ORAL
  Filled 2019-07-28 (×4): qty 1

## 2019-07-28 MED ORDER — RIVAROXABAN 10 MG PO TABS: 10.0000 mg | ORAL_TABLET | Freq: Every day | ORAL | Status: DC

## 2019-07-28 MED ORDER — ZOLPIDEM TARTRATE 5 MG PO TABS: 5.0000 mg | ORAL_TABLET | Freq: Every day | ORAL | Status: DC

## 2019-07-28 NOTE — Progress Notes (Signed)
PT Cancellation Note  Patient Details Name: Monica Stephens MRN: [id number] DOB: 1981/06/06   Cancelled Treatment:    Reason Eval/Treat Not Completed: Patient declined, no reason specified- before I even introduced myself patient states "I don't think I can walk today. I didn't sleep at all last night." Will re-attempt later if time allows.   Lamyah Creed 07/28/2019, 1:24 PM

## 2019-07-28 NOTE — Telephone Encounter (Signed)
-----   Message from Bartholomew Crews, MD sent at 07/23/2019 11:48 AM EDT ----- Would you please schedule her an appointment for the first Tuesday in August as a new patient in the [facility]?  I believe that would be August 4. Thanks

## 2019-07-28 NOTE — Progress Notes (Signed)
Student Pharmacist rounding with the IMTS-B1 service was consulted on the optimization of medications. The patient is a 38 year old female on day 48 of hospital admission for treatment of MRSA bacteremia. She is experiencing new-onset insomnia to which the medicine team sought to adjust her medications to provide relief. She was previously managed with ramelteon 8mg  daily at bedtime and trazodone 100mg  daily at bedtime.  I have reviewed the patient's profile and her creatinine clearance is listed as 66 mL/min. After discussion with the medical team, zolpidem (Ambien) 5mg  once at bedtime was started for this evening. Additionally, a recommendation to transition from subcutaneous heparin 5000 units every 8 hours to rivaroxaban 10mg  once daily was made. She had received injections at 2341 and 0535 overnight, which could impact her quality of sleep. Transitioning to oral therapy for DVT prophylaxis will remove the need to awake the patient for injections. Continue to monitor for improvement in sleep, medication tolerance, and symptoms of bleeding.  Youlanda Roys, 4th year Stage manager

## 2019-07-28 NOTE — Progress Notes (Signed)
Nutrition Follow-up  DOCUMENTATION CODES:   Not applicable  INTERVENTION:   D/C Magic Cup   [employer] poTID, each supplement provides 350 kcal and 20 grams of protein  ContinueMVI daily  NUTRITION DIAGNOSIS:   Increased nutrient needs related to acute illness as evidenced by estimated needs.  Ongoing   GOAL:   Patient will meet greater than or equal to 90% of their needs  Not meeting  MONITOR:   PO intake, Supplement acceptance, Skin, I & O's, Labs, Weight trends  REASON FOR ASSESSMENT:   Consult Assessment of nutrition requirement/status  ASSESSMENT:   Patient with PMH significant for bipolar disorder, thyroid cancer s/p thyroidectomy, and polysubstance abuse. Presents this admission with right PE with concern for developing empyema.   6/24- thoracentesis  RD working remotely.  Spoke with pt via phone. States appetite is great this week. Meal completions charted as 10-90% for pt's last eight meals. Likes Ensure but states the unit has run out of them. Will discuss with unit. She does not like [employer]- will d/c. Plan for d/c Friday. Reiterated the importance of continued protein intake to maintain lean muscle mass.   Admission weight: 53.4 kg Current weight: 43kg  Medications: folic acid, MVI with minerals, senokot Labs: reviewed   Diet Order:   Diet Order            Diet regular Room service appropriate? Yes; Fluid consistency: Thin  Diet effective now              EDUCATION NEEDS:   Education needs have been addressed  Skin:  Skin Assessment: Skin Integrity Issues: Skin Integrity Issues:: Other (Comment) Other: blister-mid chest  Last BM:  7/27  Height:   Ht Readings from Last 1 Encounters:  06/14/19 5\' 2"  (1.575 m)    Weight:   Wt Readings from Last 1 Encounters:  07/28/19 43 kg    Ideal Body Weight:  50 kg  BMI:  Body mass index is 17.34 kg/m.  Estimated Nutritional Needs:   Kcal:  1600-1800  kcal  Protein:  80-95 grams  Fluid:  >/= 1.6 L/day   Mariana Single RD, LDN Clinical Nutrition Pager # - [phone]

## 2019-07-28 NOTE — Progress Notes (Signed)
Patient ambulating in hallway independently. Darcey Demma, Bettina Gavia RN

## 2019-07-28 NOTE — Progress Notes (Signed)
   Subjective:  No acute events overnight. Patient reports no changes in pain. Patient is apprehensive of going into withdrawal and not having adequate pain control. Patient understands need to transition to suboxone and is on board with plan. We offered to give her Lorrin Mais tonight to help with sleep and she was in favor of this plan.  Objective:  Vital signs in last 24 hours: Vitals:   07/27/19 1758 07/27/19 2019 07/28/19 0509 07/28/19 0831  BP: 104/72 101/68 102/70 97/62  Pulse:  (!) 102 (!) 102   Resp: 19 14 11  (!) 26  Temp: 98.5 F (36.9 C) 98.1 F (36.7 C) 98.1 F (36.7 C)   TempSrc: Oral Oral Oral   SpO2: 95% 97% 96% 96%  Weight:   43 kg   Height:       General: Thin female sitting up in chair in no acute distress CV: Regular rate and rhythm without murmurs, tender chest wall Pulm: Clear to auscultation bilaterally GI: Soft, NT, ND  Labs: CBC Latest Ref Rng & Units 07/28/2019 07/26/2019 07/23/2019  WBC 4.0 - 10.5 K/uL 4.8 4.7 6.2  Hemoglobin 12.0 - 15.0 g/dL 7.2(L) 7.4(L) 8.4(L)  Hematocrit 36.0 - 46.0 % 23.3(L) 24.0(L) 27.2(L)  Platelets 150 - 400 K/uL 425(H) 427(H) 397   BMP Latest Ref Rng & Units 07/26/2019 07/23/2019 07/20/2019  Glucose 70 - 99 mg/dL 95 95 110(H)  BUN 6 - 20 mg/dL 28(H) 30(H) 28(H)  Creatinine 0.44 - 1.00 mg/dL 0.50 0.66 0.63  Sodium 135 - 145 mmol/L 139 137 135  Potassium 3.5 - 5.1 mmol/L 4.1 4.3 4.0  Chloride 98 - 111 mmol/L 101 101 100  CO2 22 - 32 mmol/L 29 26 26   Calcium 8.9 - 10.3 mg/dL 9.0 9.0 8.6(L)   Assessment/Plan:  Principal Problem:   MRSA bacteremia Active Problems:   Opioid overdose ([place])   Septic pulmonary embolism, bilateral    Suspected endocarditis   Active intravenous drug use   Pericardial effusion   Rash/skin eruption   Abscess of bursa of left shoulder extending into chest and upper arm    Intravenous drug abuse ([place])  Monica Stephens is a 38 year old female with opiate use disorder who presented with chest pain and  dyspnea and was found to have MRSA bacteremia.  Patient is stable and continuing treatment with IV antibiotics.  # MRSA bacteremia: Doing well and hemodynamically stable. Continuing with 6 weeks of IV vancomycin to be finished on 7/31.   # OUD: On dilaudid 1.25 mg Q3HR PRN. Plan is to give ambien at 10 pm to help with sleep. Dilaudid will be discontinued at midnight. Patient will be assessed one hour later and if in withdrawal can dose suboxone 4 mg. If doing well one hour later then re-dose another 4 mg. If pain is worse then we will continue to withhold opioids and suboxone to allow her to withdrawal and then retry suboxone.  * See Dr. Zenovia Jarred attestation from 07/27/2019 for further details  # Rash/Presumed Post Herpetic Neuralgia: - originally thought to be shingles in the ED then presumed to be a dermatologic manifestation of MRSA, unchanged since admission - given lack of improvement on antibiotics and description of burning pain, patient being treated with gabapentin  Dispo: Pending completion of IV antibiotics on 7/31.   Monica Hubert, MD 07/28/2019, 11:33 AM Pager: [phone]

## 2019-07-29 LAB — CBC
HCT: 24.9 % — ABNORMAL LOW (ref 36.0–46.0)
Hemoglobin: 7.7 g/dL — ABNORMAL LOW (ref 12.0–15.0)
MCH: 30.4 pg (ref 26.0–34.0)
MCHC: 30.9 g/dL (ref 30.0–36.0)
MCV: 98.4 fL (ref 80.0–100.0)
Platelets: 490 10*3/uL — ABNORMAL HIGH (ref 150–400)
RBC: 2.53 MIL/uL — ABNORMAL LOW (ref 3.87–5.11)
RDW: 15 % (ref 11.5–15.5)
WBC: 5.8 10*3/uL (ref 4.0–10.5)
nRBC: 0 % (ref 0.0–0.2)

## 2019-07-29 MED ORDER — BUPRENORPHINE HCL-NALOXONE HCL 2-0.5 MG SL SUBL
2.0000 | SUBLINGUAL_TABLET | Freq: Every day | SUBLINGUAL | Status: AC
  Administered 2019-07-29: 2 via SUBLINGUAL
  Filled 2019-07-29: qty 2

## 2019-07-29 MED ORDER — HYDROXYZINE HCL 25 MG PO TABS: 25.0000 mg | ORAL_TABLET | Freq: Once | ORAL | Status: AC

## 2019-07-29 NOTE — Progress Notes (Signed)
OT Cancellation Note  Patient Details Name: Monica Stephens MRN: [id number] DOB: 1981-04-22   Cancelled Treatment:    Reason Eval/Treat Not Completed: Patient declined, no reason specified, patient laying in bed reporting "I"m sick and can't do anything right now" and requested therapist to return tomorrow.   Delight Stare, OT Acute Rehabilitation Services Pager [phone] Office [phone]   Delight Stare 07/29/2019, 2:25 PM

## 2019-07-29 NOTE — Progress Notes (Signed)
PT Cancellation Note  Patient Details Name: Monica Stephens MRN: [id number] DOB: Sep 26, 1981   Cancelled Treatment:    Reason Eval/Treat Not Completed: Other (comment)(Pt refused.  Walking in halls without device Independently. )Will sign off as pt has refused x 3 and is independent with mobility and walks when she wants to.     [place] 07/29/2019, 11:03 AM  [place] Pager:  [phone]  Office:  [phone]

## 2019-07-29 NOTE — Progress Notes (Signed)
Subjective:  No acute events overnight. Patient slept fine overnight. She complains of pain this morning and requests another dose of dilaudid. She denies any sx of withdrawal including abdominal pain, nausea, vomiting, diarrhea, rhinorrhea, chills and restlessness. She reports being anxious at baseline.   Objective:  Vital signs in last 24 hours: Vitals:   07/28/19 0509 07/28/19 0831 07/28/19 1224 07/28/19 2001  BP: 102/70 97/62 98/63  101/70  Pulse: (!) 102  (!) 102 (!) 110  Resp: 11 (!) 26 19 (!) 21  Temp: 98.1 F (36.7 C)  98.2 F (36.8 C) 98.4 F (36.9 C)  TempSrc: Oral  Oral Oral  SpO2: 96% 96% 96% 97%  Weight: 43 kg     Height:       Physical Exam  Constitutional:  Thin appearing female laying in bed with hands covering face; not flushed, non diaphoretic  HENT:  Nose: No rhinorrhea.  Eyes: Pupils are equal, round, and reactive to light.  Pupils possibly larger than normal for room light  Cardiovascular: Regular rhythm. Tachycardia present.  Pulmonary/Chest: Breath sounds normal. No accessory muscle usage. No respiratory distress.  Abdominal: Soft. Normal appearance and bowel sounds are normal.  Skin: Skin is warm. Rash (erythematous rash over sternum appears slightly decreased in size with resolution of ruptured bullae; known violaceous papular rash below left breast looks decreased in size) noted.  No piloerection  Psychiatric:  Appears anxious and somewhat irritable  Nursing note and vitals reviewed.  Labs: CBC Latest Ref Rng & Units 07/28/2019 07/26/2019 07/23/2019  WBC 4.0 - 10.5 K/uL 4.8 4.7 6.2  Hemoglobin 12.0 - 15.0 g/dL 7.2(L) 7.4(L) 8.4(L)  Hematocrit 36.0 - 46.0 % 23.3(L) 24.0(L) 27.2(L)  Platelets 150 - 400 K/uL 425(H) 427(H) 397   BMP Latest Ref Rng & Units 07/26/2019 07/23/2019 07/20/2019  Glucose 70 - 99 mg/dL 95 95 110(H)  BUN 6 - 20 mg/dL 28(H) 30(H) 28(H)  Creatinine 0.44 - 1.00 mg/dL 0.50 0.66 0.63  Sodium 135 - 145 mmol/L 139 137 135  Potassium  3.5 - 5.1 mmol/L 4.1 4.3 4.0  Chloride 98 - 111 mmol/L 101 101 100  CO2 22 - 32 mmol/L 29 26 26   Calcium 8.9 - 10.3 mg/dL 9.0 9.0 8.6(L)   Assessment/Plan:  Principal Problem:   MRSA bacteremia Active Problems:   Opioid overdose (HCC)   Septic pulmonary embolism, bilateral    Suspected endocarditis   Active intravenous drug use   Pericardial effusion   Rash/skin eruption   Abscess of bursa of left shoulder extending into chest and upper arm    Intravenous drug abuse ([place])  Ms. Demelo is a 38 year old female with opiate use disorder who presented with chest pain and dyspnea and was found to have MRSA bacteremia.  Patient is stable and continuing treatment with IV antibiotics.  # MRSA bacteremia: Doing well and hemodynamically stable. Continuing with 6 weeks of IV vancomycin to be finished on 7/31.   # OUD: Dilaudid stopped overnight with plan to allow her to withdrawal slightly before initiating suboxone. Patient denies sx of withdrawal today. COWS approximately 5, consistent with mild withdrawal. Treated anxiety with hydroxyzine. Will recalculate COWS and if greater than 10 will giver her Suboxone 4 mg.   * See Dr. Zenovia Jarred attestation from 07/27/2019 for further details  # Rash/Presumed Post Herpetic Neuralgia: - originally thought to be shingles in the ED then presumed to be a dermatologic manifestation of MRSA, both decreased in size in the last few days - burning  pain being treated with gabapentin  Dispo: Pending completion of IV antibiotics on 7/31.   Al Decant, MD 07/29/2019, 9:43 AM Pager: [phone]

## 2019-07-29 NOTE — TOC Progression Note (Signed)
Transition of Care [facility]) - Progression Note    Patient Details  Name: Monica Stephens MRN: [id number] Date of Birth: [birth year]  Transition of Care [facility]) CM/SW [place], [place] Phone Number: 07/29/2019, 2:35 PM  Clinical Narrative:     CSW visit with the patient at bedside- to inquire about transportation and plan at discharge. Patient states she was not feeling and was not up to talking. Patient requested CSW return tomorrow.   Thurmond Butts, MSW, LCSWA Clinical Social Worker [phone]     Barriers to Discharge: Continued Medical Work up, Active Substance Use - Placement, Inadequate or no insurance  Expected Discharge Plan and Services   In-house Referral: Clinical Social Work     Living arrangements for the past 2 months: Homeless                                       Social Determinants of Health (SDOH) Interventions    Readmission Risk Interventions No flowsheet data found.

## 2019-07-29 NOTE — Progress Notes (Signed)
Physical Therapy Discharge Patient Details Name: Monica Stephens MRN: [id number] DOB: [birth year] Today's Date: 07/29/2019 Time:  -     Patient discharged from PT services secondary to patient has refused 3 (three) consecutive times without medical reason. Also pt is independent with mobility as well and has met goals.   Please see latest therapy progress note for current level of functioning and progress toward goals.    Progress and discharge plan discussed with patient and/or caregiver: Patient/Caregiver agrees with plan  GP     Denice Paradise 07/29/2019, 11:04 AM  [place] Pager:  [phone]  Office:  [phone]

## 2019-07-29 NOTE — Progress Notes (Signed)
Went to evaluate patient and noted that the patient had a COWS of 14. Started suboxone 4-1mg . Will re-evaluate in 1-2 hours to monitor for response.   Lars Mage, MD Internal Medicine PGY3 CRFVO:[phone] 07/29/2019, 1:28 PM

## 2019-07-30 ENCOUNTER — Telehealth: Payer: Self-pay | Admitting: Licensed Clinical Social Worker

## 2019-07-30 ENCOUNTER — Other Ambulatory Visit: Payer: Self-pay | Admitting: Internal Medicine

## 2019-07-30 MED ORDER — BUPRENORPHINE HCL-NALOXONE HCL 8-2 MG SL SUBL: SUBLINGUAL_TABLET | SUBLINGUAL | 0 refills | Status: DC

## 2019-07-30 MED ORDER — VANCOMYCIN HCL 10 G IV SOLR
1250.0000 mg | INTRAVENOUS | Status: AC
  Administered 2019-07-31: 10:00:00 1250 mg via INTRAVENOUS
  Filled 2019-07-30: qty 1250

## 2019-07-30 MED ORDER — BUPRENORPHINE HCL-NALOXONE HCL 2-0.5 MG SL SUBL
2.0000 | SUBLINGUAL_TABLET | Freq: Two times a day (BID) | SUBLINGUAL | Status: DC
  Administered 2019-07-30 – 2019-07-31 (×2): 2 via SUBLINGUAL
  Filled 2019-07-30 (×3): qty 2

## 2019-07-30 MED ORDER — BUPRENORPHINE HCL-NALOXONE HCL 2-0.5 MG SL SUBL
2.0000 | SUBLINGUAL_TABLET | Freq: Once | SUBLINGUAL | Status: AC
  Administered 2019-07-30: 03:00:00 2 via SUBLINGUAL
  Filled 2019-07-30: qty 2

## 2019-07-30 MED FILL — BUPRENORPHIN-NALOXON 8-2 MG: 8-2 | 3 days supply | Qty: 5 | Fill #0

## 2019-07-30 NOTE — Progress Notes (Signed)
Monica Stephens was seen after being paged by her RN at 0245, for stating she will leave AMA if she does not receive her Suboxone treatment. She was interviewed by myself and Dr. Gilberto Better. She states that she is very irritated because this is not how her previous suboxone treatments went. She states that after the initial dose of suboxone, she is supposed to get it every 12 hours and not based on "some score to let me withdrawal." We explained the reasoning behind her treatment regime.   After careful consideration and examination she had a COW score of 8 (HR 130, extreme irritability) and we ordered her next round of suboxone.

## 2019-07-30 NOTE — Progress Notes (Signed)
Would you pls phone in to [place] pharmacy?

## 2019-07-30 NOTE — TOC Progression Note (Signed)
Transition of Care [facility]) - Progression Note    Patient Details  Name: Monica Stephens MRN: [id number] Date of Birth: 1981-04-26  Transition of Care [facility]) CM/SW [place], [place] Phone Number: 07/30/2019, 2:59 PM  Clinical Narrative:     CSW met with the patient in her room. Patient just finished a walk in te hall. Patient appeared to be in a better mood today then when CSW visited yesterday. She was smiling, engaged in conversation, and states she feels a lot better. Patient reports she is discharging tomorrow and her mother is coming to pick her up. Patient states after discharge she will be going to the [facility] in [place]. CSW inquired how she feels about that, patient states she is excited. Patient states no needs.  Clinical Social Worker will sign off for now as social work intervention is no longer needed. Please consult [place] if new need arises.    Thurmond Butts, LCSWA Clinical Social Worker [phone]       Barriers to Discharge: Continued Medical Work up, Active Substance Use - Placement, Inadequate or no insurance  Expected Discharge Plan and Services   In-house Referral: Clinical Social Work     Living arrangements for the past 2 months: Homeless                                       Social Determinants of Health (SDOH) Interventions    Readmission Risk Interventions No flowsheet data found.

## 2019-07-30 NOTE — Progress Notes (Signed)
Pharmacy Antibiotic Note  Monica Stephens is a 38 y.o. female admitted on 06/14/2019 with MRSA bacteremia and endocarditis.  Pharmacy has been consulted for vancomycin dosing.  Renal function is stable and previous vancomycin levels were within desired range.  Afebrile, WBC WNL.  Plan: Continue vanc 1250mg  IV Q24H through 07/31/19 Monitor renal fxn, clinical progress No plan to repeat vanc AUC given therapy will end this week, unless renal fxn worsens  Height: 5\' 2"  (157.5 cm) Weight: 95 lb 8 oz (43.3 kg) IBW/kg (Calculated) : 50.1  Temp (24hrs), Avg:98.2 F (36.8 C), Min:98.1 F (36.7 C), Max:98.2 F (36.8 C)  Recent Labs  Lab 07/24/19 1000 07/26/19 0833 07/28/19 0552 07/29/19 1000  WBC  --  4.7 4.8 5.8  CREATININE  --  0.50  --   --   VANCOTROUGH 5*  --   --   --     Estimated Creatinine Clearance: 66.5 mL/min (by C-G formula based on SCr of 0.5 mg/dL).    No Known Allergies   6/14 Vanc>> 7/31 (minimum 6 weeks per ID ) 6/14 Cefepime X 1 6/14 Acyclovir X 1  7/4 CTX >> 7/8 d/c'd  6/19 VP/VT 26/13, AUC = 457 on 500mg  q12 >> 1g q24 for convenience outpt 6/26 VP/VT 27/10, AUC = 458  7/3 VP/VT 31/5, AUC = 367 >> 500mg  IV x1, then incr to 1250mg /24 7/10 VP/VT 37/11, AUC = 452 - no change 7/17 VP/VT 33/8, AUC = 486.8 - no change 7/24 VT 5 (drawn slightly late ~1h) - no change  6/14 Bcx x 4/4: staph aureus (BCID MRSA) 6/14 COVID: neg  6/14 Ucx: MRSA 6/16 Wound: MRSA 6/17 Bcx: MRSA 6/20 Bcx: NGF 6/24 Pleual fluid: NGF 6/24 Pleural fluid fungus: NGTD 6/24 COVID: neg  6/29: Bcx: NGTD 7/3 Abscess left chest: MRSA 7/7 surgical PCR: MRSA pcr neg; SA neg  Berenice Bouton, PharmD PGY1 Pharmacy Resident Office phone: [phone]  07/30/2019, 10:45 AM

## 2019-07-30 NOTE — Progress Notes (Signed)
Patient upset because she is not receiving Suboxone every 12 hours. She states not understanding the reasoning behind it and needs to stay ahead of withdrawals symptoms. Last dose given on 07/29/2019 at 1440 due to COWS of 14 per IM. Current COWS is 3, HR 102, RR 25, BP 92/61. Paged IM about patients concerns. Will continue to monitor patient closely.

## 2019-07-30 NOTE — Progress Notes (Signed)
   Subjective:  Patient feeling well on current suboxone dose. Set up to go to [place] tomorrow after discharge. Feeling down given her economic situation and family dynamics. She is concerned about price of suboxone. We discussed having our counselor give her a call while inpatient today.  Objective:  Vital signs in last 24 hours: Vitals:   07/29/19 1300 07/29/19 1923 07/30/19 0039 07/30/19 0441  BP: 115/69 111/76 92/61   Pulse:  (!) 108    Resp: 16 16 15 16   Temp: 98.1 F (36.7 C) 98.2 F (36.8 C)    TempSrc: Axillary Oral    SpO2:  97%    Weight:    43.3 kg  Height:       Physical Exam  Constitutional: No distress.  Thin appearing female sitting up comfortably in bed  HENT:  Nose: No rhinorrhea.  Eyes: Pupils are equal, round, and reactive to light.  Cardiovascular: Regular rhythm. Tachycardia present.  Pulmonary/Chest: Effort normal and breath sounds normal. No accessory muscle usage. No respiratory distress.  Abdominal: Soft. Normal appearance and bowel sounds are normal. She exhibits no distension. There is no abdominal tenderness.  Skin: Skin is warm. Rash (erythematous rash over sternum with resolution of ruptured bullae; known violaceous papular rash below left breast ) noted. She is not diaphoretic.  Nursing note and vitals reviewed.  Labs: CBC Latest Ref Rng & Units 07/29/2019 07/28/2019 07/26/2019  WBC 4.0 - 10.5 K/uL 5.8 4.8 4.7  Hemoglobin 12.0 - 15.0 g/dL 7.7(L) 7.2(L) 7.4(L)  Hematocrit 36.0 - 46.0 % 24.9(L) 23.3(L) 24.0(L)  Platelets 150 - 400 K/uL 490(H) 425(H) 427(H)   BMP Latest Ref Rng & Units 07/26/2019 07/23/2019 07/20/2019  Glucose 70 - 99 mg/dL 95 95 110(H)  BUN 6 - 20 mg/dL 28(H) 30(H) 28(H)  Creatinine 0.44 - 1.00 mg/dL 0.50 0.66 0.63  Sodium 135 - 145 mmol/L 139 137 135  Potassium 3.5 - 5.1 mmol/L 4.1 4.3 4.0  Chloride 98 - 111 mmol/L 101 101 100  CO2 22 - 32 mmol/L 29 26 26   Calcium 8.9 - 10.3 mg/dL 9.0 9.0 8.6(L)   Assessment/Plan:   Principal Problem:   MRSA bacteremia Active Problems:   Opioid overdose (HCC)   Septic pulmonary embolism, bilateral    Suspected endocarditis   Active intravenous drug use   Pericardial effusion   Rash/skin eruption   Abscess of bursa of left shoulder extending into chest and upper arm    Intravenous drug abuse ([place])  Ms. Kueker is a 38 year old female with opiate use disorder who presented with chest pain and dyspnea and was found to have MRSA bacteremia.  Patient is stable and continuing treatment with IV antibiotics.  # MRSA bacteremia: Doing well and hemodynamically stable. Continuing with 6 weeks of IV vancomycin to be finished on 7/31.   # OUD: transitioned to suboxone yesterday; suboxone 4 mg BID at 10 and 10   Dispo: Pending completion of IV antibiotics on 7/31. Close fu in suboxone clinic on Tuesday, August 4th.  Al Decant, MD 07/30/2019, 12:04 PM Pager: [phone]

## 2019-07-30 NOTE — Progress Notes (Signed)
Pt practiced walking in the stairwell. Climbed 3 flights of stairs up and down. Tolerated very well.
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
## 2019-07-30 NOTE — Telephone Encounter (Signed)
Patient was contacted due to a request from her doctor. Patient was tired, and did not want to complete the call at that time.

## 2019-07-30 NOTE — Progress Notes (Signed)
Patient upset and stating that she will leave AMA if she does not receive suboxone dose. Repaged and talked to internal medicine about patient concerns. IM will come to bedside and talk with patient.

## 2019-07-31 DIAGNOSIS — F419 Anxiety disorder, unspecified: Secondary | ICD-10-CM

## 2019-07-31 MED ORDER — LEVOTHYROXINE SODIUM 75 MCG PO TABS: 225.0000 ug | ORAL_TABLET | ORAL | 0 refills | Status: DC

## 2019-07-31 MED ORDER — GABAPENTIN 600 MG PO TABS: 300.0000 mg | ORAL_TABLET | Freq: Three times a day (TID) | ORAL | 0 refills | Status: DC

## 2019-07-31 MED ORDER — DULOXETINE HCL 60 MG PO CPEP: 60.0000 mg | ORAL_CAPSULE | Freq: Every day | ORAL | 0 refills | Status: DC

## 2019-07-31 MED ORDER — LEVOTHYROXINE SODIUM 150 MCG PO TABS: 150.0000 ug | ORAL_TABLET | Freq: Every day | ORAL | 0 refills | Status: DC

## 2019-07-31 MED ORDER — TRAZODONE HCL 100 MG PO TABS: 100.0000 mg | ORAL_TABLET | Freq: Every day | ORAL | 0 refills | Status: DC

## 2019-07-31 MED FILL — GABAPENTIN 300 MG CAPSULE: 300 | 30 days supply | Qty: 90 | Fill #0

## 2019-07-31 MED FILL — DULoxetine HCL 60 MG CPEP: 60 | 30 days supply | Qty: 30 | Fill #0

## 2019-07-31 MED FILL — LEVOTHYROXINE 150 MCG TAB: 150 | 30 days supply | Qty: 30 | Fill #0

## 2019-07-31 MED FILL — traZODone HCL 100 MG TABS: 100 | 30 days supply | Qty: 30 | Fill #0

## 2019-07-31 MED FILL — LEVOTHYROXINE 75 MCG TABLET: 75 | 30 days supply | Qty: 90 | Fill #0

## 2019-07-31 NOTE — Progress Notes (Signed)
   Subjective:  Patient feeling well and ready for discharge. We discussed her outpatient medications and case management will come talk with her about pricing. She had no other complaints or concerns.  Objective:  Vital signs in last 24 hours: Vitals:   07/31/19 0326 07/31/19 0423 07/31/19 0650 07/31/19 0932  BP: (!) 93/59  100/70   Pulse: (!) 103  (!) 151   Resp: (!) 21 16 (!) 22 16  Temp: 98.2 F (36.8 C)  98.2 F (36.8 C)   TempSrc: Oral  Oral   SpO2: 95%  91%   Weight:   44.2 kg   Height:       Physical Exam  Constitutional: No distress.  Thin appearing female sitting up comfortably in the chair  HENT:  Nose: No rhinorrhea.  Eyes: Pupils are equal, round, and reactive to light.  Cardiovascular: Regular rhythm. Tachycardia present.  Pulmonary/Chest: Effort normal and breath sounds normal. No accessory muscle usage. No respiratory distress.  Abdominal: Soft. Normal appearance and bowel sounds are normal. She exhibits no distension. There is no abdominal tenderness.  Skin: Skin is warm. Rash (erythematous rash over sternum with resolution of ruptured bullae; known violaceous papular rash below left breast ) noted. She is not diaphoretic.  Nursing note and vitals reviewed.  Labs: CBC Latest Ref Rng & Units 07/29/2019 07/28/2019 07/26/2019  WBC 4.0 - 10.5 K/uL 5.8 4.8 4.7  Hemoglobin 12.0 - 15.0 g/dL 7.7(L) 7.2(L) 7.4(L)  Hematocrit 36.0 - 46.0 % 24.9(L) 23.3(L) 24.0(L)  Platelets 150 - 400 K/uL 490(H) 425(H) 427(H)   BMP Latest Ref Rng & Units 07/26/2019 07/23/2019 07/20/2019  Glucose 70 - 99 mg/dL 95 95 110(H)  BUN 6 - 20 mg/dL 28(H) 30(H) 28(H)  Creatinine 0.44 - 1.00 mg/dL 0.50 0.66 0.63  Sodium 135 - 145 mmol/L 139 137 135  Potassium 3.5 - 5.1 mmol/L 4.1 4.3 4.0  Chloride 98 - 111 mmol/L 101 101 100  CO2 22 - 32 mmol/L 29 26 26   Calcium 8.9 - 10.3 mg/dL 9.0 9.0 8.6(L)   Assessment/Plan:  Principal Problem:   MRSA bacteremia Active Problems:   Opioid overdose (HCC)    Septic pulmonary embolism, bilateral    Suspected endocarditis   Active intravenous drug use   Pericardial effusion   Rash/skin eruption   Abscess of bursa of left shoulder extending into chest and upper arm    Intravenous drug abuse ([place])  Monica Stephens is a 38 year old female with opiate use disorder who presented with chest pain and dyspnea and was found to have MRSA bacteremia.  Patient is stable and completed her 6 week course of vancomycin.   # MRSA bacteremia: Doing well and hemodynamically stable. Received last dose of IV vancomycin today and is ready for discharge.  # OUD: tolerating 4 mg suboxone BID well with prescription sent to outpatient pharmacy  Dispo: D/c today after completion of IV antibiotics; case management coming by to discuss outpatient prescription pricing with patient prior to d/c  Al Decant, MD 07/31/2019, 11:45 AM Pager: [phone]

## 2019-07-31 NOTE — Care Management (Signed)
1253 07-31-19 Case Manager notified MD Madilyn Fireman that if the patient will transition out of the hospital today, the medications need to be placed in Epic to be e-scribed to [facility]-so the override can be done for [facility] by our case manager in a timely manner, so medications will be delivered to the patient. CM will continue to monitor for additional transition of care needs. Bethena Roys, RN BSN Case Manager [phone]

## 2019-07-31 NOTE — Discharge Instructions (Signed)
You were admitted to the hospital with an infection in your blood from MRSA. You were treated with 6 weeks of IV antibiotics. While you were here you had pain from your infection and from a fractured sternum that was treated with IV opioids for which we transitioned you to suboxone prior to discharge. Please follow up in our suboxone clinic on August 4th at [place] clinic will call you to schedule a general hospital follow up appointment as well.

## 2019-08-04 ENCOUNTER — Ambulatory Visit: Payer: Self-pay

## 2019-08-04 ENCOUNTER — Telehealth: Payer: Self-pay | Admitting: *Deleted

## 2019-08-04 ENCOUNTER — Encounter: Payer: Self-pay | Admitting: General Practice

## 2019-08-04 NOTE — Telephone Encounter (Signed)
Attempted to call pt and her mother to f/u on missed appt, no answer, lm for rtc

## 2019-08-05 NOTE — Telephone Encounter (Signed)
Attempted to call pt, lm for rtc. Called mother's #, she states pt has decided suboxone is not for her she is planning on taking care of her addiction in her own way Her mother does state she has developed"a new blister", she also states pt has new ph#, I have changed it in system and she wants to make a HFU appt. Called the new # and no answer. Her mother was asked to please tell her that she may call and make an appt w/ oud if she changes her mind

## 2019-08-06 LAB — ACID FAST CULTURE WITH REFLEXED SENSITIVITIES (MYCOBACTERIA): Acid Fast Culture: NEGATIVE

## 2019-08-06 NOTE — Telephone Encounter (Signed)
Thank you Monica Stephens. She can still come to [facility] for thyroid mgmt

## 2019-08-10 ENCOUNTER — Telehealth: Payer: Self-pay | Admitting: *Deleted

## 2019-08-10 ENCOUNTER — Other Ambulatory Visit: Payer: Self-pay

## 2019-08-10 ENCOUNTER — Emergency Department (HOSPITAL_COMMUNITY): Payer: Self-pay

## 2019-08-10 ENCOUNTER — Emergency Department (HOSPITAL_COMMUNITY)
Admission: EM | Admit: 2019-08-10 | Discharge: 2019-08-11 | Disposition: A | Discharge status: Home / Self Care | Payer: Self-pay | Attending: Emergency Medicine | Admitting: Emergency Medicine

## 2019-08-10 ENCOUNTER — Encounter (HOSPITAL_COMMUNITY): Payer: Self-pay

## 2019-08-10 DIAGNOSIS — E890 Postprocedural hypothyroidism: Secondary | ICD-10-CM | POA: Insufficient documentation

## 2019-08-10 DIAGNOSIS — R0602 Shortness of breath: Secondary | ICD-10-CM | POA: Insufficient documentation

## 2019-08-10 DIAGNOSIS — X58XXXD Exposure to other specified factors, subsequent encounter: Secondary | ICD-10-CM | POA: Insufficient documentation

## 2019-08-10 DIAGNOSIS — R0789 Other chest pain: Secondary | ICD-10-CM | POA: Insufficient documentation

## 2019-08-10 DIAGNOSIS — F17210 Nicotine dependence, cigarettes, uncomplicated: Secondary | ICD-10-CM | POA: Insufficient documentation

## 2019-08-10 DIAGNOSIS — Z20828 Contact with and (suspected) exposure to other viral communicable diseases: Secondary | ICD-10-CM | POA: Insufficient documentation

## 2019-08-10 DIAGNOSIS — R071 Chest pain on breathing: Secondary | ICD-10-CM | POA: Insufficient documentation

## 2019-08-10 DIAGNOSIS — S2220XG Unspecified fracture of sternum, subsequent encounter for fracture with delayed healing: Secondary | ICD-10-CM | POA: Insufficient documentation

## 2019-08-10 DIAGNOSIS — R079 Chest pain, unspecified: Secondary | ICD-10-CM

## 2019-08-10 LAB — BASIC METABOLIC PANEL
Anion gap: 9 (ref 5–15)
BUN: 12 mg/dL (ref 6–20)
CO2: 27 mmol/L (ref 22–32)
Calcium: 9.1 mg/dL (ref 8.9–10.3)
Chloride: 102 mmol/L (ref 98–111)
Creatinine, Ser: 0.72 mg/dL (ref 0.44–1.00)
GFR calc Af Amer: >60 mL/min (ref >60)
GFR calc non Af Amer: >60 mL/min (ref >60)
Glucose, Bld: 101 mg/dL — ABNORMAL HIGH (ref 70–99)
Potassium: 3.5 mmol/L (ref 3.5–5.1)
Sodium: 138 mmol/L (ref 135–145)

## 2019-08-10 LAB — CBC
HCT: 28.9 % — ABNORMAL LOW (ref 36.0–46.0)
Hemoglobin: 8.6 g/dL — ABNORMAL LOW (ref 12.0–15.0)
MCH: 28.7 pg (ref 26.0–34.0)
MCHC: 29.8 g/dL — ABNORMAL LOW (ref 30.0–36.0)
MCV: 96.3 fL (ref 80.0–100.0)
Platelets: 390 10*3/uL (ref 150–400)
RBC: 3 MIL/uL — ABNORMAL LOW (ref 3.87–5.11)
RDW: 15.1 % (ref 11.5–15.5)
WBC: 5.5 10*3/uL (ref 4.0–10.5)
nRBC: 0 % (ref 0.0–0.2)

## 2019-08-10 LAB — I-STAT BETA HCG BLOOD, ED (MC, WL, AP ONLY): I-stat hCG, quantitative: <5 m[IU]/mL (ref <5)

## 2019-08-10 LAB — TROPONIN I (HIGH SENSITIVITY)
Troponin I (High Sensitivity): <2 ng/L (ref <18)
Troponin I (High Sensitivity): <2 ng/L (ref <18)

## 2019-08-10 IMAGING — CT CT ANGIOGRAPHY CHEST
1 of 6 series · 4 of 16 positions shown · IV contrast (omnipaque)
Comparison: Multiple priors, most recent [DATE].

CLINICAL DATA: Recent emission for endocarditis, worsening
shortness of breath, history of sternal fracture

EXAM:
CT ANGIOGRAPHY CHEST WITH CONTRAST
TECHNIQUE: Multidetector CT imaging of the chest was performed using the
standard protocol during bolus administration of intravenous
contrast. Multiplanar CT image reconstructions and MIPs were
obtained to evaluate the vascular anatomy.
CONTRAST:  75mL OMNIPAQUE IOHEXOL 350 MG/ML SOLN

[Series 7: pe thins · axial · 0.63mm/px · z∈[+172,+315]mm · 4 of 342 slices shown]
[im 69/342  lung]
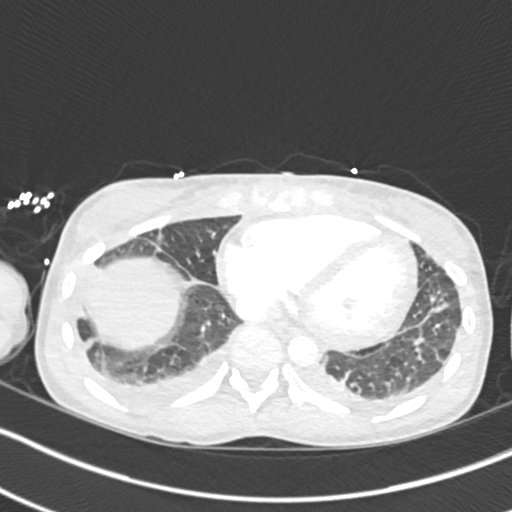
[im 137/342  soft-tissue]
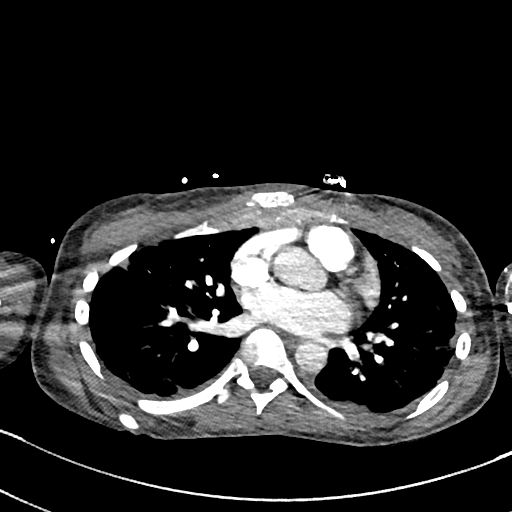
[im 205/342  lung]
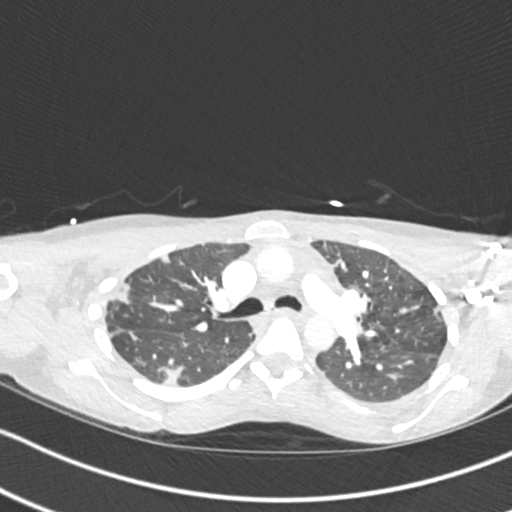
[im 273/342  soft-tissue]
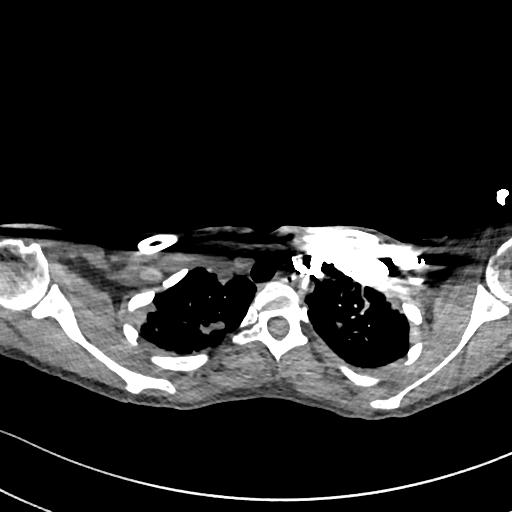

[4 of 16 positions shown; findings below may reference images not displayed]

FINDINGS: Cardiovascular: There is a optimal opacification of the pulmonary
arteries. There is no central,segmental, or subsegmental filling
defects within the pulmonary arteries. The heart is normal in size.
No pericardial effusion thickening. No evidence right heart strain.
There is normal three-vessel brachiocephalic anatomy without
proximal stenosis. The thoracic aorta is normal in appearance.

Mediastinum/Nodes: No hilar, mediastinal, or axillary adenopathy.
Thyroid gland, trachea, and esophagus demonstrate no significant
findings.

Lungs/Pleura: Again noted are peripheral subpleural patchy rounded
nodular opacities throughout both lungs, they appear slightly less
prominent than the prior exam. There is small right and trace left
pleural effusion present.

Upper Abdomen: No acute abnormalities present in the visualized
portions of the upper abdomen.

Musculoskeletal: Again noted is a comminuted anteriorly displaced
sternal body fracture. There appears to be slight interval worsening
in the mottled patchy appearance to the sternal body. There appears
to be areas of cortical irregularity which are new from the prior
exam. Beneath the sternal body there is heterogeneous soft tissue
thickening no definite loculated fluid collection however is seen.

Review of the MIP images confirms the above findings.
IMPRESSION: 1. No central, segmental, or subsegmental pulmonary embolism.
2. Again noted patchy subpleural pulmonary nodular opacities which
are slightly improved from the prior exam, and could be due to
multifocal pneumonia and/or septic emboli
3. Small right and trace left pleural effusion
4. Comminuted mildly displaced sternal body fracture as on the
prior. There is interval worsening in the mottled appearance to the
sternal body with areas of cortical irregularity. This could be due
to osteomyelitis given the patient's history.

## 2019-08-10 IMAGING — DX CHEST - 2 VIEW
2 series · 2 of 2 positions shown · non-contrast
Comparison: [DATE]

CLINICAL DATA: Chest pain

EXAM:
CHEST - 2 VIEW

[chest pa]
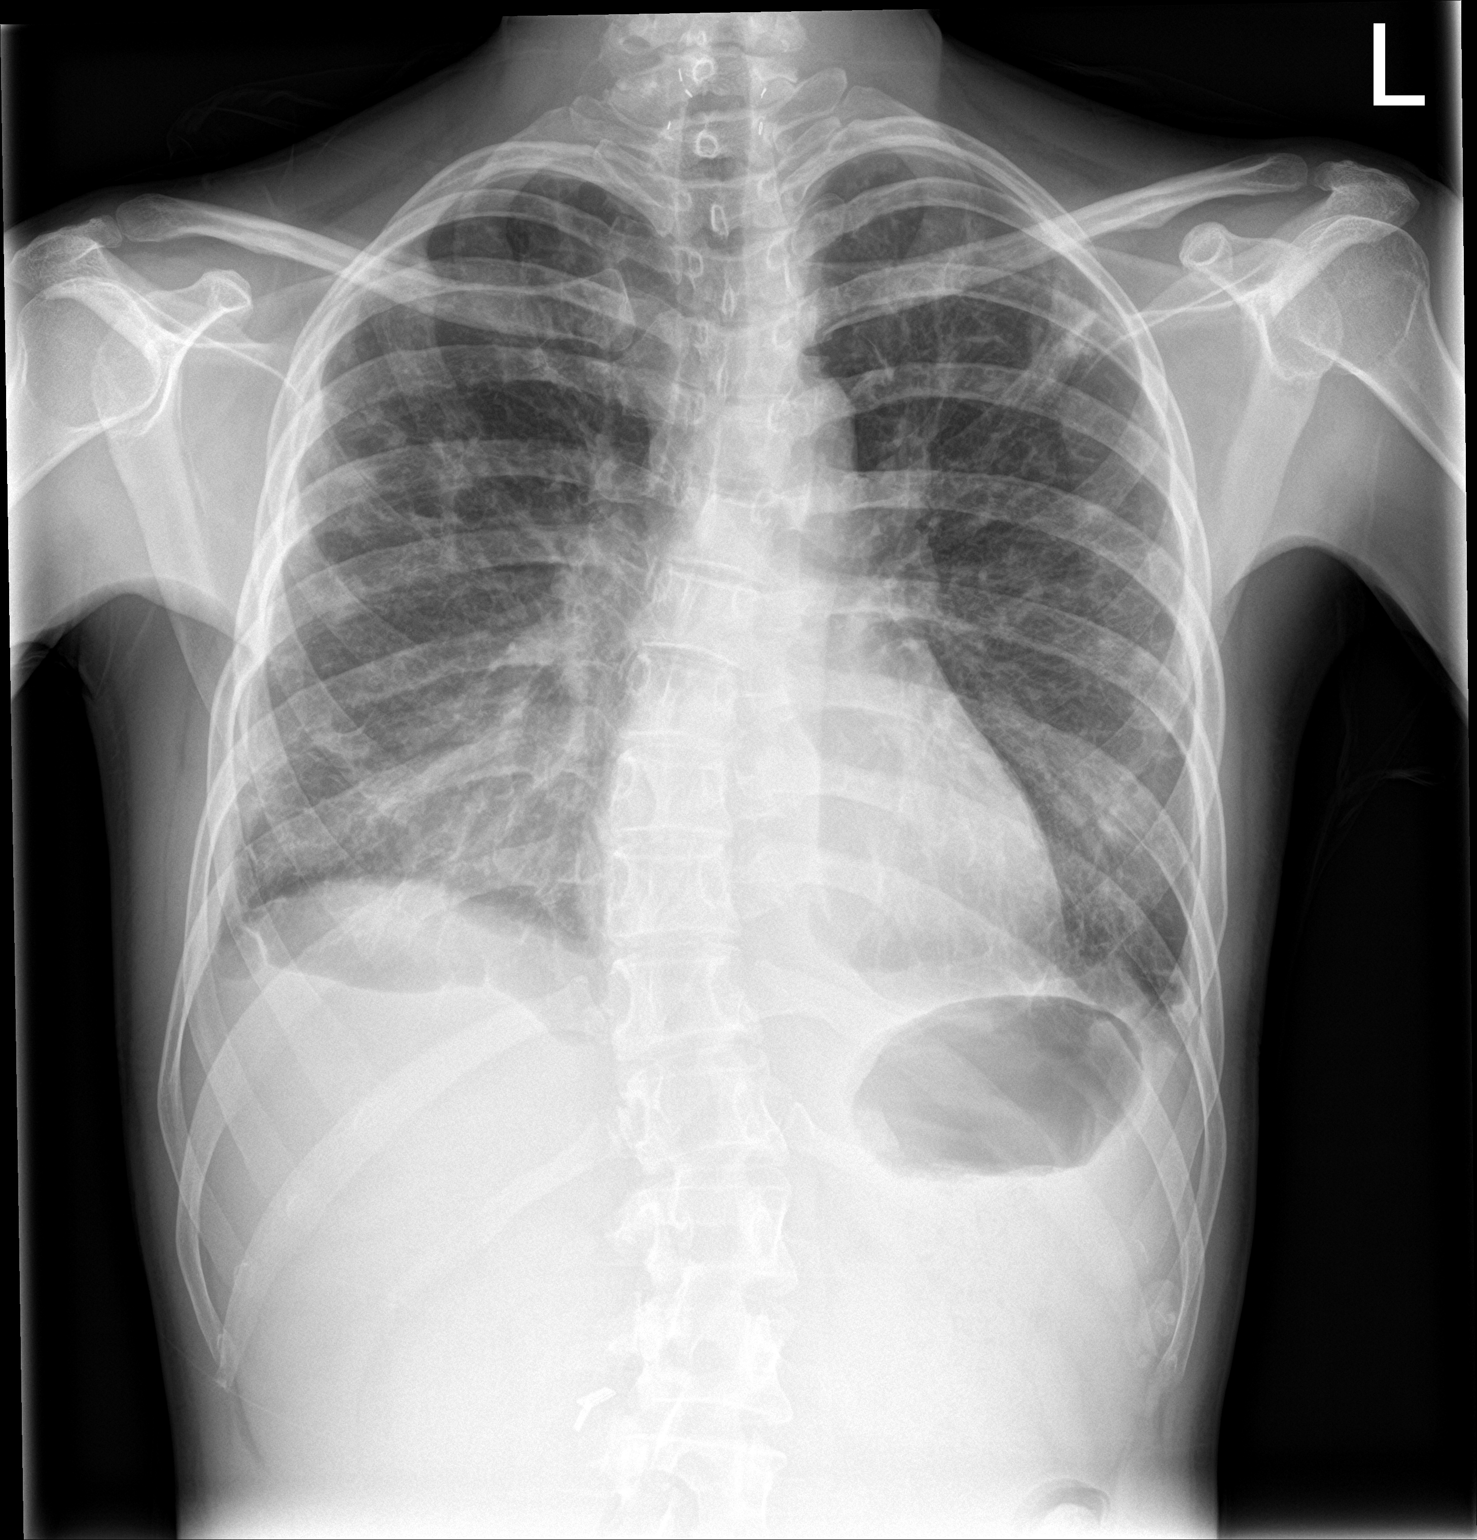

[chest lat]
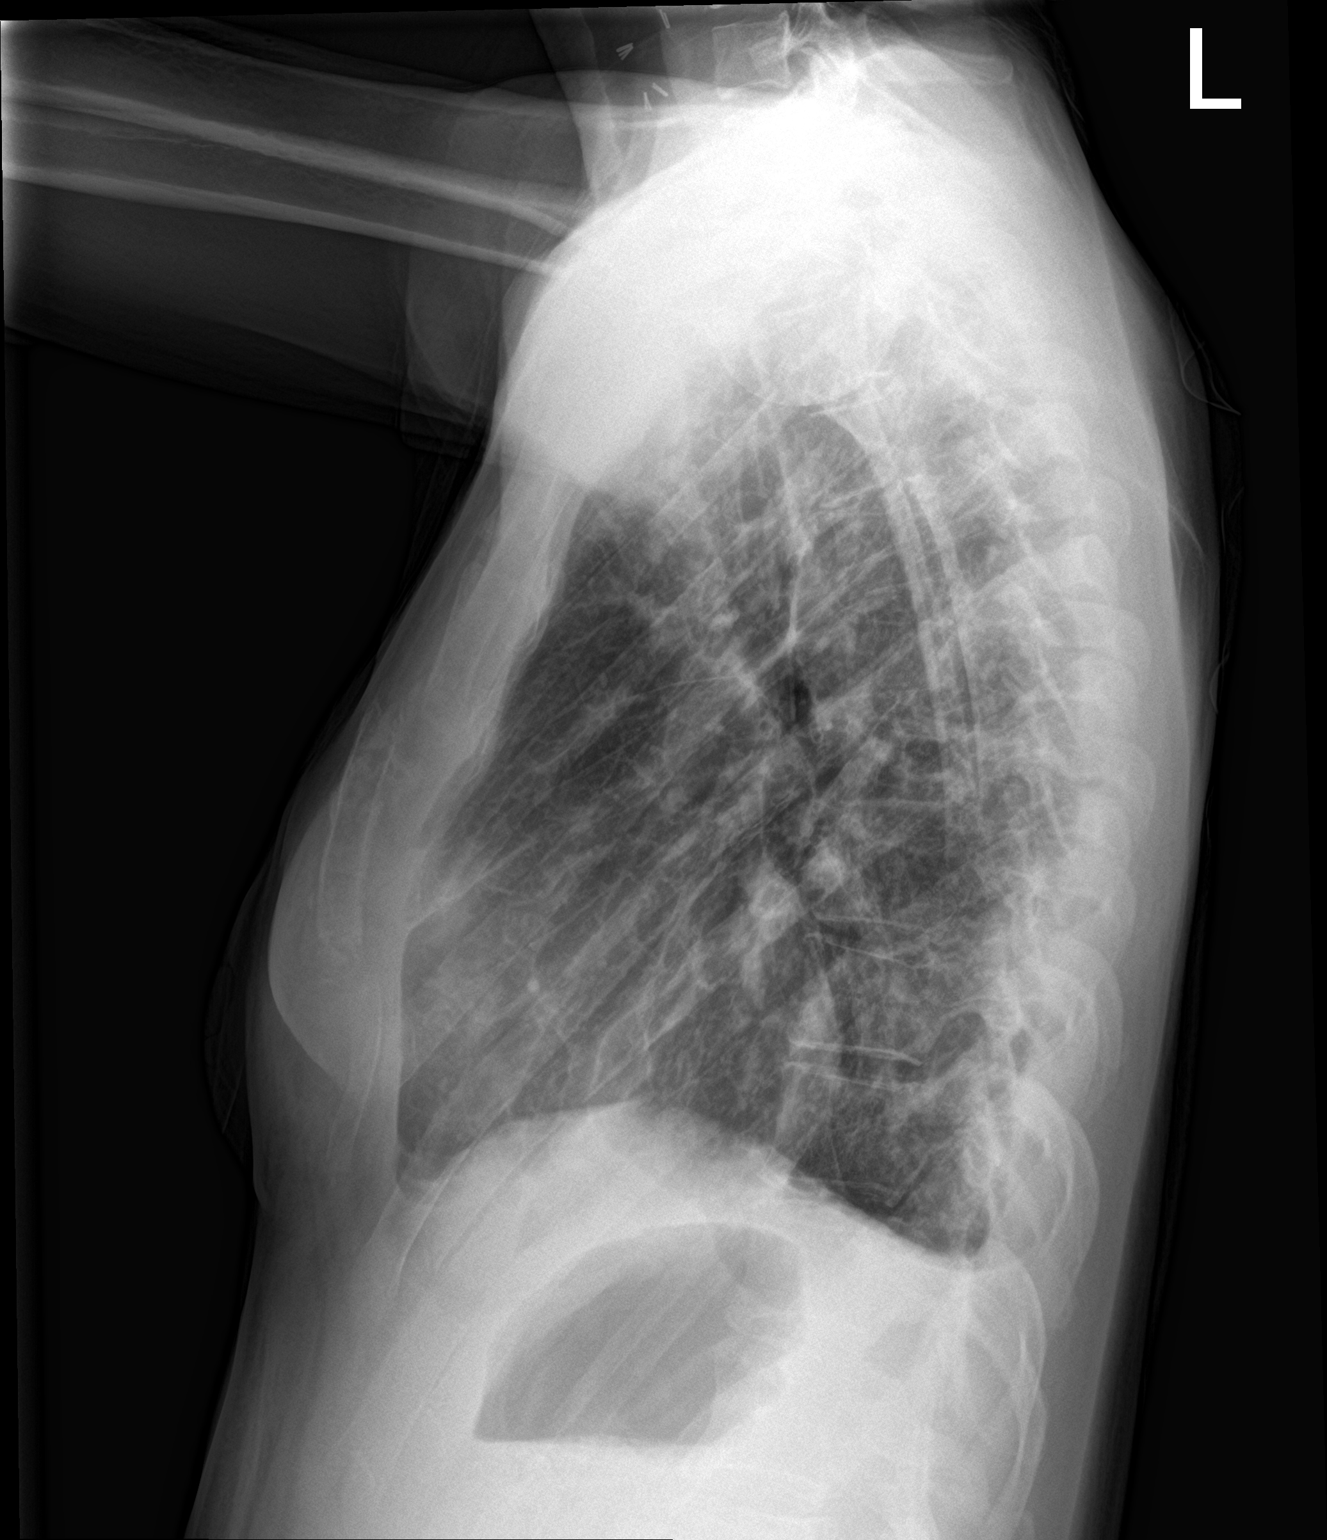

[2 of 2 positions shown; findings below may reference images not displayed]

FINDINGS: Again identified are multifocal airspace opacities, very similar to
prior chest x-ray dated [DATE]. There are small bilateral
pleural effusions, right greater than left. These have improved
slightly since the prior study. The right-sided PICC line has been
removed. There is no pneumothorax. The heart size is unchanged.
There is no acute osseous abnormality. Again noted is a displaced
fracture of the sternum.
IMPRESSION: 1. Persistent multifocal airspace opacities bilaterally, similar to
prior chest x-ray. These are in keeping with the patient's history
of septic emboli.
2. Small bilateral pleural effusions, right greater than left,
improved from prior study.
3. Again noted is a displaced fracture of the sternum with no
significant interval healing.

## 2019-08-10 MED ORDER — KETOROLAC TROMETHAMINE 15 MG/ML IJ SOLN
15.0000 mg | Freq: Once | INTRAMUSCULAR | Status: AC
  Administered 2019-08-10: 15 mg via INTRAVENOUS
  Filled 2019-08-10: qty 1

## 2019-08-10 MED ORDER — IOHEXOL 350 MG/ML SOLN
75.0000 mL | Freq: Once | INTRAVENOUS | Status: AC | PRN
  Administered 2019-08-10: 75 mL via INTRAVENOUS
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
## 2019-08-10 NOTE — Telephone Encounter (Signed)
Asked by front office to take chest pain call. Pt stated she has been having chest pain since been discharged from the hospital. Stated left side chest pain; "over her heart". "Stabbing me like a knife". Also c/o sob, heart racing when lying down. Also c/o rash on chest/abd. Informed pt to go to the ER. Stated she may ride the bus; informed she should not be on a bus with chest pain and to call 911. She stated ok.

## 2019-08-10 NOTE — ED Notes (Signed)
Pt back in waiting room 

## 2019-08-10 NOTE — Telephone Encounter (Signed)
Thank you! Agree with ED.

## 2019-08-10 NOTE — ED Notes (Signed)
Pt seen walking out towards parking lot

## 2019-08-10 NOTE — ED Triage Notes (Signed)
Pt arrives POV for eval of ongoing CP. Pt reportsshe was recentlyjust admitted for 7 weeks for endocarditis. DC'd 2 days PTA. Pt reports worsening SOB, blistering to chest/abd. Pt reports she has not used drugs since DC.

## 2019-08-10 NOTE — ED Notes (Signed)
Called for pt no answer x1

## 2019-08-11 LAB — SARS CORONAVIRUS 2 (TAT 6-24 HRS): SARS Coronavirus 2: NEGATIVE

## 2019-08-11 MED ORDER — LIDOCAINE 5 % EX PTCH: 1.0000 | MEDICATED_PATCH | CUTANEOUS | 0 refills | Status: DC

## 2019-08-11 MED ORDER — NAPROXEN 500 MG PO TABS: 500.0000 mg | ORAL_TABLET | Freq: Two times a day (BID) | ORAL | 0 refills | Status: DC

## 2019-08-11 NOTE — Discharge Instructions (Signed)
You were evaluated in the Emergency Department and after careful evaluation, we did not find any emergent condition requiring admission or further testing in the hospital.  Your symptoms today seem to be due to poor healing of your sternum fracture, possibly related to an infection of the bone.  Your testing today was otherwise reassuring and you will be contacted by your internal medicine doctors to be seen in clinic, likely to be restarted on antibiotics.  Please return to the Emergency Department if you experience any worsening of your condition.  We encourage you to follow up with a primary care provider.  Thank you for allowing [place] to be a part of your care.

## 2019-08-11 NOTE — Progress Notes (Signed)
Paged by EDP for pain medication recommendations. Patient stable for discharge and wanting to go home. I am very familiar with the patient as I cared for her the last 4 weeks of her recent hospital admission. Reviewed patient's chart.   Monica Stephens is a 38 yo female with a history of OUD and follicular thyroid cancer s/p thyroidectomy who was recently discharged from our service on 7/31 after a 6 week hospitalization for MRSA bacteremia that was treated as infective endocarditis due to numerous septic pulmonary emboli seen on CT. She also had multiple intra muscular abscesses that were not drained due to location and small size. She was treated with IV vancomycin x 6 weeks and discharged home on Suboxone 4 mg BID for pain control. She had a follow up appt in our Suboxone clinic 4 days after discharged. She did not show. Per clinic staff notes, they were able to reach patient's mother who stated patient decided Suboxone was not working for her and wanted to work on her OUD herself (see telephone encounter 8/5). She was advised she could make an appt with [place] in [facility] or [facility] at any time.  Yesterday (8/10) afternoon, patient called clinic complaining of L-sided, stabbing chest pain associated with shortness of breath and feeling of heart racing. She also reported a rash over her chest and abdomen. This is chronic and was present during recent admission. She was asked to go to the ED for further evaluation.   In the ED: T 97.7, HR 106 --> 70s, BP 190-131/70-80s, and RR 15 with O2 sat 98-100% on RA. She reported no IV drug use since discharge. She is not on any medications for OUD, but it is going to [facility] daily. Workup as below:   CBC - WBC 5.5, Hgb 8.6 (BL 7-8), Plt 390 BMP - Glucose 101, otherwise unremarkable  Hs Trop - < 2  x2 Bcx collected  EKG: possible LAD, Q waved on inferolateral leads (similar to prior), no ST changes, and normal intervals  CTA chest: No PE, improved patchy opacities from septic  emboli, small R and trace L pleural effusion (chronic), previous sternal fracture with changes concerning for osteomyelitis   Recommendations:  - Will send message to [facility] to schedule follow up appt. We should re-discuss Suboxone for pain management at that time.  - NSAIDs + Tylenol for now for pain control  - Follow up blood cultures. Blood work appears reassuring, but she did have multiple muscular abscess that could not be drained and even though she completed 6 weeks of IV antibiotics it is still possible she could develop bacteremia again  - Consider MRI for further evaluation of sternal changes. Will curbside ID in AM for further recommendations - Discussed with Dr. Heber [place] after patient was discharged from the ED

## 2019-08-16 LAB — CULTURE, BLOOD (SINGLE): Culture: NO GROWTH

## 2019-08-17 NOTE — Telephone Encounter (Signed)
Made one last call to pt, she answered ph today, offered appt for thyroid, oud, financial counselor, offered to work with her for housing, medication, food. She states she is trying to get into detox in [facility]. She states she would like the help but not today, states she will call back. Informed  dr Software engineer of conversation. Will close this note and let pt call when she is ready

## 2019-08-17 NOTE — Telephone Encounter (Signed)
Pt called and ask for appt tomorrow 8/18 in oud, gave her 0945 appt, will confirm this appt this pm as she is still trying to get inpt at [facility]

## 2019-09-02 ENCOUNTER — Other Ambulatory Visit: Payer: Self-pay

## 2019-09-02 ENCOUNTER — Encounter: Payer: Self-pay | Admitting: Internal Medicine

## 2019-09-02 ENCOUNTER — Ambulatory Visit (INDEPENDENT_AMBULATORY_CARE_PROVIDER_SITE_OTHER): Payer: Self-pay | Admitting: Internal Medicine

## 2019-09-02 ENCOUNTER — Ambulatory Visit (HOSPITAL_COMMUNITY)
Admission: RE | Admit: 2019-09-02 | Discharge: 2019-09-02 | Disposition: A | Discharge status: Home / Self Care | Payer: Self-pay | Source: Ambulatory Visit | Attending: Internal Medicine | Admitting: Internal Medicine

## 2019-09-02 VITALS — BP 127/82 | HR 117 | Temp 98.9°F | Ht 62.0 in | Wt 95.1 lb

## 2019-09-02 DIAGNOSIS — R079 Chest pain, unspecified: Secondary | ICD-10-CM | POA: Insufficient documentation

## 2019-09-02 DIAGNOSIS — I2690 Septic pulmonary embolism without acute cor pulmonale: Secondary | ICD-10-CM

## 2019-09-02 DIAGNOSIS — S2220XD Unspecified fracture of sternum, subsequent encounter for fracture with routine healing: Secondary | ICD-10-CM

## 2019-09-02 DIAGNOSIS — F1190 Opioid use, unspecified, uncomplicated: Secondary | ICD-10-CM | POA: Insufficient documentation

## 2019-09-02 DIAGNOSIS — Z86711 Personal history of pulmonary embolism: Secondary | ICD-10-CM

## 2019-09-02 DIAGNOSIS — R000 Tachycardia, unspecified: Secondary | ICD-10-CM | POA: Insufficient documentation

## 2019-09-02 DIAGNOSIS — X58XXXD Exposure to other specified factors, subsequent encounter: Secondary | ICD-10-CM

## 2019-09-02 DIAGNOSIS — Z23 Encounter for immunization: Secondary | ICD-10-CM

## 2019-09-02 DIAGNOSIS — Z8619 Personal history of other infectious and parasitic diseases: Secondary | ICD-10-CM

## 2019-09-02 DIAGNOSIS — R7881 Bacteremia: Secondary | ICD-10-CM | POA: Insufficient documentation

## 2019-09-02 DIAGNOSIS — Z8614 Personal history of Methicillin resistant Staphylococcus aureus infection: Secondary | ICD-10-CM

## 2019-09-02 DIAGNOSIS — F1199 Opioid use, unspecified with unspecified opioid-induced disorder: Secondary | ICD-10-CM | POA: Insufficient documentation

## 2019-09-02 DIAGNOSIS — F3130 Bipolar disorder, current episode depressed, mild or moderate severity, unspecified: Secondary | ICD-10-CM

## 2019-09-02 DIAGNOSIS — F1120 Opioid dependence, uncomplicated: Secondary | ICD-10-CM

## 2019-09-02 NOTE — Assessment & Plan Note (Signed)
Patient was treated with 6 week course of IV vancomycin. Last blood culture 8/10 with no growth for 5 days.
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
## 2019-09-02 NOTE — Assessment & Plan Note (Addendum)
Patient states that she has been having pain in her sternal area that radiates to the back. Patient describes the pain as 8/10 intensity, stabbing in nature, intermittent that worsens with changes in position or with deep breaths. The pain is worse when she laughs, sneezes, or takes a deep breath. She feels that the pain is similar to the pain she had while in the hospital, but is now more localized in nature. She does not want any pain medication. Was seen in [facility] ED on 08/10/19 during which time she had a CTA that was negative for PE and showed poor healing of comminuted displaced sternal fracture with changes suggestive of possible osteomyelitis.   Assessment and plan  Patient with sternal fracture post cpr back in April 2020. Possible that she has inflammatory changes that are persistant since that time. She has not had any new trauma. Encouraged the patient to use nsaids for pain control as she has hx of oud.   -ESR, CRP, BMP, CBC ordered -Based on lab results may consider ordering MRI of sternum to evaluate for possible osteomyelitis  -Arranged appointment for patient with [facility] on 09/09/19

## 2019-09-02 NOTE — Assessment & Plan Note (Signed)
Patient was discharged from hospital on suboxone 4mg  bid and scheduled to follow up with oud clinic. Patient did not want to remain on suboxone so she went to [facility] for detox. She was told that they will not admit her for suboxone detox and so she snorted heroin to get into hospital. She was admitted at [facility] from 8/17-8/22 and was discharged to stay in [place]. Patient states that she has not used any substance since she was discharged.

## 2019-09-02 NOTE — Patient Instructions (Signed)
It was a pleasure to see you today Ms. Dolce. Please make the following changes:  -Check your blood work today for evaluation of possible infection I will call you with results -We will schedule you with an appointment with infectious disease  -follow up in 4 weeks  If you have any questions or concerns, please call our clinic at [phone] between 9am-5pm and after hours call [phone] and ask for the internal medicine resident on call. If you feel you are having a medical emergency please call 911.   Thank you, we look forward to help you remain healthy!  Lars Mage, MD Internal Medicine PGY3

## 2019-09-03 LAB — BMP8+ANION GAP
Anion Gap: 15 mmol/L (ref 10.0–18.0)
BUN/Creatinine Ratio: 17 (ref 9–23)
BUN: 14 mg/dL (ref 6–20)
CO2: 25 mmol/L (ref 20–29)
Calcium: 9.9 mg/dL (ref 8.7–10.2)
Chloride: 101 mmol/L (ref 96–106)
Creatinine, Ser: 0.83 mg/dL (ref 0.57–1.00)
GFR calc Af Amer: 105 mL/min/{1.73_m2} (ref >59)
GFR calc non Af Amer: 91 mL/min/{1.73_m2} (ref >59)
Glucose: 86 mg/dL (ref 65–99)
Potassium: 4.5 mmol/L (ref 3.5–5.2)
Sodium: 141 mmol/L (ref 134–144)

## 2019-09-03 LAB — CBC WITH DIFFERENTIAL/PLATELET
Basophils Absolute: 0 10*3/uL (ref 0.0–0.2)
Basos: 1 %
EOS (ABSOLUTE): 0.1 10*3/uL (ref 0.0–0.4)
Eos: 1 %
Hematocrit: 33.5 % — ABNORMAL LOW (ref 34.0–46.6)
Hemoglobin: 10.4 g/dL — ABNORMAL LOW (ref 11.1–15.9)
Immature Grans (Abs): 0 10*3/uL (ref 0.0–0.1)
Immature Granulocytes: 0 %
Lymphocytes Absolute: 2.2 10*3/uL (ref 0.7–3.1)
Lymphs: 40 %
MCH: 26.9 pg (ref 26.6–33.0)
MCHC: 31 g/dL — ABNORMAL LOW (ref 31.5–35.7)
MCV: 87 fL (ref 79–97)
Monocytes Absolute: 0.4 10*3/uL (ref 0.1–0.9)
Monocytes: 7 %
Neutrophils Absolute: 2.8 10*3/uL (ref 1.4–7.0)
Neutrophils: 51 %
Platelets: 302 10*3/uL (ref 150–450)
RBC: 3.87 x10E6/uL (ref 3.77–5.28)
RDW: 14.3 % (ref 11.7–15.4)
WBC: 5.6 10*3/uL (ref 3.4–10.8)

## 2019-09-03 LAB — C-REACTIVE PROTEIN: CRP: 3 mg/L (ref 0–10)

## 2019-09-03 LAB — SEDIMENTATION RATE: Sed Rate: 36 mm/hr — ABNORMAL HIGH (ref 0–32)

## 2019-09-07 ENCOUNTER — Emergency Department (HOSPITAL_COMMUNITY)
Admission: EM | Admit: 2019-09-07 | Discharge: 2019-09-07 | Disposition: A | Discharge status: Home / Self Care | Payer: Self-pay | Attending: Emergency Medicine | Admitting: Emergency Medicine

## 2019-09-07 ENCOUNTER — Other Ambulatory Visit: Payer: Self-pay

## 2019-09-07 ENCOUNTER — Encounter (HOSPITAL_COMMUNITY): Payer: Self-pay | Admitting: Emergency Medicine

## 2019-09-07 ENCOUNTER — Emergency Department (HOSPITAL_COMMUNITY): Payer: Self-pay

## 2019-09-07 DIAGNOSIS — T50901A Poisoning by unspecified drugs, medicaments and biological substances, accidental (unintentional), initial encounter: Secondary | ICD-10-CM | POA: Insufficient documentation

## 2019-09-07 DIAGNOSIS — Z85850 Personal history of malignant neoplasm of thyroid: Secondary | ICD-10-CM | POA: Insufficient documentation

## 2019-09-07 DIAGNOSIS — W01198A Fall on same level from slipping, tripping and stumbling with subsequent striking against other object, initial encounter: Secondary | ICD-10-CM | POA: Insufficient documentation

## 2019-09-07 DIAGNOSIS — S022XXB Fracture of nasal bones, initial encounter for open fracture: Secondary | ICD-10-CM | POA: Insufficient documentation

## 2019-09-07 DIAGNOSIS — F17210 Nicotine dependence, cigarettes, uncomplicated: Secondary | ICD-10-CM | POA: Insufficient documentation

## 2019-09-07 DIAGNOSIS — Z79899 Other long term (current) drug therapy: Secondary | ICD-10-CM | POA: Insufficient documentation

## 2019-09-07 DIAGNOSIS — Y9301 Activity, walking, marching and hiking: Secondary | ICD-10-CM | POA: Insufficient documentation

## 2019-09-07 DIAGNOSIS — Y929 Unspecified place or not applicable: Secondary | ICD-10-CM | POA: Insufficient documentation

## 2019-09-07 DIAGNOSIS — Y999 Unspecified external cause status: Secondary | ICD-10-CM | POA: Insufficient documentation

## 2019-09-07 IMAGING — CT CT MAXILLOFACIAL W/O CM
3 series · 15 of 47 positions shown, 18 images · non-contrast
Comparison: Head CT dated [DATE]

CLINICAL DATA: 37-year-old female with head trauma.

EXAM:
CT HEAD WITHOUT CONTRAST
CT MAXILLOFACIAL WITHOUT CONTRAST
TECHNIQUE: Multidetector CT imaging of the head and maxillofacial structures
were performed using the standard protocol without intravenous
contrast. Multiplanar CT image reconstructions of the maxillofacial
structures were also generated.

[Series 3: max soft · axial · 0.33mm/px · z∈[-232,-106]mm · 9 of 75 slices shown, 12 images]
[im 6/75  brain]
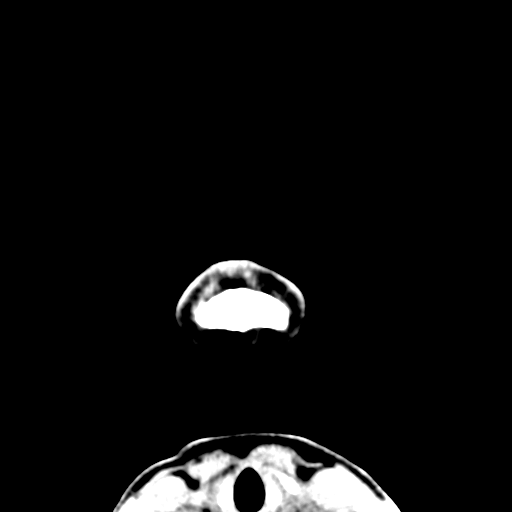
[im 6/75  bone]
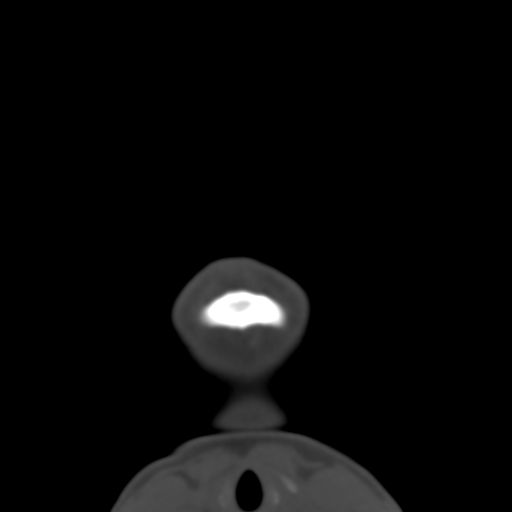
[im 13/75  bone]
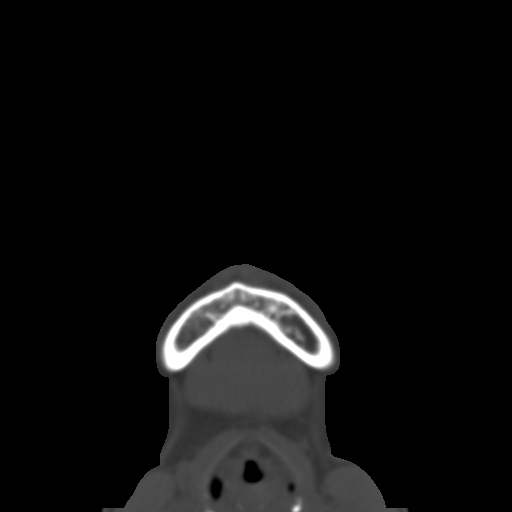
[im 21/75  bone]
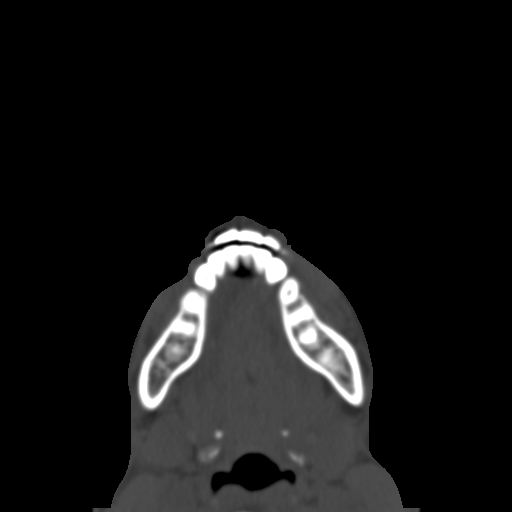
[im 29/75  bone]
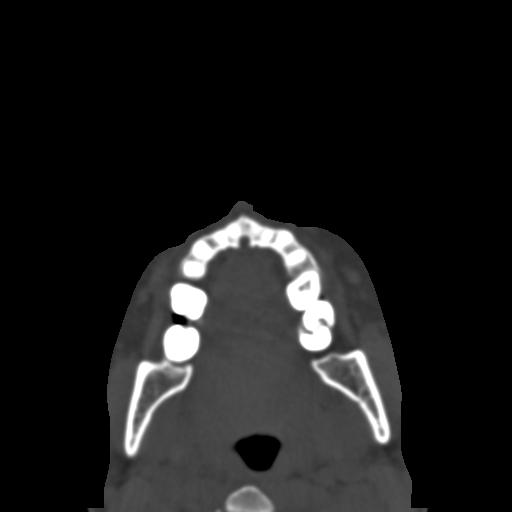
[im 39/75  brain]
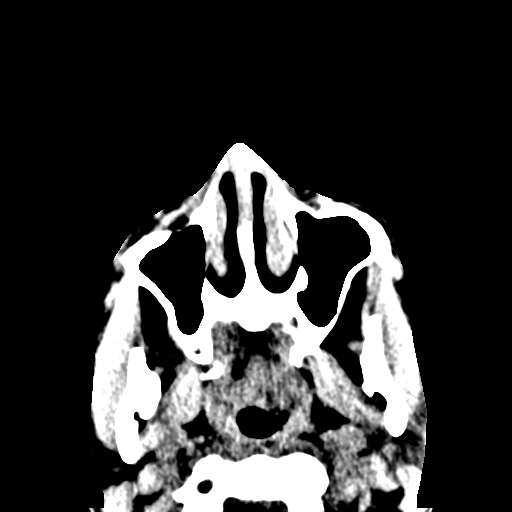
[im 39/75  bone]
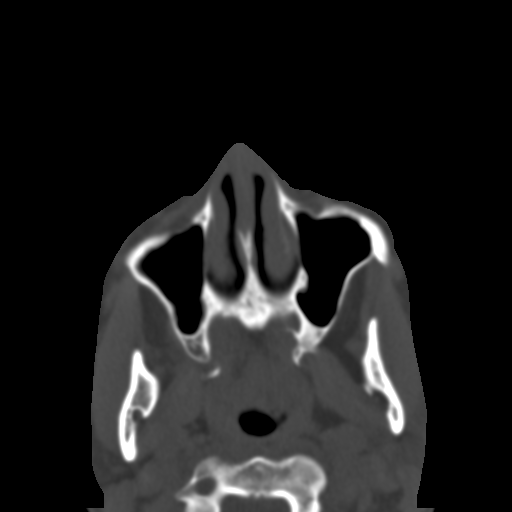
[im 46/75  bone]
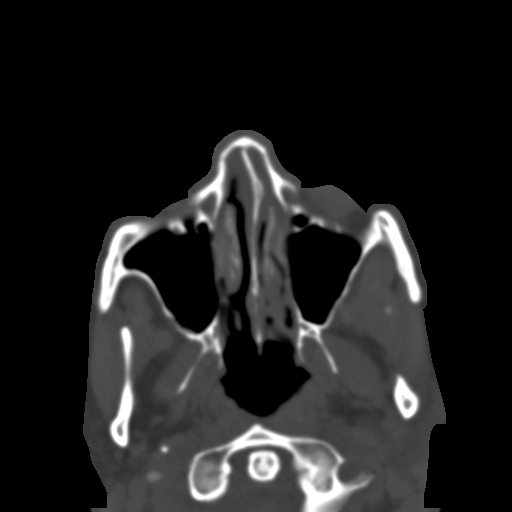
[im 54/75  bone]
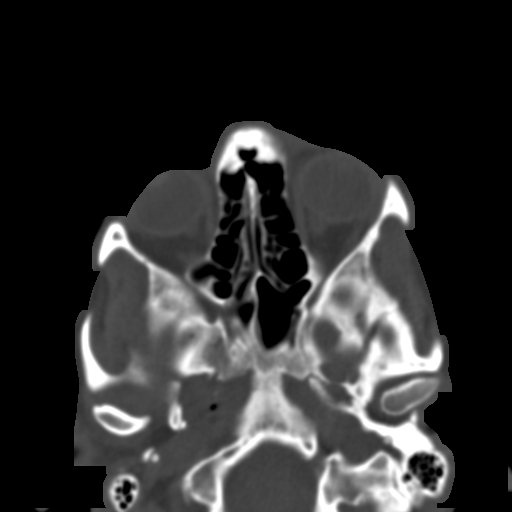
[im 62/75  bone]
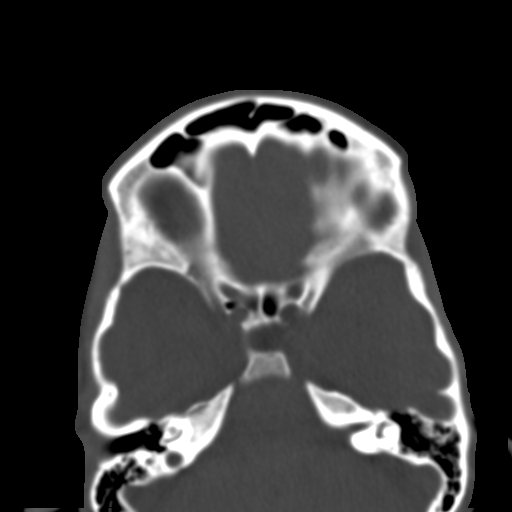
[im 69/75  brain]
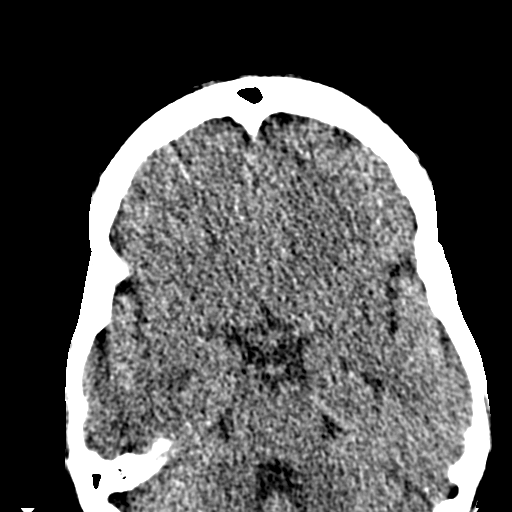
[im 69/75  bone]
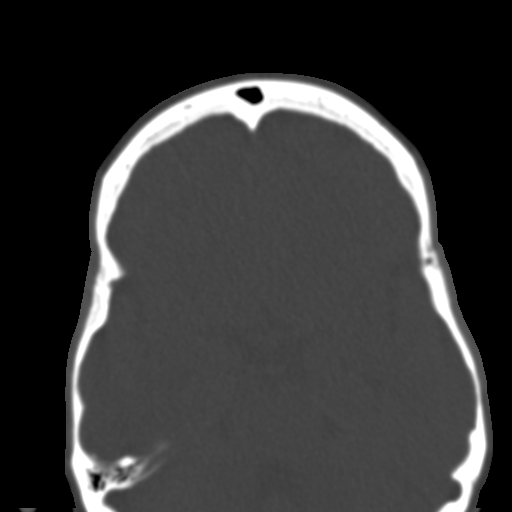

[Series 7: coronal soft · coronal · 0.30mm/px · 3 of 70 slices shown]
[im 24/70  bone]
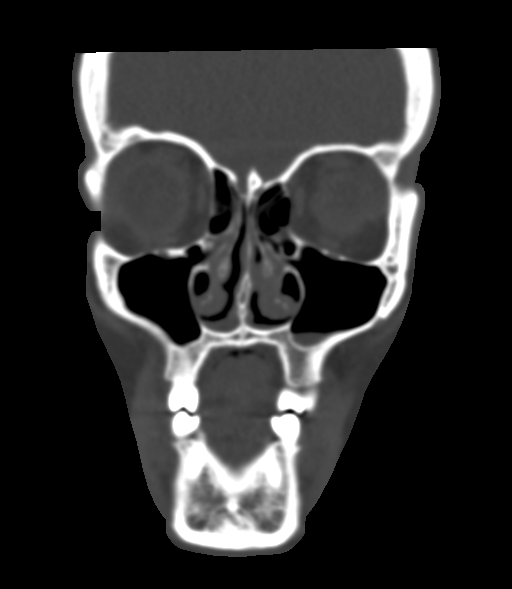
[im 31/70  bone]
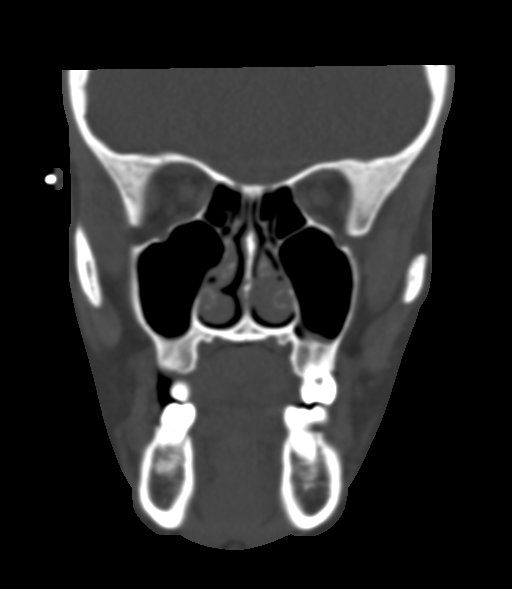
[im 39/70  bone]
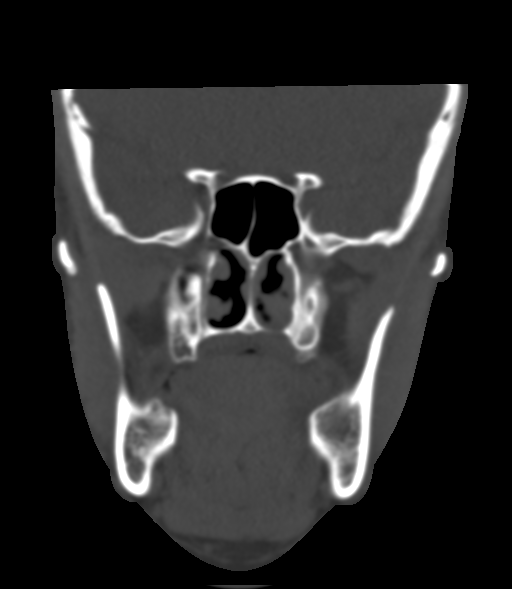

[Series 8: sagittal soft · sagittal · 0.30mm/px · 3 of 77 slices shown]
[im 26/77  bone]
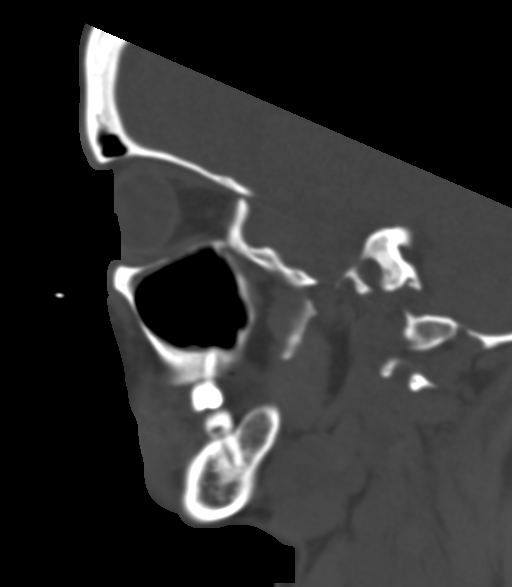
[im 39/77  bone]
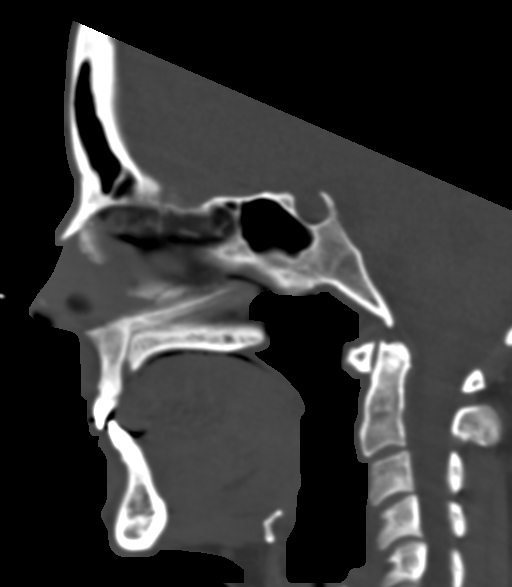
[im 51/77  bone]
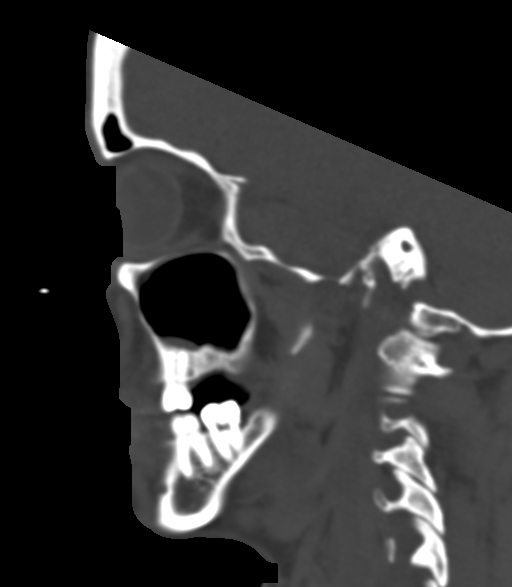

[15 of 47 positions shown; findings below may reference images not displayed]

FINDINGS: CT HEAD FINDINGS

Brain: Mild cortical atrophy, advanced for patient's age. The
gray-white matter discrimination is preserved. There is no acute
intracranial hemorrhage. No mass effect or midline shift. No
extra-axial fluid collection.

Vascular: No hyperdense vessel or unexpected calcification.

Skull: Normal. Negative for fracture or focal lesion.

Other: Mild mucoperiosteal thickening of paranasal sinuses. There is
dysconjugate gaze.

CT MAXILLOFACIAL FINDINGS

Osseous: Minimal step-off of the anterior nasal septum (series 4,
image 29) consistent with a fracture. Clinical correlation is
recommended. No other acute fracture. No mandibular subluxation.

Orbits: There is dysconjugate gaze. Clinical correlation is
recommended. The globes and retro-orbital fat are preserved.

Sinuses: Mild mucoperiosteal thickening paranasal sinuses. No
air-fluid level. Mastoid air cells are clear.

Soft tissues: Mild soft tissue thickening/swelling the nose. No
hematoma
IMPRESSION: 1. No acute intracranial hemorrhage.
2. Minimally displaced fracture of the anterior nasal septum.

## 2019-09-07 IMAGING — CT CT HEAD W/O CM
3 series · 15 of 47 positions shown, 18 images · non-contrast
Comparison: Head CT dated [DATE]

CLINICAL DATA: 37-year-old female with head trauma.

EXAM:
CT HEAD WITHOUT CONTRAST
CT MAXILLOFACIAL WITHOUT CONTRAST
TECHNIQUE: Multidetector CT imaging of the head and maxillofacial structures
were performed using the standard protocol without intravenous
contrast. Multiplanar CT image reconstructions of the maxillofacial
structures were also generated.

[Series 3: head wo · axial · 0.42mm/px · z∈[-134,-10]mm · 9 of 30 slices shown, 12 images]
[im 3/30  brain]
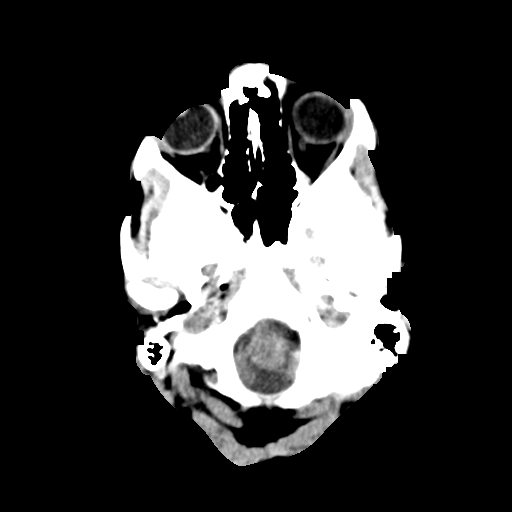
[im 3/30  bone]
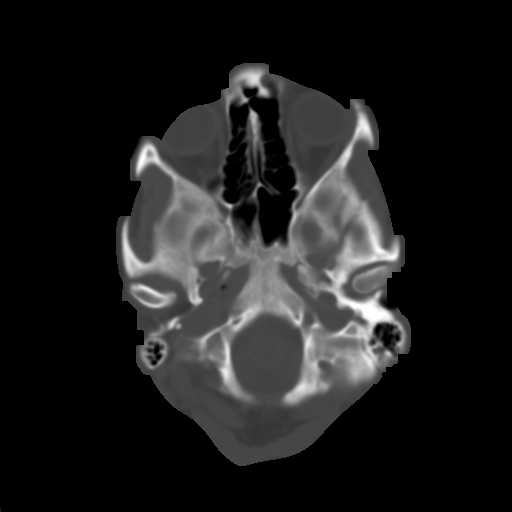
[im 6/30  brain]
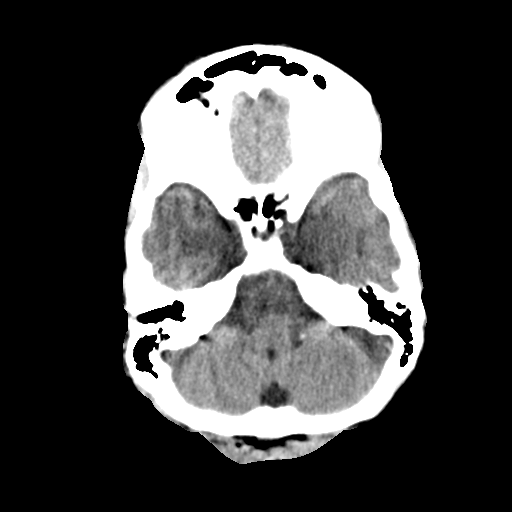
[im 9/30  brain]
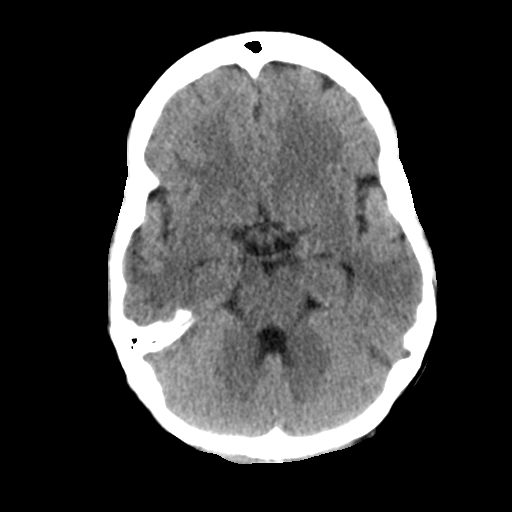
[im 12/30  brain]
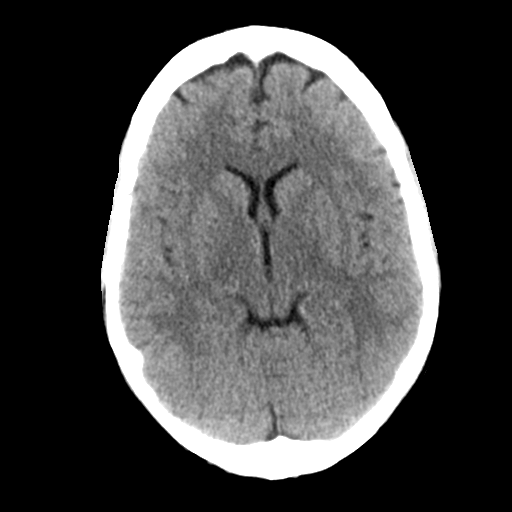
[im 16/30  brain]
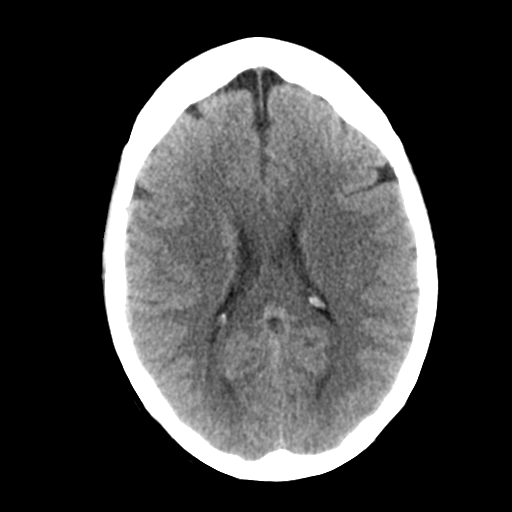
[im 16/30  bone]
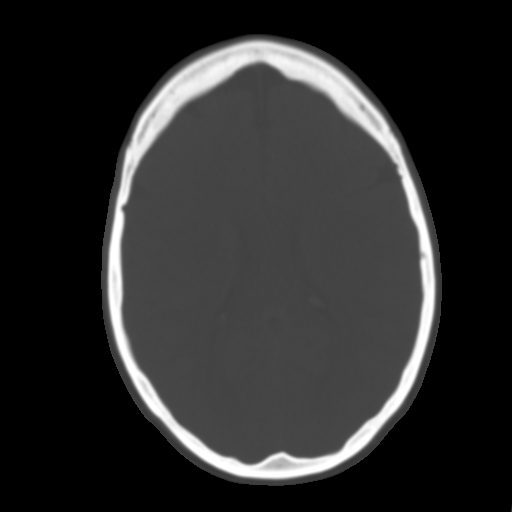
[im 19/30  brain]
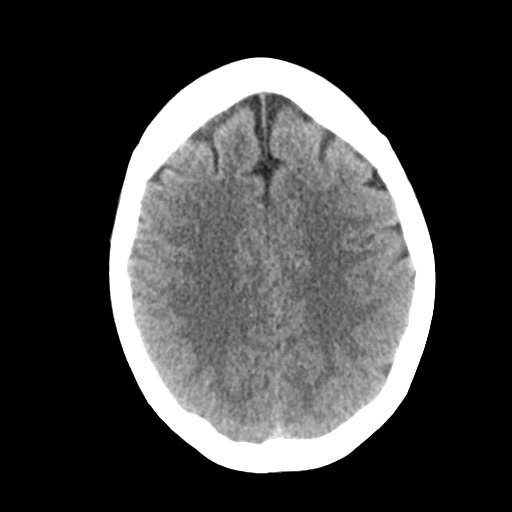
[im 22/30  brain]
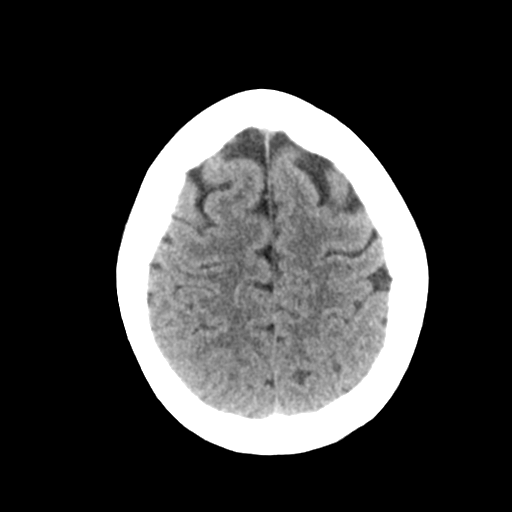
[im 25/30  brain]
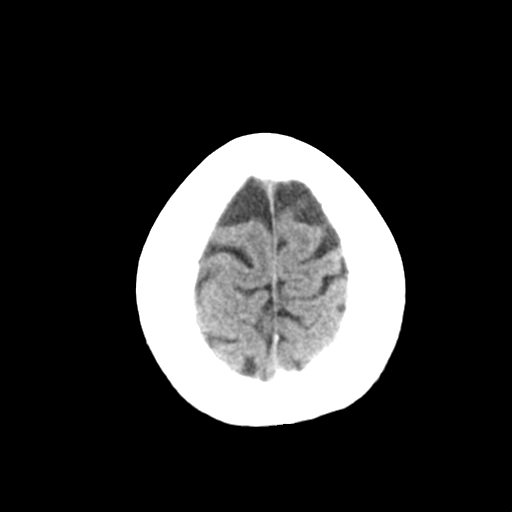
[im 28/30  brain]
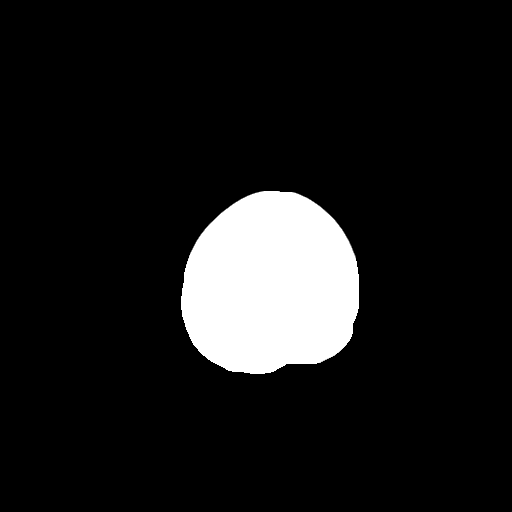
[im 28/30  bone]
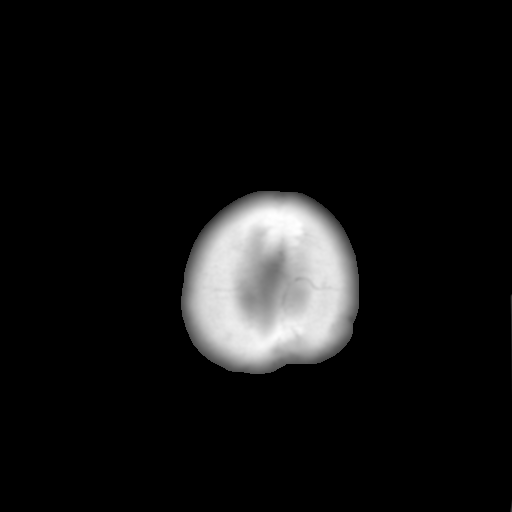

[Series 6: coronal soft tissue · coronal · 0.29mm/px · 3 of 66 slices shown]
[im 22/66  brain]
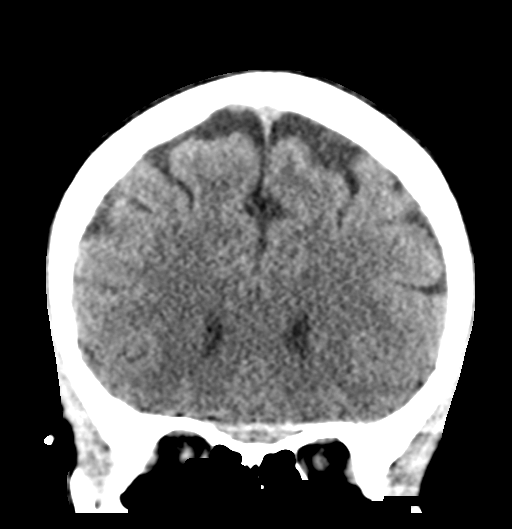
[im 29/66  brain]
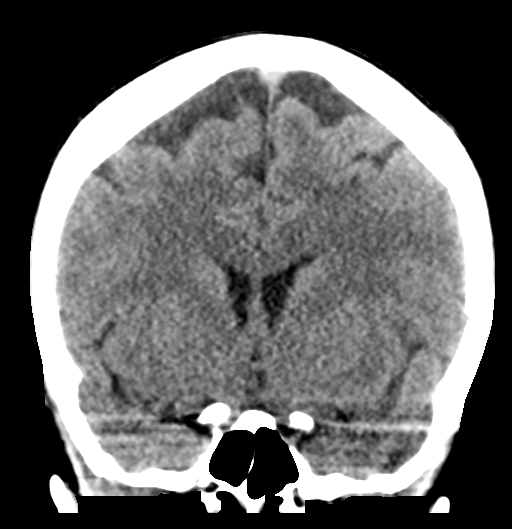
[im 37/66  brain]
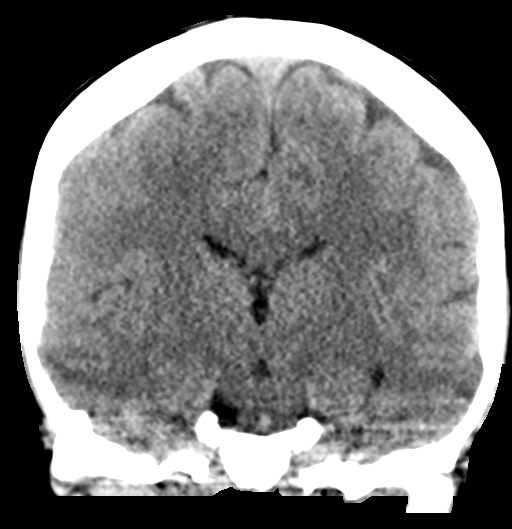

[Series 7: sagittal soft tissue · sagittal · 0.29mm/px · 3 of 50 slices shown]
[im 17/50  brain]
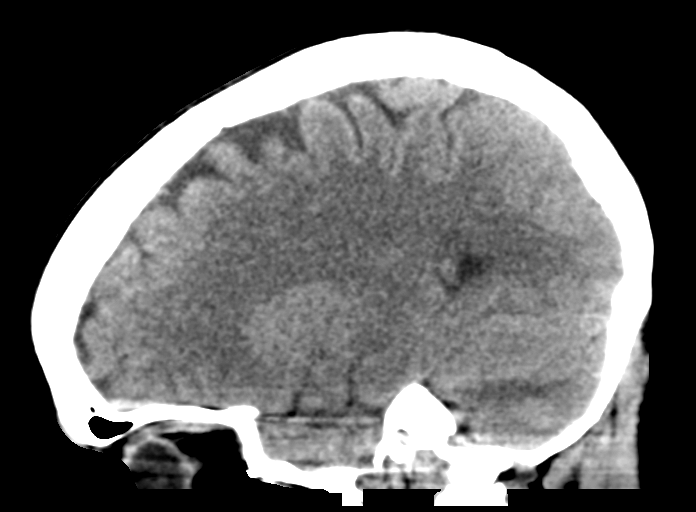
[im 25/50  brain]
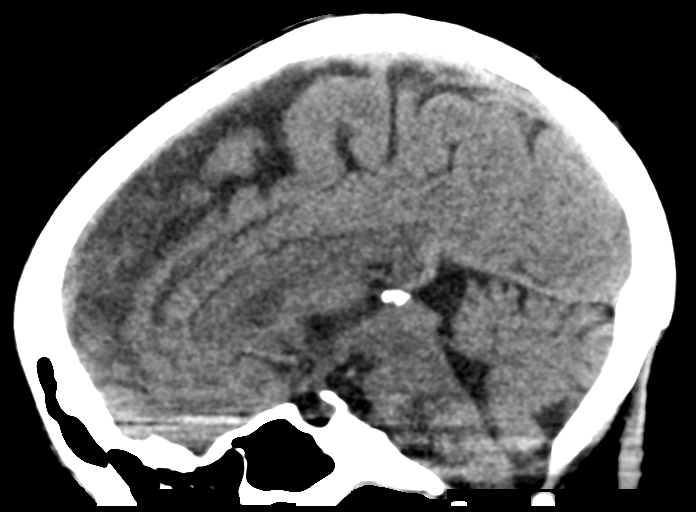
[im 33/50  brain]
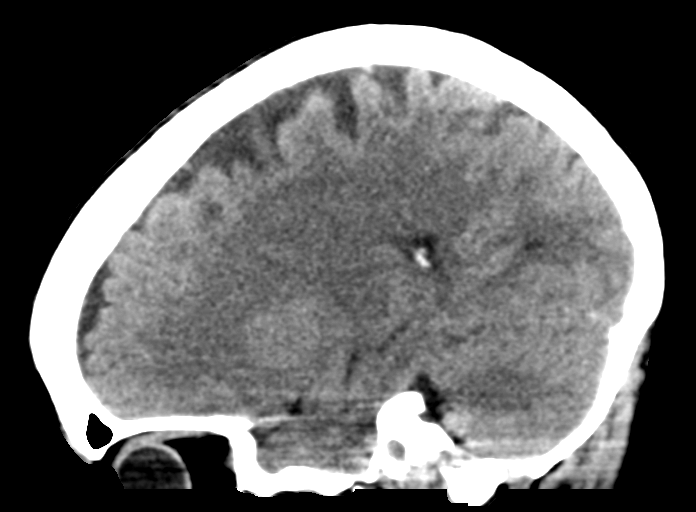

[15 of 47 positions shown; findings below may reference images not displayed]

FINDINGS: CT HEAD FINDINGS

Brain: Mild cortical atrophy, advanced for patient's age. The
gray-white matter discrimination is preserved. There is no acute
intracranial hemorrhage. No mass effect or midline shift. No
extra-axial fluid collection.

Vascular: No hyperdense vessel or unexpected calcification.

Skull: Normal. Negative for fracture or focal lesion.

Other: Mild mucoperiosteal thickening of paranasal sinuses. There is
dysconjugate gaze.

CT MAXILLOFACIAL FINDINGS

Osseous: Minimal step-off of the anterior nasal septum (series 4,
image 29) consistent with a fracture. Clinical correlation is
recommended. No other acute fracture. No mandibular subluxation.

Orbits: There is dysconjugate gaze. Clinical correlation is
recommended. The globes and retro-orbital fat are preserved.

Sinuses: Mild mucoperiosteal thickening paranasal sinuses. No
air-fluid level. Mastoid air cells are clear.

Soft tissues: Mild soft tissue thickening/swelling the nose. No
hematoma
IMPRESSION: 1. No acute intracranial hemorrhage.
2. Minimally displaced fracture of the anterior nasal septum.

## 2019-09-07 MED ORDER — AMOXICILLIN-POT CLAVULANATE 875-125 MG PO TABS: 1.0000 | ORAL_TABLET | Freq: Two times a day (BID) | ORAL | 0 refills | Status: DC

## 2019-09-07 MED ORDER — HYDROCODONE-ACETAMINOPHEN 5-325 MG PO TABS
1.0000 | ORAL_TABLET | Freq: Once | ORAL | Status: AC
  Administered 2019-09-07: 1 via ORAL
  Filled 2019-09-07: qty 1

## 2019-09-07 MED ORDER — NALOXONE HCL 4 MG/0.1ML NA LIQD
1.0000 | Freq: Once | NASAL | Status: AC
  Administered 2019-09-07: 22:00:00 1 via NASAL
  Filled 2019-09-07: qty 4

## 2019-09-07 MED ORDER — NALOXONE HCL 2 MG/2ML IJ SOSY
PREFILLED_SYRINGE | INTRAMUSCULAR | Status: AC
  Administered 2019-09-07: 20:00:00
  Filled 2019-09-07: qty 2

## 2019-09-07 NOTE — ED Notes (Signed)
PA went in room to advise pt about d/c.  Pt found unresponsive with with low RR and cyanosis. Pt was assisted in respirations.  Pt was given 2mg  IV narcan at which time pt became responsive.   Pt continues to be on the monitor, continue to observe closely.  Staff checked her belongings, no further drugs found.  Warm blankets given for comfort.

## 2019-09-07 NOTE — ED Notes (Addendum)
2 mg of Narcan given IV per Amie [place], PA verbal order to reverse respiratory arrest d/t suspected opioids. Narcan reversed symptoms.

## 2019-09-07 NOTE — Discharge Instructions (Addendum)
It was my pleasure taking care of you today!   Quit doing drugs.   Please take all of your antibiotics until finished! This is to prevent infection.   Call the ENT doctor listed in the morning to schedule a follow up appointment.   Return to ER for new or worsening symptoms, any additional concerns.

## 2019-09-07 NOTE — ED Triage Notes (Signed)
EMS states pt had a fall while walking. Pt has sternal pain from a previous code. Pt has an abrasion on her nose from her fall. Pt denies suicidal ideation, but her house mates think she may have suicidal thoughts.   BP 108/72 HR 130 RR 14 Temp 98.6 SpO2 100 on 2L
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
## 2019-09-07 NOTE — ED Notes (Signed)
Pt states that she can not go back to the home she came from and doesn't know if she has anywhere to go at this time.  Pt states that she would like to go to detox, PA notified

## 2019-09-08 NOTE — Progress Notes (Signed)
Internal Medicine Clinic Attending  Case discussed with Dr. Chundi at the time of the visit.  We reviewed the resident's history and exam and pertinent patient test results.  I agree with the assessment, diagnosis, and plan of care documented in the resident's note.  Alexander Raines, M.D., Ph.D.  

## 2019-09-09 ENCOUNTER — Telehealth: Payer: Self-pay

## 2019-09-09 ENCOUNTER — Ambulatory Visit: Payer: Self-pay | Admitting: Internal Medicine

## 2019-09-09 NOTE — Telephone Encounter (Signed)
Received call from [name] stating patient is currently in the hospital and was unable to make it to appointment. Per [name] patient does not need to follow up with [facility]. [facility] made aware and will refer patient to ENT. Monica Stephens

## 2019-09-09 NOTE — Addendum Note (Signed)
Addended by: Hulan Fray on: 09/09/2019 05:24 PM   Modules accepted: Orders

## 2019-09-21 ENCOUNTER — Encounter: Payer: Self-pay | Admitting: Internal Medicine

## 2019-09-21 ENCOUNTER — Other Ambulatory Visit: Payer: Self-pay

## 2019-09-21 ENCOUNTER — Ambulatory Visit (INDEPENDENT_AMBULATORY_CARE_PROVIDER_SITE_OTHER): Payer: Self-pay | Admitting: Internal Medicine

## 2019-09-21 ENCOUNTER — Ambulatory Visit (HOSPITAL_COMMUNITY)
Admission: RE | Admit: 2019-09-21 | Discharge: 2019-09-21 | Disposition: A | Discharge status: Home / Self Care | Payer: Self-pay | Source: Ambulatory Visit | Attending: Internal Medicine | Admitting: Internal Medicine

## 2019-09-21 VITALS — BP 114/85 | HR 104 | Temp 98.2°F | Ht 62.0 in | Wt 101.5 lb

## 2019-09-21 DIAGNOSIS — Z85850 Personal history of malignant neoplasm of thyroid: Secondary | ICD-10-CM

## 2019-09-21 DIAGNOSIS — F332 Major depressive disorder, recurrent severe without psychotic features: Secondary | ICD-10-CM

## 2019-09-21 DIAGNOSIS — R0789 Other chest pain: Secondary | ICD-10-CM

## 2019-09-21 DIAGNOSIS — F411 Generalized anxiety disorder: Secondary | ICD-10-CM

## 2019-09-21 DIAGNOSIS — Z79890 Hormone replacement therapy (postmenopausal): Secondary | ICD-10-CM

## 2019-09-21 DIAGNOSIS — F1190 Opioid use, unspecified, uncomplicated: Secondary | ICD-10-CM

## 2019-09-21 DIAGNOSIS — R079 Chest pain, unspecified: Secondary | ICD-10-CM

## 2019-09-21 DIAGNOSIS — Z923 Personal history of irradiation: Secondary | ICD-10-CM

## 2019-09-21 DIAGNOSIS — S2220XS Unspecified fracture of sternum, sequela: Secondary | ICD-10-CM | POA: Insufficient documentation

## 2019-09-21 DIAGNOSIS — L659 Nonscarring hair loss, unspecified: Secondary | ICD-10-CM

## 2019-09-21 DIAGNOSIS — E890 Postprocedural hypothyroidism: Secondary | ICD-10-CM

## 2019-09-21 DIAGNOSIS — Z79899 Other long term (current) drug therapy: Secondary | ICD-10-CM

## 2019-09-21 IMAGING — DX DG CHEST 2V
2 series · 2 of 2 positions shown · non-contrast
Comparison: Radiographs [DATE] and [DATE]; CT [DATE].

CLINICAL DATA: Persistent chest pain. Sternal fracture sustained
during CPR in [place].

EXAM:
CHEST - 2 VIEW

[w chest pa]
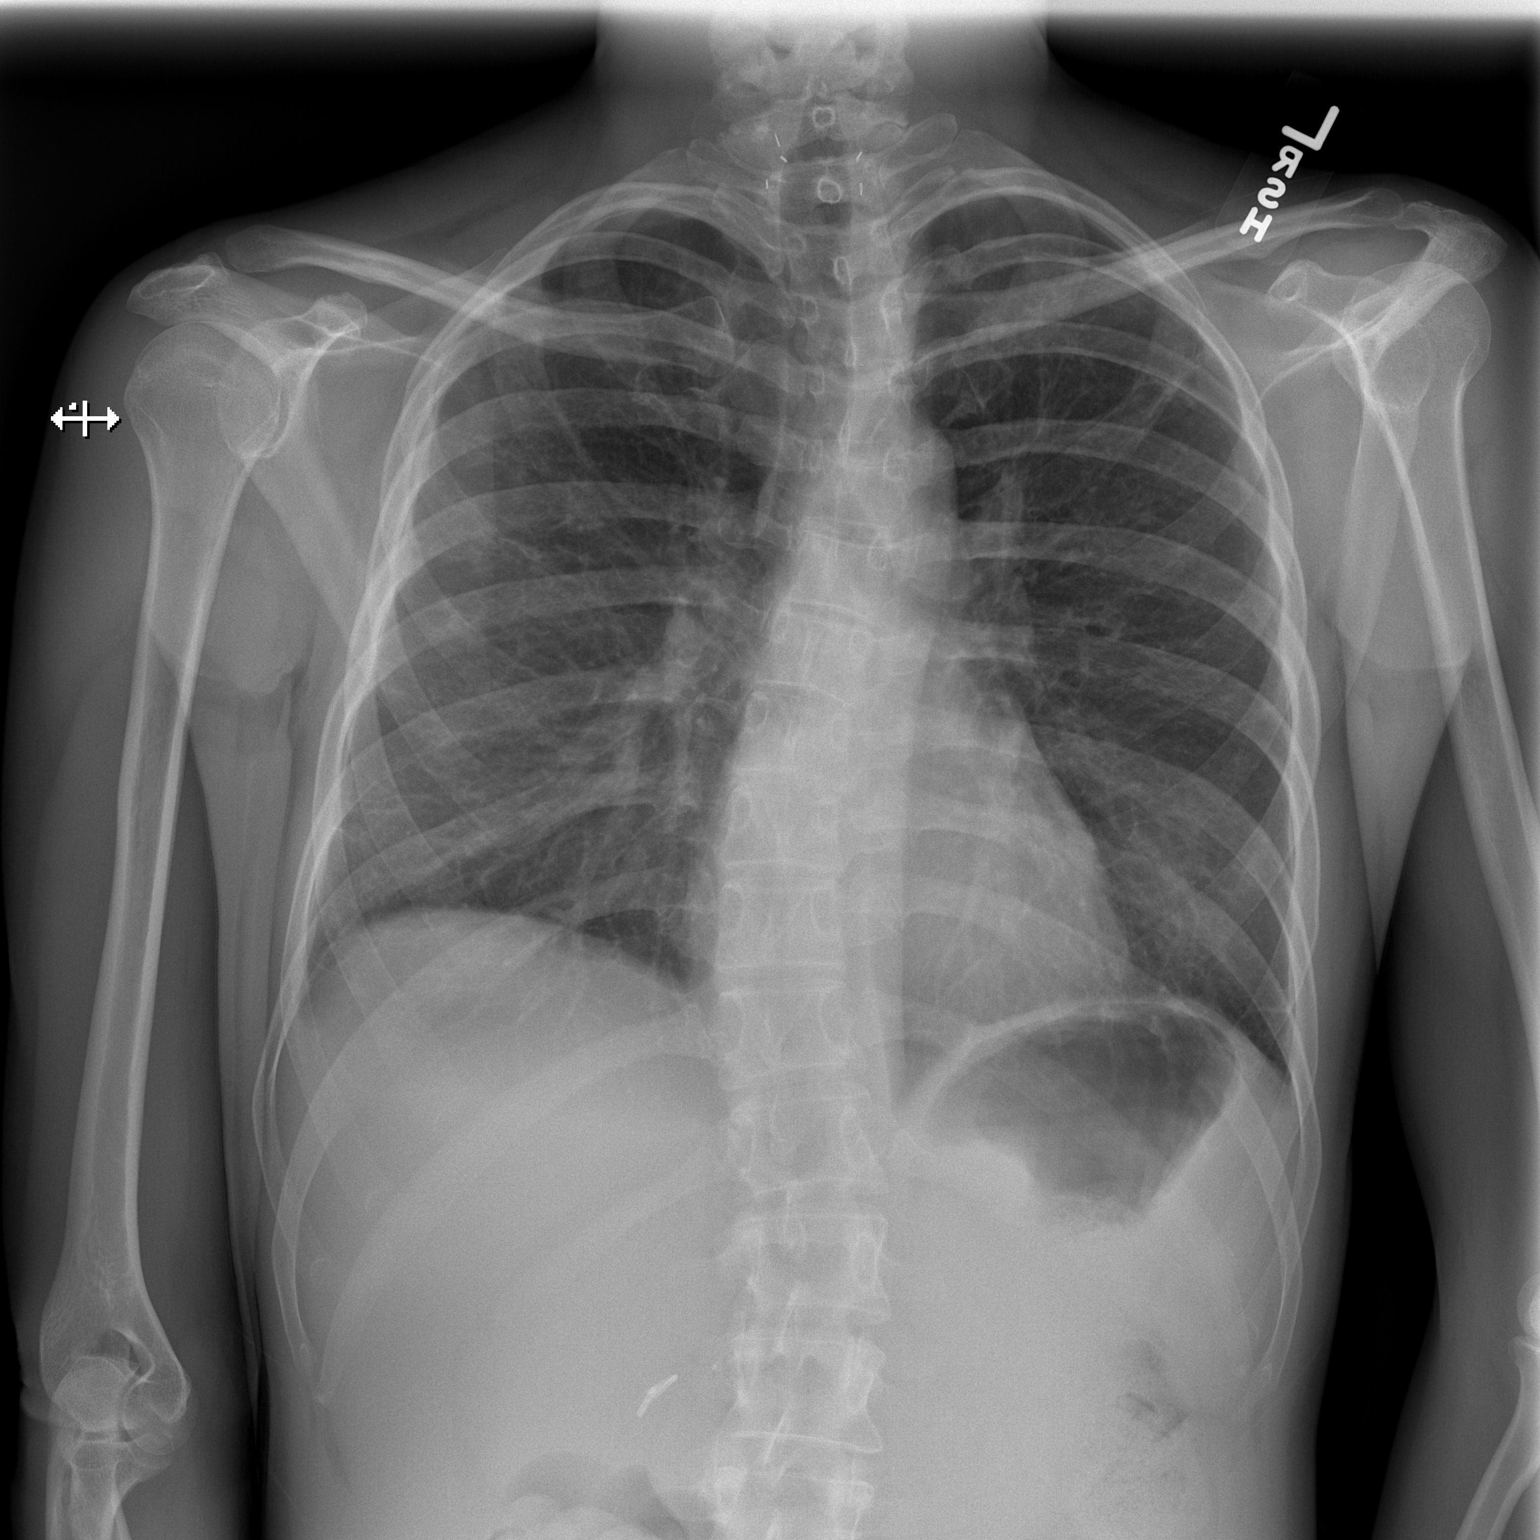

[w chest lat]
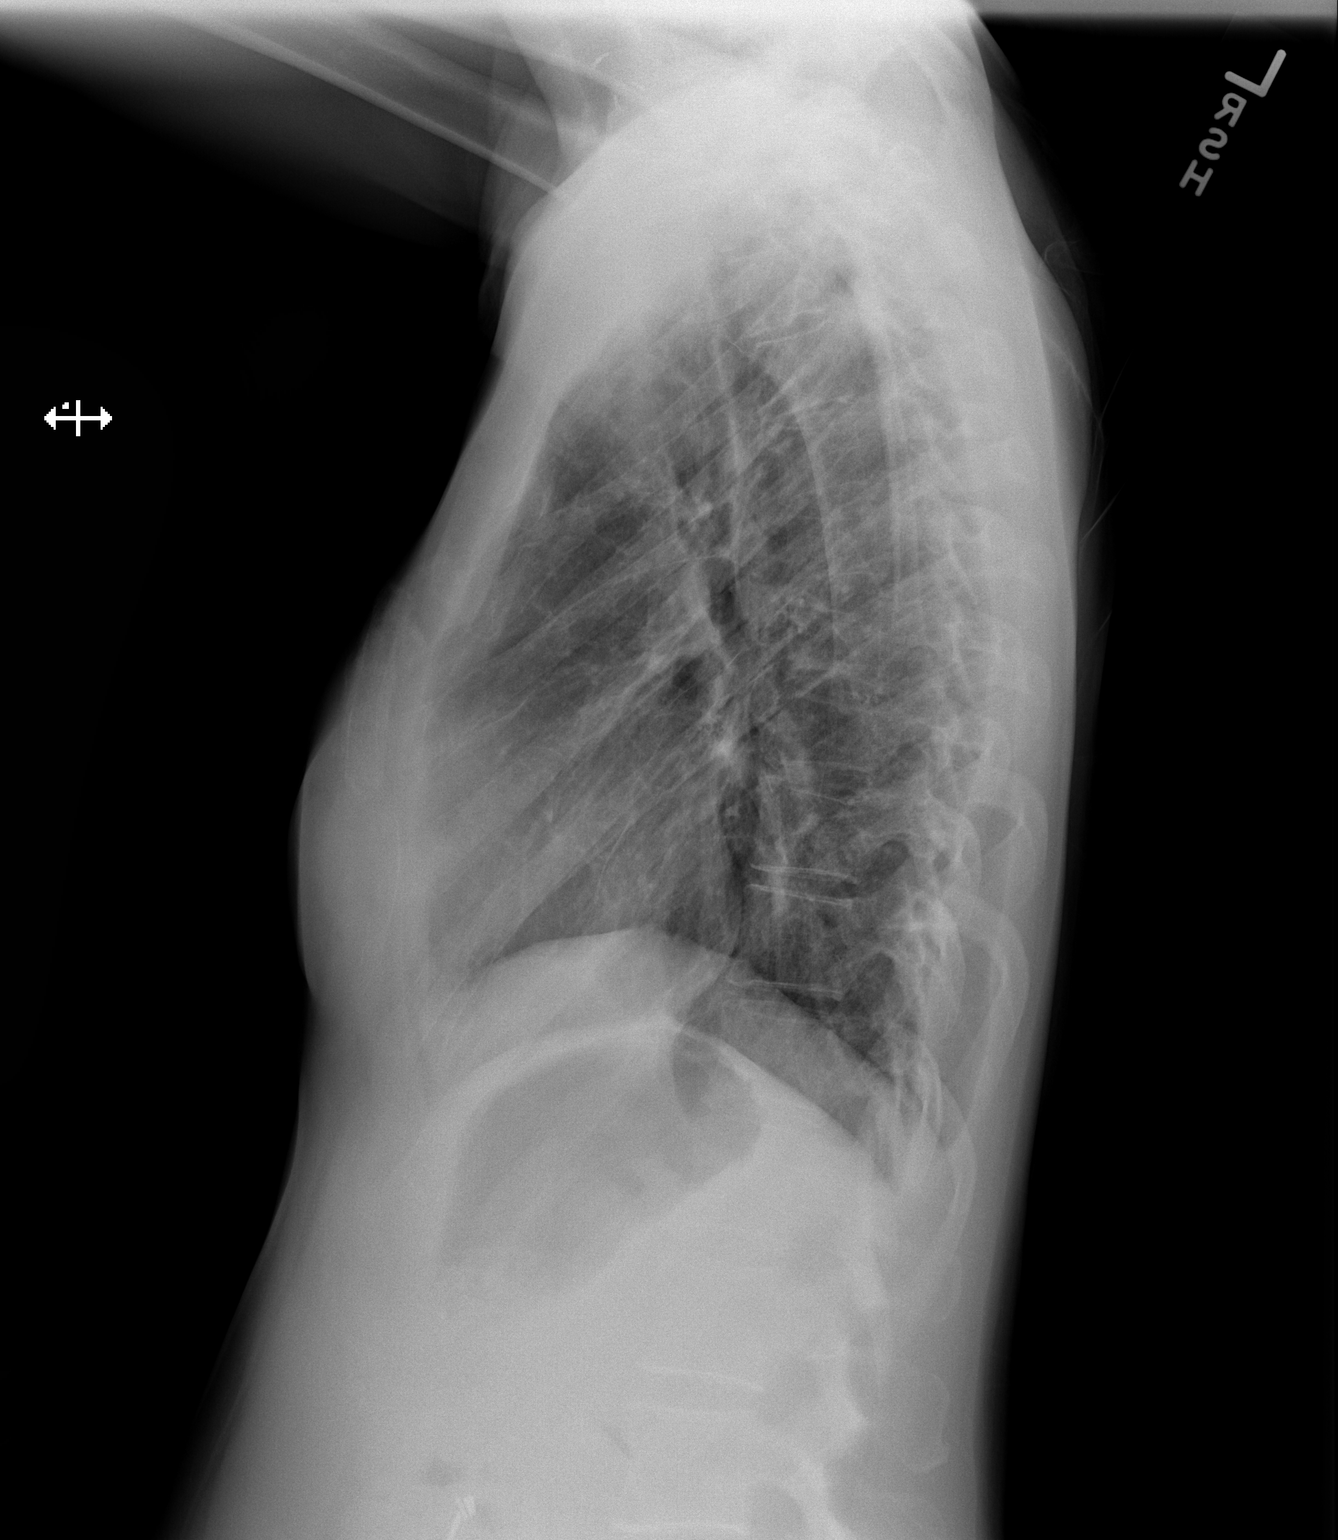

[2 of 2 positions shown; findings below may reference images not displayed]

FINDINGS: The heart size and mediastinal contours are stable. The nodular
airspace opacities demonstrated previously have nearly completely
resolved. The lungs are essentially clear. There is no pleural
effusion or pneumothorax. Surgical clips are present at the thoracic
inlet from previous thyroidectomy.

There is a stable thoracolumbar scoliosis. Chronic anteriorly
displaced fracture of the sternal body appears unchanged. No
significant retrosternal soft tissue swelling or progressive bone
destruction identified.
IMPRESSION: 1. Suspected nonunion of previously demonstrated sternal body
fracture. This fracture remains moderately displaced anteriorly. In
correlation with CT of [DATE], possible associated
osteomyelitis.
2. Near complete resolution of the nodular pulmonary opacities,
likely resolved septic emboli.

## 2019-09-21 MED ORDER — DULOXETINE HCL 60 MG PO CPEP: 90.0000 mg | ORAL_CAPSULE | Freq: Every day | ORAL | 0 refills | Status: DC

## 2019-09-21 MED ORDER — DULOXETINE HCL 30 MG PO CPEP: 90.0000 mg | ORAL_CAPSULE | Freq: Every day | ORAL | Status: DC

## 2019-09-21 MED ORDER — TRAZODONE HCL 150 MG PO TABS: 150.0000 mg | ORAL_TABLET | Freq: Every day | ORAL | 0 refills | Status: AC

## 2019-09-21 MED ORDER — LIDOCAINE 5 % EX PTCH: 1.0000 | MEDICATED_PATCH | CUTANEOUS | 0 refills | Status: AC

## 2019-09-21 NOTE — Patient Instructions (Signed)
It was a pleasure to see you today Ms. Dross. Please make the following changes:  For depression and sleeplessness -Please increase your trazodone dose to 150mg  daily   For chest discomfort/sternal fracture:  -please use lidocaine patch and ibuprofen 600mg  every 8 hrs as needed  -please get chest xray done   For hair loss: We have collected bloodwork and I will call you with results  If you have any questions or concerns, please call our clinic at [phone] between 9am-5pm and after hours call [phone] and ask for the internal medicine resident on call. If you feel you are having a medical emergency please call 911.   Thank you, we look forward to help you remain healthy!  Lars Mage, MD Internal Medicine PGY3
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
## 2019-09-22 DIAGNOSIS — L659 Nonscarring hair loss, unspecified: Secondary | ICD-10-CM | POA: Insufficient documentation

## 2019-09-22 LAB — T4, FREE: Free T4: 1.17 ng/dL (ref 0.82–1.77)

## 2019-09-22 LAB — TSH: TSH: 8.28 u[IU]/mL — ABNORMAL HIGH (ref 0.450–4.500)

## 2019-09-22 NOTE — Progress Notes (Signed)
Internal Medicine Clinic Attending  Case discussed with Dr. Chundi at the time of the visit.  We reviewed the resident's history and exam and pertinent patient test results.  I agree with the assessment, diagnosis, and plan of care documented in the resident's note. 

## 2019-09-22 NOTE — Assessment & Plan Note (Signed)
Patient states that she has noticed that she has been having several month history of hair loss that is especially been worse over the past 1 month. She states that she has noticed clumps of hair coming out.   [name] alopecia is generalized and without any scaling. She has been undergoing lots of stress recently with her being homeless, not having any social support, and difficulties with her families. She expressed that she relapsed once this past month and is now at rehabilitation facility. She is angry about having done this.   Assessment and plan  The patient has several mental stressors that put her at risk of telogen effuvium. She also has a history of follicular cancer for which she has not been followed up in several months which can also be causing her hair loss. Recently her levothyroxine dose was decreased to 139mcg daily, but she is not aware of when this happened. Her past few tsh readings have been significantly elevated.   -repeat tsh and free t4 -will request records from endocrinologist, she will likely need follow up.
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
## 2019-09-22 NOTE — Assessment & Plan Note (Signed)
The patient has a history of severe depression. She underwent 12 ECT treatments at both [facility] and [facility], but said that the therapy did not work. She is currently taking trazodone 150mg  qhs and cymbalta 90mg  daily.   The patient has a phq9 score of 20 and expresses severe depression.   Assessment and plan  Recommended patient seek care at [facility] as she does not have insurance. Will place integrated behavioral health referral.

## 2019-09-23 ENCOUNTER — Telehealth: Payer: Self-pay | Admitting: *Deleted

## 2019-09-23 ENCOUNTER — Telehealth: Payer: Self-pay

## 2019-09-23 DIAGNOSIS — Z85850 Personal history of malignant neoplasm of thyroid: Secondary | ICD-10-CM

## 2019-09-23 DIAGNOSIS — C73 Malignant neoplasm of thyroid gland: Secondary | ICD-10-CM

## 2019-09-23 NOTE — Telephone Encounter (Signed)
[facility] is calling and states pt would like lab results: Monica Bandy lpn will be in tomorrow 0730 til 1200 And 1300 til 1630 Could you please call [phone] ext [phone] Needs clarify orders: need IBU 600mg  every 8 hrs as needed #50 called pharm and VO please add to medlist also clarified w/ pharm the lidocaine patch script, pharmacist stated the script was fine and he would speak to the facility

## 2019-09-23 NOTE — Telephone Encounter (Signed)
error 

## 2019-09-25 ENCOUNTER — Other Ambulatory Visit: Payer: Self-pay | Admitting: Internal Medicine

## 2019-09-25 DIAGNOSIS — C73 Malignant neoplasm of thyroid gland: Secondary | ICD-10-CM

## 2019-09-25 MED ORDER — LEVOTHYROXINE SODIUM 175 MCG PO TABS: 175.0000 ug | ORAL_TABLET | Freq: Every day | ORAL | 1 refills | Status: DC

## 2019-09-25 NOTE — Progress Notes (Signed)
Called [facility] and spoke to [facility] and to Ms. Drost. Per [facility] the patient is only taking levothyroxine 169mcg daily.   -increased thyroid dose as patient is supposed to have targeted low tsh level. Sent new script for levothyroxine 172mcg qd to be taken early morning 1 hr prior to meal.   -thyroglobulin  -thyroid ultrasound  -scheduled repeat tsh in 6 weeks  -ibuprofen increased to 600mg  q6hrs  Lars Mage, MD Internal Medicine PGY3 Pager:[phone] 09/25/2019, 4:07 PM

## 2019-09-25 NOTE — Telephone Encounter (Signed)
Called [facility] and spoke to [facility] who confirmed that patient is only taking levothyroxine 156mcg daily.   -sent new script for levothyroxine 116mcg daily to be taken 1hr prior to meals  -informed phylis that patient is supposed to come in for thyroglobulin test, thyroid ultrasound, and that she has been referred to general surgery  -ibuprofen has been increased to ibuprofen 600mg  q6hrs

## 2019-09-25 NOTE — Telephone Encounter (Signed)
Pt and LPN phyllis call to express concern about not hearing from MD r/t results from labs and chest XR She was concerned about rec'g records from [facility], dr farmer, I have called and LM for someone to call our triage ph and update when we will receive the records. Pt and LPN express that pt is losing her hair in "chunks". Pt states that the 2 previous bouts of thyroid cancer started this way. She is very upset She states she was expecting a call Thursday about labs and xr. Please advise or call her at [phone] ext [phone]

## 2019-09-25 NOTE — Telephone Encounter (Signed)
I called the center yesterday 9/24, but there was no answer. I also sent a note to Dr. Philipp Ovens to discuss plan as I signed out the patient with her. Am waiting for her reply, before I put in orders.

## 2019-09-25 NOTE — Telephone Encounter (Signed)
1) [name] synthroid dosage has been changed and she is taking levothyroxine 154mcg qd. Based on thyroid studies will increase levothyroxine from 143mcg daily to 126mcg. Will also order thyroglobulin and thyroid ultrasound. Patient to follow up in 2-4 weeks  2) Chest xray showed "Suspected nonunion of previously demonstrated sternal body fracture. This fracture remains moderately displaced anteriorly. In correlation with CT of 08/10/2019, possible associated Osteomyelitis."   Since the patient continues to have pain almost 5 months later, I will consult general surgery to see if they can do operative fixation. Based on uptodate article:   "There are no clear guidelines to determine which patients with a sternal fracture warrant imaging with, but it is reasonable to obtain the study if any of the following are present:   -Displaced fracture  -Fracture causing severe pain  -Patient with multiple comorbidities (eg, chronic lung disease)  -Concerning examination findings   However, some sternal fractures may be treated better by subperiosteal anesthesia or operative fixation. Clinicians should obtain consultation with a general or trauma surgeon if complicating factors are present. "

## 2019-09-25 NOTE — Addendum Note (Signed)
Addended by: Lars Mage on: 09/25/2019 06:35 PM   Modules accepted: Orders

## 2019-09-25 NOTE — Telephone Encounter (Signed)
I tried to call Monica Stephens but there was no answer and I got the answering machine.  I reviewed the June 2019 office note from Dr. Mare Ferrari and confirmed she wants the TSH to be low normal.  Dr. Mare Ferrari had her on 150 mcg of Synthroid taking 7 pills on one single day a week.  She thought due to the patient's small size that this might even be a high dose.  She also checked a thyroglobulin and a thyroid ultrasound to monitor for the patient's thyroid cancer.  Currently, the patient is taking 150 mcg every day for 6 days and 225 mcg on 1 day a week.  This is a higher dose than what her endocrinologist had her on.  I want to first confirm that she has been taking her medicine on a daily basis for at least 6 weeks.  If she has not been taking it every day for at least 6 weeks, the high TSH likely reflects noncompliance and in that situation, she should continue her current dose.  If she has been taking it every day for the past 6 weeks, we will need to increase the dose and I have asked Bonnita Nasuti to call me back this afternoon so that we can discuss the dose.  I will place an order for thyroglobulin and a thyroid ultrasound.  [name] showed resolution of the pulmonary septic emboli but continued demonstration of the sternal fracture.

## 2019-09-28 ENCOUNTER — Telehealth: Payer: Self-pay | Admitting: *Deleted

## 2019-09-28 ENCOUNTER — Other Ambulatory Visit (INDEPENDENT_AMBULATORY_CARE_PROVIDER_SITE_OTHER): Payer: Self-pay

## 2019-09-28 ENCOUNTER — Other Ambulatory Visit: Payer: Self-pay

## 2019-09-28 DIAGNOSIS — Z85850 Personal history of malignant neoplasm of thyroid: Secondary | ICD-10-CM

## 2019-09-28 MED ORDER — LEVOTHYROXINE SODIUM 150 MCG PO TABS: 150.0000 ug | ORAL_TABLET | Freq: Every day | ORAL | 1 refills | Status: AC

## 2019-09-28 NOTE — Telephone Encounter (Signed)
[facility] sent to [facility] which was her pharmacy on file.  Would you please let [facility] know to cancel the 175 Synthroid prescription?

## 2019-09-28 NOTE — Telephone Encounter (Signed)
Bonnita Nasuti and I worked with Silva Bandy on Friday be 25th.  She has only been taking her Synthroid on a daily basis for 1 week.  Due to this and her small size, I do not want to increase her Synthroid from 150 a day to 175 a day until she has been on the 150 every day for at least 6 weeks.  Therefore, I am returning her to 150 mcg daily and she needs to take this for the next 5 weeks.  We can then recheck a TSH level and adjust as needed.

## 2019-09-28 NOTE — Telephone Encounter (Signed)
Dr Software engineer, please enter the new levothyroxine order of 146mcg every day 1 hour before breakfast

## 2019-09-28 NOTE — Telephone Encounter (Signed)
I have done so, she actually is using [facility] due to being a facility at this time

## 2019-09-29 ENCOUNTER — Encounter: Payer: Self-pay | Admitting: Licensed Clinical Social Worker

## 2019-09-29 ENCOUNTER — Telehealth: Payer: Self-pay | Admitting: Licensed Clinical Social Worker

## 2019-09-29 LAB — TGAB+THYROGLOBULIN IMA OR RIA: Thyroglobulin Antibody: <1 IU/mL (ref 0.0–0.9)

## 2019-09-29 LAB — THYROGLOBULIN BY IMA: Thyroglobulin by IMA: <0.1 ng/mL — ABNORMAL LOW (ref 1.5–38.5)

## 2019-09-29 NOTE — Telephone Encounter (Signed)
Thank you, I will continue to follow with her.

## 2019-09-29 NOTE — Telephone Encounter (Signed)
Patient was contacted but was unavailable. A letter will be mailed to try to gain contact.

## 2019-10-02 ENCOUNTER — Other Ambulatory Visit: Payer: Self-pay

## 2019-10-02 ENCOUNTER — Ambulatory Visit (HOSPITAL_COMMUNITY)
Admission: RE | Admit: 2019-10-02 | Discharge: 2019-10-02 | Disposition: A | Discharge status: Home / Self Care | Payer: Self-pay | Source: Ambulatory Visit | Attending: Internal Medicine | Admitting: Internal Medicine

## 2019-10-02 DIAGNOSIS — Z85850 Personal history of malignant neoplasm of thyroid: Secondary | ICD-10-CM | POA: Insufficient documentation

## 2019-10-05 ENCOUNTER — Other Ambulatory Visit: Payer: Self-pay | Admitting: *Deleted

## 2019-10-05 ENCOUNTER — Ambulatory Visit (INDEPENDENT_AMBULATORY_CARE_PROVIDER_SITE_OTHER): Payer: Self-pay | Admitting: Radiation Oncology

## 2019-10-05 ENCOUNTER — Other Ambulatory Visit: Payer: Self-pay

## 2019-10-05 ENCOUNTER — Encounter: Payer: Self-pay | Admitting: Radiation Oncology

## 2019-10-05 VITALS — BP 114/74 | HR 112 | Temp 98.2°F | Wt 106.9 lb

## 2019-10-05 DIAGNOSIS — X58XXXD Exposure to other specified factors, subsequent encounter: Secondary | ICD-10-CM

## 2019-10-05 DIAGNOSIS — G4700 Insomnia, unspecified: Secondary | ICD-10-CM

## 2019-10-05 DIAGNOSIS — Z79899 Other long term (current) drug therapy: Secondary | ICD-10-CM

## 2019-10-05 DIAGNOSIS — F339 Major depressive disorder, recurrent, unspecified: Secondary | ICD-10-CM

## 2019-10-05 DIAGNOSIS — L659 Nonscarring hair loss, unspecified: Secondary | ICD-10-CM

## 2019-10-05 DIAGNOSIS — Z923 Personal history of irradiation: Secondary | ICD-10-CM

## 2019-10-05 DIAGNOSIS — S2220XA Unspecified fracture of sternum, initial encounter for closed fracture: Secondary | ICD-10-CM | POA: Insufficient documentation

## 2019-10-05 DIAGNOSIS — S2220XK Unspecified fracture of sternum, subsequent encounter for fracture with nonunion: Secondary | ICD-10-CM

## 2019-10-05 DIAGNOSIS — F411 Generalized anxiety disorder: Secondary | ICD-10-CM

## 2019-10-05 DIAGNOSIS — S2220XD Unspecified fracture of sternum, subsequent encounter for fracture with routine healing: Secondary | ICD-10-CM

## 2019-10-05 DIAGNOSIS — Z85850 Personal history of malignant neoplasm of thyroid: Secondary | ICD-10-CM

## 2019-10-05 DIAGNOSIS — F332 Major depressive disorder, recurrent severe without psychotic features: Secondary | ICD-10-CM

## 2019-10-05 DIAGNOSIS — E079 Disorder of thyroid, unspecified: Secondary | ICD-10-CM

## 2019-10-05 DIAGNOSIS — Z79890 Hormone replacement therapy (postmenopausal): Secondary | ICD-10-CM

## 2019-10-05 DIAGNOSIS — C73 Malignant neoplasm of thyroid gland: Secondary | ICD-10-CM

## 2019-10-05 MED ORDER — DULOXETINE HCL 30 MG PO CPEP: 120.0000 mg | ORAL_CAPSULE | Freq: Every day | ORAL | Status: DC

## 2019-10-05 MED ORDER — DULOXETINE HCL 60 MG PO CPEP: 120.0000 mg | ORAL_CAPSULE | Freq: Every day | ORAL | 1 refills | Status: AC
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
## 2019-10-05 NOTE — Assessment & Plan Note (Signed)
-  pt with known complicated psychiatric hx on Cymbalta and gabapentin -she reports improvement in her mood with both but continues to feel somewhat anxious secondary to her living situation and hx of substance use -increasing Cymbalta from 90 to 120 mg daily -continue current gabapentin dose -plan to schedule appointment with Monica Stephens -follow up in 2-3 weeks

## 2019-10-05 NOTE — Assessment & Plan Note (Signed)
-  pt with known complicated psychiatric hx on Cymbalta and gabapentin -she reports improvement in her mood with both but continues to feel somewhat hopeless secondary to her living situation and hx of substance use -increasing Cymbalta from 90 to 120 mg daily -continue current gabapentin dose -plan to schedule appointment with Miquel Dunn -follow up in 2-3 weeks
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
## 2019-10-05 NOTE — Assessment & Plan Note (Signed)
-  pt w/ prior thyroid cancer (2008 thyroidectomy, radioactive iodine 03/2008. Recurrence (unknown site) 2013) who underwent thyroglobulin level testing and thyroid US last week w/ undetectable thyroglobulin levels and unremarkable thyroid ultrasound -currently on 150 mcg levothyroxine -last TSH 2 weeks ago showed an elevated TSH to 8.28 w/ normal T4 at 1.17 but unclear if patient has been on levothyroxine regularly for more than 3 weeks so will not titrate dose up at this visit -plan to follow up in 2-3 weeks for TSH recheck  -last visit we requested records from her endocrinologist but we have yet to receive them -will follow up on those records and titrate levothyroxine accordingly as patient reports that she is supposed to be thyroid suppressed and not just WNL

## 2019-10-05 NOTE — Assessment & Plan Note (Signed)
-  pt continues to experience hair loss which could be secondary to thyroid dysfunction vs stress -we are waiting to titrate levothyroxine dose based on next TSH and records from endocrinology -increasing Cymbalta to 120 mg daily to assist with anxiety/depression -plan to set up visit with Miquel Dunn for anxiety/depression  -follow up in 2-3 weeks

## 2019-10-05 NOTE — Patient Instructions (Addendum)
Thank you for coming to your appointment. It was so nice to see you. Today we discussed  Diagnoses and all orders for this visit:  Previous thyroid cancer/thyroid disorder  -your thyroglobulin levels were undetectable -your thyroid ultrasound showed no evidence of a cancer recurrence -we have requested records from your endocrinologist and will follow up on that request today -once we have records we will adjust your levothyroxine dose accordingly  -continue levothyroxine 150 mcg 1 hour before breakfast daily  Alopecia -continue levothyroxine 150 mcg 1 hour before breakfast daily -we will adjust your dose once we receive records from your endocrinologist   Severe episode of recurrent major depressive disorder, without psychotic features/generalized anxiety disorder -we will increase your cymbalta to 120 mg daily -continue current dose of gabapentin and trazadone -we don't want to make too many medication changes at once -follow up with Miquel Dunn via telephone or in person  Sternal Fracture/chest pain -please follow up with our financial counselor so you may obtain an [place] for referral to orthopedics  -continue Cymbalta, gabapentin and ibuprofen for pain relief   I'm pleased with your thyroglobulin levels and your thyroid ultrasound. I'm pleased by your improvement in mood on Cymbalta. We will work to help you obtain insurance and/or an orange card for referral to specialty care. I look forward to seeing you again soon.  Sincerely,  Al Decant, MD

## 2019-10-06 NOTE — Progress Notes (Signed)
Internal Medicine Clinic Attending  I saw and evaluated the patient.  I personally confirmed the key portions of the history and exam documented by Dr. Lanier and I reviewed pertinent patient test results.  The assessment, diagnosis, and plan were formulated together and I agree with the documentation in the resident's note.   

## 2019-10-21 MED FILL — DULoxetine HCL 60 MG CPEP: 60 | 30 days supply | Qty: 60 | Fill #0

## 2019-10-21 MED FILL — NICOTINE 14 MG/24HR PATCH: 14 | 28 days supply | Qty: 28 | Fill #0

## 2019-10-21 MED FILL — traZODone HCL 150 MG TABS: 150 | 30 days supply | Qty: 30 | Fill #0

## 2019-11-04 ENCOUNTER — Inpatient Hospital Stay (HOSPITAL_COMMUNITY)
Admission: EM | Admit: 2019-11-04 | Discharge: 2019-12-01 | DRG: 917 | Disposition: E | Payer: Medicaid Other | Attending: Critical Care Medicine | Admitting: Critical Care Medicine

## 2019-11-04 ENCOUNTER — Inpatient Hospital Stay (HOSPITAL_COMMUNITY): Payer: Medicaid Other

## 2019-11-04 ENCOUNTER — Emergency Department (HOSPITAL_COMMUNITY): Payer: Medicaid Other

## 2019-11-04 DIAGNOSIS — G9382 Brain death: Secondary | ICD-10-CM | POA: Diagnosis not present

## 2019-11-04 DIAGNOSIS — Z818 Family history of other mental and behavioral disorders: Secondary | ICD-10-CM

## 2019-11-04 DIAGNOSIS — Z87891 Personal history of nicotine dependence: Secondary | ICD-10-CM

## 2019-11-04 DIAGNOSIS — D72829 Elevated white blood cell count, unspecified: Secondary | ICD-10-CM | POA: Diagnosis not present

## 2019-11-04 DIAGNOSIS — I469 Cardiac arrest, cause unspecified: Secondary | ICD-10-CM

## 2019-11-04 DIAGNOSIS — N170 Acute kidney failure with tubular necrosis: Secondary | ICD-10-CM | POA: Diagnosis not present

## 2019-11-04 DIAGNOSIS — F4310 Post-traumatic stress disorder, unspecified: Secondary | ICD-10-CM | POA: Diagnosis present

## 2019-11-04 DIAGNOSIS — Z66 Do not resuscitate: Secondary | ICD-10-CM | POA: Diagnosis present

## 2019-11-04 DIAGNOSIS — Z807 Family history of other malignant neoplasms of lymphoid, hematopoietic and related tissues: Secondary | ICD-10-CM

## 2019-11-04 DIAGNOSIS — T401X1A Poisoning by heroin, accidental (unintentional), initial encounter: Secondary | ICD-10-CM | POA: Diagnosis present

## 2019-11-04 DIAGNOSIS — Z86711 Personal history of pulmonary embolism: Secondary | ICD-10-CM

## 2019-11-04 DIAGNOSIS — Z79890 Hormone replacement therapy (postmenopausal): Secondary | ICD-10-CM

## 2019-11-04 DIAGNOSIS — Z529 Donor of unspecified organ or tissue: Secondary | ICD-10-CM

## 2019-11-04 DIAGNOSIS — D696 Thrombocytopenia, unspecified: Secondary | ICD-10-CM | POA: Diagnosis not present

## 2019-11-04 DIAGNOSIS — Z8614 Personal history of Methicillin resistant Staphylococcus aureus infection: Secondary | ICD-10-CM | POA: Diagnosis not present

## 2019-11-04 DIAGNOSIS — Z808 Family history of malignant neoplasm of other organs or systems: Secondary | ICD-10-CM

## 2019-11-04 DIAGNOSIS — G931 Anoxic brain damage, not elsewhere classified: Secondary | ICD-10-CM | POA: Diagnosis present

## 2019-11-04 DIAGNOSIS — F411 Generalized anxiety disorder: Secondary | ICD-10-CM | POA: Diagnosis present

## 2019-11-04 DIAGNOSIS — F1120 Opioid dependence, uncomplicated: Secondary | ICD-10-CM | POA: Diagnosis present

## 2019-11-04 DIAGNOSIS — Z8249 Family history of ischemic heart disease and other diseases of the circulatory system: Secondary | ICD-10-CM

## 2019-11-04 DIAGNOSIS — F1910 Other psychoactive substance abuse, uncomplicated: Secondary | ICD-10-CM | POA: Diagnosis present

## 2019-11-04 DIAGNOSIS — E870 Hyperosmolality and hypernatremia: Secondary | ICD-10-CM | POA: Diagnosis not present

## 2019-11-04 DIAGNOSIS — F410 Panic disorder [episodic paroxysmal anxiety] without agoraphobia: Secondary | ICD-10-CM | POA: Diagnosis present

## 2019-11-04 DIAGNOSIS — J9601 Acute respiratory failure with hypoxia: Secondary | ICD-10-CM | POA: Diagnosis present

## 2019-11-04 DIAGNOSIS — R9431 Abnormal electrocardiogram [ECG] [EKG]: Secondary | ICD-10-CM | POA: Diagnosis present

## 2019-11-04 DIAGNOSIS — G936 Cerebral edema: Secondary | ICD-10-CM | POA: Diagnosis present

## 2019-11-04 DIAGNOSIS — D649 Anemia, unspecified: Secondary | ICD-10-CM | POA: Diagnosis present

## 2019-11-04 DIAGNOSIS — Z4659 Encounter for fitting and adjustment of other gastrointestinal appliance and device: Secondary | ICD-10-CM

## 2019-11-04 DIAGNOSIS — F329 Major depressive disorder, single episode, unspecified: Secondary | ICD-10-CM | POA: Diagnosis present

## 2019-11-04 DIAGNOSIS — Z9049 Acquired absence of other specified parts of digestive tract: Secondary | ICD-10-CM

## 2019-11-04 DIAGNOSIS — Z20828 Contact with and (suspected) exposure to other viral communicable diseases: Secondary | ICD-10-CM | POA: Diagnosis present

## 2019-11-04 DIAGNOSIS — Z820 Family history of epilepsy and other diseases of the nervous system: Secondary | ICD-10-CM

## 2019-11-04 DIAGNOSIS — K7200 Acute and subacute hepatic failure without coma: Secondary | ICD-10-CM | POA: Diagnosis present

## 2019-11-04 DIAGNOSIS — Z526 Liver donor: Secondary | ICD-10-CM

## 2019-11-04 DIAGNOSIS — Z79899 Other long term (current) drug therapy: Secondary | ICD-10-CM

## 2019-11-04 DIAGNOSIS — Z85850 Personal history of malignant neoplasm of thyroid: Secondary | ICD-10-CM

## 2019-11-04 DIAGNOSIS — R0902 Hypoxemia: Secondary | ICD-10-CM

## 2019-11-04 DIAGNOSIS — E440 Moderate protein-calorie malnutrition: Secondary | ICD-10-CM | POA: Insufficient documentation

## 2019-11-04 DIAGNOSIS — T50904A Poisoning by unspecified drugs, medicaments and biological substances, undetermined, initial encounter: Secondary | ICD-10-CM

## 2019-11-04 DIAGNOSIS — J811 Chronic pulmonary edema: Secondary | ICD-10-CM | POA: Diagnosis present

## 2019-11-04 DIAGNOSIS — E890 Postprocedural hypothyroidism: Secondary | ICD-10-CM | POA: Diagnosis present

## 2019-11-04 LAB — COMPREHENSIVE METABOLIC PANEL
ALT: 203 U/L — ABNORMAL HIGH (ref 0–44)
ALT: 248 U/L — ABNORMAL HIGH (ref 0–44)
AST: 304 U/L — ABNORMAL HIGH (ref 15–41)
AST: 462 U/L — ABNORMAL HIGH (ref 15–41)
Albumin: 2.8 g/dL — ABNORMAL LOW (ref 3.5–5.0)
Albumin: 2.8 g/dL — ABNORMAL LOW (ref 3.5–5.0)
Alkaline Phosphatase: 154 U/L — ABNORMAL HIGH (ref 38–126)
Alkaline Phosphatase: 210 U/L — ABNORMAL HIGH (ref 38–126)
Anion gap: 17 — ABNORMAL HIGH (ref 5–15)
Anion gap: 19 — ABNORMAL HIGH (ref 5–15)
BUN: 18 mg/dL (ref 6–20)
BUN: 19 mg/dL (ref 6–20)
CO2: 13 mmol/L — ABNORMAL LOW (ref 22–32)
CO2: 21 mmol/L — ABNORMAL LOW (ref 22–32)
Calcium: 7.6 mg/dL — ABNORMAL LOW (ref 8.9–10.3)
Calcium: 7.9 mg/dL — ABNORMAL LOW (ref 8.9–10.3)
Chloride: 110 mmol/L (ref 98–111)
Chloride: 105 mmol/L (ref 98–111)
Creatinine, Ser: 1.47 mg/dL — ABNORMAL HIGH (ref 0.44–1.00)
Creatinine, Ser: 1.43 mg/dL — ABNORMAL HIGH (ref 0.44–1.00)
GFR calc Af Amer: 53 mL/min — ABNORMAL LOW (ref >60)
GFR calc Af Amer: 54 mL/min — ABNORMAL LOW (ref >60)
GFR calc non Af Amer: 45 mL/min — ABNORMAL LOW (ref >60)
GFR calc non Af Amer: 47 mL/min — ABNORMAL LOW (ref >60)
Glucose, Bld: 346 mg/dL — ABNORMAL HIGH (ref 70–99)
Glucose, Bld: 309 mg/dL — ABNORMAL HIGH (ref 70–99)
Potassium: 3.9 mmol/L (ref 3.5–5.1)
Potassium: 2.7 mmol/L — CL (ref 3.5–5.1)
Sodium: 140 mmol/L (ref 135–145)
Sodium: 145 mmol/L (ref 135–145)
Total Bilirubin: 0.1 mg/dL — ABNORMAL LOW (ref 0.3–1.2)
Total Bilirubin: 0.7 mg/dL (ref 0.3–1.2)
Total Protein: 5.2 g/dL — ABNORMAL LOW (ref 6.5–8.1)
Total Protein: 5.9 g/dL — ABNORMAL LOW (ref 6.5–8.1)

## 2019-11-04 LAB — URINALYSIS, ROUTINE W REFLEX MICROSCOPIC
Bilirubin Urine: NEGATIVE
Glucose, UA: >=500 mg/dL — AB
Ketones, ur: NEGATIVE mg/dL
Leukocytes,Ua: NEGATIVE
Nitrite: NEGATIVE
Protein, ur: >=300 mg/dL — AB
Specific Gravity, Urine: 1.006 (ref 1.005–1.030)
pH: 8 (ref 5.0–8.0)

## 2019-11-04 LAB — HEMOGLOBIN A1C
Hgb A1c MFr Bld: 5.7 % — ABNORMAL HIGH (ref 4.8–5.6)
Mean Plasma Glucose: 116.89 mg/dL

## 2019-11-04 LAB — CBC
HCT: 38.3 % (ref 36.0–46.0)
Hemoglobin: 10.4 g/dL — ABNORMAL LOW (ref 12.0–15.0)
MCH: 26.3 pg (ref 26.0–34.0)
MCHC: 27.2 g/dL — ABNORMAL LOW (ref 30.0–36.0)
MCV: 96.7 fL (ref 80.0–100.0)
Platelets: 283 10*3/uL (ref 150–400)
RBC: 3.96 MIL/uL (ref 3.87–5.11)
RDW: 17.5 % — ABNORMAL HIGH (ref 11.5–15.5)
WBC: 21.9 10*3/uL — ABNORMAL HIGH (ref 4.0–10.5)
nRBC: 0.1 % (ref 0.0–0.2)

## 2019-11-04 LAB — POCT I-STAT 7, (LYTES, BLD GAS, ICA,H+H)
Acid-base deficit: 17 mmol/L — ABNORMAL HIGH (ref 0.0–2.0)
Acid-base deficit: 7 mmol/L — ABNORMAL HIGH (ref 0.0–2.0)
Acid-base deficit: 4 mmol/L — ABNORMAL HIGH (ref 0.0–2.0)
Bicarbonate: 15.2 mmol/L — ABNORMAL LOW (ref 20.0–28.0)
Bicarbonate: 21.4 mmol/L (ref 20.0–28.0)
Bicarbonate: 22.4 mmol/L (ref 20.0–28.0)
Calcium, Ion: 1.05 mmol/L — ABNORMAL LOW (ref 1.15–1.40)
Calcium, Ion: 1.01 mmol/L — ABNORMAL LOW (ref 1.15–1.40)
Calcium, Ion: 0.94 mmol/L — ABNORMAL LOW (ref 1.15–1.40)
HCT: 34 % — ABNORMAL LOW (ref 36.0–46.0)
HCT: 37 % (ref 36.0–46.0)
HCT: 37 % (ref 36.0–46.0)
Hemoglobin: 11.6 g/dL — ABNORMAL LOW (ref 12.0–15.0)
Hemoglobin: 12.6 g/dL (ref 12.0–15.0)
Hemoglobin: 12.6 g/dL (ref 12.0–15.0)
O2 Saturation: 91 %
O2 Saturation: 66 %
O2 Saturation: 89 %
Patient temperature: 86.5
Patient temperature: 29.4
Patient temperature: 34.2
Potassium: 3.7 mmol/L (ref 3.5–5.1)
Potassium: 2.8 mmol/L — ABNORMAL LOW (ref 3.5–5.1)
Potassium: 2.9 mmol/L — ABNORMAL LOW (ref 3.5–5.1)
Sodium: 141 mmol/L (ref 135–145)
Sodium: 146 mmol/L — ABNORMAL HIGH (ref 135–145)
Sodium: 146 mmol/L — ABNORMAL HIGH (ref 135–145)
TCO2: 17 mmol/L — ABNORMAL LOW (ref 22–32)
TCO2: 23 mmol/L (ref 22–32)
TCO2: 24 mmol/L (ref 22–32)
pCO2 arterial: 52 mmHg — ABNORMAL HIGH (ref 32.0–48.0)
pCO2 arterial: 37.5 mmHg (ref 32.0–48.0)
pCO2 arterial: 41 mmHg (ref 32.0–48.0)
pH, Arterial: 7.026 — CL (ref 7.350–7.450)
pH, Arterial: 7.323 — ABNORMAL LOW (ref 7.350–7.450)
pH, Arterial: 7.33 — ABNORMAL LOW (ref 7.350–7.450)
pO2, Arterial: 65 mmHg — ABNORMAL LOW (ref 83.0–108.0)
pO2, Arterial: 24 mmHg — CL (ref 83.0–108.0)
pO2, Arterial: 53 mmHg — ABNORMAL LOW (ref 83.0–108.0)

## 2019-11-04 LAB — SARS CORONAVIRUS 2 BY RT PCR (HOSPITAL ORDER, PERFORMED IN ~~LOC~~ HOSPITAL LAB): SARS Coronavirus 2: NEGATIVE

## 2019-11-04 LAB — ACETAMINOPHEN LEVEL: Acetaminophen (Tylenol), Serum: <10 ug/mL — ABNORMAL LOW (ref 10–30)

## 2019-11-04 LAB — PROTIME-INR
INR: 1.8 — ABNORMAL HIGH (ref 0.8–1.2)
INR: 1.9 — ABNORMAL HIGH (ref 0.8–1.2)
Prothrombin Time: 20.4 seconds — ABNORMAL HIGH (ref 11.4–15.2)
Prothrombin Time: 21.9 seconds — ABNORMAL HIGH (ref 11.4–15.2)

## 2019-11-04 LAB — RAPID URINE DRUG SCREEN, HOSP PERFORMED
Amphetamines: NOT DETECTED
Barbiturates: NOT DETECTED
Benzodiazepines: NOT DETECTED
Cocaine: NOT DETECTED
Opiates: NOT DETECTED
Tetrahydrocannabinol: NOT DETECTED

## 2019-11-04 LAB — I-STAT BETA HCG BLOOD, ED (MC, WL, AP ONLY): I-stat hCG, quantitative: <5 m[IU]/mL (ref <5)

## 2019-11-04 LAB — GLUCOSE, CAPILLARY
Glucose-Capillary: 207 mg/dL — ABNORMAL HIGH (ref 70–99)
Glucose-Capillary: 296 mg/dL — ABNORMAL HIGH (ref 70–99)
Glucose-Capillary: 94 mg/dL (ref 70–99)

## 2019-11-04 LAB — TROPONIN I (HIGH SENSITIVITY)
Troponin I (High Sensitivity): 314 ng/L (ref <18)
Troponin I (High Sensitivity): 822 ng/L (ref <18)

## 2019-11-04 LAB — ECHOCARDIOGRAM COMPLETE
Height: 63 in
Weight: 1600 oz

## 2019-11-04 LAB — LIPASE, BLOOD: Lipase: 41 U/L (ref 11–51)

## 2019-11-04 LAB — LACTIC ACID, PLASMA: Lactic Acid, Venous: 7.6 mmol/L (ref 0.5–1.9)

## 2019-11-04 LAB — APTT: aPTT: 60 seconds — ABNORMAL HIGH (ref 24–36)

## 2019-11-04 LAB — ETHANOL: Alcohol, Ethyl (B): <10 mg/dL (ref <10)

## 2019-11-04 IMAGING — DX DG CHEST 1V PORT
1 series · 1 of 1 positions shown · non-contrast
Comparison: [DATE]

CLINICAL DATA: Hypoxia

EXAM:
PORTABLE CHEST 1 VIEW

[chest]
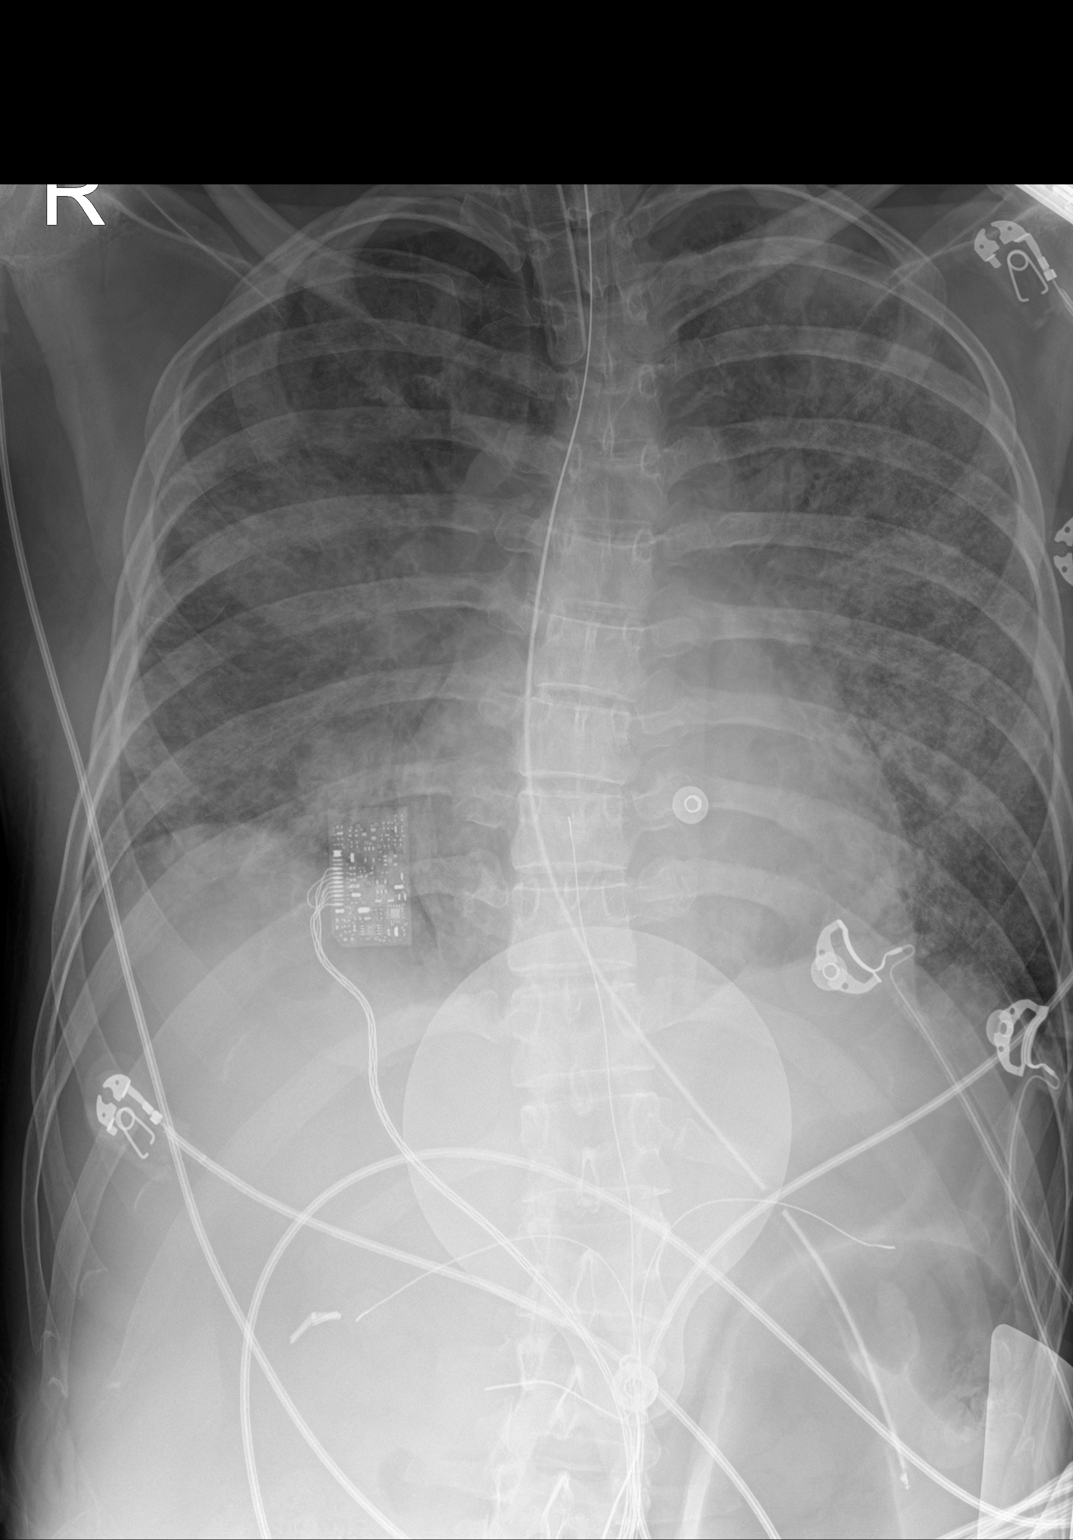

[1 of 1 positions shown; findings below may reference images not displayed]

FINDINGS: Endotracheal tube tip is 3.7 cm above the carina. Nasogastric tube
tip and side port are in the stomach. No pneumothorax. There is
widespread interstitial and alveolar opacity, likely due to diffuse
edema. A degree of superimposed pneumonia, however, must be of
concern. Heart is upper normal in size with pulmonary vascularity
normal. No adenopathy. There is midthoracic levoscoliosis as well as
thoracolumbar levoscoliosis. There are surgical clips at the
cervicothoracic junction, stable.
IMPRESSION: Tube positions as described without pneumothorax. Widespread
interstitial and alveolar opacity. Question diffuse noncardiogenic
edema versus multifocal pneumonia. Aspiration could also be present.
More than one of these entities may well be present concurrently.
Heart upper normal in size. No adenopathy appreciable.

## 2019-11-04 IMAGING — CT CT HEAD W/O CM
5 of 8 series · 13 of 33 positions shown, 14 images · non-contrast
Comparison: [DATE]

CLINICAL DATA: Altered level consciousness, found down.

EXAM:
CT HEAD WITHOUT CONTRAST
TECHNIQUE: Contiguous axial images were obtained from the base of the skull
through the vertex without intravenous contrast.

[Series 4: head bone · axial · 0.44mm/px · z∈[-68,-12]mm · 2 of 84 slices shown]
[im 28/84  bone]
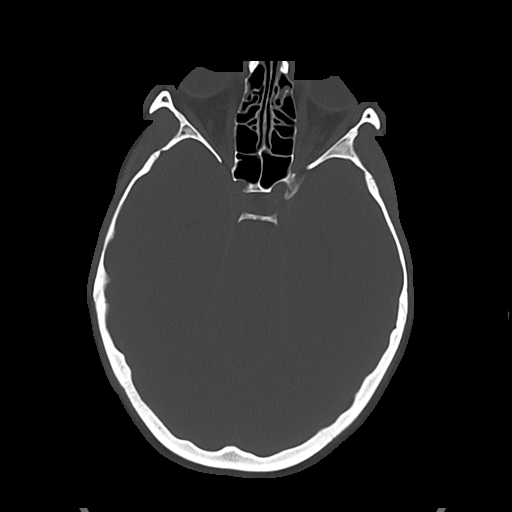
[im 56/84  bone]
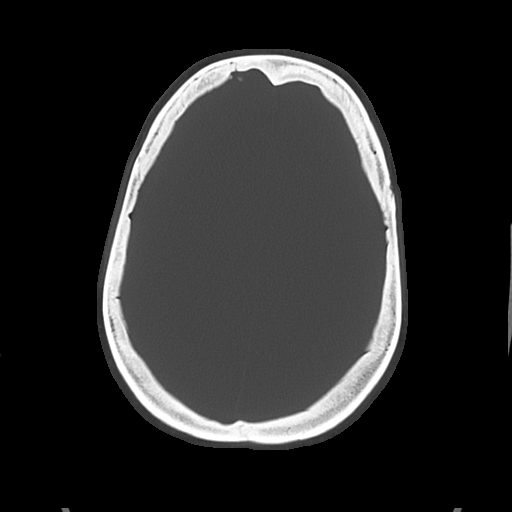

[Series 11: c spine soft · axial · 0.27mm/px · z∈[-240,-132]mm · 3 of 108 slices shown]
[im 27/108  soft-tissue]
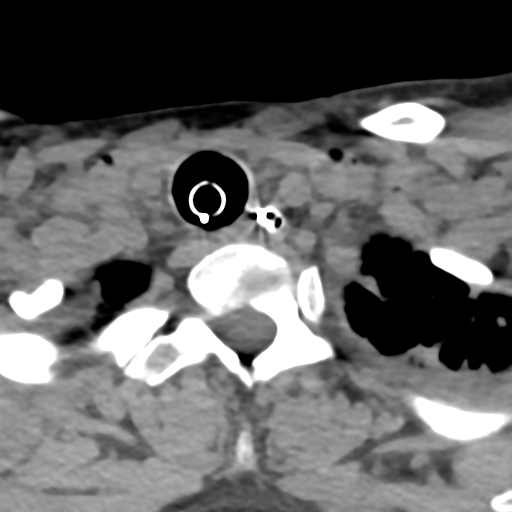
[im 54/108  soft-tissue]
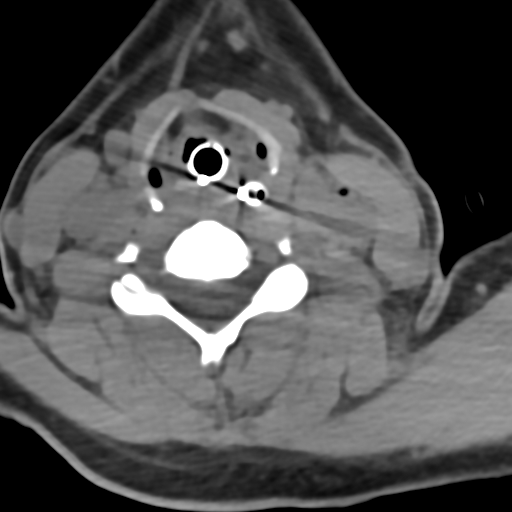
[im 81/108  soft-tissue]
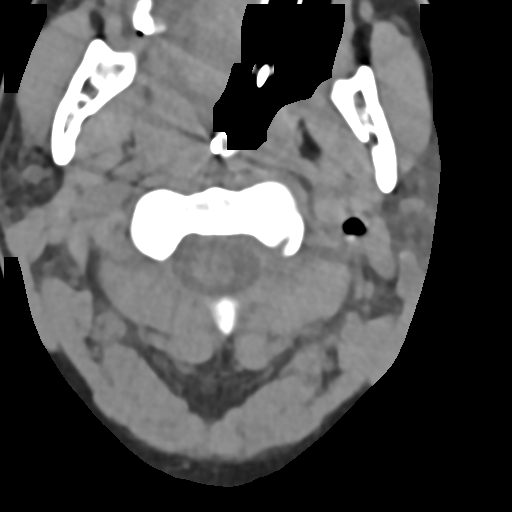

[Series 14: sag bone · sagittal · 0.34mm/px · 5 of 97 slices shown]
[im 14/97  bone]
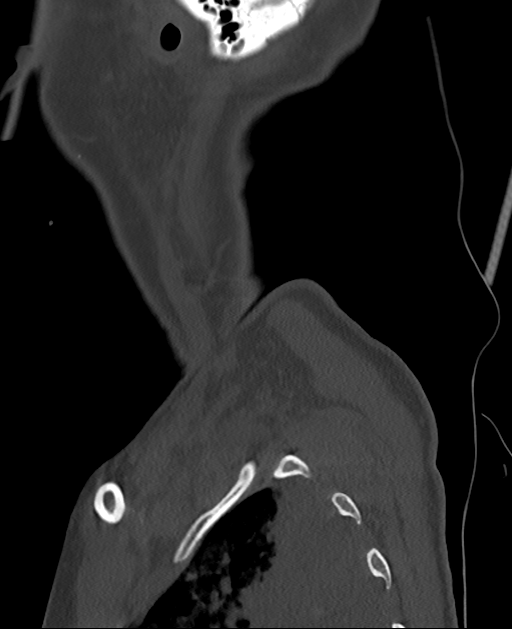
[im 28/97  bone]
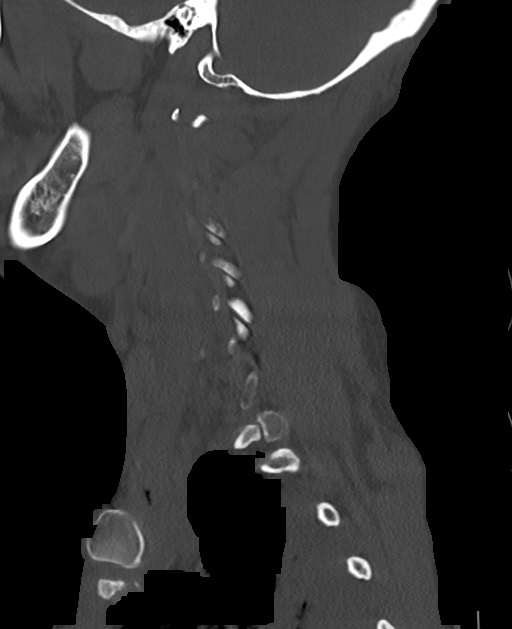
[im 42/97  bone]
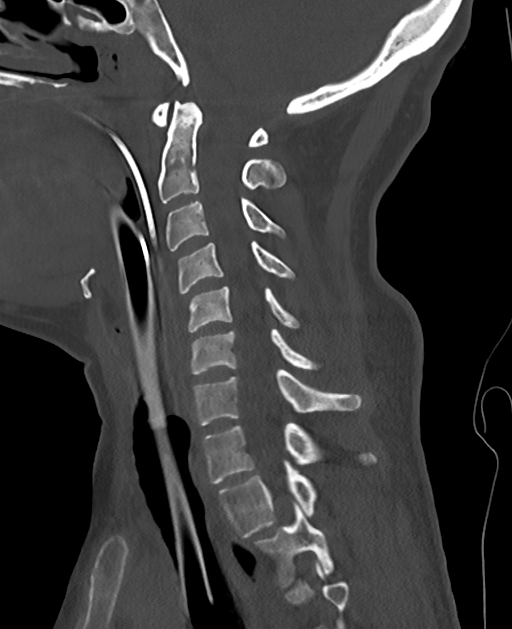
[im 55/97  bone]
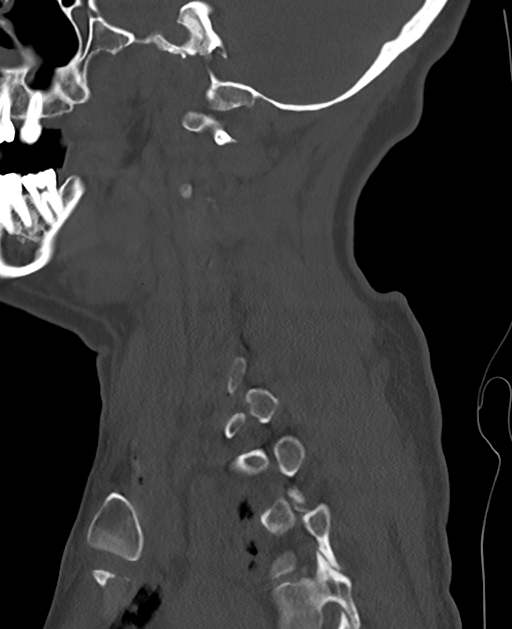
[im 69/97  bone]
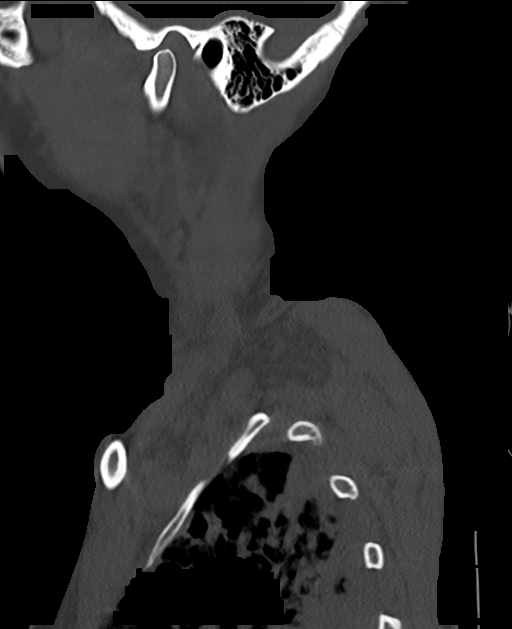

[Series 15: cor bone · coronal · 0.35mm/px · 1 of 75 slices shown]
[im 38/75  bone]
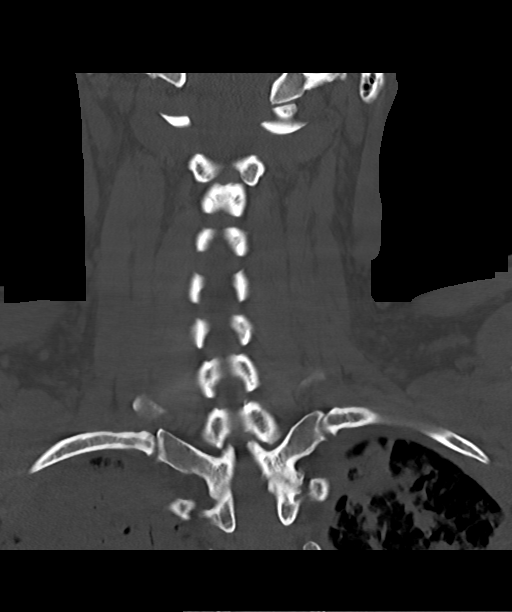

[Series 16: orthogonal axials · axial · 0.21mm/px · z∈[-216,-160]mm · 2 of 84 slices shown, 3 images]
[im 28/84  soft-tissue]
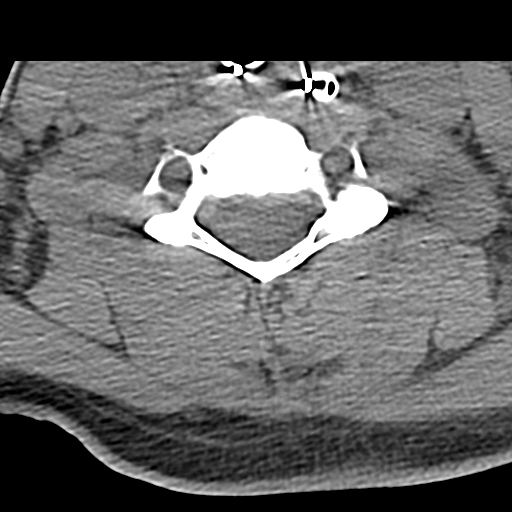
[im 28/84  bone]
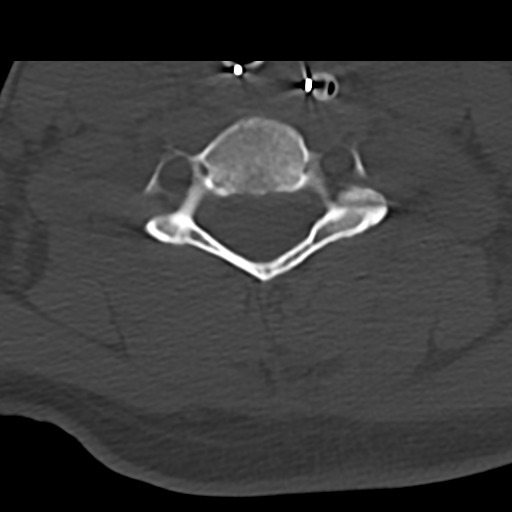
[im 56/84  bone]
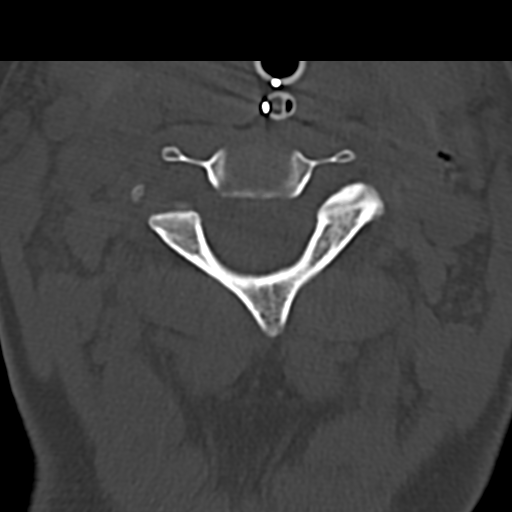

[13 of 33 positions shown; findings below may reference images not displayed]

FINDINGS: Brain: Diffuse loss of gray-white differentiation, effacement of
basilar cisterns and partial effacement of lateral ventricles. Loss
of bilateral sulcation.

No signs of intracranial hemorrhage.

Vascular: Not well assessed, no overt abnormality.

Skull: Normal. Negative for fracture or focal lesion.

Sinuses/Orbits: No acute finding.

Other: None.
IMPRESSION: Signs of marked cerebral edema compatible with severe anoxic brain
injury.

Critical Value/emergent results were called by telephone at the time
of interpretation on [DATE] at [DATE] to provider Dr. SAVAD,
who verbally acknowledged these results.

## 2019-11-04 IMAGING — DX DG ABDOMEN 1V
1 series · 1 of 1 positions shown · non-contrast
Comparison: Abdominal x-ray dated [DATE].

CLINICAL DATA: Enteric tube placement.

EXAM:
ABDOMEN - 1 VIEW

[abdomen kub]
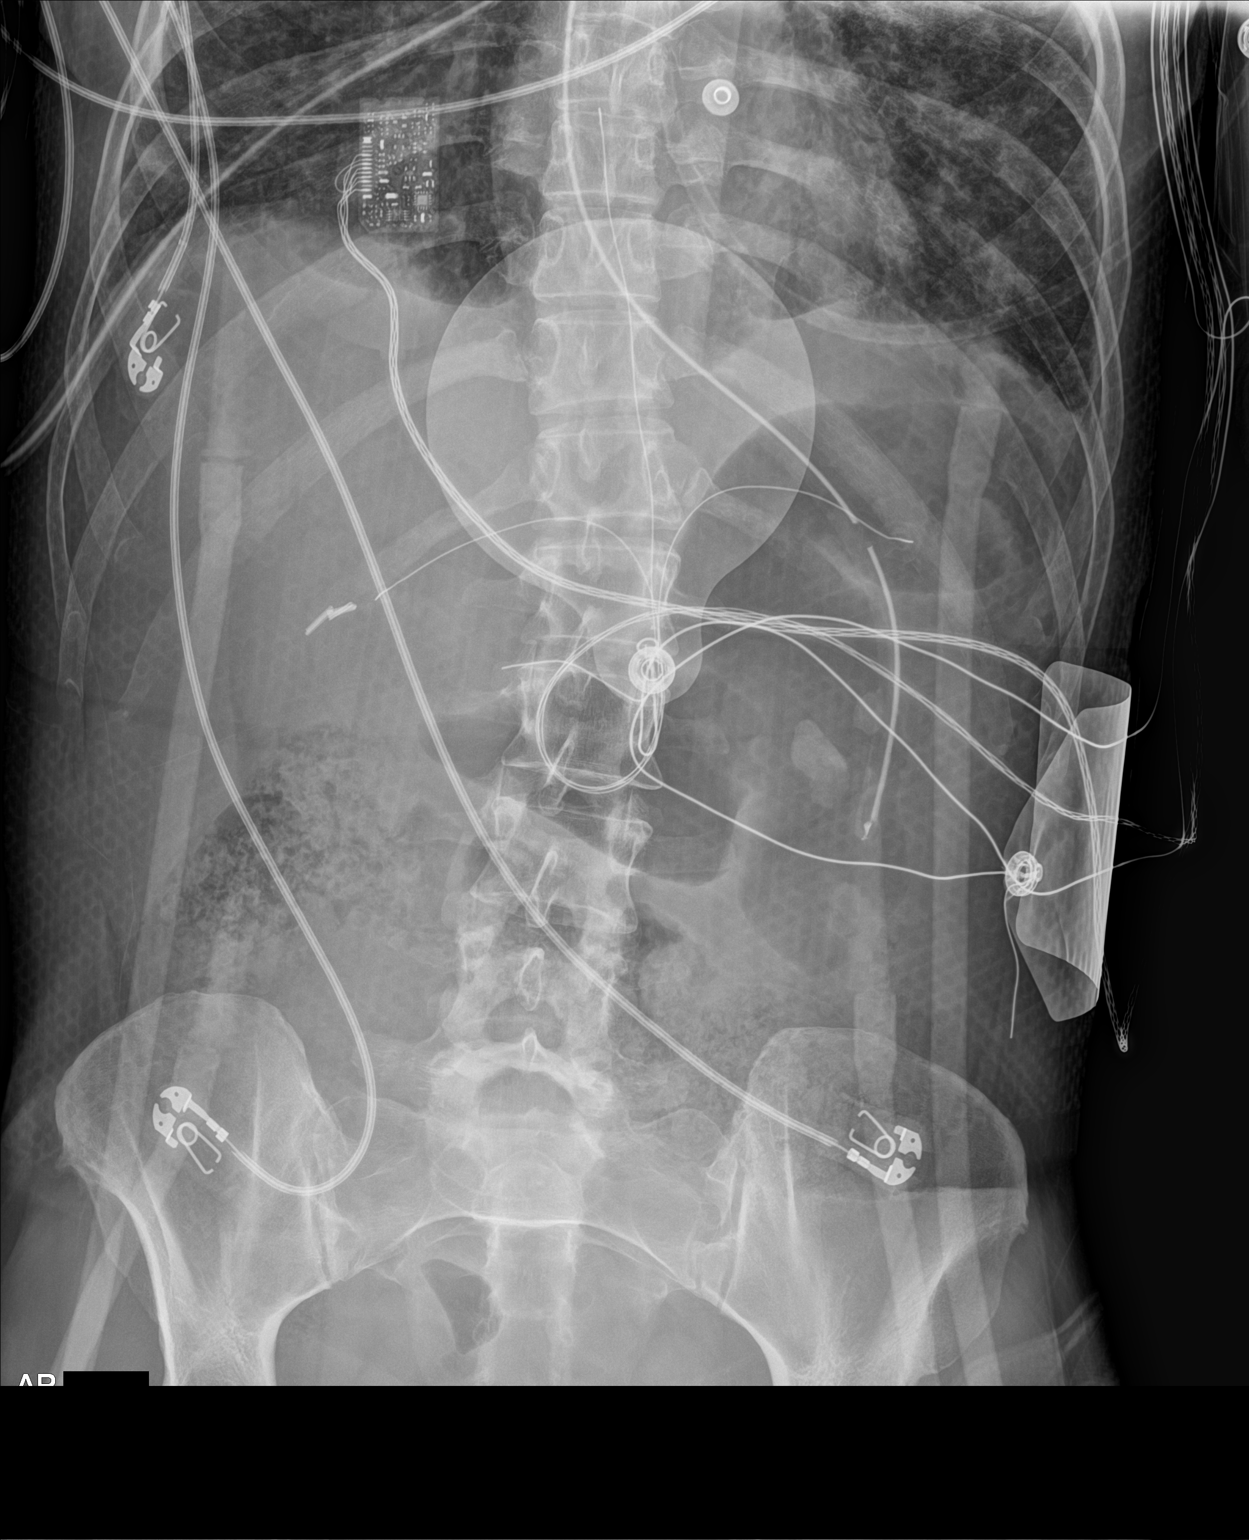

[1 of 1 positions shown; findings below may reference images not displayed]

FINDINGS: Enteric tube tip in the gastric body. The bowel gas pattern is
normal. No radio-opaque calculi or other significant radiographic
abnormality are seen.
IMPRESSION: Enteric tube in the stomach.

## 2019-11-04 IMAGING — CT CT CERVICAL SPINE W/O CM
4 of 6 series · 14 of 33 positions shown, 16 images · non-contrast
Comparison: None.

CLINICAL DATA: Found down, unresponsive.  Possible drug overdose.

EXAM:
CT CERVICAL SPINE WITHOUT CONTRAST
TECHNIQUE: Multidetector CT imaging of the cervical spine was performed without
intravenous contrast. Multiplanar CT image reconstructions were also
generated.

[Series 11: c spine soft · axial · 0.27mm/px · z∈[-222,-150]mm · 2 of 108 slices shown]
[im 36/108  soft-tissue]
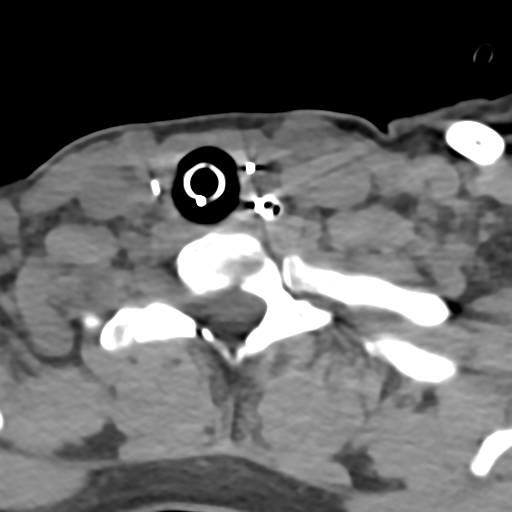
[im 72/108  soft-tissue]
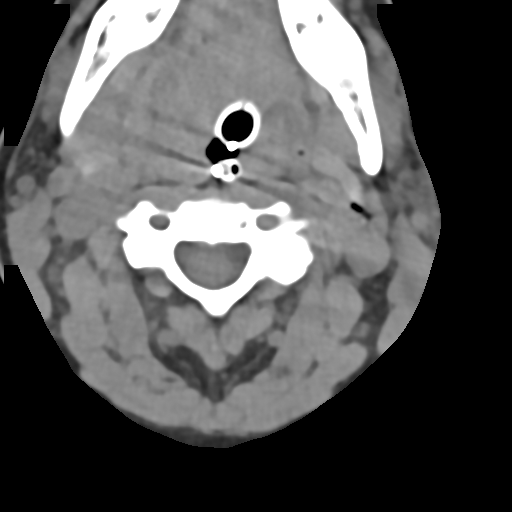

[Series 13: c spine soft thins · axial · 0.44mm/px · z∈[-250,-122]mm · 4 of 216 slices shown, 5 images]
[im 44/216  soft-tissue]
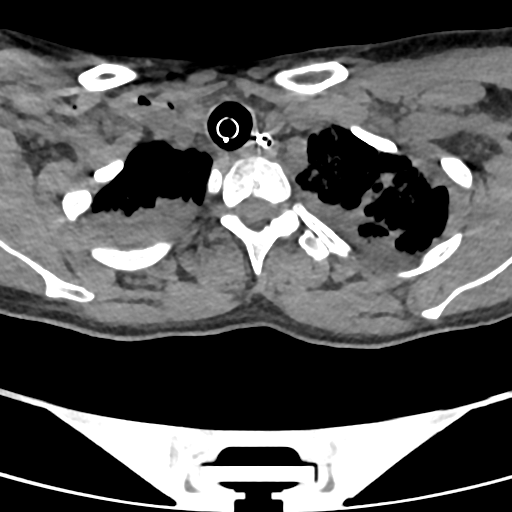
[im 44/216  bone]
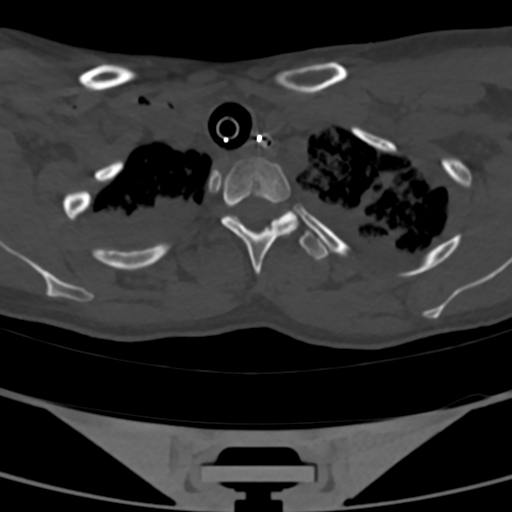
[im 87/216  bone]
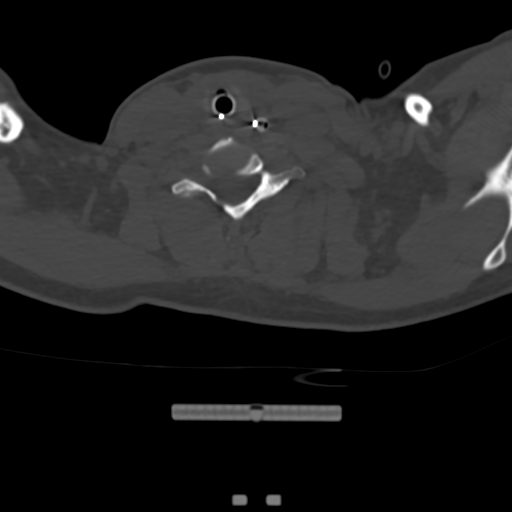
[im 130/216  bone]
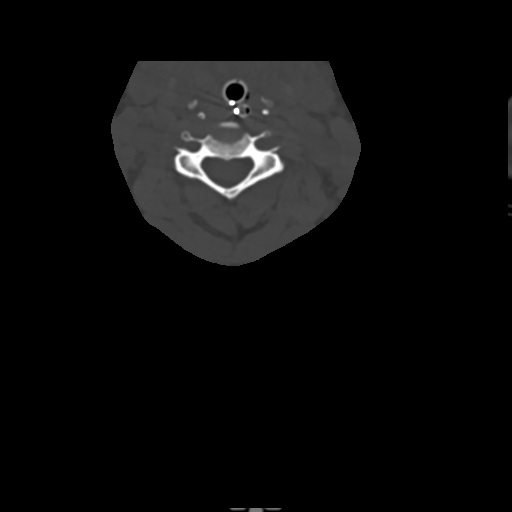
[im 173/216  bone]
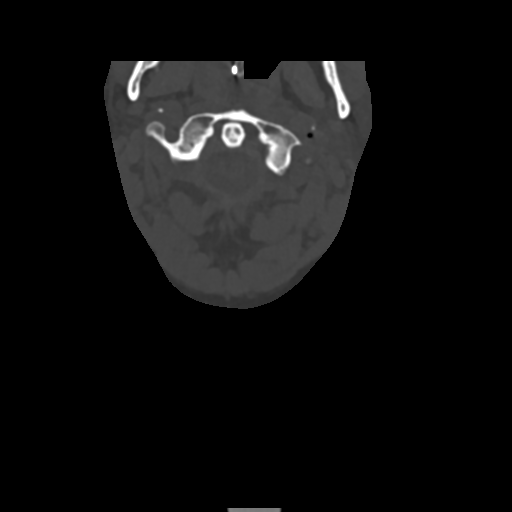

[Series 14: sag bone · sagittal · 0.34mm/px · 5 of 97 slices shown, 6 images]
[im 33/97  bone]
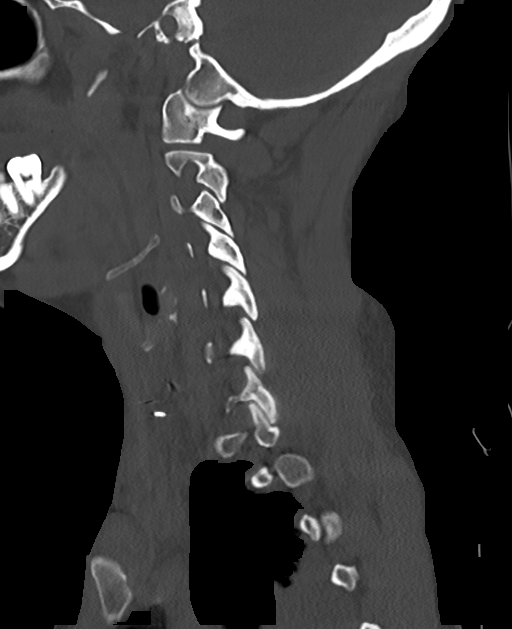
[im 41/97  bone]
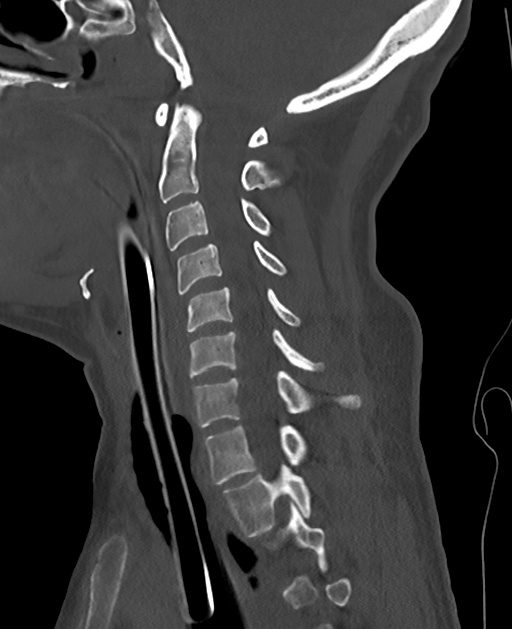
[im 49/97  soft-tissue]
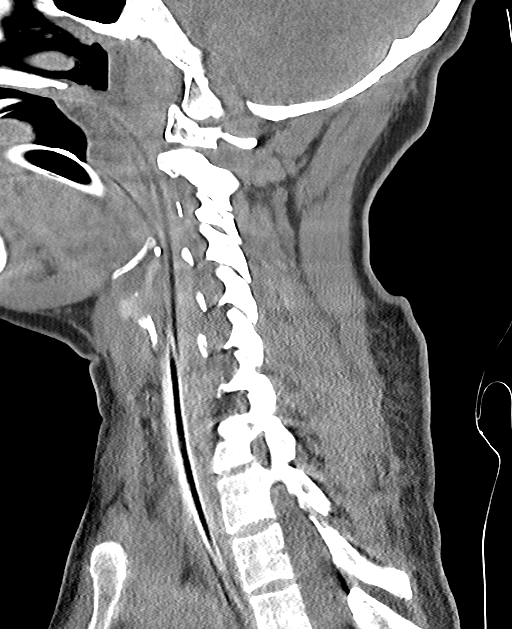
[im 49/97  bone]
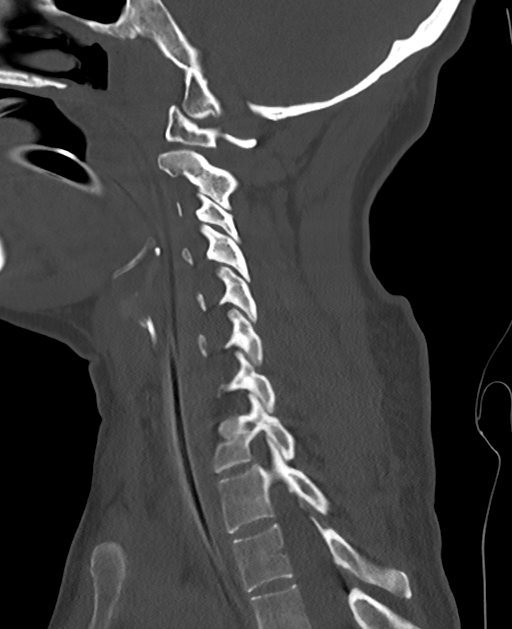
[im 57/97  bone]
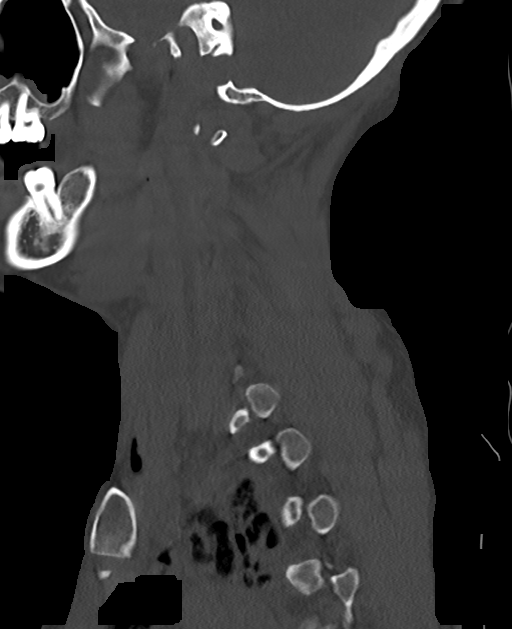
[im 65/97  bone]
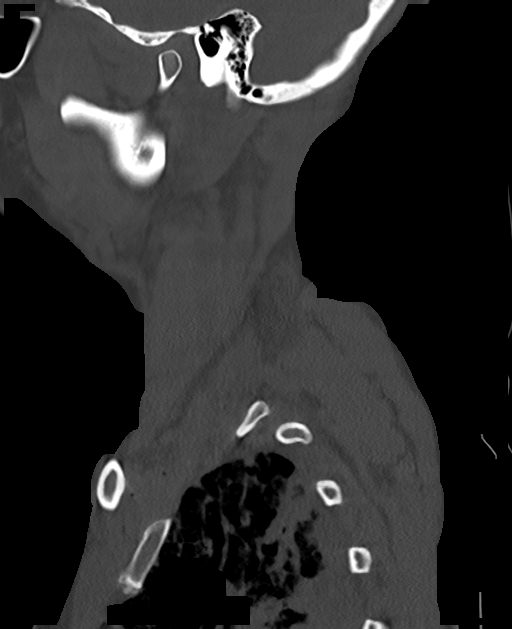

[Series 15: cor bone · coronal · 0.35mm/px · 3 of 75 slices shown]
[im 15/75  bone]
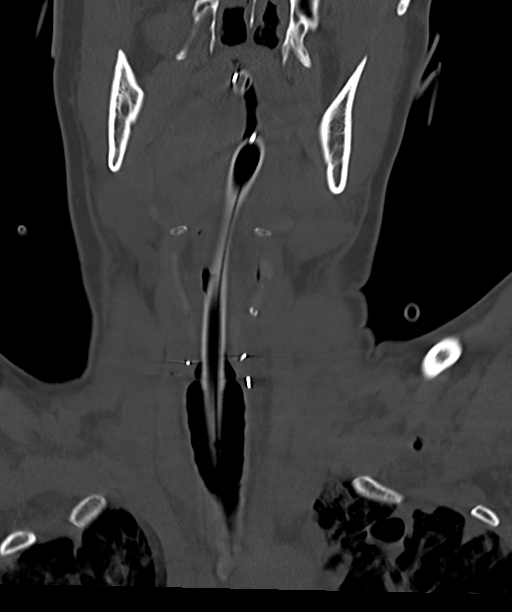
[im 30/75  bone]
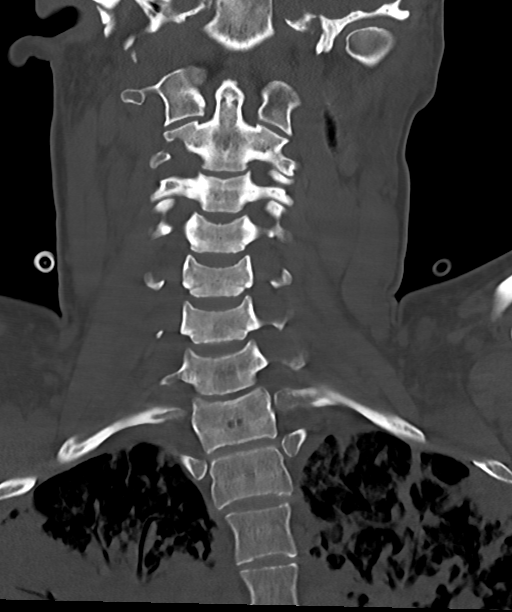
[im 45/75  bone]
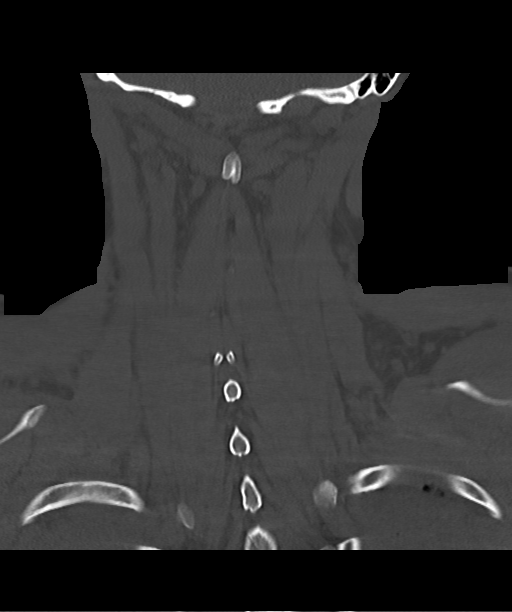

[14 of 33 positions shown; findings below may reference images not displayed]

FINDINGS: Alignment: Normal.

Skull base and vertebrae: No acute fracture. No primary bone lesion
or focal pathologic process.

Soft tissues and spinal canal: No prevertebral fluid or swelling. No
visible canal hematoma.

Disc levels: The spinal canal is generous. No spinal or foraminal
stenosis.

Upper chest: Extensive bilateral upper lobe airspace process with
areas of dense airspace consolidation. A few cystic airspaces are
also noted. Findings suggest pneumonia or possible severe
aspiration. The endotracheal tube is in good position at the mid
tracheal level. There is an NG tube coursing down the esophagus.

Other: Scattered neck nodes and also scattered air in the soft
tissues.
IMPRESSION: 1. Normal alignment of the cervical vertebral bodies and no acute
fracture.
2. No spinal canal compromise.
3. Extensive lung infiltrates.

## 2019-11-04 IMAGING — DX DG CHEST 1V PORT
1 series · 1 of 1 positions shown · non-contrast
Comparison: [DATE] at [DATE] a.m.

CLINICAL DATA: CPR, found unresponsive by EMS.

EXAM:
PORTABLE CHEST 1 VIEW

[chest ap]
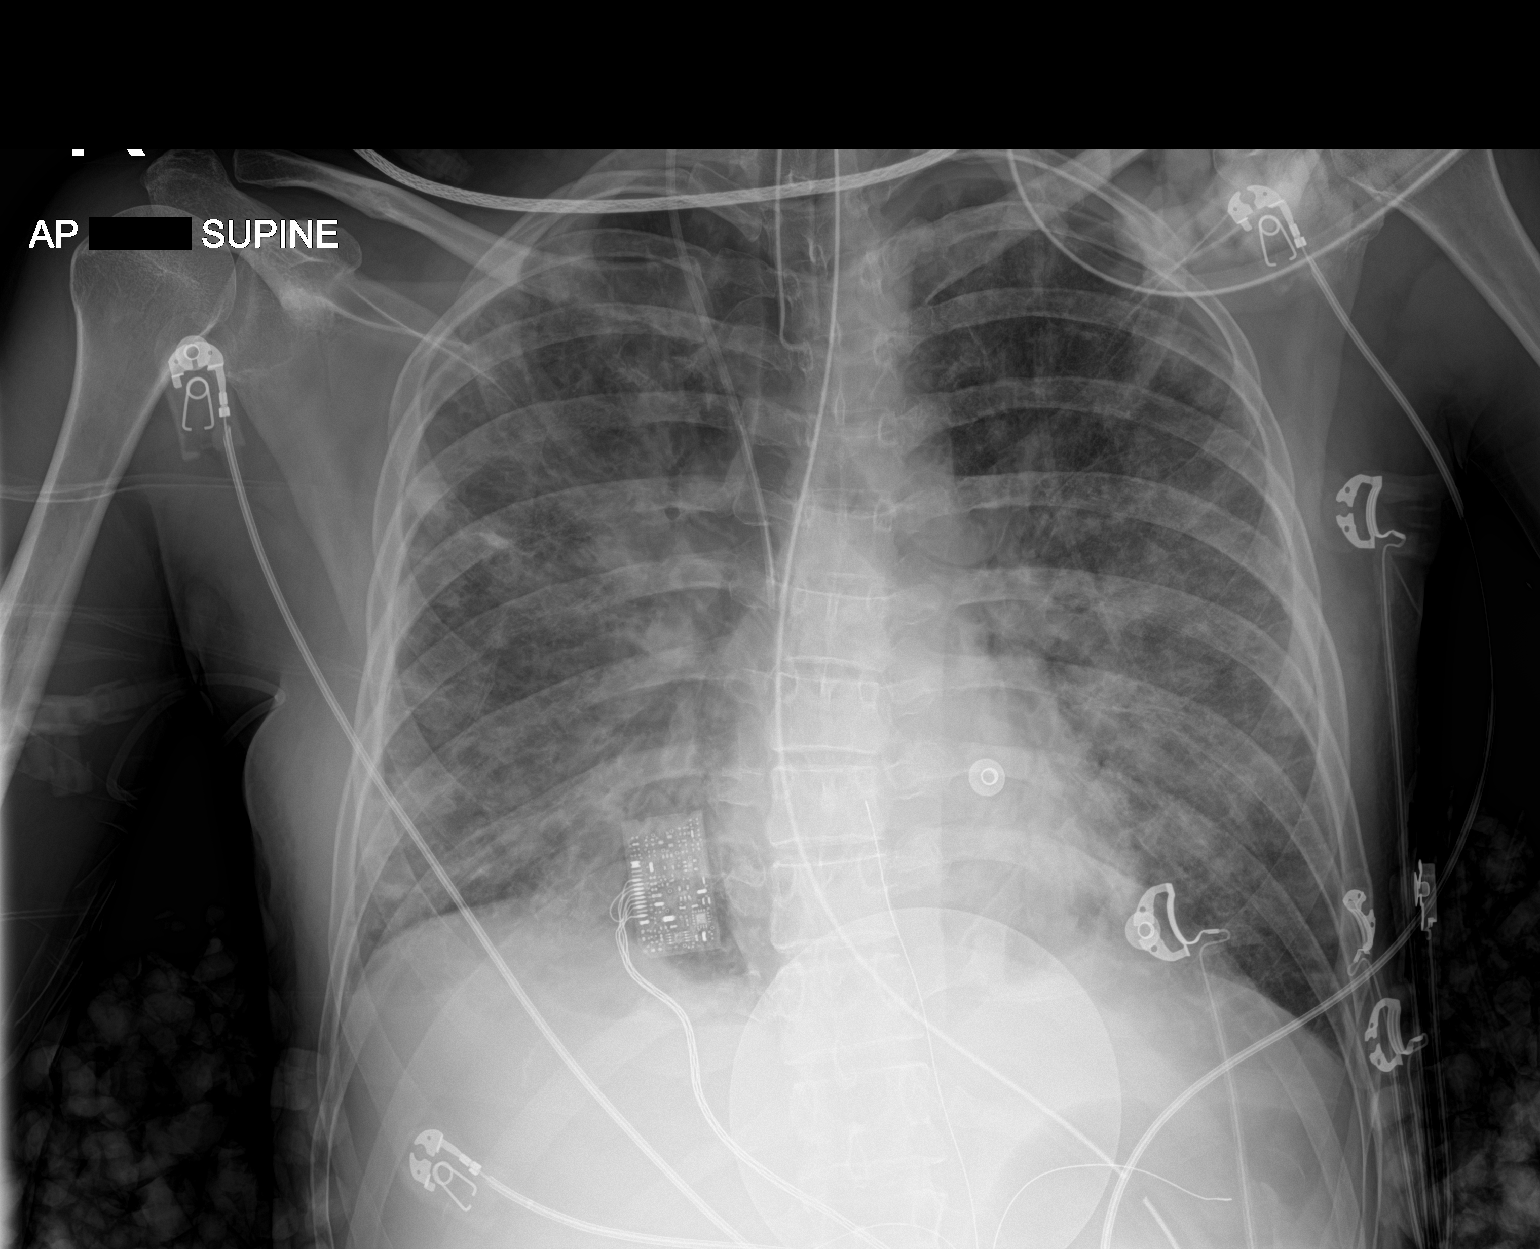

[1 of 1 positions shown; findings below may reference images not displayed]

FINDINGS: Endotracheal tube in the proximal to mid trachea. Enteric tube
passing below the left hemidiaphragm into the stomach.

Pacer defibrillator pad over the central upper abdomen/lower chest.

Interval placement of right IJ central venous catheter terminating
in the proximal SVC.

Persistent patchy opacities and diffuse interstitial thickening.
Cardiomediastinal contours are stable.

No acute bone finding.
IMPRESSION: 1. Interval placement of right IJ central venous catheter
terminating in the proximal SVC. No pneumothorax.
2. Signs of multifocal pneumonia with potential background pulmonary
edema.

## 2019-11-04 MED ORDER — SODIUM CHLORIDE 0.9 % IV SOLN
INTRAVENOUS | Status: DC | PRN
  Administered 2019-11-08: 19:00:00 via INTRA_ARTERIAL

## 2019-11-04 MED ORDER — FAMOTIDINE IN NACL 20-0.9 MG/50ML-% IV SOLN
20.0000 mg | Freq: Two times a day (BID) | INTRAVENOUS | Status: DC
  Administered 2019-11-04 (×2): 20 mg via INTRAVENOUS
  Filled 2019-11-04 (×2): qty 50

## 2019-11-04 MED ORDER — SODIUM BICARBONATE 8.4 % IV SOLN
INTRAVENOUS | Status: AC
  Filled 2019-11-04: qty 50

## 2019-11-04 MED ORDER — MIDAZOLAM HCL 2 MG/2ML IJ SOLN: 2.0000 mg | INTRAMUSCULAR | Status: DC | PRN

## 2019-11-04 MED ORDER — SODIUM CHLORIDE 0.9 % IV SOLN
INTRAVENOUS | Status: AC | PRN
  Administered 2019-11-04: 1000 mL via INTRAVENOUS

## 2019-11-04 MED ORDER — CALCIUM GLUCONATE-NACL 2-0.675 GM/100ML-% IV SOLN
2.0000 g | Freq: Once | INTRAVENOUS | Status: AC
  Administered 2019-11-04: 14:00:00 2000 mg via INTRAVENOUS
  Filled 2019-11-04: qty 100

## 2019-11-04 MED ORDER — FENTANYL CITRATE (PF) 100 MCG/2ML IJ SOLN
INTRAMUSCULAR | Status: AC
  Filled 2019-11-04: qty 2

## 2019-11-04 MED ORDER — MIDAZOLAM HCL 2 MG/2ML IJ SOLN: 2.0000 mg | Freq: Once | INTRAMUSCULAR | Status: DC

## 2019-11-04 MED ORDER — LEVOTHYROXINE SODIUM 100 MCG/5ML IV SOLN
75.0000 ug | Freq: Every day | INTRAVENOUS | Status: DC
  Administered 2019-11-04 – 2019-11-08 (×5): 75 ug via INTRAVENOUS
  Filled 2019-11-04 (×5): qty 5

## 2019-11-04 MED ORDER — NOREPINEPHRINE 16 MG/250ML-% IV SOLN
0.0000 ug/min | INTRAVENOUS | Status: DC
  Administered 2019-11-04: 15:00:00 5 ug/min via INTRAVENOUS
  Filled 2019-11-04: qty 250

## 2019-11-04 MED ORDER — ORAL CARE MOUTH RINSE
15.0000 mL | OROMUCOSAL | Status: DC
  Administered 2019-11-04 – 2019-11-08 (×41): 15 mL via OROMUCOSAL

## 2019-11-04 MED ORDER — HEPARIN SODIUM (PORCINE) 5000 UNIT/ML IJ SOLN
5000.0000 [IU] | Freq: Three times a day (TID) | INTRAMUSCULAR | Status: DC
  Administered 2019-11-04 – 2019-11-07 (×9): 5000 [IU] via SUBCUTANEOUS
  Filled 2019-11-04 (×9): qty 1

## 2019-11-04 MED ORDER — FENTANYL 2500MCG IN NS 250ML (10MCG/ML) PREMIX INFUSION: 100.0000 ug/h | INTRAVENOUS | Status: DC

## 2019-11-04 MED ORDER — SODIUM CHLORIDE 0.9 % IV SOLN
3.0000 g | Freq: Once | INTRAVENOUS | Status: DC
  Filled 2019-11-04: qty 8

## 2019-11-04 MED ORDER — FENTANYL CITRATE (PF) 100 MCG/2ML IJ SOLN: 100.0000 ug | INTRAMUSCULAR | Status: DC | PRN

## 2019-11-04 MED ORDER — CALCIUM GLUCONATE-NACL 2-0.675 GM/100ML-% IV SOLN
2.0000 g | Freq: Once | INTRAVENOUS | Status: AC
  Administered 2019-11-04: 2000 mg via INTRAVENOUS
  Filled 2019-11-04: qty 100

## 2019-11-04 MED ORDER — FENTANYL CITRATE (PF) 100 MCG/2ML IJ SOLN: 100.0000 ug | Freq: Once | INTRAMUSCULAR | Status: DC

## 2019-11-04 MED ORDER — NALOXONE HCL 2 MG/2ML IJ SOSY
PREFILLED_SYRINGE | INTRAMUSCULAR | Status: AC | PRN
  Administered 2019-11-04: 2 mg via INTRAVENOUS

## 2019-11-04 MED ORDER — MAGNESIUM SULFATE 4 GM/100ML IV SOLN
4.0000 g | Freq: Once | INTRAVENOUS | Status: AC
  Administered 2019-11-04: 12:00:00 4 g via INTRAVENOUS
  Filled 2019-11-04: qty 100

## 2019-11-04 MED ORDER — STERILE WATER FOR INJECTION IV SOLN
INTRAVENOUS | Status: DC
  Administered 2019-11-04 – 2019-11-05 (×2): via INTRAVENOUS
  Filled 2019-11-04 (×5): qty 9.71

## 2019-11-04 MED ORDER — MIDAZOLAM HCL 2 MG/2ML IJ SOLN
INTRAMUSCULAR | Status: AC
  Filled 2019-11-04: qty 2

## 2019-11-04 MED ORDER — SODIUM CHLORIDE 0.9 % IV SOLN
3.0000 g | Freq: Three times a day (TID) | INTRAVENOUS | Status: DC
  Administered 2019-11-04 – 2019-11-05 (×4): 3 g via INTRAVENOUS
  Filled 2019-11-04: qty 3
  Filled 2019-11-04: qty 8
  Filled 2019-11-04 (×3): qty 3

## 2019-11-04 MED ORDER — NALOXONE HCL 2 MG/2ML IJ SOSY
PREFILLED_SYRINGE | INTRAMUSCULAR | Status: AC
  Filled 2019-11-04: qty 2

## 2019-11-04 MED ORDER — SODIUM BICARBONATE 8.4 % IV SOLN
INTRAVENOUS | Status: AC | PRN
  Administered 2019-11-04: 50 meq via INTRAVENOUS

## 2019-11-04 MED ORDER — MIDAZOLAM BOLUS VIA INFUSION
2.0000 mg | INTRAVENOUS | Status: DC | PRN
  Filled 2019-11-04: qty 2

## 2019-11-04 MED ORDER — SODIUM BICARBONATE 8.4 % IV SOLN
100.0000 meq | Freq: Once | INTRAVENOUS | Status: AC
  Administered 2019-11-04: 100 meq via INTRAVENOUS

## 2019-11-04 MED ORDER — CHLORHEXIDINE GLUCONATE 0.12% ORAL RINSE (MEDLINE KIT)
15.0000 mL | Freq: Two times a day (BID) | OROMUCOSAL | Status: DC
  Administered 2019-11-04 – 2019-11-08 (×8): 15 mL via OROMUCOSAL

## 2019-11-04 MED ORDER — FENTANYL BOLUS VIA INFUSION
50.0000 ug | INTRAVENOUS | Status: DC | PRN
  Filled 2019-11-04: qty 50

## 2019-11-04 MED ORDER — EPINEPHRINE HCL 5 MG/250ML IV SOLN IN NS
0.5000 ug/min | INTRAVENOUS | Status: DC
  Administered 2019-11-04: 20:00:00 4 ug/min via INTRAVENOUS
  Administered 2019-11-04: 10:00:00 5 ug/min via INTRAVENOUS
  Filled 2019-11-04 (×2): qty 250

## 2019-11-04 MED ORDER — SODIUM BICARBONATE 8.4 % IV SOLN
INTRAVENOUS | Status: AC
  Filled 2019-11-04: qty 100

## 2019-11-04 MED ORDER — INSULIN ASPART 100 UNIT/ML ~~LOC~~ SOLN
0.0000 [IU] | SUBCUTANEOUS | Status: DC
  Administered 2019-11-04: 3 [IU] via SUBCUTANEOUS
  Administered 2019-11-04: 5 [IU] via SUBCUTANEOUS
  Administered 2019-11-05 – 2019-11-07 (×5): 1 [IU] via SUBCUTANEOUS
  Administered 2019-11-07 (×2): 2 [IU] via SUBCUTANEOUS

## 2019-11-04 MED ORDER — SODIUM CHLORIDE 0.9% FLUSH
3.0000 mL | Freq: Once | INTRAVENOUS | Status: AC
  Administered 2019-11-04: 17:00:00 3 mL via INTRAVENOUS

## 2019-11-04 MED ORDER — NOREPINEPHRINE 4 MG/250ML-% IV SOLN
0.0000 ug/min | INTRAVENOUS | Status: DC
  Administered 2019-11-04: 10 ug/min via INTRAVENOUS

## 2019-11-04 MED ORDER — POTASSIUM CHLORIDE 20 MEQ PO PACK
20.0000 meq | PACK | Freq: Once | ORAL | Status: AC
  Administered 2019-11-04: 16:00:00 20 meq via NASOGASTRIC
  Filled 2019-11-04: qty 1

## 2019-11-04 MED ORDER — VASOPRESSIN 20 UNIT/ML IV SOLN
0.0100 [IU]/min | INTRAVENOUS | Status: DC
  Administered 2019-11-04 – 2019-11-05 (×2): 0.04 [IU]/min via INTRAVENOUS
  Filled 2019-11-04 (×4): qty 2

## 2019-11-04 MED ORDER — SODIUM CHLORIDE 0.9 % IV SOLN
INTRAVENOUS | Status: DC
  Administered 2019-11-04: 12:00:00 via INTRAVENOUS

## 2019-11-04 MED ORDER — MIDAZOLAM 50MG/50ML (1MG/ML) PREMIX INFUSION: 2.0000 mg/h | INTRAVENOUS | Status: DC

## 2019-11-04 MED ORDER — NOREPINEPHRINE 4 MG/250ML-% IV SOLN
INTRAVENOUS | Status: AC
  Filled 2019-11-04: qty 250

## 2019-11-04 NOTE — Progress Notes (Signed)
Responded to ED to support parent of patient. Patient was found down and unconscieious at a friend house per parent and EDP.  Patient is critical and on full life support. Parent are at bedside.  Per parent request prayer was done and emotional and spiritual support was provided.  Chaplain available as needed.   Jaclynn Major, [place], [place], Pager [phone]

## 2019-11-04 NOTE — ED Notes (Signed)
ECHO at bedside.

## 2019-11-04 NOTE — Progress Notes (Signed)
EEG complete - results pending 
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
## 2019-11-04 NOTE — Procedures (Signed)
Patient Name: Monica Stephens  MRN: [id number]  Epilepsy Attending: Lora Havens  Referring Physician/Provider: Francine Graven, NP Date: 11/27/2019 Duration: 29.41 mins  Patient history: 38yo F s/p cardiac arrest on cooling protocol. EEG to evaluate for seizure.   Level of alertness: comatose  AEDs during EEG study: versed  Technical aspects: This EEG study was done with scalp electrodes positioned according to the 10-20 International system of electrode placement. Electrical activity was acquired at a sampling rate of 500Hz  and reviewed with a high frequency filter of 70Hz  and a low frequency filter of 1Hz . EEG data were recorded continuously and digitally stored.   DESCRIPTION: EEG showed continuous generalized background suppression. EEG was not reactive to tactile stimulation. Hyperventilation and photic stimulation were not performed.  ABNORMALITY - Background suppression, generalized  IMPRESSION: This study is suggestive of profound diffuse encephalopathy, non specific to etiology. No seizures or epileptiform discharges were seen throughout the recording.
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
## 2019-11-04 NOTE — Progress Notes (Signed)
Francine Graven NP aware of ABG results ~1500. Orders to up PEEP to 12. Also aware of critical K of 2.7, currently putting in orders.

## 2019-11-04 NOTE — ED Notes (Signed)
No issues with the foley insertion site.

## 2019-11-04 NOTE — ED Notes (Signed)
Date and time results received: 11/05/2019  (use smartphrase ".now" to insert current time)  Test: Troponin Critical Value: 314  Name of Provider Notified: Hassan Rowan RN notified [facility]

## 2019-11-04 NOTE — Progress Notes (Signed)
Pharmacy Antibiotic Note  Monica Stephens is a 38 y.o. female admitted on 11/14/2019 as a CPR in progress.  Pharmacy has been consulted for unasyn dosing for possible aspiration pneumonia. Pt with elevated WBC. SCr is also above baseline.   Plan: Unasyn 3g IV Q8H F/u renal fxn, C&S, clinical status  Height: 5\' 3"  (160 cm) Weight: 100 lb (45.4 kg)(estimate) IBW/kg (Calculated) : 52.4  No data recorded.  Recent Labs  Lab 11/02/2019 1000  WBC 21.9*  CREATININE 1.47*    Estimated Creatinine Clearance: 37.9 mL/min (A) (by C-G formula based on SCr of 1.47 mg/dL (H)).    No Known Allergies  Antimicrobials this admission: Unasyn 11/4>>  Dose adjustments this admission: N/A  Microbiology results: Pending  Thank you for allowing pharmacy to be a part of this patient's care.  Ammie Warrick, Rande Lawman 11/13/2019 11:18 AM

## 2019-11-04 NOTE — ED Notes (Signed)
Received call from 2 heart inquiring about Code Cool, CCM consulted, initiate code cool, bear hugger removed, ice bags placed.

## 2019-11-04 NOTE — ED Notes (Signed)
Family and chaplin at bedside

## 2019-11-04 NOTE — Code Documentation (Signed)
OG placed ?

## 2019-11-04 NOTE — Progress Notes (Signed)
Patient transported to CT and back to trauma room C without complications.

## 2019-11-04 NOTE — ED Triage Notes (Signed)
Pt to ED via EMS for active CPR, per EMS pt found unresponsive, unknown down time. No pulse on EMS arrival, Epi x 16 given by EMS. King airway placed, #18 left hand , epi drip started 1mg /250 ml, aprox 250 ml blood from airway.

## 2019-11-04 NOTE — Procedures (Signed)
Intubation Procedure Note Monica Stephens [id number] May 23, 1981  Procedure: Intubation Indications: Airway protection and maintenance  Procedure Details Consent: Risks of procedure as well as the alternatives and risks of each were explained to the (patient/caregiver).  Consent for procedure obtained. and Unable to obtain consent because of emergent medical necessity. Time Out: Verified patient identification, verified procedure, site/side was marked, verified correct patient position, special equipment/implants available, medications/allergies/relevent history reviewed, required imaging and test results available.  Performed  Maximum sterile technique was used including antiseptics, cap, gloves, gown, hand hygiene, mask and sheet.  MAC and 3    Evaluation Hemodynamic Status: Transient hypotension treated with pressors; O2 sats: stable throughout Patient's Current Condition: stable Complications: No apparent complications Patient did tolerate procedure well. Chest X-ray ordered to verify placement.  CXR: tube position acceptable.  Patient intubated by ED MD.  Positive color change noted.  Bilateral breath sounds auscultated.  Chest xray obtained.  Difficult airway due to large tongue.  Will continue to monitor,.    Judith Part 11/21/2019

## 2019-11-04 NOTE — Code Documentation (Signed)
Pulse check- pulse noted.

## 2019-11-04 NOTE — Progress Notes (Addendum)
Pt referred to CDS.  Coordinator will be contacting bedside RN shortly.  Referral # [id number]. (back up center took call). Info also noted in post-mortum flowsheet.   CDS contacted myself a second time to gather more info on pt.  Bedside RN unable to take call dt pt care.  All questions answered.  Spoke with [place].  Updated referral # for CDS is [id number].  Doc flowsheet updated with this info.

## 2019-11-04 NOTE — Procedures (Signed)
Ultrasound guided Central Venous Catheter Insertion Procedure Note Monica Stephens [id number] 1981/07/11  Procedure: Insertion of Central Venous Catheter Indications: Drug and/or fluid administration  Procedure Details Consent: Risks of procedure as well as the alternatives and risks of each were explained to the (patient/caregiver).  Consent for procedure obtained. Time Out: Verified patient identification, verified procedure, site/side was marked, verified correct patient position, special equipment/implants available, medications/allergies/relevent history reviewed, required imaging and test results available.  Performed  Maximum sterile technique was used including antiseptics, cap, gloves, gown, hand hygiene, mask and sheet. Skin prep: Chlorhexidine; local anesthetic administered Under direct ultrasound guidance the right internal jugular vein was visualized. A antimicrobial bonded/coated triple lumen catheter was placed in the right internal jugular vein using the Seldinger technique.  Evaluation Blood flow good Complications: No apparent complications Patient did tolerate procedure well. Chest X-ray ordered to verify placement.  CXR: normal.  Alfonzo Feller 11/09/2019, 1:30 PM

## 2019-11-04 NOTE — Code Documentation (Signed)
Dr Rex Kras noted heart activity on [place]

## 2019-11-04 NOTE — Progress Notes (Signed)
NP Rhonda aware of 1700 ABG and L.A 7.6. Conitinue current plan of care. Replace calcium.

## 2019-11-04 NOTE — Progress Notes (Signed)
  Echocardiogram 2D Echocardiogram has been performed.  Monica Stephens 11/17/2019, 11:48 AM

## 2019-11-04 NOTE — ED Notes (Signed)
Pt's temp foley temp is 86.6. Informed Dr. Rex Kras. Bair hugger placed on pt, per Dr. Rex Kras.

## 2019-11-04 NOTE — Procedures (Signed)
Arterial Catheter Insertion Procedure Note Monica Stephens [id number] 10/29/1981  Procedure: Insertion of Arterial Catheter  Indications: Blood pressure monitoring and Frequent blood sampling  Procedure Details Consent: Risks of procedure as well as the alternatives and risks of each were explained to the (patient/caregiver).  Consent for procedure obtained. Time Out: Verified patient identification, verified procedure, site/side was marked, verified correct patient position, special equipment/implants available, medications/allergies/relevent history reviewed, required imaging and test results available.  Performed  Maximum sterile technique was used including antiseptics, cap, gloves, gown, hand hygiene, mask and sheet. Skin prep: Chlorhexidine; local anesthetic administered 20 gauge catheter was inserted into right radial artery using the Seldinger technique. ULTRASOUND GUIDANCE USED: NO Evaluation Blood flow good; BP tracing good. Complications: No apparent complications.   Judith Part 11/20/2019

## 2019-11-04 NOTE — Progress Notes (Signed)
Chaplain provided spiritual presence and support to Ms. Hohn's mother and father. Chaplain worked to let parents know that they had support and to offer comfort.  Chaplain will follow-up if needed.

## 2019-11-04 NOTE — ED Notes (Signed)
ICU provider at bedside

## 2019-11-04 NOTE — Progress Notes (Signed)
Post intubation ABG obtained.  Obtained on ventilator settings of VT of 410, RR of 18, FIO2 of 100%, and PEEP of 5.  Increased RR to 20 and PEEP to 10.  Per MD, allow sats to be >90%.  Will continue to monitor.    Ref. Range 11/14/2019 11:15  Sample type Unknown ARTERIAL  pH, Arterial Latest Ref Range: 7.350 - 7.450  7.026 (LL)  pCO2 arterial Latest Ref Range: 32.0 - 48.0 mmHg 52.0 (H)  pO2, Arterial Latest Ref Range: 83.0 - 108.0 mmHg 65.0 (L)  TCO2 Latest Ref Range: 22 - 32 mmol/L 17 (L)  Acid-base deficit Latest Ref Range: 0.0 - 2.0 mmol/L 17.0 (H)  Bicarbonate Latest Ref Range: 20.0 - 28.0 mmol/L 15.2 (L)  O2 Saturation Latest Units: % 91.0  Patient temperature Unknown 86.5 F  Collection site Unknown RADIAL, ALLEN'S TEST ACCEPTABLE

## 2019-11-05 DIAGNOSIS — G931 Anoxic brain damage, not elsewhere classified: Secondary | ICD-10-CM

## 2019-11-05 DIAGNOSIS — E440 Moderate protein-calorie malnutrition: Secondary | ICD-10-CM | POA: Insufficient documentation

## 2019-11-05 LAB — BASIC METABOLIC PANEL
Anion gap: 12 (ref 5–15)
Anion gap: 14 (ref 5–15)
BUN: 32 mg/dL — ABNORMAL HIGH (ref 6–20)
BUN: 37 mg/dL — ABNORMAL HIGH (ref 6–20)
CO2: 26 mmol/L (ref 22–32)
CO2: 28 mmol/L (ref 22–32)
Calcium: 7.2 mg/dL — ABNORMAL LOW (ref 8.9–10.3)
Calcium: 7.1 mg/dL — ABNORMAL LOW (ref 8.9–10.3)
Chloride: 107 mmol/L (ref 98–111)
Chloride: 102 mmol/L (ref 98–111)
Creatinine, Ser: 2.17 mg/dL — ABNORMAL HIGH (ref 0.44–1.00)
Creatinine, Ser: 2.91 mg/dL — ABNORMAL HIGH (ref 0.44–1.00)
GFR calc Af Amer: 33 mL/min — ABNORMAL LOW (ref >60)
GFR calc Af Amer: 23 mL/min — ABNORMAL LOW (ref >60)
GFR calc non Af Amer: 28 mL/min — ABNORMAL LOW (ref >60)
GFR calc non Af Amer: 20 mL/min — ABNORMAL LOW (ref >60)
Glucose, Bld: 99 mg/dL (ref 70–99)
Glucose, Bld: 121 mg/dL — ABNORMAL HIGH (ref 70–99)
Potassium: 3 mmol/L — ABNORMAL LOW (ref 3.5–5.1)
Potassium: 3.5 mmol/L (ref 3.5–5.1)
Sodium: 145 mmol/L (ref 135–145)
Sodium: 144 mmol/L (ref 135–145)

## 2019-11-05 LAB — HEPATIC FUNCTION PANEL
ALT: 152 U/L — ABNORMAL HIGH (ref 0–44)
AST: 261 U/L — ABNORMAL HIGH (ref 15–41)
Albumin: 2.1 g/dL — ABNORMAL LOW (ref 3.5–5.0)
Alkaline Phosphatase: 87 U/L (ref 38–126)
Bilirubin, Direct: <0.1 mg/dL (ref 0.0–0.2)
Total Bilirubin: 1 mg/dL (ref 0.3–1.2)
Total Protein: 5 g/dL — ABNORMAL LOW (ref 6.5–8.1)

## 2019-11-05 LAB — CBC
HCT: 33 % — ABNORMAL LOW (ref 36.0–46.0)
Hemoglobin: 10.5 g/dL — ABNORMAL LOW (ref 12.0–15.0)
MCH: 25.9 pg — ABNORMAL LOW (ref 26.0–34.0)
MCHC: 31.8 g/dL (ref 30.0–36.0)
MCV: 81.3 fL (ref 80.0–100.0)
Platelets: 118 10*3/uL — ABNORMAL LOW (ref 150–400)
RBC: 4.06 MIL/uL (ref 3.87–5.11)
RDW: 17.2 % — ABNORMAL HIGH (ref 11.5–15.5)
WBC: 5.2 10*3/uL (ref 4.0–10.5)
nRBC: 0 % (ref 0.0–0.2)

## 2019-11-05 LAB — LACTIC ACID, PLASMA: Lactic Acid, Venous: 2.5 mmol/L (ref 0.5–1.9)

## 2019-11-05 LAB — GLUCOSE, CAPILLARY
Glucose-Capillary: 96 mg/dL (ref 70–99)
Glucose-Capillary: 95 mg/dL (ref 70–99)
Glucose-Capillary: 113 mg/dL — ABNORMAL HIGH (ref 70–99)
Glucose-Capillary: 140 mg/dL — ABNORMAL HIGH (ref 70–99)
Glucose-Capillary: 125 mg/dL — ABNORMAL HIGH (ref 70–99)

## 2019-11-05 LAB — PHOSPHORUS
Phosphorus: 2.5 mg/dL (ref 2.5–4.6)
Phosphorus: 3.1 mg/dL (ref 2.5–4.6)

## 2019-11-05 LAB — MRSA PCR SCREENING: MRSA by PCR: POSITIVE — AB

## 2019-11-05 LAB — MAGNESIUM: Magnesium: 2.6 mg/dL — ABNORMAL HIGH (ref 1.7–2.4)

## 2019-11-05 MED ORDER — CHLORHEXIDINE GLUCONATE 0.12 % MT SOLN
OROMUCOSAL | Status: AC
  Filled 2019-11-05: qty 15

## 2019-11-05 MED ORDER — CHLORHEXIDINE GLUCONATE CLOTH 2 % EX PADS
6.0000 | MEDICATED_PAD | Freq: Every day | CUTANEOUS | Status: DC
  Administered 2019-11-05 – 2019-11-08 (×4): 6 via TOPICAL

## 2019-11-05 MED ORDER — MUPIROCIN 2 % EX OINT
1.0000 "application " | TOPICAL_OINTMENT | Freq: Two times a day (BID) | CUTANEOUS | Status: DC
  Administered 2019-11-05 – 2019-11-08 (×7): 1 via NASAL
  Filled 2019-11-05 (×2): qty 22

## 2019-11-05 MED ORDER — VITAL AF 1.2 CAL PO LIQD
1000.0000 mL | ORAL | Status: DC
  Administered 2019-11-06 (×2): 1000 mL

## 2019-11-05 MED ORDER — PRO-STAT SUGAR FREE PO LIQD: 30.0000 mL | Freq: Two times a day (BID) | ORAL | Status: DC

## 2019-11-05 MED ORDER — POTASSIUM CHLORIDE 20 MEQ/15ML (10%) PO SOLN
40.0000 meq | Freq: Once | ORAL | Status: AC
  Administered 2019-11-05: 04:00:00 40 meq
  Filled 2019-11-05: qty 30

## 2019-11-05 MED ORDER — FAMOTIDINE 40 MG/5ML PO SUSR
20.0000 mg | Freq: Every day | ORAL | Status: DC
  Administered 2019-11-05 – 2019-11-06 (×2): 20 mg
  Filled 2019-11-05 (×2): qty 2.5

## 2019-11-05 MED ORDER — SODIUM CHLORIDE 0.9 % IV SOLN
3.0000 g | Freq: Two times a day (BID) | INTRAVENOUS | Status: DC
  Administered 2019-11-06 – 2019-11-07 (×3): 3 g via INTRAVENOUS
  Filled 2019-11-05: qty 8
  Filled 2019-11-05: qty 3
  Filled 2019-11-05 (×3): qty 8

## 2019-11-05 MED ORDER — NOREPINEPHRINE 4 MG/250ML-% IV SOLN
0.0000 ug/min | INTRAVENOUS | Status: DC
  Administered 2019-11-05: 18:00:00 2 ug/min via INTRAVENOUS
  Administered 2019-11-06: 10:00:00 5 ug/min via INTRAVENOUS
  Administered 2019-11-06: 20:00:00 6 ug/min via INTRAVENOUS
  Filled 2019-11-05 (×3): qty 250

## 2019-11-05 MED FILL — Medication: Qty: 1 | Status: AC

## 2019-11-05 NOTE — Progress Notes (Signed)
Continued pastoral care to the family.  Escorted family to 2 [facility] Unit when Ms Cullin was moved.  Showed family where they could get something to eat in the hospital as the nursing staff was settling their daughter/sister into her hospital room.  Chaplains will continue to follow up with this patient and family.  De Burrs Chaplain Resident

## 2019-11-05 NOTE — Plan of Care (Signed)
  Problem: Clinical Measurements: Goal: Ability to maintain clinical measurements within normal limits will improve Outcome: Not Progressing Goal: Respiratory complications will improve Outcome: Not Progressing   Problem: Activity: Goal: Risk for activity intolerance will decrease Outcome: Not Progressing   Problem: Nutrition: Goal: Adequate nutrition will be maintained Outcome: Not Progressing   Problem: Elimination: Goal: Will not experience complications related to bowel motility Outcome: Progressing Goal: Will not experience complications related to urinary retention Outcome: Progressing   Problem: Pain Managment: Goal: General experience of comfort will improve Outcome: Progressing   Problem: Safety: Goal: Ability to remain free from injury will improve Outcome: Progressing   Problem: Skin Integrity: Goal: Risk for impaired skin integrity will decrease Outcome: Progressing   Problem: Cardiac: Goal: Ability to achieve and maintain adequate cardiopulmonary perfusion will improve Outcome: Progressing   Problem: Neurologic: Goal: Promote progressive neurologic recovery Outcome: Not Progressing   Problem: Skin Integrity: Goal: Risk for impaired skin integrity will be minimized. Outcome: Progressing
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
## 2019-11-05 NOTE — Progress Notes (Signed)
[facility] Progress Note Patient Name: Monica Stephens DOB: 1981/07/07 MRN: [id number]   Date of Service  11/05/2019  HPI/Events of Note  Multiple issues: 1. K+ = 3.0 and Creatinine = 2.17 and 2. Diarrhea - Request for a Flexiseal.   eICU Interventions  Will order: 1. Replace K+. 2. Place Flexiseal.      Intervention Category Major Interventions: Electrolyte abnormality - evaluation and management  Dalaney Needle Eugene 11/05/2019, 2:43 AM

## 2019-11-05 NOTE — Progress Notes (Signed)
Initial Nutrition Assessment  DOCUMENTATION CODES:   Non-severe (moderate) malnutrition in context of social or environmental circumstances, Underweight  INTERVENTION:   Tube feeding:  Vital AF 1.2 @ 40 ml/hr (960 ml) via OGT  Provides: 1152 kcals, 72 grams protein, 779 ml free water.   NUTRITION DIAGNOSIS:   Moderate Malnutrition related to social / environmental circumstances(drug abuse) as evidenced by moderate muscle depletion, mild fat depletion.  GOAL:   Provide needs based on ASPEN/SCCM guidelines  MONITOR:   Labs, Vent status, I & O's, Skin, TF tolerance, Weight trends  REASON FOR ASSESSMENT:   Ventilator    ASSESSMENT:   Patient with PMH significant for bipolar disorder, thyroid cancer s/p thyroidectomy, and polysubstance abuse. Presents this admission with cardiac arrest after suspected heroin overdose.   Pt discussed during ICU rounds and with RN.   On TTM36. Pressors being weaned. Pt with profound cerebral edema on CTH consistent with anoxic injury. Shows poor prognosis. Made DNR. Brain death exam after TTM complete.   Records indicate pt weighed 48.5 kg on 10/5 and show a stated weight of 45.4 kg this admission. Will utilize 45.5 kg as EDW.   Patient is currently intubated on ventilator support MV: 7.8 L/min Temp (24hrs), Avg:90.9 F (32.7 C), Min:82.8 F (28.2 C), Max:97.2 F (36.2 C)    I/O: +2,024 ml since admit UOP: 1,520 ml x 24 hrs  OGT: 400 ml x 24 hrs  Rectal tube: 100 ml x 24 hrs   Drips: vasopressin Medications: SS novolog Labs: K 3.0 (L) Mg 2.6 (H)   NUTRITION - FOCUSED PHYSICAL EXAM:    Most Recent Value  Orbital Region  Mild depletion  Upper Arm Region  Moderate depletion  Thoracic and Lumbar Region  Unable to assess  Buccal Region  Unable to assess  Temple Region  Moderate depletion  Clavicle Bone Region  Moderate depletion  Clavicle and Acromion Bone Region  Moderate depletion  Scapular Bone Region  Unable to assess   Dorsal Hand  No depletion  Patellar Region  Moderate depletion  Anterior Thigh Region  Moderate depletion  Posterior Calf Region  Moderate depletion  Edema (RD Assessment)  Moderate  Hair  Reviewed  Eyes  Reviewed  Mouth  Reviewed  Skin  Reviewed  Nails  Reviewed       Diet Order:   Diet Order    None      EDUCATION NEEDS:   Not appropriate for education at this time  Skin:  Skin Assessment: Reviewed RN Assessment  Last BM:  11/5  Height:   Ht Readings from Last 1 Encounters:  11/08/2019 5\' 3"  (1.6 m)    Weight:   Wt Readings from Last 1 Encounters:  11/14/2019 45.4 kg    Ideal Body Weight:  52.3 kg  BMI:  Body mass index is 17.71 kg/m.  Estimated Nutritional Needs:   Kcal:  1100 kcal  Protein:  70-85 grams  Fluid:  >/= 1.1 L/day   Mariana Single RD, LDN Clinical Nutrition Pager # - [phone]

## 2019-11-06 ENCOUNTER — Inpatient Hospital Stay (HOSPITAL_COMMUNITY): Payer: Medicaid Other

## 2019-11-06 LAB — POCT I-STAT 7, (LYTES, BLD GAS, ICA,H+H)
Acid-Base Excess: 3 mmol/L — ABNORMAL HIGH (ref 0.0–2.0)
Acid-Base Excess: 3 mmol/L — ABNORMAL HIGH (ref 0.0–2.0)
Acid-Base Excess: 4 mmol/L — ABNORMAL HIGH (ref 0.0–2.0)
Acid-Base Excess: 5 mmol/L — ABNORMAL HIGH (ref 0.0–2.0)
Bicarbonate: 28 mmol/L (ref 20.0–28.0)
Bicarbonate: 28.1 mmol/L — ABNORMAL HIGH (ref 20.0–28.0)
Bicarbonate: 28.6 mmol/L — ABNORMAL HIGH (ref 20.0–28.0)
Bicarbonate: 29.5 mmol/L — ABNORMAL HIGH (ref 20.0–28.0)
Bicarbonate: 29.9 mmol/L — ABNORMAL HIGH (ref 20.0–28.0)
Calcium, Ion: 1.05 mmol/L — ABNORMAL LOW (ref 1.15–1.40)
Calcium, Ion: 1.1 mmol/L — ABNORMAL LOW (ref 1.15–1.40)
Calcium, Ion: 1.12 mmol/L — ABNORMAL LOW (ref 1.15–1.40)
Calcium, Ion: 1.12 mmol/L — ABNORMAL LOW (ref 1.15–1.40)
Calcium, Ion: 1.12 mmol/L — ABNORMAL LOW (ref 1.15–1.40)
HCT: 22 % — ABNORMAL LOW (ref 36.0–46.0)
HCT: 23 % — ABNORMAL LOW (ref 36.0–46.0)
HCT: 23 % — ABNORMAL LOW (ref 36.0–46.0)
HCT: 24 % — ABNORMAL LOW (ref 36.0–46.0)
HCT: 26 % — ABNORMAL LOW (ref 36.0–46.0)
Hemoglobin: 7.5 g/dL — ABNORMAL LOW (ref 12.0–15.0)
Hemoglobin: 7.8 g/dL — ABNORMAL LOW (ref 12.0–15.0)
Hemoglobin: 7.8 g/dL — ABNORMAL LOW (ref 12.0–15.0)
Hemoglobin: 8.2 g/dL — ABNORMAL LOW (ref 12.0–15.0)
Hemoglobin: 8.8 g/dL — ABNORMAL LOW (ref 12.0–15.0)
O2 Saturation: 100 %
O2 Saturation: 88 %
O2 Saturation: 99 %
O2 Saturation: 99 %
O2 Saturation: 99 %
Patient temperature: 36.3
Patient temperature: 36.9
Patient temperature: 36.9
Patient temperature: 36.9
Patient temperature: 37.1
Potassium: 3 mmol/L — ABNORMAL LOW (ref 3.5–5.1)
Potassium: 3.1 mmol/L — ABNORMAL LOW (ref 3.5–5.1)
Potassium: 3.2 mmol/L — ABNORMAL LOW (ref 3.5–5.1)
Potassium: 3.5 mmol/L (ref 3.5–5.1)
Potassium: 3.7 mmol/L (ref 3.5–5.1)
Sodium: 150 mmol/L — ABNORMAL HIGH (ref 135–145)
Sodium: 151 mmol/L — ABNORMAL HIGH (ref 135–145)
Sodium: 153 mmol/L — ABNORMAL HIGH (ref 135–145)
Sodium: 154 mmol/L — ABNORMAL HIGH (ref 135–145)
Sodium: 154 mmol/L — ABNORMAL HIGH (ref 135–145)
TCO2: 29 mmol/L (ref 22–32)
TCO2: 30 mmol/L (ref 22–32)
TCO2: 31 mmol/L (ref 22–32)
TCO2: 31 mmol/L (ref 22–32)
TCO2: 32 mmol/L (ref 22–32)
pCO2 arterial: 32.2 mmHg (ref 32.0–48.0)
pCO2 arterial: 46.7 mmHg (ref 32.0–48.0)
pCO2 arterial: 47.3 mmHg (ref 32.0–48.0)
pCO2 arterial: 57.5 mmHg — ABNORMAL HIGH (ref 32.0–48.0)
pCO2 arterial: 74.7 mmHg (ref 32.0–48.0)
pH, Arterial: 7.19 — CL (ref 7.350–7.450)
pH, Arterial: 7.324 — ABNORMAL LOW (ref 7.350–7.450)
pH, Arterial: 7.387 (ref 7.350–7.450)
pH, Arterial: 7.399 (ref 7.350–7.450)
pH, Arterial: 7.548 — ABNORMAL HIGH (ref 7.350–7.450)
pO2, Arterial: 140 mmHg — ABNORMAL HIGH (ref 83.0–108.0)
pO2, Arterial: 144 mmHg — ABNORMAL HIGH (ref 83.0–108.0)
pO2, Arterial: 166 mmHg — ABNORMAL HIGH (ref 83.0–108.0)
pO2, Arterial: 221 mmHg — ABNORMAL HIGH (ref 83.0–108.0)
pO2, Arterial: 70 mmHg — ABNORMAL LOW (ref 83.0–108.0)

## 2019-11-06 LAB — CBC
HCT: 28.3 % — ABNORMAL LOW (ref 36.0–46.0)
Hemoglobin: 9.2 g/dL — ABNORMAL LOW (ref 12.0–15.0)
MCH: 25.9 pg — ABNORMAL LOW (ref 26.0–34.0)
MCHC: 32.5 g/dL (ref 30.0–36.0)
MCV: 79.7 fL — ABNORMAL LOW (ref 80.0–100.0)
Platelets: 107 10*3/uL — ABNORMAL LOW (ref 150–400)
RBC: 3.55 MIL/uL — ABNORMAL LOW (ref 3.87–5.11)
RDW: 18.5 % — ABNORMAL HIGH (ref 11.5–15.5)
WBC: 17.7 10*3/uL — ABNORMAL HIGH (ref 4.0–10.5)
nRBC: 0 % (ref 0.0–0.2)

## 2019-11-06 LAB — PHOSPHORUS
Phosphorus: 2.8 mg/dL (ref 2.5–4.6)
Phosphorus: 4 mg/dL (ref 2.5–4.6)

## 2019-11-06 LAB — CBC WITH DIFFERENTIAL/PLATELET
Abs Immature Granulocytes: 1.1 10*3/uL — ABNORMAL HIGH (ref 0.00–0.07)
Basophils Absolute: 0 10*3/uL (ref 0.0–0.1)
Basophils Relative: 0 %
Eosinophils Absolute: 0 10*3/uL (ref 0.0–0.5)
Eosinophils Relative: 0 %
HCT: 25.1 % — ABNORMAL LOW (ref 36.0–46.0)
Hemoglobin: 7.8 g/dL — ABNORMAL LOW (ref 12.0–15.0)
Immature Granulocytes: 9 %
Lymphocytes Relative: 7 %
Lymphs Abs: 0.9 10*3/uL (ref 0.7–4.0)
MCH: 26.3 pg (ref 26.0–34.0)
MCHC: 31.1 g/dL (ref 30.0–36.0)
MCV: 84.5 fL (ref 80.0–100.0)
Monocytes Absolute: 0.5 10*3/uL (ref 0.1–1.0)
Monocytes Relative: 4 %
Neutro Abs: 10.2 10*3/uL — ABNORMAL HIGH (ref 1.7–7.7)
Neutrophils Relative %: 80 %
Platelets: 52 10*3/uL — ABNORMAL LOW (ref 150–400)
RBC: 2.97 MIL/uL — ABNORMAL LOW (ref 3.87–5.11)
RDW: 19.6 % — ABNORMAL HIGH (ref 11.5–15.5)
WBC: 12.7 10*3/uL — ABNORMAL HIGH (ref 4.0–10.5)
nRBC: 0 % (ref 0.0–0.2)

## 2019-11-06 LAB — BASIC METABOLIC PANEL
Anion gap: 14 (ref 5–15)
BUN: 42 mg/dL — ABNORMAL HIGH (ref 6–20)
CO2: 27 mmol/L (ref 22–32)
Calcium: 7.2 mg/dL — ABNORMAL LOW (ref 8.9–10.3)
Chloride: 105 mmol/L (ref 98–111)
Creatinine, Ser: 3.34 mg/dL — ABNORMAL HIGH (ref 0.44–1.00)
GFR calc Af Amer: 20 mL/min — ABNORMAL LOW (ref >60)
GFR calc non Af Amer: 17 mL/min — ABNORMAL LOW (ref >60)
Glucose, Bld: 122 mg/dL — ABNORMAL HIGH (ref 70–99)
Potassium: 3.5 mmol/L (ref 3.5–5.1)
Sodium: 146 mmol/L — ABNORMAL HIGH (ref 135–145)

## 2019-11-06 LAB — COMPREHENSIVE METABOLIC PANEL
ALT: 94 U/L — ABNORMAL HIGH (ref 0–44)
AST: 62 U/L — ABNORMAL HIGH (ref 15–41)
Albumin: 2.1 g/dL — ABNORMAL LOW (ref 3.5–5.0)
Alkaline Phosphatase: 79 U/L (ref 38–126)
Anion gap: 9 (ref 5–15)
BUN: 50 mg/dL — ABNORMAL HIGH (ref 6–20)
CO2: 28 mmol/L (ref 22–32)
Calcium: 7.5 mg/dL — ABNORMAL LOW (ref 8.9–10.3)
Chloride: 116 mmol/L — ABNORMAL HIGH (ref 98–111)
Creatinine, Ser: 4.33 mg/dL — ABNORMAL HIGH (ref 0.44–1.00)
GFR calc Af Amer: 14 mL/min — ABNORMAL LOW (ref >60)
GFR calc non Af Amer: 12 mL/min — ABNORMAL LOW (ref >60)
Glucose, Bld: 140 mg/dL — ABNORMAL HIGH (ref 70–99)
Potassium: 3.8 mmol/L (ref 3.5–5.1)
Sodium: 153 mmol/L — ABNORMAL HIGH (ref 135–145)
Total Bilirubin: 0.8 mg/dL (ref 0.3–1.2)
Total Protein: 4.9 g/dL — ABNORMAL LOW (ref 6.5–8.1)

## 2019-11-06 LAB — GLUCOSE, CAPILLARY
Glucose-Capillary: 119 mg/dL — ABNORMAL HIGH (ref 70–99)
Glucose-Capillary: 120 mg/dL — ABNORMAL HIGH (ref 70–99)
Glucose-Capillary: 124 mg/dL — ABNORMAL HIGH (ref 70–99)
Glucose-Capillary: 102 mg/dL — ABNORMAL HIGH (ref 70–99)
Glucose-Capillary: 138 mg/dL — ABNORMAL HIGH (ref 70–99)
Glucose-Capillary: 116 mg/dL — ABNORMAL HIGH (ref 70–99)

## 2019-11-06 LAB — BILIRUBIN, DIRECT: Bilirubin, Direct: 0.1 mg/dL (ref 0.0–0.2)

## 2019-11-06 LAB — MAGNESIUM
Magnesium: 2.5 mg/dL — ABNORMAL HIGH (ref 1.7–2.4)
Magnesium: 2.6 mg/dL — ABNORMAL HIGH (ref 1.7–2.4)

## 2019-11-06 IMAGING — CT CT ABD-PELV W/O CM
2 of 4 series · 13 of 46 positions shown, 15 images · non-contrast
Comparison: CTA chest [DATE], renal ultrasound [DATE]

CLINICAL DATA: Potential heart donor evaluation

EXAM:
CT CHEST, ABDOMEN AND PELVIS WITHOUT CONTRAST
TECHNIQUE: Multidetector CT imaging of the chest, abdomen and pelvis was
performed following the standard protocol without IV contrast.

[Series 3: cap without · axial · non-contrast · 0.76mm/px · z∈[+1017,+1572]mm · 10 of 129 slices shown, 12 images]
[im 9/129  soft-tissue]
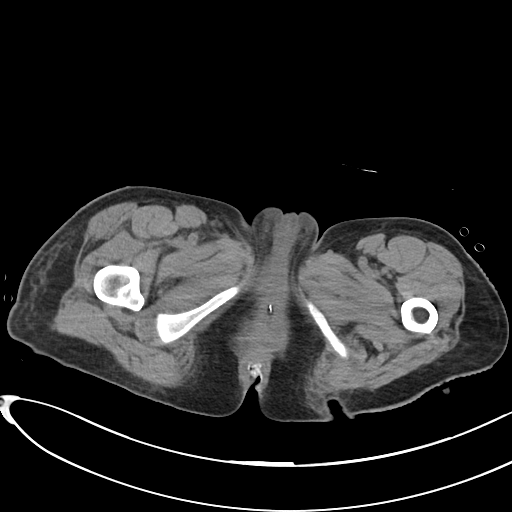
[im 9/129  bone]
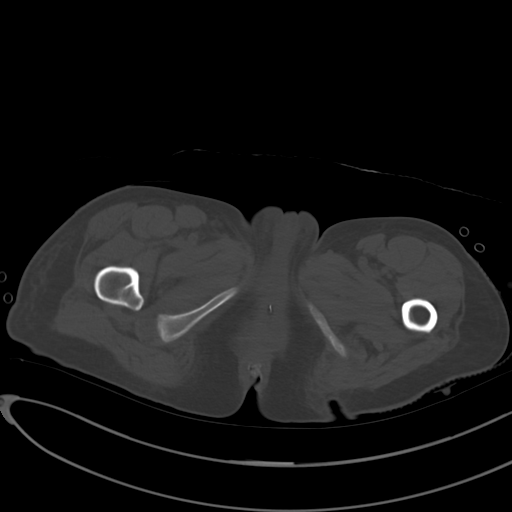
[im 26/129  soft-tissue]
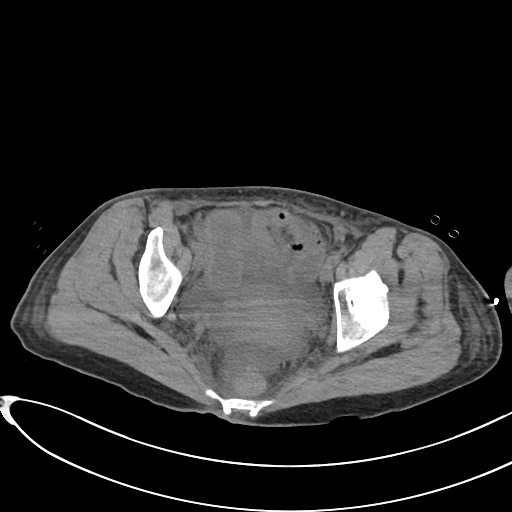
[im 35/129  soft-tissue]
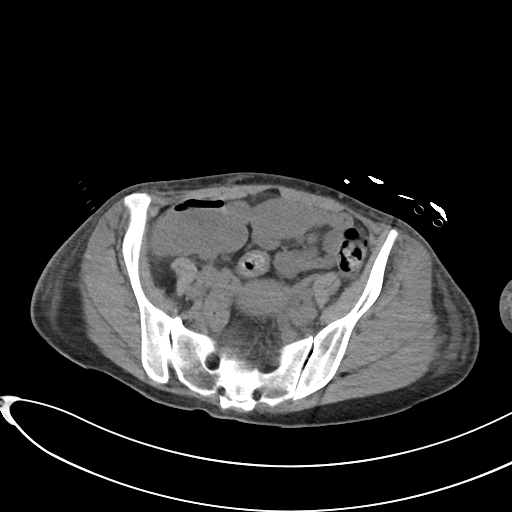
[im 43/129  soft-tissue]
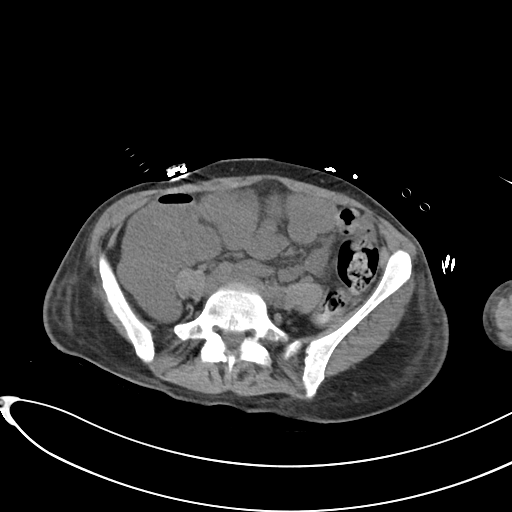
[im 60/129  soft-tissue]
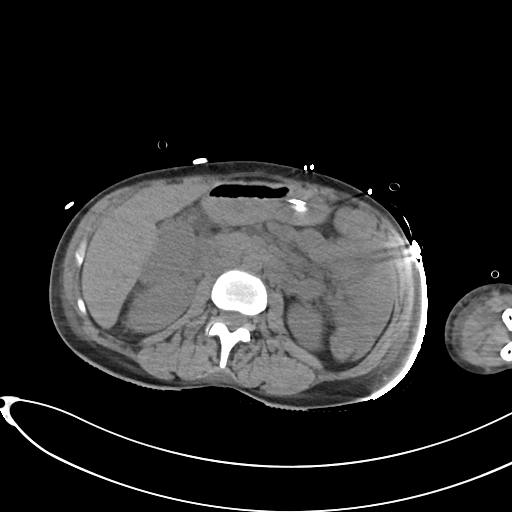
[im 69/129  soft-tissue]
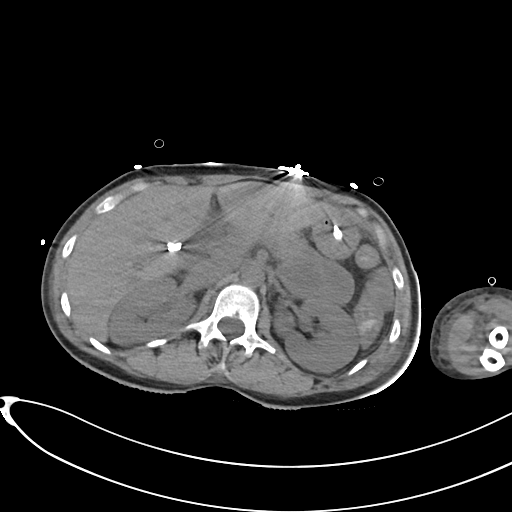
[im 86/129  soft-tissue]
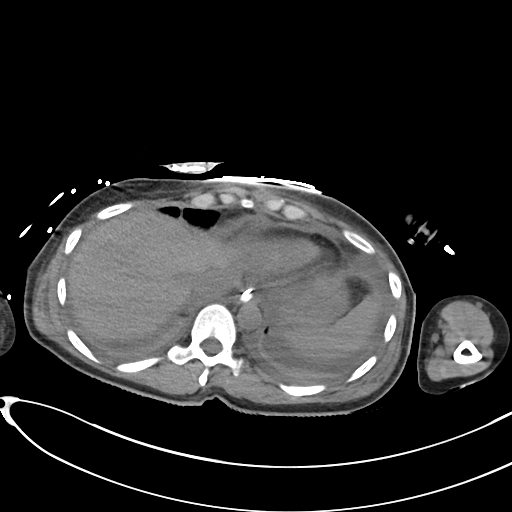
[im 94/129  soft-tissue]
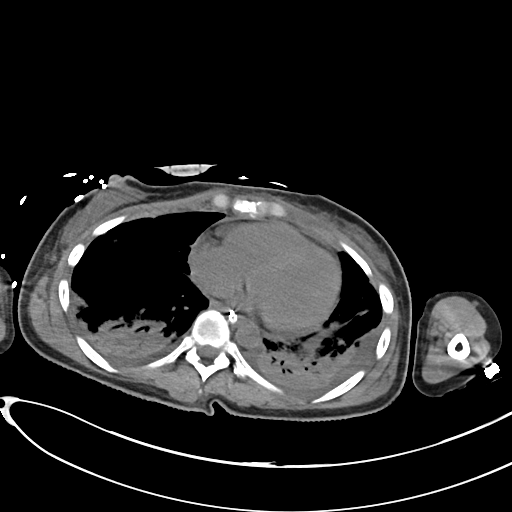
[im 103/129  soft-tissue]
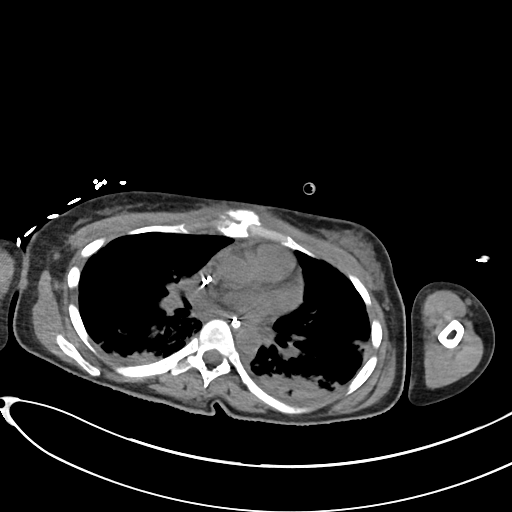
[im 103/129  bone]
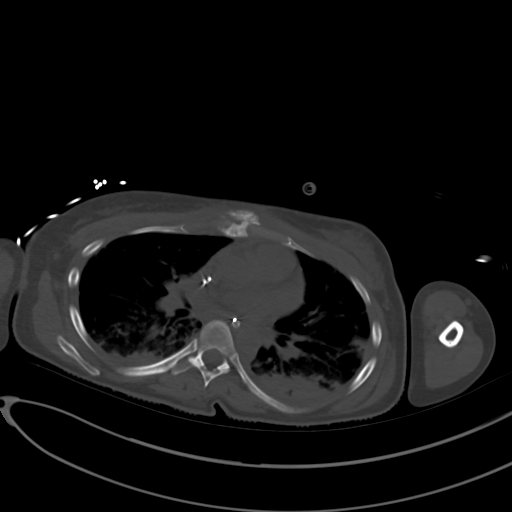
[im 120/129  soft-tissue]
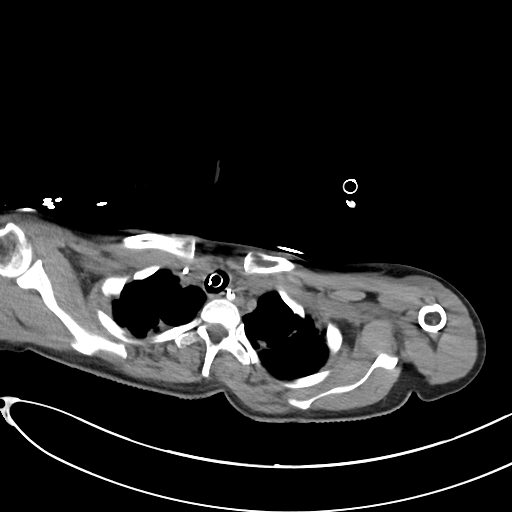

[Series 6: cor · coronal · 0.78mm/px · 3 of 86 slices shown]
[im 29/86  soft-tissue]
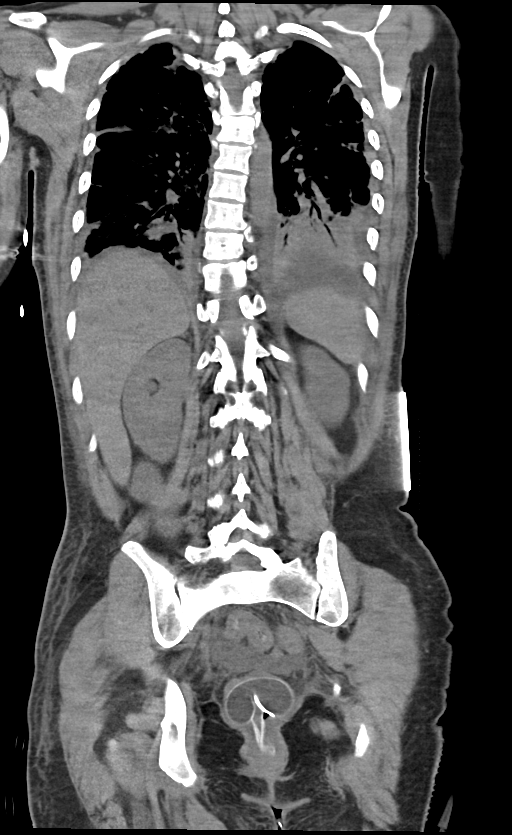
[im 38/86  soft-tissue]
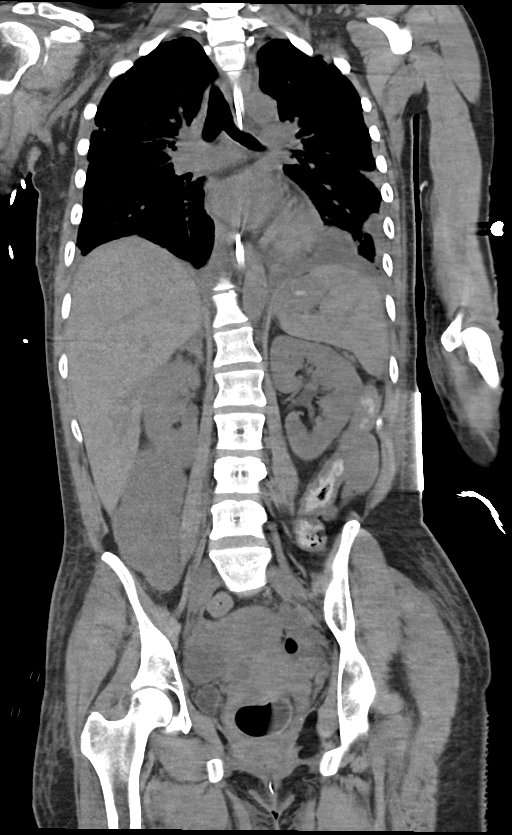
[im 48/86  soft-tissue]
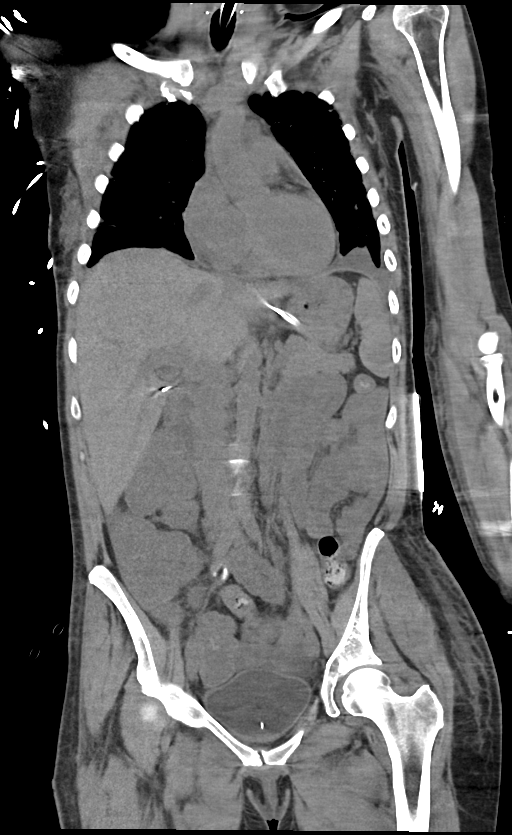

[13 of 46 positions shown; findings below may reference images not displayed]

FINDINGS: CT CHEST FINDINGS

Cardiovascular: Hypoattenuation of the cardiac blood pool relative
to the myocardium is suggestive of anemia. Normal cardiac size.
Small pericardial effusion. The aortic root is suboptimally assessed
given cardiac pulsation artifact. Thoracic aorta is normal caliber.
No abnormal mural thickening to suggest and intramural hematoma.
Central pulmonary arteries are normal caliber. Vascular assessment
is limited in the absence of contrast media. Central pulmonary
arteries are normal caliber.

Mediastinum/Nodes: Small amount of increased attenuation in the
mediastinum may reflect some edema. No frank hyperdense mediastinal
hematoma or pneumomediastinum. Endotracheal tube terminates in the
mid trachea, approximately 2.6 cm from the carina. Transesophageal
tube tip terminates in the gastric body directed towards the antrum.
No acute abnormality of the trachea or esophagus. No mediastinal or
axillary adenopathy. The hilar lymph nodes are poorly evaluated in
the absence of contrast media.

Lungs/Pleura: There are patchy multifocal areas of airspace opacity
throughout the lungs with interspersed areas of ground-glass
opacity. More focal volume loss and likely interspersed areas of
consolidation are present in the lung bases with small bilateral
pleural effusions.

Musculoskeletal: There is a chronic nonunion fracture of the mid
sternum with associated sclerotic and lytic changes of the bone
likely reflecting sequela of prior osteomyelitis. No acute fracture
or osseous abnormality is seen. Dextrocurvature of the lower
thoracic spine is noted apex at T9,.

CT ABDOMEN PELVIS FINDINGS

Hepatobiliary: No focal liver abnormality is seen. Patient is post
cholecystectomy. Slight prominence of the biliary tree likely
related to reservoir effect. No calcified intraductal gallstones.

Pancreas: Unremarkable. No pancreatic ductal dilatation or
surrounding inflammatory changes.

Spleen: Normal in size without focal abnormality.

Adrenals/Urinary Tract: Normal adrenal glands. Punctate
nonobstructing calculus in the upper pole right kidney. No
obstructive urolithiasis or hydronephrosis. No visible or contour
deforming renal lesions. Small amount of gas within the urinary
bladder likely related to the presence of a Foley catheter.

Stomach/Bowel: Distal esophagus, stomach and duodenal sweep are
unremarkable. Diffusely fluid-filled loops of small bowel. No small
bowel wall thickening or dilatation. Proximal colon is fluid-filled
as well. More high attenuation inspissated material is seen in the
distal colon. Fecal management system is noted at the level of the
rectum. Appendix is not well seen. No pericecal inflammation. No
evidence of obstruction.

Vascular/Lymphatic: The aorta is normal caliber. Difficult to
visualize the lymph nodes in the abdomen in the absence of contrast
media and paucity of intraperitoneal fat.

Reproductive: Normal appearance of the slightly retroflexed uterus
and adnexal structures.

Other: There is increased attenuation of the mesentery with small
volume free fluid in the deep pelvis. Circumferential body wall
edema is noted as well as soft tissue edema the medial forearm and
antecubital fossa on the left.

Musculoskeletal: No acute osseous abnormality or suspicious osseous
lesion.
IMPRESSION: 1. Numerous subpleural nodular airspace opacities throughout the
lungs a distribution similar to comparison studies and concerning
for acute or chronic septic emboli. More consolidated lung and
patchy multifocal areas of ground-glass opacity concerning for a
superimposed multifocal pneumonia at this time.
2. Increased attenuation of the mesentery with pericardial and
pleural effusions, small volume ascites and diffuse body wall edema,
consistent with anasarca.
3. Chronic nonunion fracture of the mid sternum with associated
sclerotic and lytic changes of the bone likely reflecting sequela of
prior osteomyelitis.
4. Diffusely fluid-filled loops of large and small bowel, correlate
with symptoms of an enterocolitis. Fecal management system at the
level of the rectum.
5. Nonobstructive right nephrolithiasis.

## 2019-11-06 IMAGING — CT CT CHEST W/O CM
2 of 4 series · 13 of 46 positions shown, 15 images · non-contrast
Comparison: CTA chest [DATE], renal ultrasound [DATE]

CLINICAL DATA: Potential heart donor evaluation

EXAM:
CT CHEST, ABDOMEN AND PELVIS WITHOUT CONTRAST
TECHNIQUE: Multidetector CT imaging of the chest, abdomen and pelvis was
performed following the standard protocol without IV contrast.

[Series 3: cap without · axial · non-contrast · 0.76mm/px · z∈[+1017,+1572]mm · 10 of 129 slices shown, 12 images]
[im 9/129  soft-tissue]
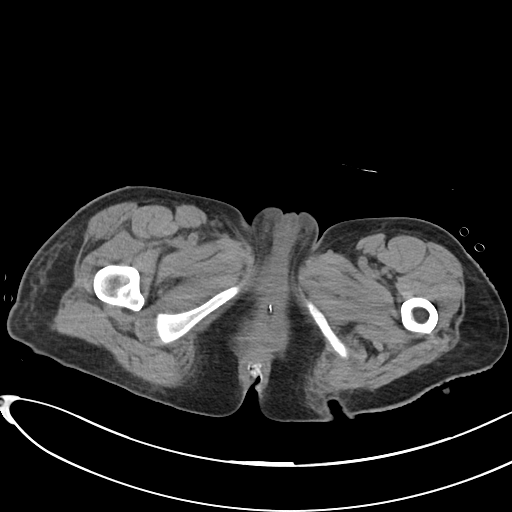
[im 9/129  bone]
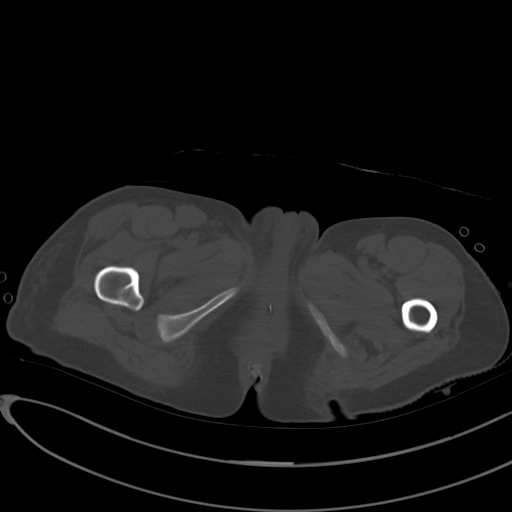
[im 26/129  soft-tissue]
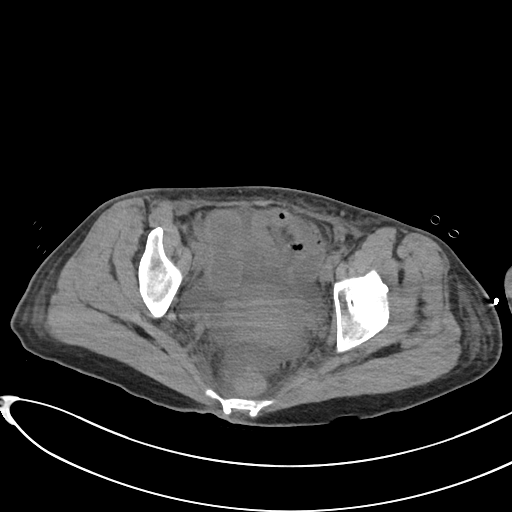
[im 35/129  soft-tissue]
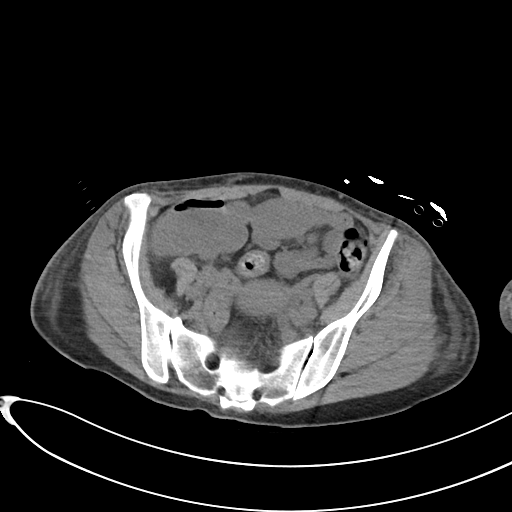
[im 43/129  soft-tissue]
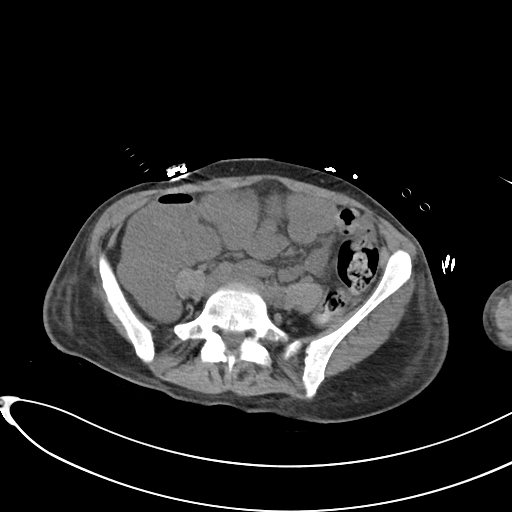
[im 60/129  soft-tissue]
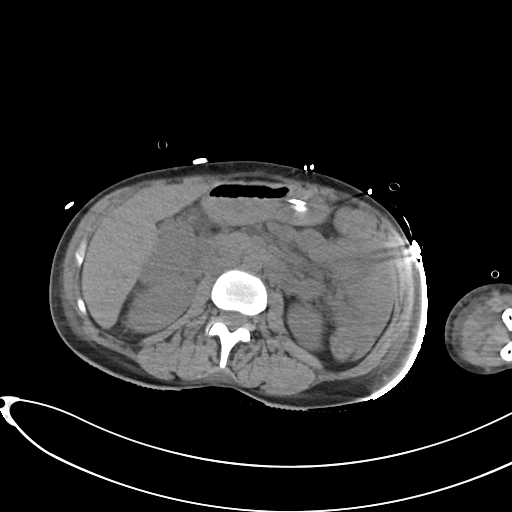
[im 69/129  soft-tissue]
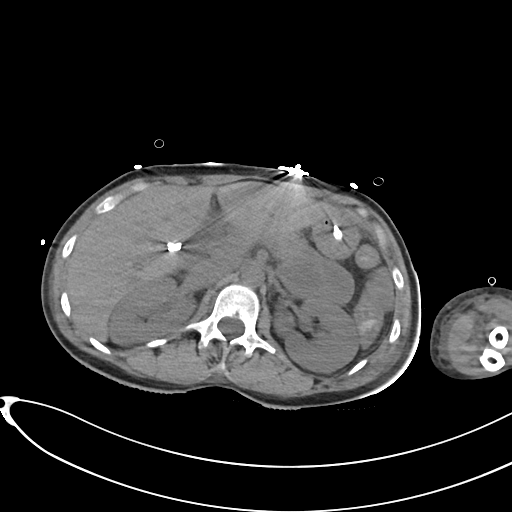
[im 86/129  soft-tissue]
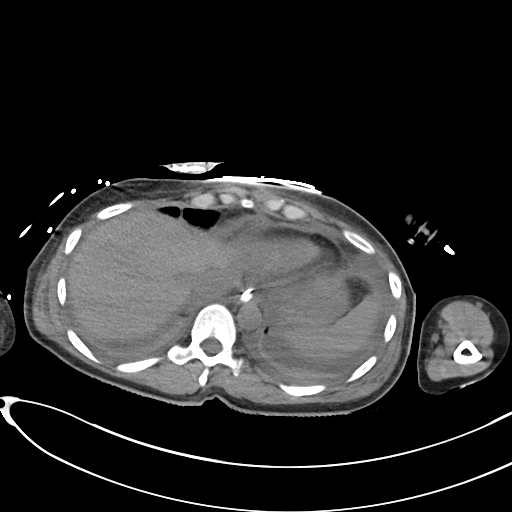
[im 94/129  soft-tissue]
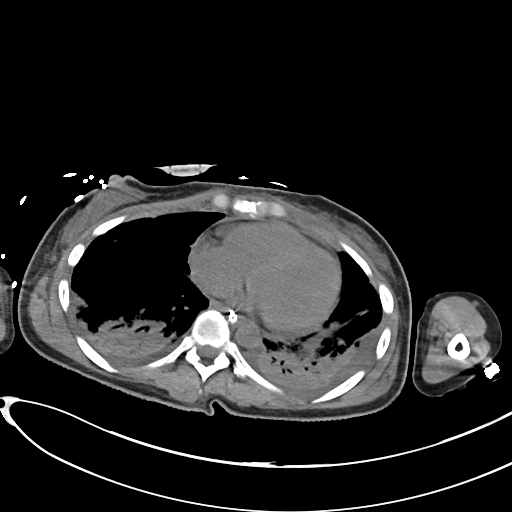
[im 103/129  soft-tissue]
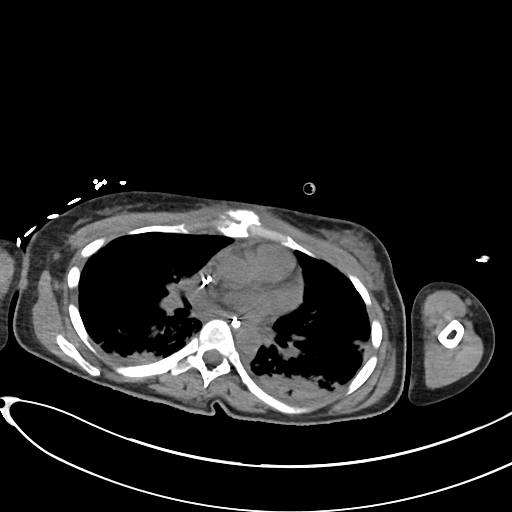
[im 103/129  bone]
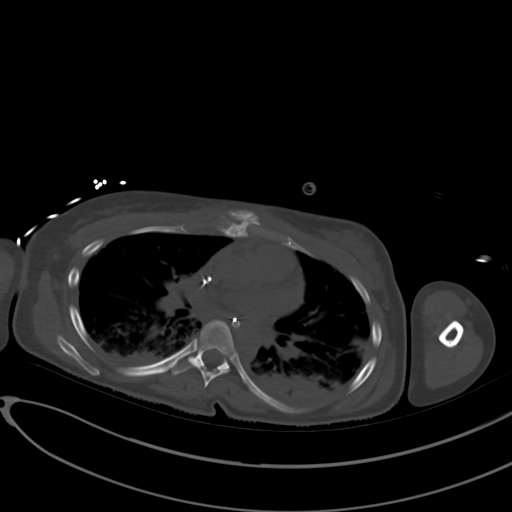
[im 120/129  soft-tissue]
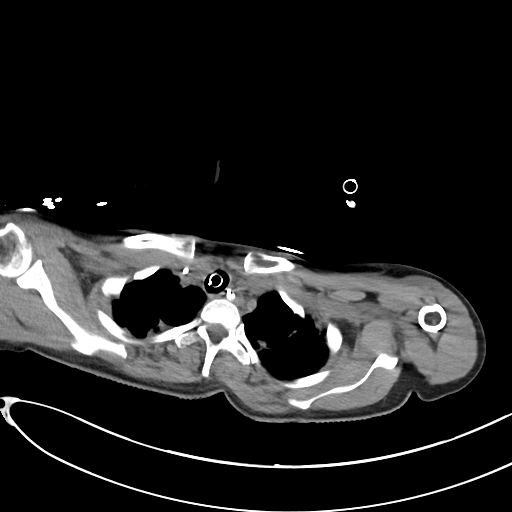

[Series 6: cor · coronal · 0.78mm/px · 3 of 86 slices shown]
[im 29/86  soft-tissue]
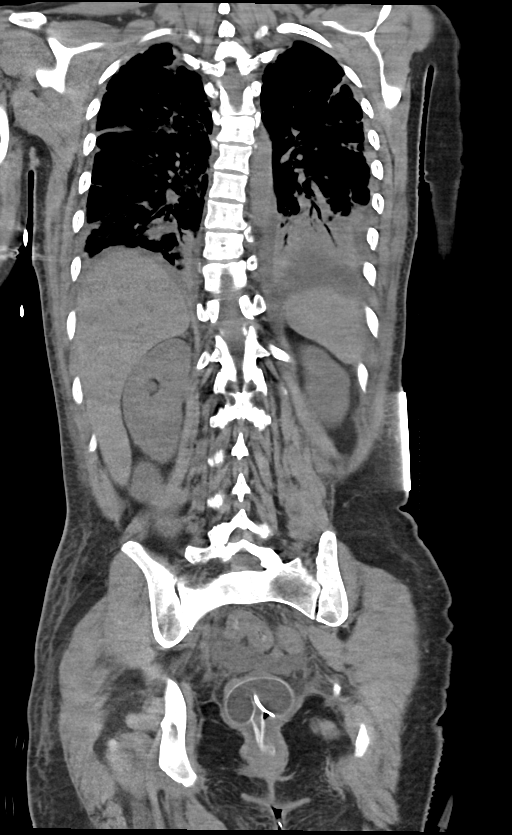
[im 38/86  soft-tissue]
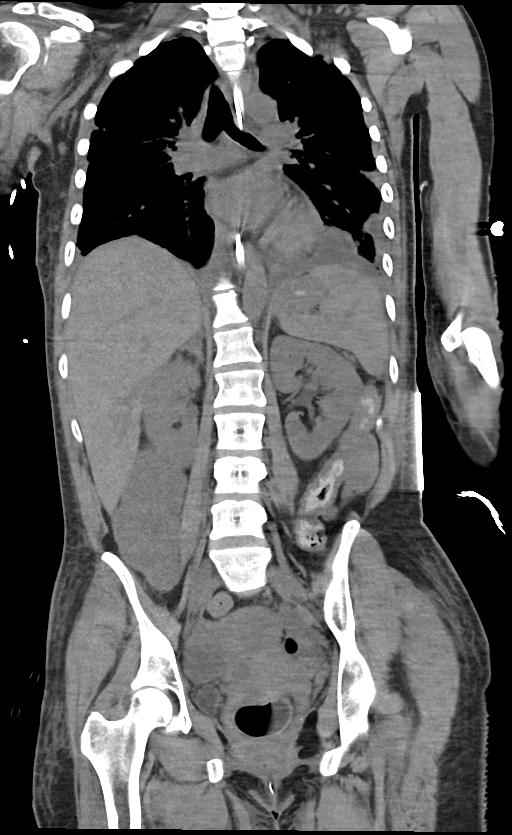
[im 48/86  soft-tissue]
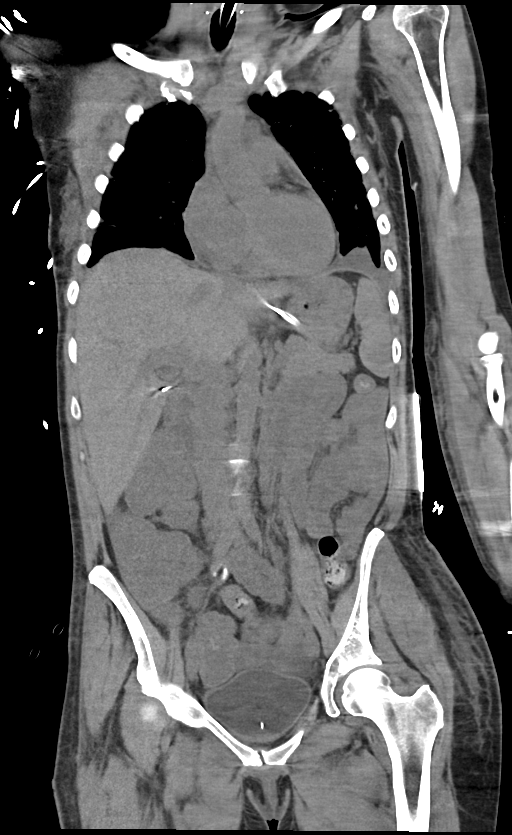

[13 of 46 positions shown; findings below may reference images not displayed]

FINDINGS: CT CHEST FINDINGS

Cardiovascular: Hypoattenuation of the cardiac blood pool relative
to the myocardium is suggestive of anemia. Normal cardiac size.
Small pericardial effusion. The aortic root is suboptimally assessed
given cardiac pulsation artifact. Thoracic aorta is normal caliber.
No abnormal mural thickening to suggest and intramural hematoma.
Central pulmonary arteries are normal caliber. Vascular assessment
is limited in the absence of contrast media. Central pulmonary
arteries are normal caliber.

Mediastinum/Nodes: Small amount of increased attenuation in the
mediastinum may reflect some edema. No frank hyperdense mediastinal
hematoma or pneumomediastinum. Endotracheal tube terminates in the
mid trachea, approximately 2.6 cm from the carina. Transesophageal
tube tip terminates in the gastric body directed towards the antrum.
No acute abnormality of the trachea or esophagus. No mediastinal or
axillary adenopathy. The hilar lymph nodes are poorly evaluated in
the absence of contrast media.

Lungs/Pleura: There are patchy multifocal areas of airspace opacity
throughout the lungs with interspersed areas of ground-glass
opacity. More focal volume loss and likely interspersed areas of
consolidation are present in the lung bases with small bilateral
pleural effusions.

Musculoskeletal: There is a chronic nonunion fracture of the mid
sternum with associated sclerotic and lytic changes of the bone
likely reflecting sequela of prior osteomyelitis. No acute fracture
or osseous abnormality is seen. Dextrocurvature of the lower
thoracic spine is noted apex at T9,.

CT ABDOMEN PELVIS FINDINGS

Hepatobiliary: No focal liver abnormality is seen. Patient is post
cholecystectomy. Slight prominence of the biliary tree likely
related to reservoir effect. No calcified intraductal gallstones.

Pancreas: Unremarkable. No pancreatic ductal dilatation or
surrounding inflammatory changes.

Spleen: Normal in size without focal abnormality.

Adrenals/Urinary Tract: Normal adrenal glands. Punctate
nonobstructing calculus in the upper pole right kidney. No
obstructive urolithiasis or hydronephrosis. No visible or contour
deforming renal lesions. Small amount of gas within the urinary
bladder likely related to the presence of a Foley catheter.

Stomach/Bowel: Distal esophagus, stomach and duodenal sweep are
unremarkable. Diffusely fluid-filled loops of small bowel. No small
bowel wall thickening or dilatation. Proximal colon is fluid-filled
as well. More high attenuation inspissated material is seen in the
distal colon. Fecal management system is noted at the level of the
rectum. Appendix is not well seen. No pericecal inflammation. No
evidence of obstruction.

Vascular/Lymphatic: The aorta is normal caliber. Difficult to
visualize the lymph nodes in the abdomen in the absence of contrast
media and paucity of intraperitoneal fat.

Reproductive: Normal appearance of the slightly retroflexed uterus
and adnexal structures.

Other: There is increased attenuation of the mesentery with small
volume free fluid in the deep pelvis. Circumferential body wall
edema is noted as well as soft tissue edema the medial forearm and
antecubital fossa on the left.

Musculoskeletal: No acute osseous abnormality or suspicious osseous
lesion.
IMPRESSION: 1. Numerous subpleural nodular airspace opacities throughout the
lungs a distribution similar to comparison studies and concerning
for acute or chronic septic emboli. More consolidated lung and
patchy multifocal areas of ground-glass opacity concerning for a
superimposed multifocal pneumonia at this time.
2. Increased attenuation of the mesentery with pericardial and
pleural effusions, small volume ascites and diffuse body wall edema,
consistent with anasarca.
3. Chronic nonunion fracture of the mid sternum with associated
sclerotic and lytic changes of the bone likely reflecting sequela of
prior osteomyelitis.
4. Diffusely fluid-filled loops of large and small bowel, correlate
with symptoms of an enterocolitis. Fecal management system at the
level of the rectum.
5. Nonobstructive right nephrolithiasis.

## 2019-11-06 MED ORDER — PANTOPRAZOLE SODIUM 40 MG PO PACK
40.0000 mg | PACK | Freq: Every day | ORAL | Status: DC
  Administered 2019-11-06 – 2019-11-07 (×2): 40 mg
  Filled 2019-11-06 (×2): qty 20

## 2019-11-06 MED ORDER — DEXTROSE-NACL 5-0.45 % IV SOLN
INTRAVENOUS | Status: DC
  Administered 2019-11-06 – 2019-11-07 (×2): via INTRAVENOUS

## 2019-11-06 MED ORDER — POLYVINYL ALCOHOL 1.4 % OP SOLN
1.0000 [drp] | OPHTHALMIC | Status: DC | PRN
  Administered 2019-11-07: 09:00:00 1 [drp] via OPHTHALMIC
  Filled 2019-11-06: qty 15

## 2019-11-06 MED ORDER — CALCIUM GLUCONATE-NACL 1-0.675 GM/50ML-% IV SOLN
1.0000 g | Freq: Once | INTRAVENOUS | Status: AC
  Administered 2019-11-06: 1000 mg via INTRAVENOUS
  Filled 2019-11-06: qty 50

## 2019-11-06 MED ORDER — LACTATED RINGERS IV SOLN
INTRAVENOUS | Status: DC
  Administered 2019-11-06 (×2): via INTRAVENOUS

## 2019-11-07 ENCOUNTER — Other Ambulatory Visit (HOSPITAL_COMMUNITY): Payer: Medicaid Other

## 2019-11-07 DIAGNOSIS — Z529 Donor of unspecified organ or tissue: Secondary | ICD-10-CM

## 2019-11-07 LAB — BASIC METABOLIC PANEL
Anion gap: 10 (ref 5–15)
BUN: 53 mg/dL — ABNORMAL HIGH (ref 6–20)
CO2: 24 mmol/L (ref 22–32)
Calcium: 7.5 mg/dL — ABNORMAL LOW (ref 8.9–10.3)
Chloride: 120 mmol/L — ABNORMAL HIGH (ref 98–111)
Creatinine, Ser: 3.97 mg/dL — ABNORMAL HIGH (ref 0.44–1.00)
GFR calc Af Amer: 16 mL/min — ABNORMAL LOW (ref >60)
GFR calc non Af Amer: 14 mL/min — ABNORMAL LOW (ref >60)
Glucose, Bld: 173 mg/dL — ABNORMAL HIGH (ref 70–99)
Potassium: 3.4 mmol/L — ABNORMAL LOW (ref 3.5–5.1)
Sodium: 154 mmol/L — ABNORMAL HIGH (ref 135–145)

## 2019-11-07 LAB — BILIRUBIN, DIRECT: Bilirubin, Direct: <0.1 mg/dL (ref 0.0–0.2)

## 2019-11-07 LAB — DIFFERENTIAL
Abs Immature Granulocytes: 0.09 10*3/uL — ABNORMAL HIGH (ref 0.00–0.07)
Basophils Absolute: 0 10*3/uL (ref 0.0–0.1)
Basophils Relative: 0 %
Eosinophils Absolute: 0 10*3/uL (ref 0.0–0.5)
Eosinophils Relative: 0 %
Immature Granulocytes: 1 %
Lymphocytes Relative: 8 %
Lymphs Abs: 0.8 10*3/uL (ref 0.7–4.0)
Monocytes Absolute: 0.3 10*3/uL (ref 0.1–1.0)
Monocytes Relative: 3 %
Neutro Abs: 8.8 10*3/uL — ABNORMAL HIGH (ref 1.7–7.7)
Neutrophils Relative %: 88 %

## 2019-11-07 LAB — MAGNESIUM
Magnesium: 2.5 mg/dL — ABNORMAL HIGH (ref 1.7–2.4)
Magnesium: 2.5 mg/dL — ABNORMAL HIGH (ref 1.7–2.4)

## 2019-11-07 LAB — LACTIC ACID, PLASMA: Lactic Acid, Venous: 0.9 mmol/L (ref 0.5–1.9)

## 2019-11-07 LAB — COMPREHENSIVE METABOLIC PANEL
ALT: 84 U/L — ABNORMAL HIGH (ref 0–44)
ALT: 70 U/L — ABNORMAL HIGH (ref 0–44)
AST: 55 U/L — ABNORMAL HIGH (ref 15–41)
AST: 41 U/L (ref 15–41)
Albumin: 2.1 g/dL — ABNORMAL LOW (ref 3.5–5.0)
Albumin: 2 g/dL — ABNORMAL LOW (ref 3.5–5.0)
Alkaline Phosphatase: 82 U/L (ref 38–126)
Alkaline Phosphatase: 84 U/L (ref 38–126)
Anion gap: 10 (ref 5–15)
Anion gap: 6 (ref 5–15)
BUN: 51 mg/dL — ABNORMAL HIGH (ref 6–20)
BUN: 53 mg/dL — ABNORMAL HIGH (ref 6–20)
CO2: 26 mmol/L (ref 22–32)
CO2: 28 mmol/L (ref 22–32)
Calcium: 7.7 mg/dL — ABNORMAL LOW (ref 8.9–10.3)
Calcium: 7.4 mg/dL — ABNORMAL LOW (ref 8.9–10.3)
Chloride: 119 mmol/L — ABNORMAL HIGH (ref 98–111)
Chloride: 122 mmol/L — ABNORMAL HIGH (ref 98–111)
Creatinine, Ser: 4.45 mg/dL — ABNORMAL HIGH (ref 0.44–1.00)
Creatinine, Ser: 4.17 mg/dL — ABNORMAL HIGH (ref 0.44–1.00)
GFR calc Af Amer: 14 mL/min — ABNORMAL LOW (ref >60)
GFR calc Af Amer: 15 mL/min — ABNORMAL LOW (ref >60)
GFR calc non Af Amer: 12 mL/min — ABNORMAL LOW (ref >60)
GFR calc non Af Amer: 13 mL/min — ABNORMAL LOW (ref >60)
Glucose, Bld: 185 mg/dL — ABNORMAL HIGH (ref 70–99)
Glucose, Bld: 105 mg/dL — ABNORMAL HIGH (ref 70–99)
Potassium: 3.4 mmol/L — ABNORMAL LOW (ref 3.5–5.1)
Potassium: 3.7 mmol/L (ref 3.5–5.1)
Sodium: 155 mmol/L — ABNORMAL HIGH (ref 135–145)
Sodium: 156 mmol/L — ABNORMAL HIGH (ref 135–145)
Total Bilirubin: 1.2 mg/dL (ref 0.3–1.2)
Total Bilirubin: 0.9 mg/dL (ref 0.3–1.2)
Total Protein: 5.2 g/dL — ABNORMAL LOW (ref 6.5–8.1)
Total Protein: 5.1 g/dL — ABNORMAL LOW (ref 6.5–8.1)

## 2019-11-07 LAB — GLUCOSE, CAPILLARY
Glucose-Capillary: 167 mg/dL — ABNORMAL HIGH (ref 70–99)
Glucose-Capillary: 183 mg/dL — ABNORMAL HIGH (ref 70–99)
Glucose-Capillary: 148 mg/dL — ABNORMAL HIGH (ref 70–99)
Glucose-Capillary: 101 mg/dL — ABNORMAL HIGH (ref 70–99)
Glucose-Capillary: 145 mg/dL — ABNORMAL HIGH (ref 70–99)
Glucose-Capillary: 157 mg/dL — ABNORMAL HIGH (ref 70–99)
Glucose-Capillary: 161 mg/dL — ABNORMAL HIGH (ref 70–99)
Glucose-Capillary: 158 mg/dL — ABNORMAL HIGH (ref 70–99)

## 2019-11-07 LAB — CBC
HCT: 23.3 % — ABNORMAL LOW (ref 36.0–46.0)
HCT: 23 % — ABNORMAL LOW (ref 36.0–46.0)
Hemoglobin: 7.1 g/dL — ABNORMAL LOW (ref 12.0–15.0)
Hemoglobin: 7.2 g/dL — ABNORMAL LOW (ref 12.0–15.0)
MCH: 25.7 pg — ABNORMAL LOW (ref 26.0–34.0)
MCH: 26.7 pg (ref 26.0–34.0)
MCHC: 30.5 g/dL (ref 30.0–36.0)
MCHC: 31.3 g/dL (ref 30.0–36.0)
MCV: 84.4 fL (ref 80.0–100.0)
MCV: 85.2 fL (ref 80.0–100.0)
Platelets: 38 10*3/uL — ABNORMAL LOW (ref 150–400)
Platelets: 46 10*3/uL — ABNORMAL LOW (ref 150–400)
RBC: 2.76 MIL/uL — ABNORMAL LOW (ref 3.87–5.11)
RBC: 2.7 MIL/uL — ABNORMAL LOW (ref 3.87–5.11)
RDW: 19.7 % — ABNORMAL HIGH (ref 11.5–15.5)
RDW: 19.2 % — ABNORMAL HIGH (ref 11.5–15.5)
WBC: 11.4 10*3/uL — ABNORMAL HIGH (ref 4.0–10.5)
WBC: 10 10*3/uL (ref 4.0–10.5)
nRBC: 0 % (ref 0.0–0.2)
nRBC: 0 % (ref 0.0–0.2)

## 2019-11-07 LAB — POCT I-STAT 7, (LYTES, BLD GAS, ICA,H+H)
Acid-Base Excess: 4 mmol/L — ABNORMAL HIGH (ref 0.0–2.0)
Acid-Base Excess: 5 mmol/L — ABNORMAL HIGH (ref 0.0–2.0)
Acid-Base Excess: 3 mmol/L — ABNORMAL HIGH (ref 0.0–2.0)
Acid-Base Excess: 4 mmol/L — ABNORMAL HIGH (ref 0.0–2.0)
Bicarbonate: 29.7 mmol/L — ABNORMAL HIGH (ref 20.0–28.0)
Bicarbonate: 30.2 mmol/L — ABNORMAL HIGH (ref 20.0–28.0)
Bicarbonate: 27.6 mmol/L (ref 20.0–28.0)
Bicarbonate: 29.6 mmol/L — ABNORMAL HIGH (ref 20.0–28.0)
Calcium, Ion: 1.11 mmol/L — ABNORMAL LOW (ref 1.15–1.40)
Calcium, Ion: 1.09 mmol/L — ABNORMAL LOW (ref 1.15–1.40)
Calcium, Ion: 1.1 mmol/L — ABNORMAL LOW (ref 1.15–1.40)
Calcium, Ion: 1.1 mmol/L — ABNORMAL LOW (ref 1.15–1.40)
HCT: 21 % — ABNORMAL LOW (ref 36.0–46.0)
HCT: 20 % — ABNORMAL LOW (ref 36.0–46.0)
HCT: 19 % — ABNORMAL LOW (ref 36.0–46.0)
HCT: 17 % — ABNORMAL LOW (ref 36.0–46.0)
Hemoglobin: 7.1 g/dL — ABNORMAL LOW (ref 12.0–15.0)
Hemoglobin: 6.8 g/dL — CL (ref 12.0–15.0)
Hemoglobin: 6.5 g/dL — CL (ref 12.0–15.0)
Hemoglobin: 5.8 g/dL — CL (ref 12.0–15.0)
O2 Saturation: 100 %
O2 Saturation: 100 %
O2 Saturation: 95 %
O2 Saturation: 100 %
Patient temperature: 37
Patient temperature: 98.6
Patient temperature: 37.1
Potassium: 3.2 mmol/L — ABNORMAL LOW (ref 3.5–5.1)
Potassium: 3.8 mmol/L (ref 3.5–5.1)
Potassium: 3.6 mmol/L (ref 3.5–5.1)
Potassium: 3.5 mmol/L (ref 3.5–5.1)
Sodium: 155 mmol/L — ABNORMAL HIGH (ref 135–145)
Sodium: 156 mmol/L — ABNORMAL HIGH (ref 135–145)
Sodium: 157 mmol/L — ABNORMAL HIGH (ref 135–145)
Sodium: 158 mmol/L — ABNORMAL HIGH (ref 135–145)
TCO2: 31 mmol/L (ref 22–32)
TCO2: 32 mmol/L (ref 22–32)
TCO2: 29 mmol/L (ref 22–32)
TCO2: 31 mmol/L (ref 22–32)
pCO2 arterial: 49.4 mmHg — ABNORMAL HIGH (ref 32.0–48.0)
pCO2 arterial: 46.5 mmHg (ref 32.0–48.0)
pCO2 arterial: 44.3 mmHg (ref 32.0–48.0)
pCO2 arterial: 47.6 mmHg (ref 32.0–48.0)
pH, Arterial: 7.388 (ref 7.350–7.450)
pH, Arterial: 7.421 (ref 7.350–7.450)
pH, Arterial: 7.403 (ref 7.350–7.450)
pH, Arterial: 7.402 (ref 7.350–7.450)
pO2, Arterial: 470 mmHg — ABNORMAL HIGH (ref 83.0–108.0)
pO2, Arterial: 436 mmHg — ABNORMAL HIGH (ref 83.0–108.0)
pO2, Arterial: 77 mmHg — ABNORMAL LOW (ref 83.0–108.0)
pO2, Arterial: 431 mmHg — ABNORMAL HIGH (ref 83.0–108.0)

## 2019-11-07 LAB — URINALYSIS, ROUTINE W REFLEX MICROSCOPIC
Bacteria, UA: NONE SEEN
Bilirubin Urine: NEGATIVE
Glucose, UA: 150 mg/dL — AB
Hgb urine dipstick: NEGATIVE
Ketones, ur: NEGATIVE mg/dL
Leukocytes,Ua: NEGATIVE
Nitrite: NEGATIVE
Protein, ur: 100 mg/dL — AB
Specific Gravity, Urine: 1.011 (ref 1.005–1.030)
pH: 9 — ABNORMAL HIGH (ref 5.0–8.0)

## 2019-11-07 LAB — APTT: aPTT: 38 seconds — ABNORMAL HIGH (ref 24–36)

## 2019-11-07 LAB — SARS CORONAVIRUS 2 (TAT 6-24 HRS): SARS Coronavirus 2: NEGATIVE

## 2019-11-07 LAB — PROTIME-INR
INR: 1.1 (ref 0.8–1.2)
INR: 1.1 (ref 0.8–1.2)
Prothrombin Time: 14.3 seconds (ref 11.4–15.2)
Prothrombin Time: 14.3 seconds (ref 11.4–15.2)

## 2019-11-07 LAB — PREPARE RBC (CROSSMATCH)

## 2019-11-07 LAB — CK TOTAL AND CKMB (NOT AT ARMC)
CK, MB: 0.8 ng/mL (ref 0.5–5.0)
Relative Index: 0.6 (ref 0.0–2.5)
Total CK: 125 U/L (ref 38–234)

## 2019-11-07 LAB — ECHOCARDIOGRAM COMPLETE
Height: 63 in
Weight: 2158.74 oz

## 2019-11-07 LAB — PHOSPHORUS: Phosphorus: 3.4 mg/dL (ref 2.5–4.6)

## 2019-11-07 LAB — AMYLASE: Amylase: 59 U/L (ref 28–100)

## 2019-11-07 LAB — PREGNANCY, URINE: Preg Test, Ur: NEGATIVE

## 2019-11-07 LAB — LIPASE, BLOOD: Lipase: 32 U/L (ref 11–51)

## 2019-11-07 LAB — TROPONIN I (HIGH SENSITIVITY): Troponin I (High Sensitivity): 227 ng/L (ref <18)

## 2019-11-07 IMAGING — DX DG CHEST 1V PORT
1 series · 1 of 1 positions shown · non-contrast
Comparison: [DATE]

CLINICAL DATA: 37 y/o female, organ donor.

EXAM:
PORTABLE CHEST - 1 VIEW

[chest ap]
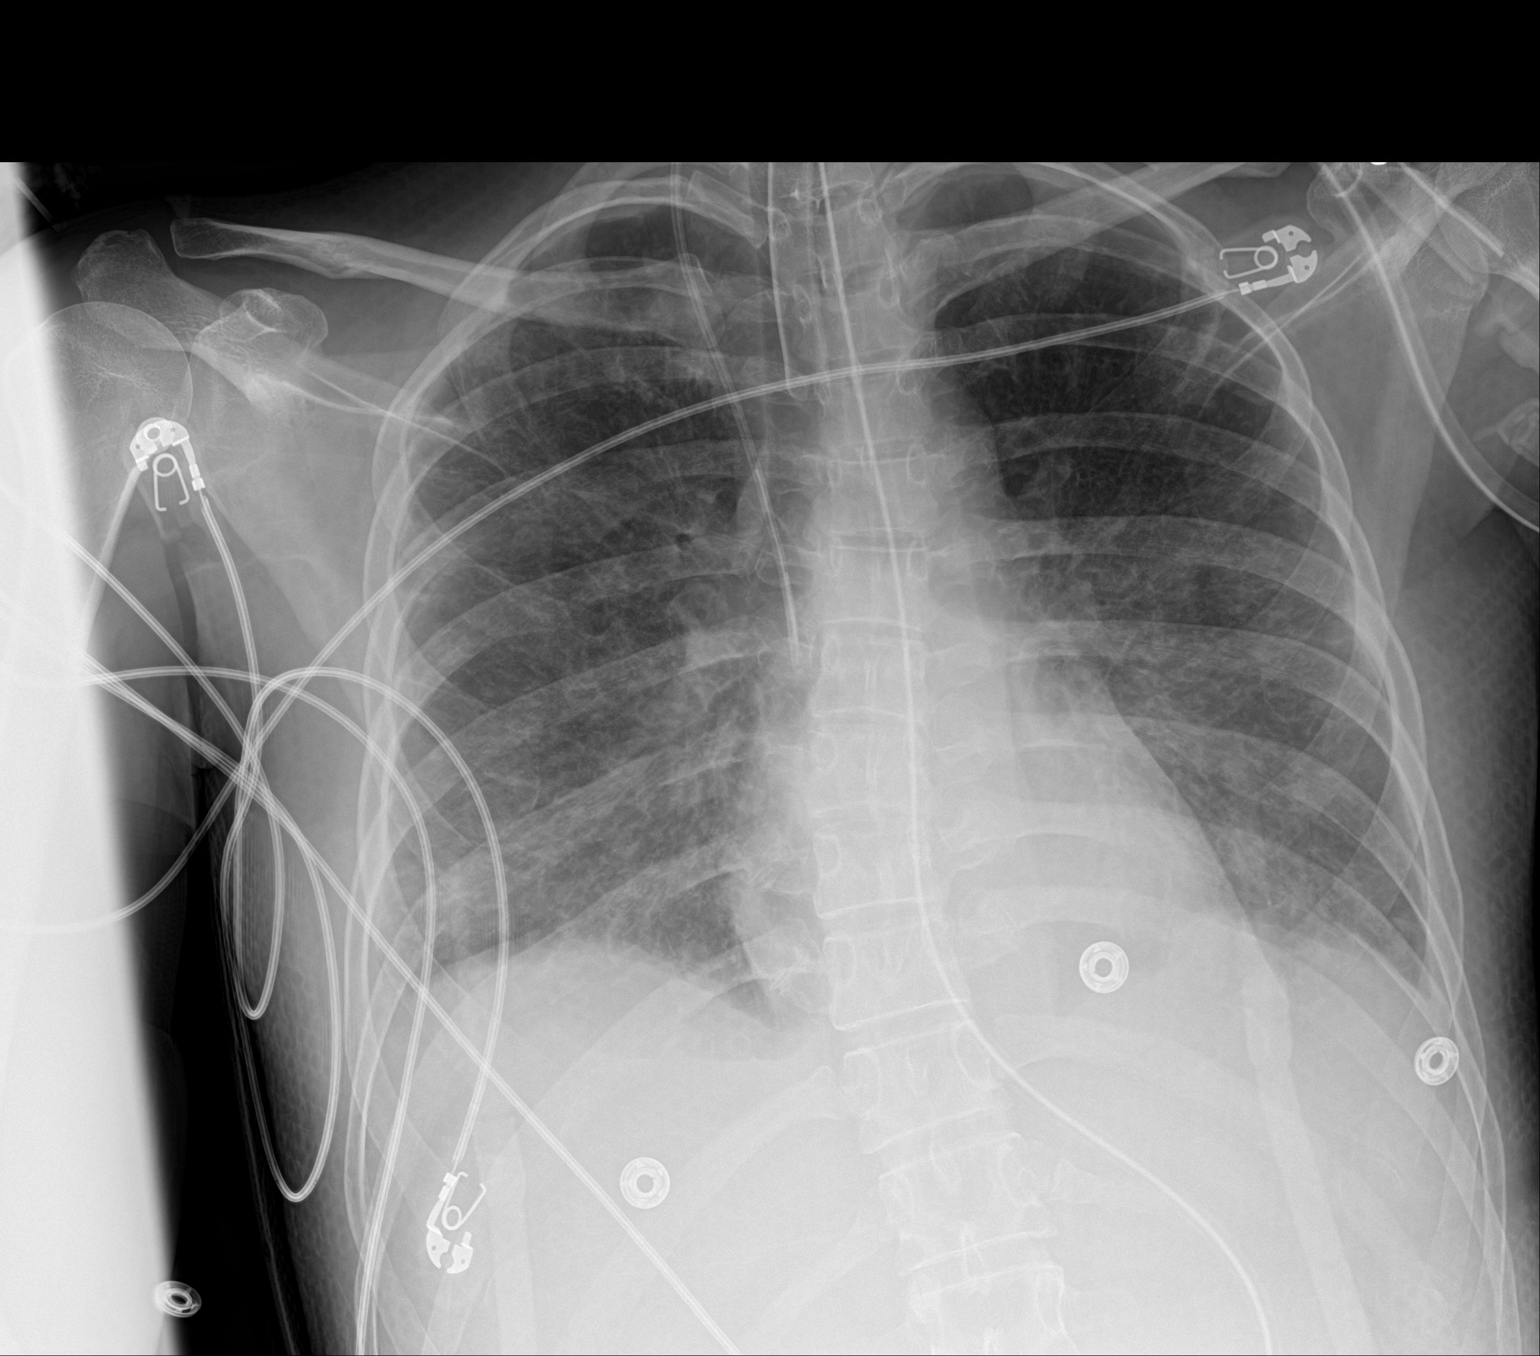

[1 of 1 positions shown; findings below may reference images not displayed]

FINDINGS: Right IJ central line, endotracheal tube, nasogastric tube stable.
Mild bibasilar interstitial edema or infiltrates, improved since
previous. Peripheral nodular poorly marginated airspace opacities
somewhat less conspicuous than on prior study.

Heart size normal. No pneumothorax.

Blunting of the right lateral costophrenic angle.

Visualized bones unremarkable. Surgical clips at the thoracic inlet.
IMPRESSION: 1. Improving bibasilar interstitial edema/infiltrates.
2.  Support hardware stable in position.

## 2019-11-07 IMAGING — US US BIOPSY CORE LIVER
1 series · 3 of 3 positions shown · non-contrast
Comparison: none

CLINICAL DATA: Liver donor evaluation

EXAM:
ULTRASOUND-GUIDED CORE LIVER BIOPSY
TECHNIQUE: Consent via [HOSPITAL] donor services.

[Series 1: us biopsy core liver · 3 of 3 slices shown]
[im 1/3]
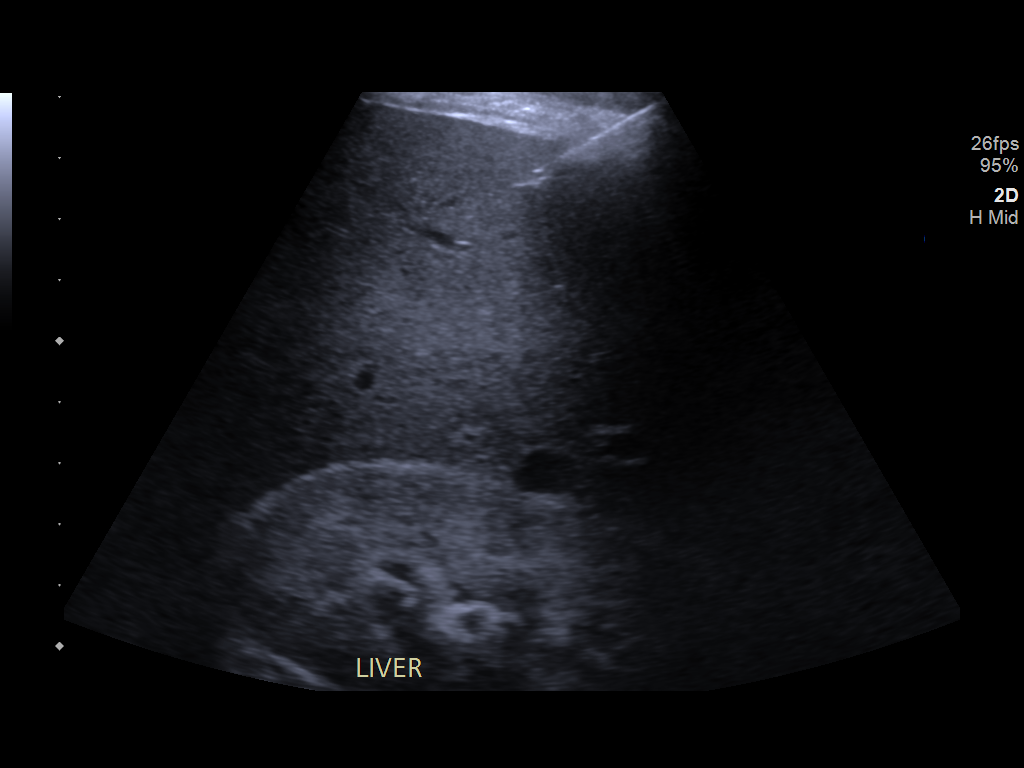
[im 2/3]
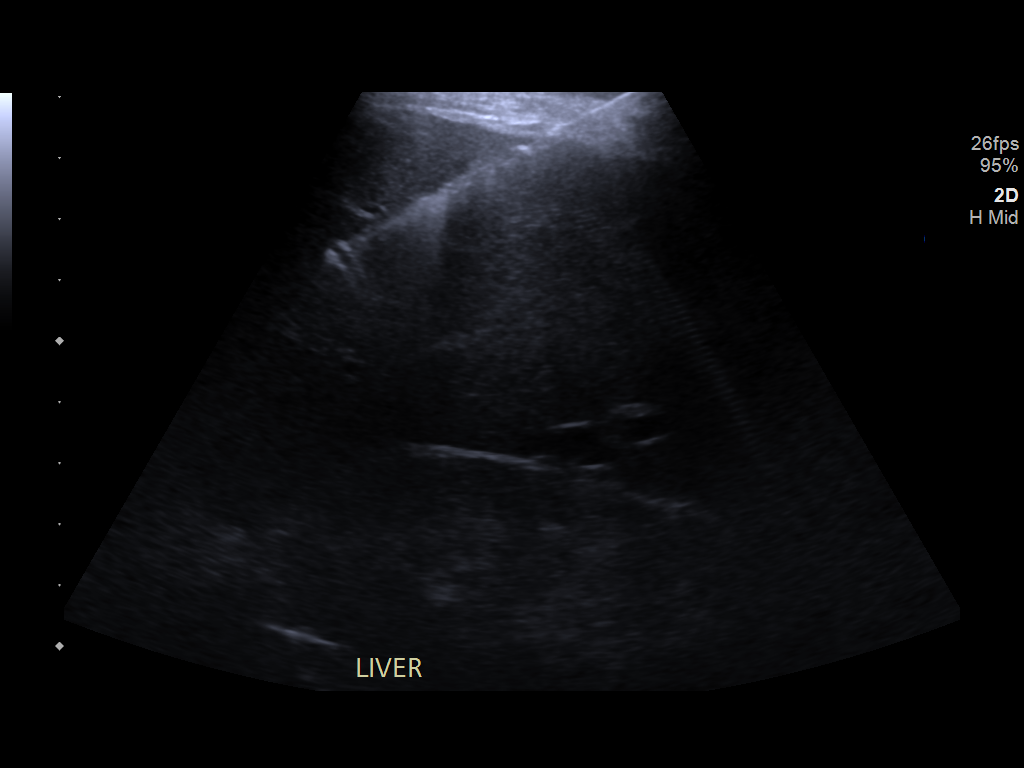
[im 3/3]
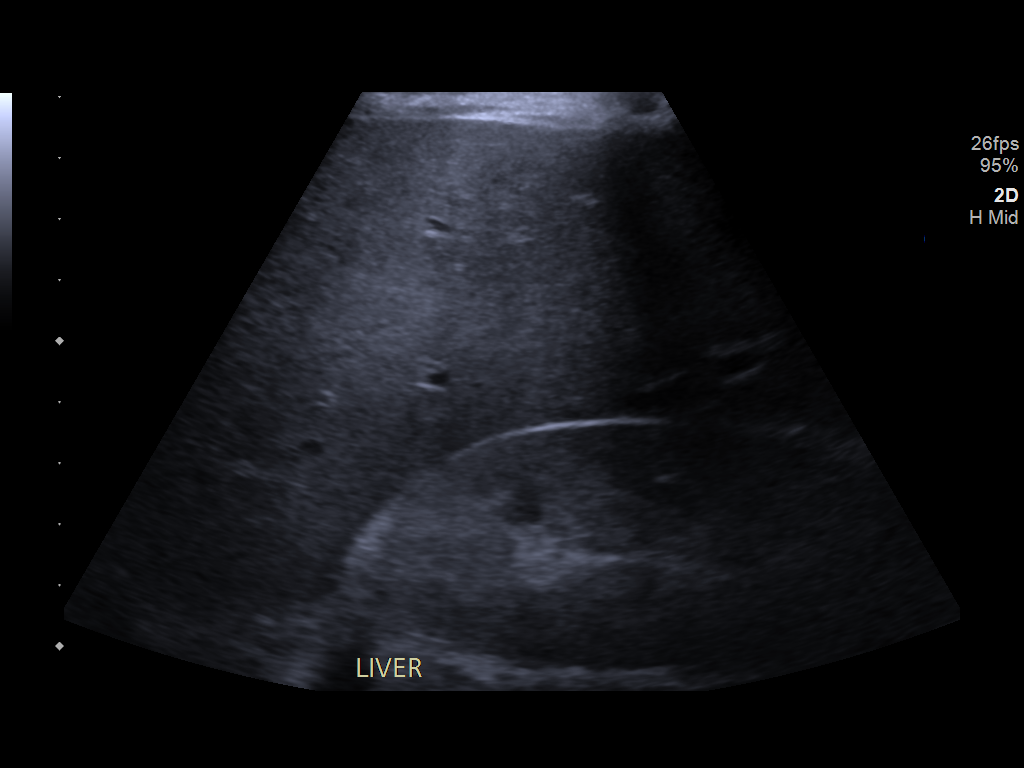

[3 of 3 positions shown; findings below may reference images not displayed]

Survey ultrasound of the liver was performed and an appropriate skin
entry site was determined. Skin site was marked, prepped with
Betadine, and draped in usual sterile fashion.A 17 gauge trocar
needle was advanced under ultrasound guidance into the liver. 3
coaxial [2B] core samples were then obtained through the guide
needle. The guide needle was removed. Post procedure scans
demonstrate no apparent complication.

COMPLICATIONS:
COMPLICATIONS
None immediate
FINDINGS: Limited survey ultrasound of the liver shows no focal lesion or
biliary ductal dilatation. Representative core biopsy samples
obtained as above.
IMPRESSION: 1. Technically successful ultrasound guided core liver biopsy.

## 2019-11-07 MED ORDER — SODIUM CHLORIDE 0.9% IV SOLUTION: Freq: Once | INTRAVENOUS | Status: DC

## 2019-11-07 MED ORDER — VANCOMYCIN HCL IN DEXTROSE 1-5 GM/200ML-% IV SOLN
1000.0000 mg | Freq: Once | INTRAVENOUS | Status: AC
  Administered 2019-11-07: 1000 mg via INTRAVENOUS
  Filled 2019-11-07: qty 200

## 2019-11-07 MED ORDER — VASOPRESSIN 20 UNIT/ML IV SOLN
0.0100 [IU]/min | INTRAVENOUS | Status: DC
  Administered 2019-11-07: 0.03 [IU]/min via INTRAVENOUS
  Filled 2019-11-07: qty 2

## 2019-11-07 MED ORDER — SODIUM CHLORIDE 0.9% IV SOLUTION
Freq: Once | INTRAVENOUS | Status: AC
  Administered 2019-11-07: 19:00:00 via INTRAVENOUS

## 2019-11-07 MED ORDER — SODIUM CHLORIDE 0.9 % IV SOLN
1000.0000 mg | INTRAVENOUS | Status: AC
  Administered 2019-11-07: 1000 mg via INTRAVENOUS
  Filled 2019-11-07: qty 8

## 2019-11-07 MED ORDER — POTASSIUM CHLORIDE 20 MEQ PO PACK
20.0000 meq | PACK | Freq: Once | ORAL | Status: AC
  Administered 2019-11-08: 20 meq
  Filled 2019-11-07: qty 1

## 2019-11-07 MED ORDER — DEXTROSE-NACL 5-0.45 % IV SOLN
INTRAVENOUS | Status: DC
  Administered 2019-11-07: 12:00:00 via INTRAVENOUS

## 2019-11-07 MED ORDER — GELATIN ABSORBABLE 12-7 MM EX MISC
CUTANEOUS | Status: AC
  Filled 2019-11-07: qty 1

## 2019-11-07 MED ORDER — LIDOCAINE HCL (PF) 1 % IJ SOLN
INTRAMUSCULAR | Status: AC
  Administered 2019-11-07: 16:00:00
  Filled 2019-11-07: qty 30

## 2019-11-07 MED ORDER — POTASSIUM CHLORIDE 20 MEQ/15ML (10%) PO SOLN
40.0000 meq | Freq: Once | ORAL | Status: AC
  Administered 2019-11-07: 06:00:00 40 meq
  Filled 2019-11-07: qty 30

## 2019-11-07 MED ORDER — SODIUM CHLORIDE 0.9% IV SOLUTION
Freq: Once | INTRAVENOUS | Status: AC
  Administered 2019-11-08: 18:00:00 via INTRAVENOUS

## 2019-11-07 MED ORDER — IPRATROPIUM BROMIDE 0.02 % IN SOLN: 0.5000 mg | RESPIRATORY_TRACT | Status: DC | PRN

## 2019-11-07 MED ORDER — DEXTROSE 5 % IV SOLN
INTRAVENOUS | Status: DC
  Administered 2019-11-07 – 2019-11-08 (×3): via INTRAVENOUS

## 2019-11-07 MED ORDER — ALBUTEROL SULFATE (2.5 MG/3ML) 0.083% IN NEBU: 2.5000 mg | INHALATION_SOLUTION | RESPIRATORY_TRACT | Status: DC | PRN

## 2019-11-07 MED ORDER — POTASSIUM CHLORIDE 10 MEQ/50ML IV SOLN
10.0000 meq | INTRAVENOUS | Status: AC
  Administered 2019-11-07 – 2019-11-08 (×2): 10 meq via INTRAVENOUS
  Filled 2019-11-07 (×2): qty 50

## 2019-11-07 NOTE — Procedures (Signed)
  Procedure: [place] core Liver 18g x3 EBL:   minimal Complications:  none immediate  See full dictation in [facility].  Dillard Cannon MD Main # [phone] Pager  [phone]

## 2019-11-07 NOTE — Progress Notes (Signed)
RT NOTE: Lung recruitment maneuver performed per order with following settings: PC mode, PC above PEEP 20, RR 10, PEEP 10, 100% FIO2 and Ti 3.00. Settings discussed with CDS before maneuver was performed to ensure accuracy for what they need. Patient tolerated well for full 2 minutes. Patient now on 100% FIO2 per CDS with ABG to be obtained after 30 minutes. Vitals are stable. RT will continue to monitor.

## 2019-11-07 NOTE — Progress Notes (Signed)
*  PRELIMINARY RESULTS* Echocardiogram 2D Echocardiogram has been performed.  Leavy Cella 11/07/2019, 12:56 PM

## 2019-11-07 NOTE — Progress Notes (Signed)
Dr Saralyn Pilar called to unit stating he was on his way to the hospital .

## 2019-11-07 NOTE — Progress Notes (Signed)
Called pathology to let them know of liver biopsy. Dr. Suezanne Jacquet listed as on call but he was not on call. Lab got a hold of him after we tried to page him. He directed [place] to Dr Saralyn Pilar, who we called via phone. I called him and explained this was a donor case and we needed results today, IR had collected specimen already. He stated he would be in to do pathology.
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
## 2019-11-08 ENCOUNTER — Encounter (HOSPITAL_COMMUNITY): Admission: EM | Disposition: E | Payer: Self-pay | Source: Home / Self Care | Attending: Emergency Medicine

## 2019-11-08 ENCOUNTER — Inpatient Hospital Stay (HOSPITAL_COMMUNITY): Payer: Medicaid Other | Admitting: Anesthesiology

## 2019-11-08 ENCOUNTER — Encounter (HOSPITAL_COMMUNITY): Payer: Medicaid Other | Admitting: Anesthesiology

## 2019-11-08 HISTORY — PX: ORGAN PROCUREMENT: SHX5270

## 2019-11-08 LAB — PREPARE PLATELET PHERESIS
Unit division: 0
Unit division: 0
Unit division: 0

## 2019-11-08 LAB — BPAM PLATELET PHERESIS
Blood Product Expiration Date: 202011082359
Blood Product Expiration Date: 202011082359
Blood Product Expiration Date: 202011092359
ISSUE DATE / TIME: 202011071013
ISSUE DATE / TIME: 202011071812
ISSUE DATE / TIME: 202011072000
Unit Type and Rh: 5100
Unit Type and Rh: 6200
Unit Type and Rh: 6200

## 2019-11-08 LAB — COMPREHENSIVE METABOLIC PANEL
ALT: 57 U/L — ABNORMAL HIGH (ref 0–44)
ALT: 42 U/L (ref 0–44)
AST: 31 U/L (ref 15–41)
AST: 23 U/L (ref 15–41)
Albumin: 2 g/dL — ABNORMAL LOW (ref 3.5–5.0)
Albumin: 2.2 g/dL — ABNORMAL LOW (ref 3.5–5.0)
Alkaline Phosphatase: 123 U/L (ref 38–126)
Alkaline Phosphatase: 105 U/L (ref 38–126)
Anion gap: 7 (ref 5–15)
Anion gap: 9 (ref 5–15)
BUN: 54 mg/dL — ABNORMAL HIGH (ref 6–20)
BUN: 55 mg/dL — ABNORMAL HIGH (ref 6–20)
CO2: 26 mmol/L (ref 22–32)
CO2: 25 mmol/L (ref 22–32)
Calcium: 7.9 mg/dL — ABNORMAL LOW (ref 8.9–10.3)
Calcium: 8.1 mg/dL — ABNORMAL LOW (ref 8.9–10.3)
Chloride: 125 mmol/L — ABNORMAL HIGH (ref 98–111)
Chloride: 123 mmol/L — ABNORMAL HIGH (ref 98–111)
Creatinine, Ser: 4.19 mg/dL — ABNORMAL HIGH (ref 0.44–1.00)
Creatinine, Ser: 4.03 mg/dL — ABNORMAL HIGH (ref 0.44–1.00)
GFR calc Af Amer: 15 mL/min — ABNORMAL LOW (ref >60)
GFR calc Af Amer: 16 mL/min — ABNORMAL LOW (ref >60)
GFR calc non Af Amer: 13 mL/min — ABNORMAL LOW (ref >60)
GFR calc non Af Amer: 13 mL/min — ABNORMAL LOW (ref >60)
Glucose, Bld: 179 mg/dL — ABNORMAL HIGH (ref 70–99)
Glucose, Bld: 179 mg/dL — ABNORMAL HIGH (ref 70–99)
Potassium: 3.7 mmol/L (ref 3.5–5.1)
Potassium: 3.2 mmol/L — ABNORMAL LOW (ref 3.5–5.1)
Sodium: 158 mmol/L — ABNORMAL HIGH (ref 135–145)
Sodium: 157 mmol/L — ABNORMAL HIGH (ref 135–145)
Total Bilirubin: 1.4 mg/dL — ABNORMAL HIGH (ref 0.3–1.2)
Total Bilirubin: 1.3 mg/dL — ABNORMAL HIGH (ref 0.3–1.2)
Total Protein: 5 g/dL — ABNORMAL LOW (ref 6.5–8.1)
Total Protein: 5.2 g/dL — ABNORMAL LOW (ref 6.5–8.1)

## 2019-11-08 LAB — CBC WITH DIFFERENTIAL/PLATELET
Abs Immature Granulocytes: 0.1 10*3/uL — ABNORMAL HIGH (ref 0.00–0.07)
Abs Immature Granulocytes: 0.11 10*3/uL — ABNORMAL HIGH (ref 0.00–0.07)
Basophils Absolute: 0 10*3/uL (ref 0.0–0.1)
Basophils Absolute: 0 10*3/uL (ref 0.0–0.1)
Basophils Relative: 0 %
Basophils Relative: 0 %
Eosinophils Absolute: 0 10*3/uL (ref 0.0–0.5)
Eosinophils Absolute: 0 10*3/uL (ref 0.0–0.5)
Eosinophils Relative: 0 %
Eosinophils Relative: 0 %
HCT: 21.5 % — ABNORMAL LOW (ref 36.0–46.0)
HCT: 19.1 % — ABNORMAL LOW (ref 36.0–46.0)
Hemoglobin: 6.9 g/dL — CL (ref 12.0–15.0)
Hemoglobin: 6.2 g/dL — CL (ref 12.0–15.0)
Immature Granulocytes: 1 %
Immature Granulocytes: 1 %
Lymphocytes Relative: 10 %
Lymphocytes Relative: 11 %
Lymphs Abs: 1.1 10*3/uL (ref 0.7–4.0)
Lymphs Abs: 1.2 10*3/uL (ref 0.7–4.0)
MCH: 27.3 pg (ref 26.0–34.0)
MCH: 27.6 pg (ref 26.0–34.0)
MCHC: 32.1 g/dL (ref 30.0–36.0)
MCHC: 32.5 g/dL (ref 30.0–36.0)
MCV: 85 fL (ref 80.0–100.0)
MCV: 84.9 fL (ref 80.0–100.0)
Monocytes Absolute: 0.4 10*3/uL (ref 0.1–1.0)
Monocytes Absolute: 0.4 10*3/uL (ref 0.1–1.0)
Monocytes Relative: 3 %
Monocytes Relative: 3 %
Neutro Abs: 9.9 10*3/uL — ABNORMAL HIGH (ref 1.7–7.7)
Neutro Abs: 9 10*3/uL — ABNORMAL HIGH (ref 1.7–7.7)
Neutrophils Relative %: 86 %
Neutrophils Relative %: 85 %
Platelets: 66 10*3/uL — ABNORMAL LOW (ref 150–400)
Platelets: 60 10*3/uL — ABNORMAL LOW (ref 150–400)
RBC: 2.53 MIL/uL — ABNORMAL LOW (ref 3.87–5.11)
RBC: 2.25 MIL/uL — ABNORMAL LOW (ref 3.87–5.11)
RDW: 18.6 % — ABNORMAL HIGH (ref 11.5–15.5)
RDW: 18.9 % — ABNORMAL HIGH (ref 11.5–15.5)
WBC: 11.4 10*3/uL — ABNORMAL HIGH (ref 4.0–10.5)
WBC: 10.7 10*3/uL — ABNORMAL HIGH (ref 4.0–10.5)
nRBC: 0.3 % — ABNORMAL HIGH (ref 0.0–0.2)
nRBC: 0.3 % — ABNORMAL HIGH (ref 0.0–0.2)

## 2019-11-08 LAB — POCT I-STAT 7, (LYTES, BLD GAS, ICA,H+H)
Acid-Base Excess: 3 mmol/L — ABNORMAL HIGH (ref 0.0–2.0)
Acid-Base Excess: 1 mmol/L (ref 0.0–2.0)
Acid-Base Excess: 4 mmol/L — ABNORMAL HIGH (ref 0.0–2.0)
Acid-Base Excess: 4 mmol/L — ABNORMAL HIGH (ref 0.0–2.0)
Bicarbonate: 27.6 mmol/L (ref 20.0–28.0)
Bicarbonate: 25.6 mmol/L (ref 20.0–28.0)
Bicarbonate: 28.3 mmol/L — ABNORMAL HIGH (ref 20.0–28.0)
Bicarbonate: 28.8 mmol/L — ABNORMAL HIGH (ref 20.0–28.0)
Calcium, Ion: 1.17 mmol/L (ref 1.15–1.40)
Calcium, Ion: 1.19 mmol/L (ref 1.15–1.40)
Calcium, Ion: 1.16 mmol/L (ref 1.15–1.40)
Calcium, Ion: 1.2 mmol/L (ref 1.15–1.40)
HCT: 19 % — ABNORMAL LOW (ref 36.0–46.0)
HCT: 18 % — ABNORMAL LOW (ref 36.0–46.0)
HCT: 17 % — ABNORMAL LOW (ref 36.0–46.0)
HCT: 22 % — ABNORMAL LOW (ref 36.0–46.0)
Hemoglobin: 6.5 g/dL — CL (ref 12.0–15.0)
Hemoglobin: 6.1 g/dL — CL (ref 12.0–15.0)
Hemoglobin: 5.8 g/dL — CL (ref 12.0–15.0)
Hemoglobin: 7.5 g/dL — ABNORMAL LOW (ref 12.0–15.0)
O2 Saturation: 97 %
O2 Saturation: 96 %
O2 Saturation: 100 %
O2 Saturation: 100 %
Patient temperature: 37
Patient temperature: 36.2
Patient temperature: 37.2
Patient temperature: 37.5
Potassium: 4.2 mmol/L (ref 3.5–5.1)
Potassium: 3.6 mmol/L (ref 3.5–5.1)
Potassium: 3.7 mmol/L (ref 3.5–5.1)
Potassium: 3.5 mmol/L (ref 3.5–5.1)
Sodium: 157 mmol/L — ABNORMAL HIGH (ref 135–145)
Sodium: 160 mmol/L — ABNORMAL HIGH (ref 135–145)
Sodium: 160 mmol/L — ABNORMAL HIGH (ref 135–145)
Sodium: 158 mmol/L — ABNORMAL HIGH (ref 135–145)
TCO2: 29 mmol/L (ref 22–32)
TCO2: 27 mmol/L (ref 22–32)
TCO2: 29 mmol/L (ref 22–32)
TCO2: 30 mmol/L (ref 22–32)
pCO2 arterial: 43.1 mmHg (ref 32.0–48.0)
pCO2 arterial: 38.4 mmHg (ref 32.0–48.0)
pCO2 arterial: 40.5 mmHg (ref 32.0–48.0)
pCO2 arterial: 44.2 mmHg (ref 32.0–48.0)
pH, Arterial: 7.416 (ref 7.350–7.450)
pH, Arterial: 7.428 (ref 7.350–7.450)
pH, Arterial: 7.453 — ABNORMAL HIGH (ref 7.350–7.450)
pH, Arterial: 7.424 (ref 7.350–7.450)
pO2, Arterial: 90 mmHg (ref 83.0–108.0)
pO2, Arterial: 79 mmHg — ABNORMAL LOW (ref 83.0–108.0)
pO2, Arterial: 387 mmHg — ABNORMAL HIGH (ref 83.0–108.0)
pO2, Arterial: 451 mmHg — ABNORMAL HIGH (ref 83.0–108.0)

## 2019-11-08 LAB — MAGNESIUM: Magnesium: 2.3 mg/dL (ref 1.7–2.4)

## 2019-11-08 LAB — TROPONIN I (HIGH SENSITIVITY): Troponin I (High Sensitivity): 193 ng/L (ref <18)

## 2019-11-08 LAB — APTT
aPTT: 29 seconds (ref 24–36)
aPTT: 29 seconds (ref 24–36)
aPTT: 27 seconds (ref 24–36)

## 2019-11-08 LAB — GLUCOSE, CAPILLARY
Glucose-Capillary: 133 mg/dL — ABNORMAL HIGH (ref 70–99)
Glucose-Capillary: 148 mg/dL — ABNORMAL HIGH (ref 70–99)
Glucose-Capillary: 156 mg/dL — ABNORMAL HIGH (ref 70–99)
Glucose-Capillary: 157 mg/dL — ABNORMAL HIGH (ref 70–99)
Glucose-Capillary: 158 mg/dL — ABNORMAL HIGH (ref 70–99)
Glucose-Capillary: 160 mg/dL — ABNORMAL HIGH (ref 70–99)
Glucose-Capillary: 164 mg/dL — ABNORMAL HIGH (ref 70–99)
Glucose-Capillary: 170 mg/dL — ABNORMAL HIGH (ref 70–99)
Glucose-Capillary: 171 mg/dL — ABNORMAL HIGH (ref 70–99)
Glucose-Capillary: 179 mg/dL — ABNORMAL HIGH (ref 70–99)
Glucose-Capillary: 193 mg/dL — ABNORMAL HIGH (ref 70–99)
Glucose-Capillary: 195 mg/dL — ABNORMAL HIGH (ref 70–99)

## 2019-11-08 LAB — URINALYSIS, ROUTINE W REFLEX MICROSCOPIC
Bilirubin Urine: NEGATIVE
Glucose, UA: 50 mg/dL — AB
Ketones, ur: NEGATIVE mg/dL
Leukocytes,Ua: NEGATIVE
Nitrite: NEGATIVE
Protein, ur: 30 mg/dL — AB
Specific Gravity, Urine: 1.008 (ref 1.005–1.030)
pH: 7 (ref 5.0–8.0)

## 2019-11-08 LAB — CBC
HCT: 23.9 % — ABNORMAL LOW (ref 36.0–46.0)
Hemoglobin: 8 g/dL — ABNORMAL LOW (ref 12.0–15.0)
MCH: 28.6 pg (ref 26.0–34.0)
MCHC: 33.5 g/dL (ref 30.0–36.0)
MCV: 85.4 fL (ref 80.0–100.0)
Platelets: 93 10*3/uL — ABNORMAL LOW (ref 150–400)
RBC: 2.8 MIL/uL — ABNORMAL LOW (ref 3.87–5.11)
RDW: 16 % — ABNORMAL HIGH (ref 11.5–15.5)
WBC: 11.7 10*3/uL — ABNORMAL HIGH (ref 4.0–10.5)
nRBC: 0.3 % — ABNORMAL HIGH (ref 0.0–0.2)

## 2019-11-08 LAB — PREPARE RBC (CROSSMATCH)

## 2019-11-08 LAB — PROTIME-INR
INR: 1.2 (ref 0.8–1.2)
INR: 1.2 (ref 0.8–1.2)
INR: 1.2 (ref 0.8–1.2)
Prothrombin Time: 15.2 seconds (ref 11.4–15.2)
Prothrombin Time: 15.4 seconds — ABNORMAL HIGH (ref 11.4–15.2)
Prothrombin Time: 15 seconds (ref 11.4–15.2)

## 2019-11-08 LAB — CULTURE, RESPIRATORY W GRAM STAIN

## 2019-11-08 LAB — BASIC METABOLIC PANEL
Anion gap: 10 (ref 5–15)
BUN: 54 mg/dL — ABNORMAL HIGH (ref 6–20)
CO2: 24 mmol/L (ref 22–32)
Calcium: 8 mg/dL — ABNORMAL LOW (ref 8.9–10.3)
Chloride: 126 mmol/L — ABNORMAL HIGH (ref 98–111)
Creatinine, Ser: 4.08 mg/dL — ABNORMAL HIGH (ref 0.44–1.00)
GFR calc Af Amer: 15 mL/min — ABNORMAL LOW (ref >60)
GFR calc non Af Amer: 13 mL/min — ABNORMAL LOW (ref >60)
Glucose, Bld: 164 mg/dL — ABNORMAL HIGH (ref 70–99)
Potassium: 3.7 mmol/L (ref 3.5–5.1)
Sodium: 160 mmol/L — ABNORMAL HIGH (ref 135–145)

## 2019-11-08 LAB — URINE CULTURE
Culture: NO GROWTH
Special Requests: NORMAL

## 2019-11-08 LAB — BILIRUBIN, DIRECT: Bilirubin, Direct: 0.1 mg/dL (ref 0.0–0.2)

## 2019-11-08 LAB — LACTATE DEHYDROGENASE: LDH: 1510 U/L — ABNORMAL HIGH (ref 98–192)

## 2019-11-08 LAB — BILIRUBIN, TOTAL: Total Bilirubin: 1.5 mg/dL — ABNORMAL HIGH (ref 0.3–1.2)

## 2019-11-08 LAB — RETICULOCYTES
Immature Retic Fract: 15.2 % (ref 2.3–15.9)
RBC.: 2.51 MIL/uL — ABNORMAL LOW (ref 3.87–5.11)
Retic Count, Absolute: 29.1 10*3/uL (ref 19.0–186.0)
Retic Ct Pct: 1.2 % (ref 0.4–3.1)

## 2019-11-08 LAB — OCCULT BLOOD X 1 CARD TO LAB, STOOL: Fecal Occult Bld: POSITIVE — AB

## 2019-11-08 LAB — FIBRINOGEN
Fibrinogen: 651 mg/dL — ABNORMAL HIGH (ref 210–475)
Fibrinogen: 559 mg/dL — ABNORMAL HIGH (ref 210–475)
Fibrinogen: 504 mg/dL — ABNORMAL HIGH (ref 210–475)

## 2019-11-08 LAB — ALT: ALT: 51 U/L — ABNORMAL HIGH (ref 0–44)

## 2019-11-08 LAB — AST: AST: 25 U/L (ref 15–41)

## 2019-11-08 LAB — PHOSPHORUS: Phosphorus: 3.4 mg/dL (ref 2.5–4.6)

## 2019-11-08 SURGERY — SURGICAL PROCUREMENT, ORGAN
Anesthesia: General

## 2019-11-08 SURGERY — SURGICAL PROCUREMENT, ORGAN
Anesthesia: Choice

## 2019-11-08 MED ORDER — PIPERACILLIN-TAZOBACTAM 3.375 G IVPB: 3.3750 g | Freq: Three times a day (TID) | INTRAVENOUS | Status: DC

## 2019-11-08 MED ORDER — ALBUMIN HUMAN 5 % IV SOLN
INTRAVENOUS | Status: DC | PRN
  Administered 2019-11-08: 22:00:00 via INTRAVENOUS

## 2019-11-08 MED ORDER — SODIUM CHLORIDE 0.9 % IV SOLN
500.0000 mg | INTRAVENOUS | Status: DC
  Administered 2019-11-08: 04:00:00 500 mg via INTRAVENOUS
  Filled 2019-11-08 (×2): qty 500

## 2019-11-08 MED ORDER — VANCOMYCIN HCL IN DEXTROSE 1-5 GM/200ML-% IV SOLN: 1000.0000 mg | INTRAVENOUS | Status: DC

## 2019-11-08 MED ORDER — INSULIN ASPART 100 UNIT/ML ~~LOC~~ SOLN
10.0000 [IU] | Freq: Once | SUBCUTANEOUS | Status: AC
  Administered 2019-11-08: 10 [IU] via SUBCUTANEOUS

## 2019-11-08 MED ORDER — PIPERACILLIN-TAZOBACTAM 3.375 G IVPB 30 MIN
3.3750 g | Freq: Once | INTRAVENOUS | Status: AC
  Administered 2019-11-08: 3.375 g via INTRAVENOUS
  Filled 2019-11-08: qty 50

## 2019-11-08 MED ORDER — PHENYLEPHRINE HCL-NACL 20-0.9 MG/250ML-% IV SOLN
INTRAVENOUS | Status: DC | PRN
  Administered 2019-11-08: 100 ug/min via INTRAVENOUS

## 2019-11-08 MED ORDER — LACTATED RINGERS IV SOLN
INTRAVENOUS | Status: DC | PRN
  Administered 2019-11-08: 23:00:00 via INTRAVENOUS

## 2019-11-08 MED ORDER — PIPERACILLIN-TAZOBACTAM IN DEX 2-0.25 GM/50ML IV SOLN
2.2500 g | Freq: Three times a day (TID) | INTRAVENOUS | Status: DC
  Administered 2019-11-08: 17:00:00 2.25 g via INTRAVENOUS
  Filled 2019-11-08 (×3): qty 50

## 2019-11-08 MED ORDER — SODIUM CHLORIDE 0.9 % IV SOLN
INTRAVENOUS | Status: DC | PRN
  Administered 2019-11-08: 21:00:00 via INTRAVENOUS

## 2019-11-08 MED ORDER — HEPARIN SODIUM (PORCINE) 1000 UNIT/ML IJ SOLN
INTRAMUSCULAR | Status: DC | PRN
  Administered 2019-11-08: 30000 [IU] via INTRAVENOUS

## 2019-11-08 MED ORDER — SODIUM CHLORIDE 0.9 % IV SOLN
1000.0000 mg | Freq: Once | INTRAVENOUS | Status: AC
  Administered 2019-11-08: 1000 mg via INTRAVENOUS
  Filled 2019-11-08: qty 8

## 2019-11-08 MED ORDER — ROCURONIUM 10MG/ML (10ML) SYRINGE FOR MEDFUSION PUMP - OPTIME
INTRAVENOUS | Status: DC | PRN
  Administered 2019-11-08: 50 mg via INTRAVENOUS

## 2019-11-08 MED ORDER — ALBUMIN HUMAN 5 % IV SOLN
12.5000 g | Freq: Once | INTRAVENOUS | Status: AC
  Administered 2019-11-08: 12.5 g via INTRAVENOUS
  Filled 2019-11-08: qty 250

## 2019-11-08 MED ORDER — SODIUM CHLORIDE 0.9% IV SOLUTION
Freq: Once | INTRAVENOUS | Status: AC
  Administered 2019-11-08: 12:00:00 via INTRAVENOUS

## 2019-11-08 SURGICAL SUPPLY — 93 items
BLADE SAW STERNAL (BLADE) ×3 IMPLANT
BLADE STERNUM SYSTEM 6 (BLADE) ×3 IMPLANT
BLADE SURG 10 STRL SS (BLADE) IMPLANT
CANNULA VESSEL 3MM 2 BLNT TIP (CANNULA) ×2 IMPLANT
CLEANER TIP ELECTROSURG 2X2 (MISCELLANEOUS) ×2 IMPLANT
CLIP VESOCCLUDE MED 24/CT (CLIP) ×3 IMPLANT
CLIP VESOCCLUDE SM WIDE 24/CT (CLIP) ×3 IMPLANT
CONT SPEC 4OZ CLIKSEAL STRL BL (MISCELLANEOUS) ×5 IMPLANT
COVER BACK TABLE 60X90IN (DRAPES) ×3 IMPLANT
COVER MAYO STAND STRL (DRAPES) IMPLANT
COVER SURGICAL LIGHT HANDLE (MISCELLANEOUS) ×3 IMPLANT
COVER WAND RF STERILE (DRAPES) ×3 IMPLANT
DRAPE HALF SHEET 40X57 (DRAPES) IMPLANT
DRAPE SLUSH MACHINE 52X66 (DRAPES) ×3 IMPLANT
DRAPE SLUSH/WARMER DISC (DRAPES) ×4 IMPLANT
DRSG COVADERM 4X10 (GAUZE/BANDAGES/DRESSINGS) ×6 IMPLANT
DRSG COVADERM 4X14 (GAUZE/BANDAGES/DRESSINGS) ×2 IMPLANT
DRSG COVADERM 4X8 (GAUZE/BANDAGES/DRESSINGS) ×2 IMPLANT
DRSG TELFA 3X8 NADH (GAUZE/BANDAGES/DRESSINGS) ×9 IMPLANT
DURAPREP 26ML APPLICATOR (WOUND CARE) ×4 IMPLANT
ELECT BLADE 6.5 EXT (BLADE) ×3 IMPLANT
ELECT REM PT RETURN 9FT ADLT (ELECTROSURGICAL) ×6
ELECTRODE REM PT RTRN 9FT ADLT (ELECTROSURGICAL) ×2 IMPLANT
GAUZE 4X4 16PLY RFD (DISPOSABLE) IMPLANT
GLOVE BIO SURGEON STRL SZ7 (GLOVE) IMPLANT
GLOVE BIO SURGEON STRL SZ7.5 (GLOVE) IMPLANT
GLOVE BIO SURGEON STRL SZ8 (GLOVE) IMPLANT
GLOVE BIO SURGEON STRL SZ8.5 (GLOVE) IMPLANT
GLOVE BIOGEL PI IND STRL 7.0 (GLOVE) IMPLANT
GLOVE BIOGEL PI IND STRL 7.5 (GLOVE) IMPLANT
GLOVE BIOGEL PI IND STRL 8 (GLOVE) IMPLANT
GLOVE BIOGEL PI IND STRL 8.5 (GLOVE) IMPLANT
GLOVE BIOGEL PI INDICATOR 7.0 (GLOVE) ×2
GLOVE BIOGEL PI INDICATOR 7.5 (GLOVE)
GLOVE BIOGEL PI INDICATOR 8 (GLOVE)
GLOVE BIOGEL PI INDICATOR 8.5 (GLOVE)
GLOVE SURG SS PI 7.0 STRL IVOR (GLOVE) ×3 IMPLANT
GLOVE SURG SS PI 7.5 STRL IVOR (GLOVE) IMPLANT
GLOVE SURG SS PI 8.0 STRL IVOR (GLOVE) IMPLANT
GOWN STRL REIN XL XLG (GOWN DISPOSABLE) ×2 IMPLANT
GOWN STRL REUS W/ TWL LRG LVL3 (GOWN DISPOSABLE) ×4 IMPLANT
GOWN STRL REUS W/ TWL XL LVL3 (GOWN DISPOSABLE) ×2 IMPLANT
GOWN STRL REUS W/TWL LRG LVL3 (GOWN DISPOSABLE) ×12
GOWN STRL REUS W/TWL XL LVL3 (GOWN DISPOSABLE) ×6
HANDLE SUCTION POOLE (INSTRUMENTS) IMPLANT
KIT POST MORTEM ADULT 36X90 (BAG) ×3 IMPLANT
KIT TURNOVER KIT B (KITS) ×3 IMPLANT
LOOP VESSEL MAXI BLUE (MISCELLANEOUS) IMPLANT
LOOP VESSEL MINI RED (MISCELLANEOUS) IMPLANT
MANIFOLD NEPTUNE II (INSTRUMENTS) ×3 IMPLANT
NDL BIOPSY 14X6 SOFT TISS (NEEDLE) IMPLANT
NDL HYPO 25GX1X1/2 BEV (NEEDLE) IMPLANT
NEEDLE BIOPSY 14X6 SOFT TISS (NEEDLE) ×6 IMPLANT
NEEDLE HYPO 25GX1X1/2 BEV (NEEDLE) ×3 IMPLANT
NS IRRIG 1000ML POUR BTL (IV SOLUTION) ×18 IMPLANT
PACK AORTA (CUSTOM PROCEDURE TRAY) ×3 IMPLANT
PAD ARMBOARD 7.5X6 YLW CONV (MISCELLANEOUS) ×6 IMPLANT
PAD DRESSING TELFA 3X8 NADH (GAUZE/BANDAGES/DRESSINGS) ×1 IMPLANT
PENCIL BUTTON HOLSTER BLD 10FT (ELECTRODE) ×3 IMPLANT
SPONGE INTESTINAL PEANUT (DISPOSABLE) IMPLANT
SPONGE LAP 18X18 RF (DISPOSABLE) ×6 IMPLANT
STAPLER VISISTAT 35W (STAPLE) ×5 IMPLANT
SUCTION POOLE HANDLE (INSTRUMENTS) ×6
SUT BONE WAX W31G (SUTURE) ×2 IMPLANT
SUT ETHILON 2 LR (SUTURE) ×4 IMPLANT
SUT PROLENE 3 0 RB 1 (SUTURE) IMPLANT
SUT PROLENE 3 0 SH 1 (SUTURE) IMPLANT
SUT PROLENE 4 0 RB 1 (SUTURE) ×3
SUT PROLENE 4-0 RB1 .5 CRCL 36 (SUTURE) IMPLANT
SUT PROLENE 5 0 C 1 24 (SUTURE) ×4 IMPLANT
SUT PROLENE 6 0 BV (SUTURE) IMPLANT
SUT SILK 0 TIES 10X30 (SUTURE) ×2 IMPLANT
SUT SILK 1 SH (SUTURE) IMPLANT
SUT SILK 1 TIES 10X30 (SUTURE) ×2 IMPLANT
SUT SILK 2 0 (SUTURE)
SUT SILK 2 0 SH (SUTURE) IMPLANT
SUT SILK 2 0 SH CR/8 (SUTURE) ×2 IMPLANT
SUT SILK 2 0 TIES 10X30 (SUTURE) ×2 IMPLANT
SUT SILK 2-0 18XBRD TIE 12 (SUTURE) IMPLANT
SUT SILK 3 0 SH CR/8 (SUTURE) IMPLANT
SUT SILK 3 0 TIES 10X30 (SUTURE) IMPLANT
SWAB COLLECTION DEVICE MRSA (MISCELLANEOUS) IMPLANT
SWAB CULTURE ESWAB REG 1ML (MISCELLANEOUS) IMPLANT
SYR 50ML LL SCALE MARK (SYRINGE) IMPLANT
SYR 5ML LL (SYRINGE) ×2 IMPLANT
SYRINGE TOOMEY DISP (SYRINGE) IMPLANT
TAPE UMBILICAL 1/8 X36 TWILL (MISCELLANEOUS) IMPLANT
TUBE CONNECTING 12'X1/4 (SUCTIONS) ×1
TUBE CONNECTING 12X1/4 (SUCTIONS) ×2 IMPLANT
TUBE CONNECTING 20'X1/4 (TUBING) ×1
TUBE CONNECTING 20X1/4 (TUBING) ×2 IMPLANT
WATER STERILE IRR 1000ML POUR (IV SOLUTION) ×3 IMPLANT
YANKAUER SUCT BULB TIP NO VENT (SUCTIONS) ×3 IMPLANT

## 2019-11-08 NOTE — Progress Notes (Signed)
RT NOTE: Recruitment maneuver performed at this time per order with following settings: PC 20, RR 10, PEEP 10, FIO2 100%, and I time of 3 seconds. Patient tolerated well for full two minutes. Vitals are stable. RT will continue to monitor.

## 2019-11-08 NOTE — Progress Notes (Signed)
Pharmacy Antibiotic Note  Monica Stephens is a 38 y.o. female admitted on 11/18/2019 now possible sepsis.  Pharmacy has been consulted for Vancomycin and zosyn  Dosing per Organ donor services. Vanc 1gm given last pm, Zosyn 3.375 gm x1 this am  Cr 4, wt 60kg Crcl about 53ml/min  Plan: Vancomycin 1gm q48h Zosyn 2.25 gm IV q8h  Height: 5\' 3"  (160 cm) Weight: 131 lb 9.8 oz (59.7 kg) IBW/kg (Calculated) : 52.4  Temp (24hrs), Avg:98.6 F (37 C), Min:98.2 F (36.8 C), Max:98.8 F (37.1 C)  Recent Labs  Lab 11/01/2019 1600  11/05/19 0838  2019-11-16 0426 11/16/2019 2011 11/16/2019 2218 11/07/19 0345 11/07/19 1158 11/07/19 1559 11/07/19 2156 11/04/2019 0253  WBC  --    < >  --   --  17.7*  --  12.7* 11.4*  --  10.0  --  11.4*  CREATININE  --    < >  --    < >  --  4.33*  --  4.45* 4.17*  --  3.97* 4.19*  LATICACIDVEN 7.6*  --  2.5*  --   --   --   --   --  0.9  --   --   --    < > = values in this interval not displayed.    Estimated Creatinine Clearance: 15.4 mL/min (A) (by C-G formula based on SCr of 4.19 mg/dL (H)).    No Known Allergies    Bonnita Nasuti Pharm.D. CPP, BCPS Clinical Pharmacist [phone] 11/23/2019 8:05 AM

## 2019-11-08 NOTE — Transfer of Care (Signed)
Organ Procurement

## 2019-11-08 NOTE — Progress Notes (Signed)
Recruitment maneuver performed at this time for 2 minutes with settings PC 20, RR 10, PEEP +10, FIo2 100% and I time of 3 seconds. Pt tolerated well. Unaware of order for every 4 hours. Will get pt back on schedule with 0400 vent check.

## 2019-11-08 NOTE — Anesthesia Preprocedure Evaluation (Signed)
Anesthesia Evaluation   Patient unresponsive    Reviewed: Allergy & Precautions, Patient's Chart, lab work & pertinent test results  History of Anesthesia Complications Negative for: history of anesthetic complications  Airway Mallampati: Intubated       Dental   Pulmonary neg pulmonary ROS, former smoker,    Pulmonary exam normal        Cardiovascular negative cardio ROS Normal cardiovascular exam  S/P cardio-pulmonary arrest   Neuro/Psych PSYCHIATRIC DISORDERS Anxiety Depression Bipolar Disorder Brain Death    GI/Hepatic negative GI ROS, (+)     substance abuse  IV drug use,   Endo/Other  Hypothyroidism   Renal/GU negative Renal ROS     Musculoskeletal negative musculoskeletal ROS (+)   Abdominal   Peds  Hematology negative hematology ROS (+)   Anesthesia Other Findings Day of surgery medications reviewed with the patient.  Reproductive/Obstetrics                             Anesthesia Physical Anesthesia Plan  ASA: VI and emergent  Anesthesia Plan: General   Post-op Pain Management:    Induction:   PONV Risk Score and Plan:   Airway Management Planned:   Additional Equipment:   Intra-op Plan:   Post-operative Plan:   Informed Consent:   Plan Discussed with:   Anesthesia Plan Comments:         Anesthesia Quick Evaluation

## 2019-11-08 NOTE — Progress Notes (Signed)
Recruitment performed again per CDS protocol. Pt tolerated well for entire 2 minutes.

## 2019-11-09 ENCOUNTER — Encounter (HOSPITAL_COMMUNITY): Payer: Self-pay

## 2019-11-09 LAB — PREPARE FRESH FROZEN PLASMA
Unit division: 0
Unit division: 0

## 2019-11-09 LAB — PREPARE PLATELET PHERESIS
Unit division: 0
Unit division: 0

## 2019-11-09 LAB — BPAM FFP
Blood Product Expiration Date: 202011112359
Blood Product Expiration Date: 202011112359
ISSUE DATE / TIME: 202011081536
ISSUE DATE / TIME: 202011081536
Unit Type and Rh: 600
Unit Type and Rh: 6200

## 2019-11-09 LAB — BPAM PLATELET PHERESIS
Blood Product Expiration Date: 202011102359
Blood Product Expiration Date: 202011112359
ISSUE DATE / TIME: 202011081531
ISSUE DATE / TIME: 202011081531
Unit Type and Rh: 600
Unit Type and Rh: 7300

## 2019-11-09 NOTE — Anesthesia Postprocedure Evaluation (Signed)
Anesthesia Post Note  Patient: Monica Stephens  Procedure(s) Performed: ORGAN PROCUREMENT- LIVER, KIDNEYS, (N/A )     Anesthesia Type: General Comments: PT deceased                 Karema Tocci DANIEL

## 2019-11-10 ENCOUNTER — Encounter (HOSPITAL_COMMUNITY): Payer: Self-pay

## 2019-11-10 LAB — BPAM RBC
Blood Product Expiration Date: 202011282359
Blood Product Expiration Date: 202012032359
Blood Product Expiration Date: 202012032359
Blood Product Expiration Date: 202012032359
Blood Product Expiration Date: 202012032359
Blood Product Expiration Date: 202012052359
Blood Product Expiration Date: 202012062359
Blood Product Expiration Date: 202012062359
Blood Product Expiration Date: 202012082359
Blood Product Expiration Date: 202012082359
ISSUE DATE / TIME: 202011071235
ISSUE DATE / TIME: 202011072217
ISSUE DATE / TIME: 202011081206
ISSUE DATE / TIME: 202011081341
Unit Type and Rh: 600
Unit Type and Rh: 600
Unit Type and Rh: 600
Unit Type and Rh: 600
Unit Type and Rh: 600
Unit Type and Rh: 600
Unit Type and Rh: 600
Unit Type and Rh: 600
Unit Type and Rh: 600
Unit Type and Rh: 600

## 2019-11-10 LAB — TYPE AND SCREEN
ABO/RH(D): A NEG
Antibody Screen: POSITIVE
DAT, IgG: NEGATIVE
PT AG Type: POSITIVE
Unit division: 0
Unit division: 0
Unit division: 0
Unit division: 0
Unit division: 0
Unit division: 0
Unit division: 0
Unit division: 0
Unit division: 0
Unit division: 0
Weak D: NEGATIVE

## 2019-11-10 LAB — CULTURE, BAL-QUANTITATIVE W GRAM STAIN: Culture: NO GROWTH

## 2019-11-10 LAB — SURGICAL PATHOLOGY

## 2019-11-10 LAB — CULTURE, BLOOD (ROUTINE X 2)
Culture: NO GROWTH
Culture: NO GROWTH

## 2019-11-10 LAB — HAPTOGLOBIN: Haptoglobin: <10 mg/dL — ABNORMAL LOW (ref 33–278)

## 2019-11-11 LAB — CULTURE, BAL-QUANTITATIVE W GRAM STAIN: Culture: 100 — AB

## 2019-11-11 LAB — SURGICAL PATHOLOGY

## 2019-11-12 LAB — CULTURE, RESPIRATORY W GRAM STAIN

## 2019-11-12 LAB — CULTURE, BLOOD (ROUTINE X 2)
Culture: NO GROWTH
Culture: NO GROWTH
Special Requests: ADEQUATE

## 2019-11-30 NOTE — Addendum Note (Signed)
Addended by: Orson Gear on: 11/30/2019 03:38 PM   Modules accepted: Orders

## 2019-12-01 NOTE — Progress Notes (Signed)
Chaplain provided follow-up visit with Monica Stephens who was at [facility] bedside.  Chaplain again offered support.  Chaplain provided refreshments to Monica Stephens, ensuring she is comfortable at bedside.  Chaplain will continue to follow-up.

## 2019-12-01 NOTE — Progress Notes (Signed)
Apnea test performed bedside with RN and MD at bedside. ABG obtained before and after. MD made aware of both results.

## 2019-12-01 NOTE — Progress Notes (Signed)
Pharmacy Antibiotic Note  Monica Stephens is a 38 y.o. female admitted on 11/11/2019 as a CPR in progress.  Pharmacy has been consulted for unasyn dosing for possible aspiration pneumonia.   Pt continues to have elevated WBC at 17. Temperature management per protocol. Increasing AKI with scr of 3.3. Cultures pending.    Plan: Unasyn adjusted to 3g IV Q12H F/u renal fxn, C&S, clinical status  Height: 5\' 3"  (160 cm) Weight: 128 lb 13 oz (58.4 kg)(with artic sun pads) IBW/kg (Calculated) : 52.4  Temp (24hrs), Avg:97 F (36.1 C), Min:92.8 F (33.8 C), Max:98.6 F (37 C)  Recent Labs  Lab 11/28/2019 1000 11/20/2019 1416 11/28/2019 1600 11/05/19 0202 11/05/19 0400 11/05/19 0838 11/05/19 1207 Nov 30, 2019 0012 2019-11-30 0426  WBC 21.9*  --   --   --  5.2  --   --   --  17.7*  CREATININE 1.47* 1.43*  --  2.17*  --   --  2.91* 3.34*  --   LATICACIDVEN  --   --  7.6*  --   --  2.5*  --   --   --     Estimated Creatinine Clearance: 19.3 mL/min (A) (by C-G formula based on SCr of 3.34 mg/dL (H)).    No Known Allergies  Antimicrobials this admission: Unasyn 11/4>>  Microbiology results: Pending  Thank you for allowing pharmacy to be a part of this patient's care.  Erin Hearing PharmD., BCPS Clinical Pharmacist 30-Nov-2019 9:14 AM
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
## 2019-12-01 NOTE — Progress Notes (Addendum)
Apnea test performed with RT and MD at bedside. ABG obtained before and after test. Lamonte Sakai, MD made aware of both results.  Lucius Conn, RN

## 2019-12-01 NOTE — Progress Notes (Addendum)
eLink Physician-Brief Progress Note Patient Name: [name] DOB: 07-18-1981 MRN: [id number]   Date of Service  10-Nov-2019  HPI/Events of Note  Potential organ donor.  eICU Interventions   IVF change to D5 1/2 NS for hypernatremia  Vent rate increased to 14 for hypercapnea. ABG to be repeated  FiO2 decreased to 30%  10 pack platelets ordered for platelet count of 52  1 unit PRBC ordered for H/H 7.8/23  Vasopressin ordered     Intervention Category Major Interventions: Acid-Base disturbance - evaluation and management;Electrolyte abnormality - evaluation and management  Judd Lien 2019-11-10, 10:17 PM

## 2019-12-01 NOTE — Procedures (Signed)
  PCCM Apnea Testing Note  I performed apnea testing on the patient, performed observation at bedside for the entire procedure.  There was no respiratory effort, no chest movement for the entire duration of 8 minutes disconnected from mechanical ventilation.  The patient does not have a cough reflex, gag reflex.  Pupils are fixed and dilated.  Supplemental oxygen was introduced via the patient's ET tube and SPO2 started at 98%, dropped as low as 92% but no lower.  She experienced some transient bigeminy on monitor, was never hemodynamically unstable.  She was reconnected to mechanical ventilation at the 8-minute mark after ABG was drawn.  ABG's:  7.38 / 46 / 140 at start (on 410 x 10, 0.40 + 5) >>> 7.19 / 75 / 71 after 8 minutes  Results:  This reflects a positive apnea test, no respiratory effort detected.  RT, NP, RN were all present for the entire 8 minutes as well.  No discrepancy in our observations.  Based on this test the patient is brain dead.  Time of death: 15:00 2019-12-04  Baltazar Apo, MD, PhD 04-Dec-2019, 3:04 PM [facility] [phone] or if no answer [phone]

## 2019-12-01 DEATH — deceased
